# Patient Record
Sex: Female | Born: 1950 | Race: White | Hispanic: No | Smoking: Never smoker
Health system: Southern US, Community
[De-identification: ages and names within clinical notes are randomized; demographics above are authoritative.]

## PROBLEM LIST (undated history)

## (undated) DIAGNOSIS — I10 Essential (primary) hypertension: Secondary | ICD-10-CM

## (undated) DIAGNOSIS — J45909 Unspecified asthma, uncomplicated: Secondary | ICD-10-CM

## (undated) DIAGNOSIS — M1990 Unspecified osteoarthritis, unspecified site: Secondary | ICD-10-CM

## (undated) DIAGNOSIS — E119 Type 2 diabetes mellitus without complications: Secondary | ICD-10-CM

## (undated) DIAGNOSIS — D649 Anemia, unspecified: Secondary | ICD-10-CM

## (undated) DIAGNOSIS — Z86018 Personal history of other benign neoplasm: Secondary | ICD-10-CM

## (undated) DIAGNOSIS — G629 Polyneuropathy, unspecified: Secondary | ICD-10-CM

## (undated) DIAGNOSIS — I700 Atherosclerosis of aorta: Secondary | ICD-10-CM

## (undated) DIAGNOSIS — C801 Malignant (primary) neoplasm, unspecified: Secondary | ICD-10-CM

## (undated) DIAGNOSIS — K5790 Diverticulosis of intestine, part unspecified, without perforation or abscess without bleeding: Secondary | ICD-10-CM

## (undated) DIAGNOSIS — E782 Mixed hyperlipidemia: Secondary | ICD-10-CM

## (undated) DIAGNOSIS — K649 Unspecified hemorrhoids: Secondary | ICD-10-CM

## (undated) DIAGNOSIS — Z8719 Personal history of other diseases of the digestive system: Secondary | ICD-10-CM

## (undated) DIAGNOSIS — C541 Malignant neoplasm of endometrium: Secondary | ICD-10-CM

## (undated) DIAGNOSIS — E538 Deficiency of other specified B group vitamins: Secondary | ICD-10-CM

## (undated) DIAGNOSIS — G4730 Sleep apnea, unspecified: Secondary | ICD-10-CM

## (undated) DIAGNOSIS — I2510 Atherosclerotic heart disease of native coronary artery without angina pectoris: Secondary | ICD-10-CM

## (undated) HISTORY — DX: Personal history of other benign neoplasm: Z86.018

## (undated) HISTORY — PX: COLONOSCOPY: SHX174

## (undated) HISTORY — PX: ESOPHAGOGASTRODUODENOSCOPY: SHX1529

## (undated) HISTORY — PX: APPENDECTOMY: SHX54

## (undated) MED FILL — Fosaprepitant Dimeglumine For IV Infusion 150 MG (Base Eq): INTRAVENOUS | Qty: 5 | Status: AC

## (undated) MED FILL — Dexamethasone Sodium Phosphate Inj 100 MG/10ML: INTRAMUSCULAR | Qty: 1 | Status: AC

---
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
## 2004-05-09 ENCOUNTER — Ambulatory Visit: Payer: Self-pay

## 2004-11-04 ENCOUNTER — Encounter: Payer: Self-pay | Admitting: Physician Assistant

## 2004-11-15 ENCOUNTER — Encounter: Payer: Self-pay | Admitting: Physician Assistant

## 2004-12-15 ENCOUNTER — Encounter: Payer: Self-pay | Admitting: Physician Assistant

## 2005-01-15 ENCOUNTER — Encounter: Payer: Self-pay | Admitting: Physician Assistant

## 2005-02-26 ENCOUNTER — Ambulatory Visit: Payer: Self-pay | Admitting: Unknown Physician Specialty

## 2005-06-06 ENCOUNTER — Ambulatory Visit: Payer: Self-pay

## 2006-04-10 ENCOUNTER — Ambulatory Visit: Payer: Self-pay

## 2007-06-04 ENCOUNTER — Ambulatory Visit: Payer: Self-pay

## 2008-10-15 ENCOUNTER — Ambulatory Visit: Payer: Self-pay | Admitting: Internal Medicine

## 2008-11-03 ENCOUNTER — Ambulatory Visit: Payer: Self-pay | Admitting: Internal Medicine

## 2008-11-15 ENCOUNTER — Ambulatory Visit: Payer: Self-pay | Admitting: Internal Medicine

## 2008-12-15 ENCOUNTER — Ambulatory Visit: Payer: Self-pay | Admitting: Internal Medicine

## 2008-12-20 ENCOUNTER — Ambulatory Visit: Payer: Self-pay | Admitting: Urology

## 2009-02-14 ENCOUNTER — Ambulatory Visit: Payer: Self-pay | Admitting: Internal Medicine

## 2009-02-15 ENCOUNTER — Ambulatory Visit: Payer: Self-pay | Admitting: Internal Medicine

## 2009-03-17 ENCOUNTER — Ambulatory Visit: Payer: Self-pay | Admitting: Internal Medicine

## 2009-04-06 ENCOUNTER — Ambulatory Visit: Payer: Self-pay | Admitting: Unknown Physician Specialty

## 2009-04-17 ENCOUNTER — Ambulatory Visit: Payer: Self-pay | Admitting: Internal Medicine

## 2009-05-15 ENCOUNTER — Ambulatory Visit: Payer: Self-pay | Admitting: Internal Medicine

## 2009-06-15 ENCOUNTER — Ambulatory Visit: Payer: Self-pay | Admitting: Internal Medicine

## 2009-07-15 ENCOUNTER — Ambulatory Visit: Payer: Self-pay | Admitting: Internal Medicine

## 2009-09-14 ENCOUNTER — Ambulatory Visit: Payer: Self-pay | Admitting: Internal Medicine

## 2009-10-02 ENCOUNTER — Ambulatory Visit: Payer: Self-pay | Admitting: Internal Medicine

## 2009-10-15 ENCOUNTER — Ambulatory Visit: Payer: Self-pay | Admitting: Internal Medicine

## 2010-01-08 ENCOUNTER — Ambulatory Visit: Payer: Self-pay | Admitting: Internal Medicine

## 2010-01-15 ENCOUNTER — Ambulatory Visit: Payer: Self-pay | Admitting: Internal Medicine

## 2010-02-14 ENCOUNTER — Ambulatory Visit: Payer: Self-pay | Admitting: Internal Medicine

## 2010-04-09 ENCOUNTER — Ambulatory Visit: Payer: Self-pay | Admitting: Internal Medicine

## 2010-04-17 ENCOUNTER — Ambulatory Visit: Payer: Self-pay | Admitting: Internal Medicine

## 2010-07-09 ENCOUNTER — Ambulatory Visit: Payer: Self-pay | Admitting: Internal Medicine

## 2010-07-16 DIAGNOSIS — Z86018 Personal history of other benign neoplasm: Secondary | ICD-10-CM

## 2010-07-16 HISTORY — DX: Personal history of other benign neoplasm: Z86.018

## 2010-10-08 ENCOUNTER — Ambulatory Visit: Payer: Self-pay | Admitting: Internal Medicine

## 2010-10-16 ENCOUNTER — Ambulatory Visit: Payer: Self-pay | Admitting: Internal Medicine

## 2010-12-31 ENCOUNTER — Ambulatory Visit: Payer: Self-pay | Admitting: Internal Medicine

## 2011-01-16 ENCOUNTER — Ambulatory Visit: Payer: Self-pay | Admitting: Internal Medicine

## 2011-03-25 ENCOUNTER — Ambulatory Visit: Payer: Self-pay | Admitting: Internal Medicine

## 2011-03-25 LAB — CBC CANCER CENTER
Basophil %: 0.5 %
Eosinophil #: 0.3 x10 3/mm (ref 0.0–0.7)
Eosinophil %: 3.4 %
HCT: 36.4 % (ref 35.0–47.0)
Lymphocyte #: 3.3 x10 3/mm (ref 1.0–3.6)
Lymphocyte %: 32.7 %
MCV: 88 fL (ref 80–100)
Monocyte #: 0.7 x10 3/mm (ref 0.0–0.7)
Monocyte %: 6.7 %
Neutrophil #: 5.7 x10 3/mm (ref 1.4–6.5)
Neutrophil %: 56.7 %
RBC: 4.15 10*6/uL (ref 3.80–5.20)
WBC: 10 x10 3/mm (ref 3.6–11.0)

## 2011-03-25 LAB — IRON AND TIBC
Iron Saturation: 24 %
Unbound Iron-Bind.Cap.: 295 ug/dL

## 2011-04-18 ENCOUNTER — Ambulatory Visit: Payer: Self-pay | Admitting: Internal Medicine

## 2011-09-23 ENCOUNTER — Ambulatory Visit: Payer: Self-pay | Admitting: Internal Medicine

## 2011-09-23 LAB — CBC CANCER CENTER
Basophil #: 0 x10 3/mm (ref 0.0–0.1)
Basophil %: 0.5 %
Eosinophil %: 4.9 %
HCT: 37 % (ref 35.0–47.0)
HGB: 11.8 g/dL — ABNORMAL LOW (ref 12.0–16.0)
Lymphocyte #: 2.6 x10 3/mm (ref 1.0–3.6)
Lymphocyte %: 27.8 %
MCH: 28.7 pg (ref 26.0–34.0)
MCV: 90 fL (ref 80–100)
Monocyte #: 0.7 x10 3/mm (ref 0.2–0.9)
Monocyte %: 7.9 %
Neutrophil #: 5.6 x10 3/mm (ref 1.4–6.5)
Platelet: 286 x10 3/mm (ref 150–440)
RBC: 4.12 10*6/uL (ref 3.80–5.20)
RDW: 13.2 % (ref 11.5–14.5)
WBC: 9.4 x10 3/mm (ref 3.6–11.0)

## 2011-10-16 ENCOUNTER — Ambulatory Visit: Payer: Self-pay | Admitting: Internal Medicine

## 2012-01-09 ENCOUNTER — Ambulatory Visit: Payer: Self-pay | Admitting: Internal Medicine

## 2012-01-09 LAB — CANCER CENTER HEMOGLOBIN: HGB: 11.8 g/dL — ABNORMAL LOW (ref 12.0–16.0)

## 2012-01-09 LAB — IRON AND TIBC: Iron Saturation: 21 %

## 2012-01-09 LAB — FERRITIN: Ferritin (ARMC): 84 ng/mL (ref 8–388)

## 2012-01-16 ENCOUNTER — Ambulatory Visit: Payer: Self-pay | Admitting: Internal Medicine

## 2012-03-09 ENCOUNTER — Ambulatory Visit: Payer: Self-pay | Admitting: Internal Medicine

## 2012-03-17 ENCOUNTER — Ambulatory Visit: Payer: Self-pay | Admitting: Internal Medicine

## 2012-05-15 ENCOUNTER — Ambulatory Visit: Payer: Self-pay | Admitting: Internal Medicine

## 2012-06-08 LAB — CBC CANCER CENTER
Basophil #: 0.1 x10 3/mm (ref 0.0–0.1)
Basophil %: 0.7 %
Eosinophil #: 0.4 x10 3/mm (ref 0.0–0.7)
Eosinophil %: 4.3 %
Lymphocyte #: 3.2 x10 3/mm (ref 1.0–3.6)
Lymphocyte %: 30.9 %
MCH: 29.5 pg (ref 26.0–34.0)
MCHC: 33.7 g/dL (ref 32.0–36.0)
MCV: 88 fL (ref 80–100)
Monocyte #: 0.7 x10 3/mm (ref 0.2–0.9)
Monocyte %: 7.1 %
Neutrophil #: 5.9 x10 3/mm (ref 1.4–6.5)
Platelet: 310 x10 3/mm (ref 150–440)
RDW: 13.4 % (ref 11.5–14.5)
WBC: 10.4 x10 3/mm (ref 3.6–11.0)

## 2012-06-08 LAB — IRON AND TIBC
Iron Bind.Cap.(Total): 403 ug/dL (ref 250–450)
Unbound Iron-Bind.Cap.: 319 ug/dL

## 2012-06-08 LAB — FERRITIN: Ferritin (ARMC): 68 ng/mL (ref 8–388)

## 2012-06-15 ENCOUNTER — Ambulatory Visit: Payer: Self-pay | Admitting: Internal Medicine

## 2014-12-16 DEATH — deceased

## 2015-02-22 ENCOUNTER — Other Ambulatory Visit: Payer: Self-pay | Admitting: Internal Medicine

## 2015-02-22 DIAGNOSIS — N644 Mastodynia: Secondary | ICD-10-CM

## 2015-03-07 ENCOUNTER — Other Ambulatory Visit: Payer: Self-pay | Admitting: Internal Medicine

## 2015-03-07 ENCOUNTER — Ambulatory Visit
Admission: RE | Admit: 2015-03-07 | Discharge: 2015-03-07 | Disposition: A | Discharge status: Home / Self Care | Payer: BC Managed Care – PPO | Source: Ambulatory Visit | Attending: Internal Medicine | Admitting: Internal Medicine

## 2015-03-07 DIAGNOSIS — N644 Mastodynia: Secondary | ICD-10-CM

## 2015-10-16 ENCOUNTER — Other Ambulatory Visit: Payer: Self-pay | Admitting: Internal Medicine

## 2015-10-16 ENCOUNTER — Ambulatory Visit
Admission: RE | Admit: 2015-10-16 | Discharge: 2015-10-16 | Disposition: A | Discharge status: Home / Self Care | Payer: BC Managed Care – PPO | Source: Ambulatory Visit | Attending: Internal Medicine | Admitting: Internal Medicine

## 2015-10-16 DIAGNOSIS — R1011 Right upper quadrant pain: Secondary | ICD-10-CM | POA: Insufficient documentation

## 2015-10-16 DIAGNOSIS — K760 Fatty (change of) liver, not elsewhere classified: Secondary | ICD-10-CM | POA: Insufficient documentation

## 2016-06-03 ENCOUNTER — Other Ambulatory Visit: Payer: Self-pay | Admitting: Internal Medicine

## 2016-06-03 DIAGNOSIS — Z1231 Encounter for screening mammogram for malignant neoplasm of breast: Secondary | ICD-10-CM

## 2016-07-02 ENCOUNTER — Ambulatory Visit
Admission: RE | Admit: 2016-07-02 | Discharge: 2016-07-02 | Disposition: A | Discharge status: Home / Self Care | Payer: BC Managed Care – PPO | Source: Ambulatory Visit | Attending: Internal Medicine | Admitting: Internal Medicine

## 2016-07-02 DIAGNOSIS — Z1231 Encounter for screening mammogram for malignant neoplasm of breast: Secondary | ICD-10-CM | POA: Diagnosis not present

## 2017-10-08 ENCOUNTER — Other Ambulatory Visit: Payer: Self-pay | Admitting: Internal Medicine

## 2017-10-08 DIAGNOSIS — Z1231 Encounter for screening mammogram for malignant neoplasm of breast: Secondary | ICD-10-CM

## 2017-10-28 ENCOUNTER — Ambulatory Visit
Admission: RE | Admit: 2017-10-28 | Discharge: 2017-10-28 | Disposition: A | Discharge status: Home / Self Care | Payer: Medicare Other | Source: Ambulatory Visit | Attending: Internal Medicine | Admitting: Internal Medicine

## 2017-10-28 DIAGNOSIS — Z1231 Encounter for screening mammogram for malignant neoplasm of breast: Secondary | ICD-10-CM | POA: Diagnosis present

## 2018-01-18 ENCOUNTER — Other Ambulatory Visit: Payer: Self-pay | Admitting: Internal Medicine

## 2018-01-18 DIAGNOSIS — M5441 Lumbago with sciatica, right side: Secondary | ICD-10-CM

## 2018-01-18 DIAGNOSIS — M5416 Radiculopathy, lumbar region: Secondary | ICD-10-CM

## 2018-01-18 DIAGNOSIS — M25552 Pain in left hip: Secondary | ICD-10-CM

## 2018-01-18 DIAGNOSIS — M5442 Lumbago with sciatica, left side: Secondary | ICD-10-CM

## 2018-01-21 ENCOUNTER — Other Ambulatory Visit: Payer: Self-pay | Admitting: Internal Medicine

## 2018-01-21 DIAGNOSIS — M5442 Lumbago with sciatica, left side: Secondary | ICD-10-CM

## 2018-01-21 DIAGNOSIS — M5441 Lumbago with sciatica, right side: Secondary | ICD-10-CM

## 2018-01-21 DIAGNOSIS — M25552 Pain in left hip: Secondary | ICD-10-CM

## 2018-01-21 DIAGNOSIS — M5416 Radiculopathy, lumbar region: Secondary | ICD-10-CM

## 2018-02-09 ENCOUNTER — Ambulatory Visit
Admission: RE | Admit: 2018-02-09 | Discharge: 2018-02-09 | Disposition: A | Discharge status: Home / Self Care | Payer: Medicare Other | Source: Ambulatory Visit | Attending: Internal Medicine | Admitting: Internal Medicine

## 2018-02-09 ENCOUNTER — Encounter (INDEPENDENT_AMBULATORY_CARE_PROVIDER_SITE_OTHER): Payer: Self-pay

## 2018-02-09 DIAGNOSIS — M5126 Other intervertebral disc displacement, lumbar region: Secondary | ICD-10-CM | POA: Insufficient documentation

## 2018-02-09 DIAGNOSIS — M48061 Spinal stenosis, lumbar region without neurogenic claudication: Secondary | ICD-10-CM | POA: Insufficient documentation

## 2018-02-09 DIAGNOSIS — M5441 Lumbago with sciatica, right side: Secondary | ICD-10-CM

## 2018-02-09 DIAGNOSIS — M5442 Lumbago with sciatica, left side: Secondary | ICD-10-CM

## 2018-02-09 DIAGNOSIS — M5416 Radiculopathy, lumbar region: Secondary | ICD-10-CM

## 2018-02-09 DIAGNOSIS — M25552 Pain in left hip: Secondary | ICD-10-CM

## 2018-02-09 HISTORY — DX: Essential (primary) hypertension: I10

## 2018-02-09 HISTORY — DX: Type 2 diabetes mellitus without complications: E11.9

## 2018-02-09 MED ORDER — GADOBUTROL 1 MMOL/ML IV SOLN
10.0000 mL | Freq: Once | INTRAVENOUS | Status: AC | PRN
  Administered 2018-02-09: 10 mL via INTRAVENOUS

## 2018-12-13 ENCOUNTER — Other Ambulatory Visit: Payer: Self-pay | Admitting: Internal Medicine

## 2018-12-13 DIAGNOSIS — Z1231 Encounter for screening mammogram for malignant neoplasm of breast: Secondary | ICD-10-CM

## 2018-12-17 ENCOUNTER — Other Ambulatory Visit: Payer: Self-pay | Admitting: Internal Medicine

## 2018-12-17 DIAGNOSIS — N644 Mastodynia: Secondary | ICD-10-CM

## 2018-12-21 ENCOUNTER — Other Ambulatory Visit: Payer: Self-pay | Admitting: Internal Medicine

## 2018-12-21 DIAGNOSIS — N644 Mastodynia: Secondary | ICD-10-CM

## 2018-12-21 DIAGNOSIS — Z1231 Encounter for screening mammogram for malignant neoplasm of breast: Secondary | ICD-10-CM

## 2018-12-24 ENCOUNTER — Ambulatory Visit
Admission: RE | Admit: 2018-12-24 | Discharge: 2018-12-24 | Disposition: A | Discharge status: Home / Self Care | Payer: Medicare Other | Source: Ambulatory Visit | Attending: Internal Medicine | Admitting: Internal Medicine

## 2018-12-24 DIAGNOSIS — N644 Mastodynia: Secondary | ICD-10-CM

## 2018-12-24 DIAGNOSIS — Z1231 Encounter for screening mammogram for malignant neoplasm of breast: Secondary | ICD-10-CM | POA: Diagnosis present

## 2019-11-30 ENCOUNTER — Ambulatory Visit: Payer: Medicare PPO | Admitting: Dermatology

## 2019-11-30 ENCOUNTER — Other Ambulatory Visit: Payer: Self-pay

## 2019-11-30 ENCOUNTER — Encounter: Payer: Self-pay | Admitting: Dermatology

## 2019-11-30 DIAGNOSIS — L578 Other skin changes due to chronic exposure to nonionizing radiation: Secondary | ICD-10-CM | POA: Diagnosis not present

## 2019-11-30 DIAGNOSIS — L820 Inflamed seborrheic keratosis: Secondary | ICD-10-CM | POA: Diagnosis not present

## 2019-11-30 DIAGNOSIS — L821 Other seborrheic keratosis: Secondary | ICD-10-CM

## 2019-11-30 DIAGNOSIS — D229 Melanocytic nevi, unspecified: Secondary | ICD-10-CM | POA: Diagnosis not present

## 2019-11-30 NOTE — Progress Notes (Signed)
   Follow-Up Visit   Subjective  Valerie Case is a 69 y.o. female who presents for the following: check spot (back, ~89m, tender). Other spots to be checked too.  The following portions of the chart were reviewed this encounter and updated as appropriate:  Allergies  Meds  Problems  Med Hx  Surg Hx  Fam Hx     Review of Systems:  No other skin or systemic complaints except as noted in HPI or Assessment and Plan.  Objective  Well appearing patient in no apparent distress; mood and affect are within normal limits.  A focused examination was performed including back. Relevant physical exam findings are noted in the Assessment and Plan.  Objective  L back x 1: Erythematous keratotic or waxy stuck-on papule or plaque.    Assessment & Plan    Melanocytic Nevi - Tan-brown and/or pink-flesh-colored symmetric macules and papules - Benign appearing on exam today - Observation - Call clinic for new or changing moles - Recommend daily use of broad spectrum spf 30+ sunscreen to sun-exposed areas.   Seborrheic Keratoses - Stuck-on, waxy, tan-brown papules and plaques  - Discussed benign etiology and prognosis. - Observe - Call for any changes  Actinic Damage - diffuse scaly erythematous macules with underlying dyspigmentation - Recommend daily broad spectrum sunscreen SPF 30+ to sun-exposed areas, reapply every 2 hours as needed.  - Call for new or changing lesions.  Inflamed seborrheic keratosis L back x 1  Destruction of lesion - L back x 1 Complexity: simple   Destruction method: cryotherapy   Informed consent: discussed and consent obtained   Timeout:  patient name, date of birth, surgical site, and procedure verified Lesion destroyed using liquid nitrogen: Yes   Region frozen until ice ball extended beyond lesion: Yes   Outcome: patient tolerated procedure well with no complications   Post-procedure details: wound care instructions given    Destruction of  lesion - L back x 1   Return in about 1 year (around 11/29/2020) for TBSE.   I, Othelia Pulling, RMA, am acting as scribe for Sarina Ser, MD .  Documentation: I have reviewed the above documentation for accuracy and completeness, and I agree with the above.  Sarina Ser, MD

## 2019-11-30 NOTE — Patient Instructions (Signed)

## 2019-12-26 ENCOUNTER — Other Ambulatory Visit: Payer: Self-pay

## 2019-12-26 ENCOUNTER — Ambulatory Visit
Admission: RE | Admit: 2019-12-26 | Discharge: 2019-12-26 | Disposition: A | Discharge status: Home / Self Care | Payer: Medicare PPO | Source: Ambulatory Visit | Attending: Internal Medicine | Admitting: Internal Medicine

## 2019-12-26 ENCOUNTER — Other Ambulatory Visit (HOSPITAL_COMMUNITY): Payer: Self-pay | Admitting: Internal Medicine

## 2019-12-26 ENCOUNTER — Other Ambulatory Visit: Payer: Self-pay | Admitting: Internal Medicine

## 2019-12-26 ENCOUNTER — Ambulatory Visit: Admission: RE | Admit: 2019-12-26 | Payer: Medicare PPO | Source: Ambulatory Visit

## 2019-12-26 DIAGNOSIS — M79605 Pain in left leg: Secondary | ICD-10-CM | POA: Diagnosis present

## 2019-12-26 DIAGNOSIS — M7989 Other specified soft tissue disorders: Secondary | ICD-10-CM

## 2020-02-22 ENCOUNTER — Ambulatory Visit (INDEPENDENT_AMBULATORY_CARE_PROVIDER_SITE_OTHER): Payer: Medicare PPO

## 2020-02-22 ENCOUNTER — Ambulatory Visit
Admission: EM | Admit: 2020-02-22 | Discharge: 2020-02-22 | Disposition: A | Discharge status: Home / Self Care | Payer: Medicare PPO | Attending: Family Medicine | Admitting: Family Medicine

## 2020-02-22 ENCOUNTER — Other Ambulatory Visit: Payer: Self-pay

## 2020-02-22 ENCOUNTER — Encounter: Payer: Self-pay | Admitting: Emergency Medicine

## 2020-02-22 DIAGNOSIS — W19XXXA Unspecified fall, initial encounter: Secondary | ICD-10-CM

## 2020-02-22 DIAGNOSIS — M25512 Pain in left shoulder: Secondary | ICD-10-CM

## 2020-02-22 HISTORY — DX: Unspecified asthma, uncomplicated: J45.909

## 2020-02-22 MED ORDER — TRAMADOL HCL 50 MG PO TABS: 50.0000 mg | ORAL_TABLET | Freq: Three times a day (TID) | ORAL | 0 refills | Status: DC | PRN

## 2020-02-22 NOTE — ED Provider Notes (Signed)
[facility]    CSN: [id number] Arrival date & time: 02/22/20  1126  History   Chief Complaint Chief Complaint  Patient presents with  . Fall  . Shoulder Pain    left   HPI   69 year old female presents with the above complaint.  Patient states that she was coming back into the house after getting the mail.  She states that she somehow tripped on hardwood floor.  She fell on her left side and injured her left shoulder.  She reports 9/10 pain left shoulder.  Described as aching.  No relieving factors.  Exacerbated by palpation and activity/range of motion.  No medications tried.  She is currently in a makeshift sling.  Past Medical History:  Diagnosis Date  . Asthma   . Diabetes mellitus without complication ([place])   . Hx of dysplastic nevus 07/16/2010   RLQA  . Hypertension    Home Medications    Prior to Admission medications   Medication Sig Start Date End Date Taking? Authorizing Provider  acetaminophen (TYLENOL) 650 MG CR tablet Take by mouth.   Yes [provider]  empagliflozin (JARDIANCE) 25 MG TABS tablet Take 1 tablet by mouth daily. 09/06/19  Yes [provider]  ferrous sulfate 325 (65 FE) MG EC tablet TAKE 1 TABLET BY MOUTH EVERY DAY WITH BREAKFAST 08/29/19  Yes [provider]  FLUoxetine (PROZAC) 10 MG capsule  10/03/19  Yes [provider]  hydrochlorothiazide (HYDRODIURIL) 25 MG tablet  07/27/19  Yes [provider]  lisinopril (ZESTRIL) 40 MG tablet  09/26/19  Yes [provider]  metFORMIN (GLUCOPHAGE-XR) 500 MG 24 hr tablet  11/21/19  Yes [provider]  metoprolol succinate (TOPROL-XL) 25 MG 24 hr tablet  09/22/19  Yes [provider]  NOVOLIN 70/30 FLEXPEN (70-30) 100 UNIT/ML KwikPen  11/11/19  Yes [provider]  [place], 1 MG/DOSE, 4 MG/3ML SOPN  09/27/19  Yes [provider]  rosuvastatin (CRESTOR) 10 MG tablet  09/26/19  Yes [provider]  traMADol  (ULTRAM) 50 MG tablet Take 1 tablet (50 mg total) by mouth every 8 (eight) hours as needed. 02/22/20   Coral Spikes, DO    Family History Family History  Problem Relation Age of Onset  . Breast cancer Mother 34  . Cancer Mother   . Cancer Father     Social History Social History   Tobacco Use  . Smoking status: Never Smoker  . Smokeless tobacco: Never Used  Vaping Use  . Vaping Use: Never used  Substance Use Topics  . Alcohol use: Never  . Drug use: Never     Allergies   Aspirin   Review of Systems Review of Systems  Musculoskeletal:       Left shoulder pain.   Physical Exam Triage Vital Signs ED Triage Vitals  Enc Vitals Group     BP 02/22/20 1235 140/64     Pulse Rate 02/22/20 1235 85     Resp 02/22/20 1235 18     Temp 02/22/20 1235 98.2 F (36.8 C)     Temp Source 02/22/20 1235 Oral     SpO2 02/22/20 1235 98 %     Weight 02/22/20 1233 300 lb (136.1 kg)     Height 02/22/20 1233 5\' 7"  (1.702 m)     Head Circumference --      Peak Flow --      Pain Score 02/22/20 1232 9     Pain Loc --  Pain Edu? --      Excl. in [place]? --    Updated Vital Signs BP 140/64 (BP Location: Right Arm)   Pulse 85   Temp 98.2 F (36.8 C) (Oral)   Resp 18   Ht 5\' 7"  (1.702 m)   Wt 136.1 kg   SpO2 98%   BMI 46.99 kg/m   Visual Acuity Right Eye Distance:   Left Eye Distance:   Bilateral Distance:    Right Eye Near:   Left Eye Near:    Bilateral Near:     Physical Exam Vitals and nursing note reviewed.  Constitutional:      General: She is not in acute distress.    Appearance: She is obese. She is not ill-appearing.  HENT:     Head: Normocephalic and atraumatic.  Cardiovascular:     Rate and Rhythm: Normal rate and regular rhythm.     Heart sounds: Murmur heard.   Pulmonary:     Effort: Pulmonary effort is normal.     Breath sounds: Normal breath sounds. No wheezing, rhonchi or rales.  Musculoskeletal:     Comments: Left shoulder - tenderness to  palpation diffusely.   Neurological:     Mental Status: She is alert.  Psychiatric:        Mood and Affect: Mood normal.        Behavior: Behavior normal.    UC Treatments / Results  Labs (all labs ordered are listed, but only abnormal results are displayed) Labs Reviewed - No data to display  EKG   Radiology DG Shoulder Left  Result Date: 02/22/2020 CLINICAL DATA:  Left shoulder pain after fall today. EXAM: LEFT SHOULDER - 2+ VIEW COMPARISON:  None. FINDINGS: There is no evidence of fracture or dislocation. There is no evidence of arthropathy or other focal bone abnormality. Soft tissues are unremarkable. IMPRESSION: Negative. Electronically Signed   By: Marijo Conception M.D.   On: 02/22/2020 13:45    Procedures Procedures (including critical care time)  Medications Ordered in UC Medications - No data to display  Initial Impression / Assessment and Plan / UC Course  I have reviewed the triage vital signs and the nursing notes.  Pertinent labs & imaging results that were available during my care of the patient were reviewed by me and considered in my medical decision making (see chart for details).    69 year old female presents with right shoulder pain.  Patient suffered a fall today.  X-ray was obtained and was independent reviewed by me.  Interpretation: Normal x-ray.  No evidence of fracture.  Advised rest, ice.  Gentle range of motion.  Tramadol as needed for pain.  Supportive care.  Final Clinical Impressions(s) / UC Diagnoses   Final diagnoses:  Acute pain of left shoulder     Discharge Instructions     Rest, ice.   Medication as needed.  Gentle range of motion.  Take care  Dr. Lacinda Axon    ED Prescriptions    Medication Sig Dispense Auth. Provider   traMADol (ULTRAM) 50 MG tablet Take 1 tablet (50 mg total) by mouth every 8 (eight) hours as needed. 10 tablet Thersa Salt G, DO     I have reviewed the PDMP during this encounter.   Coral Spikes,  [place] 02/22/20 1902

## 2020-02-22 NOTE — ED Triage Notes (Signed)
Patient states she fell this morning. She states she tripped and fell on her left side. She is c/o left shoulder pain.

## 2020-02-22 NOTE — Discharge Instructions (Signed)
Rest, ice.   Medication as needed.  Gentle range of motion.  Take care  Dr. Lacinda Axon

## 2020-04-30 ENCOUNTER — Other Ambulatory Visit: Payer: Self-pay | Admitting: Internal Medicine

## 2020-04-30 DIAGNOSIS — Z1231 Encounter for screening mammogram for malignant neoplasm of breast: Secondary | ICD-10-CM

## 2020-11-29 ENCOUNTER — Encounter: Payer: Medicare PPO | Admitting: Dermatology

## 2020-12-07 ENCOUNTER — Ambulatory Visit (INDEPENDENT_AMBULATORY_CARE_PROVIDER_SITE_OTHER): Payer: Medicare PPO

## 2020-12-07 ENCOUNTER — Encounter: Payer: Self-pay | Admitting: Emergency Medicine

## 2020-12-07 ENCOUNTER — Other Ambulatory Visit: Payer: Self-pay

## 2020-12-07 ENCOUNTER — Ambulatory Visit
Admission: EM | Admit: 2020-12-07 | Discharge: 2020-12-07 | Disposition: A | Discharge status: Home / Self Care | Payer: Medicare PPO | Attending: Family Medicine | Admitting: Family Medicine

## 2020-12-07 DIAGNOSIS — R109 Unspecified abdominal pain: Secondary | ICD-10-CM | POA: Insufficient documentation

## 2020-12-07 DIAGNOSIS — R319 Hematuria, unspecified: Secondary | ICD-10-CM | POA: Diagnosis present

## 2020-12-07 LAB — CBC WITH DIFFERENTIAL/PLATELET
Abs Immature Granulocytes: 0.03 10*3/uL (ref 0.00–0.07)
Basophils Absolute: 0.1 10*3/uL (ref 0.0–0.1)
Basophils Relative: 1 %
Eosinophils Absolute: 0.3 10*3/uL (ref 0.0–0.5)
Eosinophils Relative: 4 %
HCT: 38.6 % (ref 36.0–46.0)
Hemoglobin: 12.6 g/dL (ref 12.0–15.0)
Immature Granulocytes: 0 %
Lymphocytes Relative: 32 %
Lymphs Abs: 2.7 10*3/uL (ref 0.7–4.0)
MCH: 29.1 pg (ref 26.0–34.0)
MCHC: 32.6 g/dL (ref 30.0–36.0)
MCV: 89.1 fL (ref 80.0–100.0)
Monocytes Absolute: 0.7 10*3/uL (ref 0.1–1.0)
Monocytes Relative: 9 %
Neutro Abs: 4.6 10*3/uL (ref 1.7–7.7)
Neutrophils Relative %: 54 %
Platelets: 297 10*3/uL (ref 150–400)
RBC: 4.33 MIL/uL (ref 3.87–5.11)
RDW: 13.4 % (ref 11.5–15.5)
WBC: 8.5 10*3/uL (ref 4.0–10.5)
nRBC: 0 % (ref 0.0–0.2)

## 2020-12-07 LAB — COMPREHENSIVE METABOLIC PANEL
ALT: 18 U/L (ref 0–44)
AST: 22 U/L (ref 15–41)
Albumin: 4.2 g/dL (ref 3.5–5.0)
Alkaline Phosphatase: 54 U/L (ref 38–126)
Anion gap: 10 (ref 5–15)
BUN: 25 mg/dL — ABNORMAL HIGH (ref 8–23)
CO2: 21 mmol/L — ABNORMAL LOW (ref 22–32)
Calcium: 9.7 mg/dL (ref 8.9–10.3)
Chloride: 104 mmol/L (ref 98–111)
Creatinine, Ser: 0.9 mg/dL (ref 0.44–1.00)
GFR, Estimated: >60 mL/min (ref >60)
Glucose, Bld: 133 mg/dL — ABNORMAL HIGH (ref 70–99)
Potassium: 4.2 mmol/L (ref 3.5–5.1)
Sodium: 135 mmol/L (ref 135–145)
Total Bilirubin: 0.5 mg/dL (ref 0.3–1.2)
Total Protein: 7.8 g/dL (ref 6.5–8.1)

## 2020-12-07 LAB — URINALYSIS, COMPLETE (UACMP) WITH MICROSCOPIC
Bilirubin Urine: NEGATIVE
Glucose, UA: 500 mg/dL — AB
Ketones, ur: NEGATIVE mg/dL
Leukocytes,Ua: NEGATIVE
Nitrite: NEGATIVE
Protein, ur: NEGATIVE mg/dL
RBC / HPF: >50 RBC/hpf (ref 0–5)
Specific Gravity, Urine: 1.01 (ref 1.005–1.030)
pH: 5.5 (ref 5.0–8.0)

## 2020-12-07 MED ORDER — TRAMADOL HCL 50 MG PO TABS: 50.0000 mg | ORAL_TABLET | Freq: Four times a day (QID) | ORAL | 0 refills | Status: DC | PRN

## 2020-12-07 NOTE — ED Triage Notes (Signed)
Patient c/o lower abdominal and lower back pain that started yesterday.  Patient reports blood in her urine.  Patient also thinks she might be constipated.  Patient states that she passed a blood clot about a hour ago.

## 2020-12-07 NOTE — ED Provider Notes (Signed)
[facility]    CSN: [id number] Arrival date & time: 12/07/20  1545      History   Chief Complaint Chief Complaint  Patient presents with   Dysuria    HPI Valerie Case is a 70 y.o. female.   HPI  70 year old female here for evaluation of hematuria.  Patient reports that she has been experiencing lower abdomen and low back pain since August.  She is also been experiencing blood in her urine.  She reports that today she passed a clot approximate hour before she came in.  She states that she feels like she also may be constipated.  She states that the last month and a half when she eats she becomes bloated and small meals lead to a sense of fullness and lower abdominal pain.  She also has some nausea but no vomiting.  She has not had any fever and she denies pain with urination.  Patient has no history of smoking but her parents were both smokers.  She is a diabetic on Jardiance.  She reports that since August she has been treated twice by her primary care doctor for urinary tract infections.  Her urinalysis from 10/31/20 showed few bacteria but no other signs of infection.  A culture was performed which grew out E. coli..  Patient has not had any imaging of her abdomen.  Patient also denies urinary urgency or frequency.  Past Medical History:  Diagnosis Date   Asthma    Diabetes mellitus without complication ([place])    Hx of dysplastic nevus 07/16/2010   RLQA   Hypertension     There are no problems to display for this patient.   History reviewed. No pertinent surgical history.  OB History   No obstetric history on file.      Home Medications    Prior to Admission medications   Medication Sig Start Date End Date Taking? Authorizing Provider  empagliflozin (JARDIANCE) 25 MG TABS tablet Take 1 tablet by mouth daily. 09/06/19  Yes [provider]  ferrous sulfate 325 (65 FE) MG EC tablet TAKE 1 TABLET BY MOUTH EVERY DAY WITH BREAKFAST 08/29/19  Yes  [provider]  FLUoxetine (PROZAC) 10 MG capsule  10/03/19  Yes [provider]  hydrochlorothiazide (HYDRODIURIL) 25 MG tablet  07/27/19  Yes [provider]  lisinopril (ZESTRIL) 40 MG tablet  09/26/19  Yes [provider]  metFORMIN (GLUCOPHAGE-XR) 500 MG 24 hr tablet  11/21/19  Yes [provider]  metoprolol succinate (TOPROL-XL) 25 MG 24 hr tablet  09/22/19  Yes [provider]  NOVOLIN 70/30 FLEXPEN (70-30) 100 UNIT/ML KwikPen  11/11/19  Yes [provider]  [place], 1 MG/DOSE, 4 MG/3ML SOPN  09/27/19  Yes [provider]  rosuvastatin (CRESTOR) 10 MG tablet  09/26/19  Yes [provider]  traMADol (ULTRAM) 50 MG tablet Take 1 tablet (50 mg total) by mouth every 6 (six) hours as needed. 12/07/20  Yes Margarette Canada, NP  acetaminophen (TYLENOL) 650 MG CR tablet Take by mouth.    [provider]    Family History Family History  Problem Relation Age of Onset   Breast cancer Mother 42   Cancer Mother    Cancer Father     Social History Social History   Tobacco Use   Smoking status: Never   Smokeless tobacco: Never  Vaping Use   Vaping Use: Never used  Substance Use Topics   Alcohol use: Never   Drug  use: Never     Allergies   Aspirin   Review of Systems Review of Systems  Constitutional:  Negative for activity change, appetite change and fever.  Gastrointestinal:  Positive for abdominal pain, constipation and nausea. Negative for diarrhea and vomiting.  Genitourinary:  Positive for hematuria. Negative for dysuria, frequency and urgency.  Musculoskeletal:  Positive for back pain.  Hematological: Negative.   Psychiatric/Behavioral: Negative.      Physical Exam Triage Vital Signs ED Triage Vitals  Enc Vitals Group     BP 12/07/20 1603 (!) 131/93     Pulse Rate 12/07/20 1603 84     Resp 12/07/20 1603 14     Temp 12/07/20 1603 98.5 F (36.9 C)     Temp Source 12/07/20 1603 Oral      SpO2 12/07/20 1603 98 %     Weight 12/07/20 1600 297 lb (134.7 kg)     Height 12/07/20 1600 5\' 7"  (1.702 m)     Head Circumference --      Peak Flow --      Pain Score --      Pain Loc --      Pain Edu? --      Excl. in [place]? --    No data found.  Updated Vital Signs BP (!) 131/93 (BP Location: Left Arm)   Pulse 84   Temp 98.5 F (36.9 C) (Oral)   Resp 14   Ht 5\' 7"  (1.702 m)   Wt 297 lb (134.7 kg)   SpO2 98%   BMI 46.52 kg/m   Visual Acuity Right Eye Distance:   Left Eye Distance:   Bilateral Distance:    Right Eye Near:   Left Eye Near:    Bilateral Near:     Physical Exam Vitals and nursing note reviewed.  Constitutional:      General: She is not in acute distress.    Appearance: Normal appearance. She is not ill-appearing.  HENT:     Head: Normocephalic and atraumatic.  Cardiovascular:     Rate and Rhythm: Normal rate and regular rhythm.     Pulses: Normal pulses.     Heart sounds: Normal heart sounds. No murmur heard.   No gallop.  Pulmonary:     Effort: Pulmonary effort is normal.     Breath sounds: No wheezing, rhonchi or rales.  Abdominal:     General: There is distension.     Palpations: Abdomen is soft.     Tenderness: There is abdominal tenderness. There is no right CVA tenderness, left CVA tenderness, guarding or rebound.  Skin:    General: Skin is warm and dry.     Capillary Refill: Capillary refill takes less than 2 seconds.     Coloration: Skin is not pale.     Findings: No erythema or rash.  Neurological:     General: No focal deficit present.     Mental Status: She is alert and oriented to person, place, and time.  Psychiatric:        Mood and Affect: Mood normal.        Behavior: Behavior normal.        Thought Content: Thought content normal.        Judgment: Judgment normal.     UC Treatments / Results  Labs (all labs ordered are listed, but only abnormal results are displayed) Labs Reviewed  URINALYSIS, COMPLETE (UACMP) WITH  MICROSCOPIC - Abnormal; Notable for the following components:  Result Value   Color, Urine AMBER (*)    APPearance HAZY (*)    Glucose, UA 500 (*)    Hgb urine dipstick LARGE (*)    Bacteria, UA FEW (*)    All other components within normal limits  COMPREHENSIVE METABOLIC PANEL - Abnormal; Notable for the following components:   CO2 21 (*)    Glucose, Bld 133 (*)    BUN 25 (*)    All other components within normal limits  URINE CULTURE  CBC WITH DIFFERENTIAL/PLATELET    EKG   Radiology DG Abd 2 Views  Result Date: 12/07/2020 CLINICAL DATA:  Lower abdominal pain EXAM: ABDOMEN - 2 VIEW COMPARISON:  None. FINDINGS: Nonobstructed gas pattern with moderate stool in the colon. No free air beneath the diaphragm. No radiopaque calculi. IMPRESSION: Negative. Electronically Signed   By: Donavan Foil M.D.   On: 12/07/2020 17:07    Procedures Procedures (including critical care time)  Medications Ordered in UC Medications - No data to display  Initial Impression / Assessment and Plan / UC Course  I have reviewed the triage vital signs and the nursing notes.  Pertinent labs & imaging results that were available during my care of the patient were reviewed by me and considered in my medical decision making (see chart for details).  Patient is a nontoxic-appearing 70 year old female here for evaluation of lower abdominal pain, low back pain, and hematuria that has been going on since the middle of August.  She states that it has been continuous.  She is also been experiencing abdominal bloating and states that eating, even small meals, produces a feeling of fullness, nausea, and abdominal distention.  It also increases the pain in both sides of her lower abdomen.  She denies any fever, pain with urination, urinary urgency or frequency, or vomiting.  She has been evaluated by her primary care doctor and is awaiting a referral to urology.  She does have an appointment scheduled for October.   She is not having imaging of her abdomen though.  Patient's physical exam reveals a benign cardiopulmonary exam with clear lung sounds in all fields.  Patient's conjunctiva are not pale.  Patient has no CVA tenderness on exam.  She does have some tenderness with palpation of her low back.  Abdomen is protuberant but soft with decreased bowel sounds in all quadrants.  Patient does have some mild tenderness to the left lower quadrant and right lower quadrant but no suprapubic tenderness.  Urinalysis collected at triage.  There is concern given her painless hematuria, and the duration of it, that there is another source other than infection.  Especially given that she has been treated with 2 rounds of antibiotics with no resolution of symptoms.  We will also check CBC, CMP, and abdominal flatplate and upright.  Urinalysis is amber in color and hazy in appearance.  Glucose 500, large hemoglobin, greater than 50 RBCs, few bacteria.  Negative for leukocyte esterase, nitrates, protein, ketones, bilirubin.  6-10 squamous epithelials with 6-10 white cells.  We will send urine for culture.  CBC is completely unremarkable.  CMP reveals a mildly elevated BUN of 25, creatinine is normal at 0.9, sodium potassium within normal limits, transaminases are normal.  Glucose is 133.  Flatplate and upright abdomen independently reviewed and evaluated by me.  Impression: There is scattered stool and nonspecific air-fluid levels present on both the flatplate and the upright.  No distinct evidence of constipation or obstruction.  On the erect abdomen  film there is questionable cluster of spherical anomalies in the pelvis.  Unclear if this is bowel gas or enlarged lymph nodes.  Radiology overread is pending. Radiology interpretation of 2 way abdomen is that is negative for obstruction or constipation.  Will discharge patient home with a diagnosis of painless hematuria and have her keep her follow-up appoint with urology as  scheduled.  We will give patient a prescription for tramadol to use as needed for severe abdominal pain.  There is no overt evidence of an infection on urinalysis and I will send sample for culture.  If the culture grows out E. coli I will advise the patient to discuss stopping Jardiance with her primary care provider as this may be the source of continued infection and may help resolve some of her symptoms.   Final Clinical Impressions(s) / UC Diagnoses   Final diagnoses:  Abdominal pain  Hematuria, unspecified type     Discharge Instructions      Your work-up today did not show the presence of anemia, impaired renal function, or urinary tract infection.  The x-ray did not demonstrate the presence of constipation.  Is unclear what is causing your abdominal discomfort as well as your hematuria.  You need to keep your follow-up with urology as scheduled.  I would suggest talking to your primary care doctor about obtaining a CT scan of your abdomen and pelvis in the interim to look for other pathology within your kidneys or your bladder which may be causing some of your symptoms.  Use the tramadol as needed for severe pain.  You can take this every 6 hours.  Do not drink alcohol or drive if you take this as it will make you drowsy.  We are going to send her urine for culture and if it grows out bacteria it may be worthwhile discussing stopping the Jardiance with your primary care provider.  The Jardiance causes you to excrete sugar through your urine and is a ready source of food for bacteria which could lead to infection or continuing infection.  If you develop any fever, sharp increase in abdominal pain, or an increase in your hematuria please return for reevaluation or go to the emergency department.     ED Prescriptions     Medication Sig Dispense Auth. Provider   traMADol (ULTRAM) 50 MG tablet Take 1 tablet (50 mg total) by mouth every 6 (six) hours as needed. 15 tablet Margarette Canada,  NP      I have reviewed the PDMP during this encounter.   Margarette Canada, NP 12/07/20 [phone]

## 2020-12-07 NOTE — Discharge Instructions (Addendum)
Your work-up today did not show the presence of anemia, impaired renal function, or urinary tract infection.  The x-ray did not demonstrate the presence of constipation.  Is unclear what is causing your abdominal discomfort as well as your hematuria.  You need to keep your follow-up with urology as scheduled.  I would suggest talking to your primary care doctor about obtaining a CT scan of your abdomen and pelvis in the interim to look for other pathology within your kidneys or your bladder which may be causing some of your symptoms.  Use the tramadol as needed for severe pain.  You can take this every 6 hours.  Do not drink alcohol or drive if you take this as it will make you drowsy.  We are going to send her urine for culture and if it grows out bacteria it may be worthwhile discussing stopping the Jardiance with your primary care provider.  The Jardiance causes you to excrete sugar through your urine and is a ready source of food for bacteria which could lead to infection or continuing infection.  If you develop any fever, sharp increase in abdominal pain, or an increase in your hematuria please return for reevaluation or go to the emergency department.

## 2020-12-09 LAB — URINE CULTURE

## 2020-12-11 ENCOUNTER — Other Ambulatory Visit: Payer: Self-pay | Admitting: Internal Medicine

## 2020-12-11 DIAGNOSIS — N390 Urinary tract infection, site not specified: Secondary | ICD-10-CM

## 2020-12-11 DIAGNOSIS — R1084 Generalized abdominal pain: Secondary | ICD-10-CM

## 2020-12-11 DIAGNOSIS — R8290 Unspecified abnormal findings in urine: Secondary | ICD-10-CM

## 2020-12-12 ENCOUNTER — Ambulatory Visit
Admission: RE | Admit: 2020-12-12 | Discharge: 2020-12-12 | Disposition: A | Discharge status: Home / Self Care | Payer: Medicare PPO | Source: Ambulatory Visit | Attending: Internal Medicine | Admitting: Internal Medicine

## 2020-12-12 DIAGNOSIS — R1084 Generalized abdominal pain: Secondary | ICD-10-CM | POA: Diagnosis present

## 2020-12-12 DIAGNOSIS — R8290 Unspecified abnormal findings in urine: Secondary | ICD-10-CM

## 2020-12-12 DIAGNOSIS — N390 Urinary tract infection, site not specified: Secondary | ICD-10-CM | POA: Diagnosis present

## 2020-12-12 MED ORDER — IOHEXOL 350 MG/ML SOLN
100.0000 mL | Freq: Once | INTRAVENOUS | Status: AC | PRN
  Administered 2020-12-12: 100 mL via INTRAVENOUS

## 2020-12-13 ENCOUNTER — Encounter: Payer: Self-pay | Admitting: Urology

## 2020-12-13 ENCOUNTER — Ambulatory Visit: Payer: Medicare PPO | Admitting: Urology

## 2020-12-13 ENCOUNTER — Other Ambulatory Visit: Payer: Self-pay

## 2020-12-13 VITALS — BP 160/83 | HR 83 | Ht 67.0 in | Wt 297.0 lb

## 2020-12-13 DIAGNOSIS — R310 Gross hematuria: Secondary | ICD-10-CM

## 2020-12-13 DIAGNOSIS — E278 Other specified disorders of adrenal gland: Secondary | ICD-10-CM | POA: Diagnosis not present

## 2020-12-13 DIAGNOSIS — N390 Urinary tract infection, site not specified: Secondary | ICD-10-CM | POA: Diagnosis not present

## 2020-12-13 NOTE — Progress Notes (Signed)
12/13/2020 11:14 AM   Valerie Case 01-12-1951 [id number]  Referring provider: Tracie Harrier, MD [address] [facility] [name],  [place] [address]  Chief Complaint  Patient presents with   Recurrent UTI    HPI: Valerie Case is a 70 y.o. female referred for recurrent UTI.  Seen [facility] 11/2021 complaining of gross hematuria and lower abdominal pain.  UA with >50 RBC, 6-10 WBC and 6-10 squamous epithelials.  Urine culture grew multiple species Record review last 12 months remarkable for positive urine cultures 04/2020 Enterobacter, 08/2020 E. coli and 10/2020 E. Coli Treated with cefuroxime 8/22 and Cipro 11/2020 CT abdomen/pelvis with contrast was ordered and performed yesterday which showed no hydronephrosis, renal mass or calculi.  Small left renal cyst was noted.  A 2.6 cm left adrenal mass was incidentally identified States saw Dr. Jacqlyn Larsen several years ago for recurrent UTI and had negative cystoscopy   PMH: Past Medical History:  Diagnosis Date   Asthma    Diabetes mellitus without complication ([place])    Hx of dysplastic nevus 07/16/2010   RLQA   Hypertension     Surgical History: History reviewed. No pertinent surgical history.  Home Medications:  Allergies as of 12/13/2020       Reactions   Aspirin         Medication List        Accurate as of December 13, 2020 11:14 AM. If you have any questions, ask your nurse or doctor.          acetaminophen 650 MG CR tablet Commonly known as: TYLENOL Take by mouth.   empagliflozin 25 MG Tabs tablet Commonly known as: JARDIANCE Take 1 tablet by mouth daily.   ferrous sulfate 325 (65 FE) MG EC tablet TAKE 1 TABLET BY MOUTH EVERY DAY WITH BREAKFAST   FLUoxetine 10 MG capsule Commonly known as: PROZAC   hydrochlorothiazide 25 MG tablet Commonly known as: HYDRODIURIL   lisinopril 40 MG tablet Commonly known as: ZESTRIL   metFORMIN 500 MG 24 hr tablet Commonly known  as: GLUCOPHAGE-XR   metoprolol succinate 25 MG 24 hr tablet Commonly known as: TOPROL-XL   NovoLIN 70/30 Kwikpen (70-30) 100 UNIT/ML KwikPen Generic drug: insulin isophane & regular human KwikPen   Ozempic (1 MG/DOSE) 4 MG/3ML Sopn Generic drug: Semaglutide (1 MG/DOSE)   rosuvastatin 10 MG tablet Commonly known as: CRESTOR   traMADol 50 MG tablet Commonly known as: ULTRAM Take 1 tablet (50 mg total) by mouth every 6 (six) hours as needed.        Allergies:  Allergies  Allergen Reactions   Aspirin     Family History: Family History  Problem Relation Age of Onset   Breast cancer Mother 24   Cancer Mother    Cancer Father     Social History:  reports that she has never smoked. She has never used smokeless tobacco. She reports that she does not drink alcohol and does not use drugs.   Physical Exam: BP (!) 160/83   Pulse 83   Ht 5\' 7"  (1.702 m)   Wt 297 lb (134.7 kg)   BMI 46.52 kg/m   Constitutional:  Alert and oriented, No acute distress. HEENT: [place] AT, moist mucus membranes.  Trachea midline, no masses. Cardiovascular: No clubbing, cyanosis, or edema. Respiratory: Normal respiratory effort, no increased work of breathing. Psychiatric: Normal mood and affect.  Laboratory Data:  Urinalysis 12/13/2020: Dipstick 2+ blood/2+ glucose; microscopy 6-10 WBC/11-30 RBC   Pertinent  Imaging: CT images personally reviewed and interpreted   Assessment & Plan:    1.  Gross hematuria We discussed the standard recommended evaluation for high risk hematuria to include CT urogram and cystoscopy.  A CT abdomen/pelvis with contrast was ordered and not CT urogram Recommend scheduling cystoscopy and if no significant abnormalities identified and she has persistent hematuria will need to order a CT urogram  2.  Recurrent UTI Urine culture last week was negative UA today with 11-30 RBC/6-10 WBC.  Repeat urine culture ordered  3.  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 Gabriel Carina in November and will most likely need a functional evaluation If she does require CT urogram the adrenal mass can be further evaluated at that time   Abbie Sons, MD  [place] [address] [place], [place] [address] [phone]

## 2020-12-13 NOTE — Patient Instructions (Signed)
Get over the counter Cranberry and D-Mannose tablets and take them daily.

## 2020-12-14 LAB — URINALYSIS, COMPLETE
Bilirubin, UA: NEGATIVE
Ketones, UA: NEGATIVE
Leukocytes,UA: NEGATIVE
Nitrite, UA: NEGATIVE
Protein,UA: NEGATIVE
Specific Gravity, UA: 1.01 (ref 1.005–1.030)
Urobilinogen, Ur: 0.2 mg/dL (ref 0.2–1.0)
pH, UA: 5.5 (ref 5.0–7.5)

## 2020-12-14 LAB — MICROSCOPIC EXAMINATION

## 2020-12-16 ENCOUNTER — Encounter: Payer: Self-pay | Admitting: Urology

## 2020-12-17 ENCOUNTER — Ambulatory Visit: Payer: Medicare PPO | Admitting: Urology

## 2021-01-11 ENCOUNTER — Other Ambulatory Visit: Payer: Self-pay | Admitting: Internal Medicine

## 2021-01-11 DIAGNOSIS — E278 Other specified disorders of adrenal gland: Secondary | ICD-10-CM

## 2021-01-14 ENCOUNTER — Ambulatory Visit: Payer: Medicare PPO | Admitting: Urology

## 2021-01-14 ENCOUNTER — Encounter: Payer: Self-pay | Admitting: Urology

## 2021-01-14 ENCOUNTER — Other Ambulatory Visit: Payer: Self-pay

## 2021-01-14 VITALS — BP 119/67 | HR 88 | Ht 67.0 in | Wt 292.0 lb

## 2021-01-14 DIAGNOSIS — R310 Gross hematuria: Secondary | ICD-10-CM | POA: Diagnosis not present

## 2021-01-14 DIAGNOSIS — N390 Urinary tract infection, site not specified: Secondary | ICD-10-CM

## 2021-01-14 NOTE — Progress Notes (Signed)
   01/14/21  CC:  Chief Complaint  Patient presents with   Cysto    HPI: History recurrent UTI with gross hematuria.  Since her last visit has had intermittent gross hematuria with clots without UTI symptoms  See rooming tab for vitals NED. A&Ox3.   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  Lidocaine jelly was administered per urethral meatus.    Procedure: - Flexible cystoscope introduced, without any difficulty.   - Thorough search of the bladder revealed:    normal urethral meatus    normal urothelium    no stones    no ulcers     no tumors    no urethral polyps    no trabeculation  - Ureteral orifices were normal in position and appearance.  Post-Procedure: - Patient tolerated the procedure well  Assessment/ Plan: No abnormalities identified on cystoscopy Since she has continued gross hematuria will schedule CT urogram    Abbie Sons, MD

## 2021-01-16 LAB — URINALYSIS, COMPLETE
Bilirubin, UA: NEGATIVE
Ketones, UA: NEGATIVE
Leukocytes,UA: NEGATIVE
Nitrite, UA: NEGATIVE
Protein,UA: NEGATIVE
Specific Gravity, UA: 1.015 (ref 1.005–1.030)
Urobilinogen, Ur: 0.2 mg/dL (ref 0.2–1.0)
pH, UA: 5.5 (ref 5.0–7.5)

## 2021-01-16 LAB — MICROSCOPIC EXAMINATION: Bacteria, UA: NONE SEEN

## 2021-02-12 ENCOUNTER — Ambulatory Visit
Admission: RE | Admit: 2021-02-12 | Discharge: 2021-02-12 | Disposition: A | Discharge status: Home / Self Care | Payer: Medicare PPO | Source: Ambulatory Visit | Attending: Urology | Admitting: Urology

## 2021-02-12 ENCOUNTER — Other Ambulatory Visit: Payer: Self-pay

## 2021-02-12 DIAGNOSIS — R310 Gross hematuria: Secondary | ICD-10-CM | POA: Diagnosis not present

## 2021-02-12 LAB — POCT I-STAT CREATININE: Creatinine, Ser: 0.9 mg/dL (ref 0.44–1.00)

## 2021-02-12 MED ORDER — IOHEXOL 350 MG/ML SOLN
100.0000 mL | Freq: Once | INTRAVENOUS | Status: AC | PRN
  Administered 2021-02-12: 100 mL via INTRAVENOUS

## 2021-02-14 ENCOUNTER — Telehealth: Payer: Self-pay | Admitting: *Deleted

## 2021-02-14 NOTE — Telephone Encounter (Signed)
-----   Message from Abbie Sons, MD sent at 02/14/2021  7:19 AM EST ----- CT scan showed no genitourinary abnormalities.  She does have a small gallstone.  Recommend follow-up visit 1 month with repeat UA

## 2021-02-14 NOTE — Telephone Encounter (Signed)
Notified patient as instructed, patient pleased. Discussed follow-up appointments, patient agrees  

## 2021-03-21 ENCOUNTER — Ambulatory Visit: Payer: Medicare PPO | Admitting: Urology

## 2021-04-01 ENCOUNTER — Other Ambulatory Visit: Payer: Self-pay | Admitting: Obstetrics and Gynecology

## 2021-04-01 ENCOUNTER — Other Ambulatory Visit: Payer: Self-pay

## 2021-04-01 ENCOUNTER — Encounter: Payer: Self-pay | Admitting: Obstetrics and Gynecology

## 2021-04-01 ENCOUNTER — Inpatient Hospital Stay
Admission: AD | Admit: 2021-04-01 | Discharge: 2021-04-03 | DRG: 812 | Disposition: A | Discharge status: Home / Self Care | Payer: Medicare Other | Source: Ambulatory Visit | Attending: Obstetrics | Admitting: Obstetrics

## 2021-04-01 ENCOUNTER — Inpatient Hospital Stay: Payer: Medicare Other

## 2021-04-01 DIAGNOSIS — Z20822 Contact with and (suspected) exposure to covid-19: Secondary | ICD-10-CM | POA: Diagnosis present

## 2021-04-01 DIAGNOSIS — I10 Essential (primary) hypertension: Secondary | ICD-10-CM

## 2021-04-01 DIAGNOSIS — Z6841 Body Mass Index (BMI) 40.0 and over, adult: Secondary | ICD-10-CM

## 2021-04-01 DIAGNOSIS — R634 Abnormal weight loss: Secondary | ICD-10-CM | POA: Diagnosis present

## 2021-04-01 DIAGNOSIS — D62 Acute posthemorrhagic anemia: Secondary | ICD-10-CM | POA: Diagnosis present

## 2021-04-01 DIAGNOSIS — B370 Candidal stomatitis: Secondary | ICD-10-CM | POA: Diagnosis not present

## 2021-04-01 DIAGNOSIS — E1165 Type 2 diabetes mellitus with hyperglycemia: Secondary | ICD-10-CM | POA: Diagnosis not present

## 2021-04-01 DIAGNOSIS — F32A Depression, unspecified: Secondary | ICD-10-CM | POA: Diagnosis not present

## 2021-04-01 DIAGNOSIS — D649 Anemia, unspecified: Secondary | ICD-10-CM

## 2021-04-01 DIAGNOSIS — R600 Localized edema: Secondary | ICD-10-CM | POA: Diagnosis present

## 2021-04-01 DIAGNOSIS — Z8709 Personal history of other diseases of the respiratory system: Secondary | ICD-10-CM

## 2021-04-01 DIAGNOSIS — E119 Type 2 diabetes mellitus without complications: Secondary | ICD-10-CM

## 2021-04-01 DIAGNOSIS — Z794 Long term (current) use of insulin: Secondary | ICD-10-CM | POA: Diagnosis not present

## 2021-04-01 DIAGNOSIS — D72829 Elevated white blood cell count, unspecified: Secondary | ICD-10-CM | POA: Diagnosis present

## 2021-04-01 DIAGNOSIS — G4730 Sleep apnea, unspecified: Secondary | ICD-10-CM | POA: Diagnosis not present

## 2021-04-01 DIAGNOSIS — M7989 Other specified soft tissue disorders: Secondary | ICD-10-CM

## 2021-04-01 DIAGNOSIS — N179 Acute kidney failure, unspecified: Secondary | ICD-10-CM | POA: Diagnosis not present

## 2021-04-01 DIAGNOSIS — N950 Postmenopausal bleeding: Secondary | ICD-10-CM | POA: Diagnosis not present

## 2021-04-01 DIAGNOSIS — Z5189 Encounter for other specified aftercare: Secondary | ICD-10-CM

## 2021-04-01 HISTORY — DX: Type 2 diabetes mellitus without complications: Z79.4

## 2021-04-01 HISTORY — DX: Type 2 diabetes mellitus without complications: E11.9

## 2021-04-01 HISTORY — DX: Essential (primary) hypertension: I10

## 2021-04-01 HISTORY — DX: Depression, unspecified: F32.A

## 2021-04-01 LAB — COMPREHENSIVE METABOLIC PANEL
ALT: 11 U/L (ref 0–44)
AST: 15 U/L (ref 15–41)
Albumin: 3.7 g/dL (ref 3.5–5.0)
Alkaline Phosphatase: 59 U/L (ref 38–126)
Anion gap: 10 (ref 5–15)
BUN: 58 mg/dL — ABNORMAL HIGH (ref 8–23)
CO2: 21 mmol/L — ABNORMAL LOW (ref 22–32)
Calcium: 8.8 mg/dL — ABNORMAL LOW (ref 8.9–10.3)
Chloride: 103 mmol/L (ref 98–111)
Creatinine, Ser: 2.58 mg/dL — ABNORMAL HIGH (ref 0.44–1.00)
GFR, Estimated: 20 mL/min — ABNORMAL LOW (ref >60)
Glucose, Bld: 244 mg/dL — ABNORMAL HIGH (ref 70–99)
Potassium: 5.1 mmol/L (ref 3.5–5.1)
Sodium: 134 mmol/L — ABNORMAL LOW (ref 135–145)
Total Bilirubin: 0.3 mg/dL (ref 0.3–1.2)
Total Protein: 6.8 g/dL (ref 6.5–8.1)

## 2021-04-01 LAB — HEMOGLOBIN AND HEMATOCRIT, BLOOD
HCT: 22.8 % — ABNORMAL LOW (ref 36.0–46.0)
Hemoglobin: 7.4 g/dL — ABNORMAL LOW (ref 12.0–15.0)

## 2021-04-01 LAB — CBC
HCT: 21.7 % — ABNORMAL LOW (ref 36.0–46.0)
Hemoglobin: 7 g/dL — ABNORMAL LOW (ref 12.0–15.0)
MCH: 29 pg (ref 26.0–34.0)
MCHC: 32.3 g/dL (ref 30.0–36.0)
MCV: 90 fL (ref 80.0–100.0)
Platelets: 301 10*3/uL (ref 150–400)
RBC: 2.41 MIL/uL — ABNORMAL LOW (ref 3.87–5.11)
RDW: 13.9 % (ref 11.5–15.5)
WBC: 11.5 10*3/uL — ABNORMAL HIGH (ref 4.0–10.5)
nRBC: 0 % (ref 0.0–0.2)

## 2021-04-01 LAB — PREPARE RBC (CROSSMATCH)

## 2021-04-01 LAB — RESP PANEL BY RT-PCR (FLU A&B, COVID) ARPGX2
Influenza A by PCR: NEGATIVE
Influenza B by PCR: NEGATIVE
SARS Coronavirus 2 by RT PCR: NEGATIVE

## 2021-04-01 LAB — ABO/RH: ABO/RH(D): A POS

## 2021-04-01 LAB — GLUCOSE, CAPILLARY
Glucose-Capillary: 238 mg/dL — ABNORMAL HIGH (ref 70–99)
Glucose-Capillary: 176 mg/dL — ABNORMAL HIGH (ref 70–99)

## 2021-04-01 MED ORDER — INSULIN ASPART PROT & ASPART (70-30 MIX) 100 UNIT/ML ~~LOC~~ SUSP
110.0000 [IU] | Freq: Every day | SUBCUTANEOUS | Status: DC
  Filled 2021-04-01: qty 10

## 2021-04-01 MED ORDER — INSULIN ASPART PROT & ASPART (70-30 MIX) 100 UNIT/ML ~~LOC~~ SUSP
50.0000 [IU] | Freq: Every day | SUBCUTANEOUS | Status: DC
  Filled 2021-04-01: qty 10

## 2021-04-01 MED ORDER — MEDROXYPROGESTERONE ACETATE 10 MG PO TABS
20.0000 mg | ORAL_TABLET | Freq: Three times a day (TID) | ORAL | Status: DC
  Administered 2021-04-01 – 2021-04-03 (×7): 20 mg via ORAL
  Filled 2021-04-01 (×7): qty 2

## 2021-04-01 MED ORDER — INSULIN ISOPHANE & REGULAR (HUMAN 70-30)100 UNIT/ML KWIKPEN
110.0000 [IU] | PEN_INJECTOR | Freq: Every day | SUBCUTANEOUS | Status: DC
Start: 2021-04-01 — End: 2021-04-01

## 2021-04-01 MED ORDER — TRAMADOL HCL 50 MG PO TABS
50.0000 mg | ORAL_TABLET | Freq: Four times a day (QID) | ORAL | Status: DC | PRN
  Administered 2021-04-01 – 2021-04-03 (×4): 50 mg via ORAL
  Filled 2021-04-01 (×4): qty 1

## 2021-04-01 MED ORDER — METOPROLOL SUCCINATE ER 25 MG PO TB24
25.0000 mg | ORAL_TABLET | Freq: Every day | ORAL | Status: DC
Start: 2021-04-02 — End: 2021-04-03
  Administered 2021-04-02 – 2021-04-03 (×2): 25 mg via ORAL
  Filled 2021-04-01 (×2): qty 1

## 2021-04-01 MED ORDER — INSULIN ASPART 100 UNIT/ML IJ SOLN: 0.0000 [IU] | INTRAMUSCULAR | Status: DC

## 2021-04-01 MED ORDER — FLUTICASONE PROPIONATE 50 MCG/ACT NA SUSP
2.0000 | Freq: Every day | NASAL | Status: DC | PRN
  Filled 2021-04-01: qty 16

## 2021-04-01 MED ORDER — ROSUVASTATIN CALCIUM 10 MG PO TABS
10.0000 mg | ORAL_TABLET | Freq: Every day | ORAL | Status: DC
  Administered 2021-04-02 – 2021-04-03 (×2): 10 mg via ORAL
  Filled 2021-04-01 (×2): qty 1

## 2021-04-01 MED ORDER — INSULIN ASPART 100 UNIT/ML IJ SOLN
0.0000 [IU] | Freq: Every day | INTRAMUSCULAR | Status: DC
  Administered 2021-04-02: 2 [IU] via SUBCUTANEOUS
  Filled 2021-04-01: qty 1

## 2021-04-01 MED ORDER — SODIUM CHLORIDE 0.9 % IV SOLN: INTRAVENOUS | Status: DC

## 2021-04-01 MED ORDER — FLUOXETINE HCL 10 MG PO CAPS
10.0000 mg | ORAL_CAPSULE | Freq: Every day | ORAL | Status: DC
Start: 2021-04-02 — End: 2021-04-03
  Administered 2021-04-02 – 2021-04-03 (×2): 10 mg via ORAL
  Filled 2021-04-01 (×2): qty 1

## 2021-04-01 MED ORDER — SEMAGLUTIDE (1 MG/DOSE) 4 MG/3ML ~~LOC~~ SOPN: 1.0000 mg | PEN_INJECTOR | SUBCUTANEOUS | Status: DC

## 2021-04-01 MED ORDER — NYSTATIN 100000 UNIT/ML MT SUSP
5.0000 mL | Freq: Four times a day (QID) | OROMUCOSAL | Status: DC
  Administered 2021-04-01 – 2021-04-03 (×6): 500000 [IU] via ORAL
  Filled 2021-04-01 (×8): qty 5

## 2021-04-01 MED ORDER — HYDROCHLOROTHIAZIDE 25 MG PO TABS: 25.0000 mg | ORAL_TABLET | Freq: Every day | ORAL | Status: DC

## 2021-04-01 MED ORDER — HYDRALAZINE HCL 20 MG/ML IJ SOLN: 5.0000 mg | Freq: Four times a day (QID) | INTRAMUSCULAR | Status: DC | PRN

## 2021-04-01 MED ORDER — DIPHENHYDRAMINE HCL 25 MG PO CAPS
25.0000 mg | ORAL_CAPSULE | Freq: Once | ORAL | Status: AC
  Administered 2021-04-01: 25 mg via ORAL
  Filled 2021-04-01: qty 1

## 2021-04-01 MED ORDER — METFORMIN HCL ER 500 MG PO TB24
500.0000 mg | ORAL_TABLET | Freq: Every day | ORAL | Status: DC
Start: 2021-04-02 — End: 2021-04-01
  Filled 2021-04-01: qty 1

## 2021-04-01 MED ORDER — LISINOPRIL 20 MG PO TABS
40.0000 mg | ORAL_TABLET | Freq: Every day | ORAL | Status: DC
Start: 2021-04-01 — End: 2021-04-01
  Filled 2021-04-01: qty 2

## 2021-04-01 MED ORDER — SODIUM CHLORIDE 0.9% IV SOLUTION: Freq: Once | INTRAVENOUS | Status: AC

## 2021-04-01 MED ORDER — INSULIN ASPART PROT & ASPART (70-30 MIX) 100 UNIT/ML ~~LOC~~ SUSP: 110.0000 [IU] | Freq: Every day | SUBCUTANEOUS | Status: DC

## 2021-04-01 MED ORDER — ACETAMINOPHEN 325 MG PO TABS
650.0000 mg | ORAL_TABLET | Freq: Once | ORAL | Status: DC
  Administered 2021-04-01: 650 mg via ORAL

## 2021-04-01 MED ORDER — INSULIN ASPART 100 UNIT/ML IJ SOLN: 0.0000 [IU] | Freq: Three times a day (TID) | INTRAMUSCULAR | Status: DC

## 2021-04-01 MED ORDER — ALBUTEROL SULFATE (2.5 MG/3ML) 0.083% IN NEBU: 3.0000 mL | INHALATION_SOLUTION | RESPIRATORY_TRACT | Status: DC | PRN

## 2021-04-01 NOTE — Hospital Course (Signed)
[name] is a 71 year old female with history of hypertension, insulin-dependent diabetes mellitus on 70/30, depression, anxiety, obesity, hyperlipidemia, who presents to the hospital as a direct admission under OB/GYN service for vaginal bleeding in a postmenopausal female.  Hospitalist service has been consulted for management of chronic medical diagnosis, specifically insulin-dependent diabetes melitis and acute kidney injury.  Initial vitals in the hospital showed temperature of 99.4, respiration rate of 18, heart rate of 98, blood pressure 124/69, SPO2 of 99% on room air.  Per med reconciliation by primary team, patient has taken her home antihypertensive morning medications.

## 2021-04-01 NOTE — Assessment & Plan Note (Signed)
-   Controlled - Holding home hydrochlorothiazide 25 mg daily and lisinopril 40 mg daily due to acute kidney injury - Continue metoprolol succinate 25 mg daily - Hydralazine injection 5 mg IV every 6 hours as needed for SBP greater than 165, 2 days ordered - CMP in the a.m.

## 2021-04-01 NOTE — Progress Notes (Signed)
Portable xray on unit for chest xray

## 2021-04-01 NOTE — Assessment & Plan Note (Signed)
She has lost approximately 25 pounds since August 2022 Recommend discussion with pcp for evaluation of weight loss includign work up for carcinoma

## 2021-04-01 NOTE — Assessment & Plan Note (Signed)
-   Last used her inhaler two weeks ago - Albuterol inhaler, 2 puffs q4h prn for shortness of breath and wheezing

## 2021-04-01 NOTE — Assessment & Plan Note (Signed)
-   Bilateral lower extremity ultrasound ordered to assess for DVT

## 2021-04-01 NOTE — Assessment & Plan Note (Signed)
-   Present on admission - Nystatin oral suspension 4 times daily ordered

## 2021-04-01 NOTE — Assessment & Plan Note (Addendum)
-   Holding home metformin at this time due to acute kidney injury - Hold home outpatient insulin regimen of NovoLog Mix 70/30 110 units daily with supper and 50 units subcutaneous daily with breakfast as patient is NPO at this time - Change semaglutide 1 mg inhalation to injection weekly  - Insulin SSI with at bedtime coverage - Glucose check 3 times daily AC - Goal inpatient blood glucose level is 140-180

## 2021-04-01 NOTE — Assessment & Plan Note (Addendum)
-   Treatment per primary team - Agree with continuing n.p.o. except for sips with meds - Agree with 1 unit of packed red blood cells per primary team - I recommend repeat hemoglobin and hematocrit after completion of 1 unit prbc - I also recommend portable chest x-ray after completion of 1 unit of packed red blood cells

## 2021-04-01 NOTE — Assessment & Plan Note (Addendum)
-   Present admission, presumed secondary to reactive in setting of acute blood loss anemia - CBC in the a.m.

## 2021-04-01 NOTE — Assessment & Plan Note (Addendum)
-   home fluoxetine 10 mg daily has been resumed

## 2021-04-01 NOTE — Assessment & Plan Note (Addendum)
-   Serum creatinine per primary team order was 2.58/GFR of 20 - Baseline serum creatinine is 0.9, GFR greater than 60; no CKD prior - Presumed this is prerenal in setting of blood loss anemia - Agree with 1 unit of packed red blood cells per primary team - Sodium chloride IVF at 50 mL/h starting at 2200, for 10 hours ordered to complete 500 mL bag - CMP in the a.m.

## 2021-04-01 NOTE — H&P (Signed)
Gynecologic History and Physical   SERVICE: Gynecology   Patient Name: Valerie Case Patient MRN:   [id number]  CC: postmenopausal vaginal bleeding   HPI: Valerie Case is a 71 y.o. G1P1001 with postmenopausal vaginal bleeding and symptomatic anemia.  She presented for an [place] in clinic today to assess postmenopausal vaginal bleeding and had an endometrial biopsy performed also. Valerie Case reports soaking through 1 pad per hour for almost a week along with passing large clots. She additionally feels significant mood changes and anxiety about her health.  H&H done in clinic today and was 6.9 and 21.0.  Valerie Case was direct admitted for a blood transfusion for symptomatic anemia and inpatient management of vaginal bleeding.     Review of Systems: positives in bold Review of Systems  Constitutional:  Positive for malaise/fatigue. Negative for chills and fever.  Eyes:  Negative for blurred vision.  Respiratory:  Positive for shortness of breath.   Cardiovascular:  Negative for chest pain and palpitations.  Gastrointestinal:  Positive for abdominal pain and nausea. Negative for heartburn and vomiting.  Genitourinary:  Negative for dysuria, hematuria and urgency.  Musculoskeletal:  Positive for back pain and myalgias.  Skin:  Negative for itching and rash.  Neurological:  Positive for dizziness.  Psychiatric/Behavioral:  The patient is nervous/anxious.     Past Obstetrical History: OB History     Gravida  1   Para  1   Term  1   Preterm      AB      Living  1      SAB      IAB      Ectopic      Multiple      Live Births  1           Past Gynecologic History: No LMP recorded. Patient is postmenopausal.  Menopause age: Early 56's PMB that started several months ago but became significantly worse over the past week  Sexually active: not currently History of sexually transmitted infections: denies   Past Medical History: Past Medical History:  Diagnosis  Date   Asthma    Diabetes mellitus without complication ([place])    Hx of dysplastic nevus 07/16/2010   RLQA   Hypertension     Past Surgical History:  No past surgical history on file.  Family History:  family history includes Breast cancer (age of onset: 52) in her mother; Cancer in her father and mother.  Social History:   reports that she has never smoked. She has never used smokeless tobacco. She reports that she does not drink alcohol and does not use drugs.  Home Medications:  Medications reconciled in EPIC  No current facility-administered medications on file prior to encounter.   Current Outpatient Medications on File Prior to Encounter  Medication Sig Dispense Refill   empagliflozin (JARDIANCE) 25 MG TABS tablet Take 1 tablet by mouth daily.     ferrous sulfate 325 (65 FE) MG EC tablet TAKE 1 TABLET BY MOUTH EVERY DAY WITH BREAKFAST     FLUoxetine (PROZAC) 10 MG capsule      hydrochlorothiazide (HYDRODIURIL) 25 MG tablet      lisinopril (ZESTRIL) 40 MG tablet      metFORMIN (GLUCOPHAGE-XR) 500 MG 24 hr tablet      metoprolol succinate (TOPROL-XL) 25 MG 24 hr tablet      NOVOLIN 70/30 FLEXPEN (70-30) 100 UNIT/ML KwikPen      OZEMPIC, 1 MG/DOSE, 4 MG/3ML SOPN  rosuvastatin (CRESTOR) 10 MG tablet      traMADol (ULTRAM) 50 MG tablet Take 1 tablet (50 mg total) by mouth every 6 (six) hours as needed. 15 tablet 0    Allergies:  Allergies  Allergen Reactions   Aspirin     Physical Exam:  Temp:  [99.4 F (37.4 C)] 99.4 F (37.4 C) (01/16 1708) Pulse Rate:  [98] 98 (01/16 1708) Resp:  [18] 18 (01/16 1708) BP: (124)/(69) 124/69 (01/16 1708) SpO2:  [99 %] 99 % (01/16 1708)  Physical Exam Chaperone present: normal for age, atrhophy present.  Constitutional:      Appearance: She is obese.  Cardiovascular:     Rate and Rhythm: Normal rate.  Pulmonary:     Effort: Pulmonary effort is normal.  Abdominal:     General: Bowel sounds are normal.     Palpations:  Abdomen is soft.     Tenderness: There is abdominal tenderness (lower abdomen).  Genitourinary:    Vagina: Bleeding present.     Cervix: No discharge.     Rectum: Normal.     Comments: Patient unable to tolerate bimanual exam due to significant pain. Exam limited by body habitus. Musculoskeletal:        General: Normal range of motion.     Cervical back: Normal range of motion.  Skin:    General: Skin is warm and dry.     Capillary Refill: Capillary refill takes less than 2 seconds.     Coloration: Skin is pale.  Neurological:     Mental Status: She is alert and oriented to person, place, and time.  Psychiatric:        Mood and Affect: Mood is anxious.        Judgment: Judgment normal.     Comments: Appropriate affect, appropriately concerned     Labs/Studies:   CBC and Coags:  Lab Results  Component Value Date   WBC 11.5 (H) 04/01/2021   NEUTOPHILPCT 54 12/07/2020   EOSPCT 4 12/07/2020   BASOPCT 1 12/07/2020   LYMPHOPCT 32 12/07/2020   HGB 7.0 (L) 04/01/2021   HCT 21.7 (L) 04/01/2021   MCV 90.0 04/01/2021   PLT 301 04/01/2021   CMP:  Lab Results  Component Value Date   NA 135 12/07/2020   K 4.2 12/07/2020   CL 104 12/07/2020   CO2 21 (L) 12/07/2020   BUN 25 (H) 12/07/2020   CREATININE 0.90 02/12/2021   CREATININE 0.90 12/07/2020   PROT 7.8 12/07/2020   BILITOT 0.5 12/07/2020   ALT 18 12/07/2020   AST 22 12/07/2020   ALKPHOS 54 12/07/2020    TVUS:  done at [facility] today Uterus retroverted Endometrium = 6.96 mm Right ovary appears normal  Left simple ovarian cyst = 1.2cm No free fluid seen Fibroids seen, possible adenomyosis 1. Right lateral = 3.1cm 2. Left lateral = 2.9cm 3. Left posterior = 1.8cm     Assessment / Plan:   Valerie Case is a 71 y.o. G1P1001 who presents with postmenopausal vaginal bleeding and symptomatic anemia   1. Admit for inpatient management of vaginal bleeding and symptomatic anemia  -Discussed plan with Dr. Glennon Mac -Will  type and screen for 2 units of pRBC's -pad count - weigh pads for ongoing QBL -NPO -Provera 20mg  q8 hours PO  -Endometrial biopsy completed today in office  -Risks and benefits of possible D&C discussed with patient and her daughter.  Reviewed that her bleeding continued or became worse despite starting Provera that a  D&C may be recommended to control bleeding.  This would be performed by the MD on call for [facility] OB[facility].   -Will consult with hospital team for assistance in managing diabetes and hypertension while inpatient.    ----- Drinda Butts, CNM Midwife [facility], Department of [facility]

## 2021-04-01 NOTE — Progress Notes (Signed)
Lab on unit to draw H&H

## 2021-04-01 NOTE — Consult Note (Addendum)
[facility] Consult Note  Valerie Case TIW:[id number] DOB: 1950/07/01 DOA: 04/01/2021  PCP: Valerie Harrier, MD  Outpatient Specialists: Dr. Gabriel Case, endocrinology Patient coming from: OB/GYN clinic  I have personally briefly reviewed patient's old medical records in [place].  Chief Concern: Symptomatic anemia Consult reason: Medical management of chronic medical diagnosis Consulting provider: Drinda Case, CNM and Dr. Glennon Case  HPI and Hospital course: [name] is a 71 year old female with history of hypertension, insulin-dependent diabetes mellitus on 70/30, depression, anxiety, obesity, hyperlipidemia, who presents to the hospital as a direct admission under OB/GYN service for vaginal bleeding in a postmenopausal female.  Hospitalist service has been consulted for management of chronic medical diagnosis, specifically insulin-dependent diabetes melitis and acute kidney injury.  Initial vitals in the hospital showed temperature of 99.4, respiration rate of 18, heart rate of 98, blood pressure 124/69, SPO2 of 99% on room air.  Per med reconciliation by primary team, patient has taken her home antihypertensive morning medications.  At bedside, she is able to tell me her name, age, and talk to me about the birth story about her daughter (difficulty conceiving and c-section delivery), the current calendar.   Repeat manual blood pressure showed SBP 122-125/ DBP 60.  She presented to obgyn for chief concern of biopsy and [place] outpatient.   She reports she started bleeding in [facility] August of 2022. She developed menopause in 2006/2007. She endorsess unintentional weight loss, since August 2022 was 25 pounds.   She endorses hot flashes.  She endorses shortness of breath with exertion.  She denies chest pain, abdominal pain, dysuria, hematuria, syncopal event.  She endorses bilateral lower extremity swelling that has been ongoing for the last 2 weeks.  She further  endorses that her left lower extremity has increased tenderness compared to the right.  She states this is new for her.  She is scheduled for colonoscopy for December 2022 but she could not go because she was ill. She is at least 10 years since her last coloscopy.  Her last mammogram was summer to 2021.   Social history: She lives by herself at home. She denies ever using tobacco use. She drinks etoh about twice per year and last drink was about 1 year ago. She denies recreational drug use. She is retired and formerly worked as a [employer] fast [employer]er.  Vaccination history: She is vaccianted for covid and influenza. She has had a total of three covid 19 vaccine shots.   ROS: Constitutional: + weight change, no fever ENT/Mouth: no sore throat, no rhinorrhea Eyes: no eye pain, no vision changes Cardiovascular: no chest pain, + dyspnea,  no edema, no palpitations Respiratory: no cough, no sputum, no wheezing Gastrointestinal: no nausea, no vomiting, no diarrhea, no constipation Genitourinary: no urinary incontinence, no dysuria, no hematuria Musculoskeletal: no arthralgias, no myalgias Skin: no skin lesions, no pruritus, Neuro: + weakness, no loss of consciousness, no syncope Psych: no anxiety, no depression, + decrease appetite Heme/Lymph: no bruising, no bleeding   Assessment/Plan  Principal Problem:   Symptomatic anemia Active Problems:   AKI (acute kidney injury) (HCC)   Essential hypertension   Insulin dependent type 2 diabetes mellitus (HCC)   Depression   Leukocytosis   Recent unintentional weight loss over several months   History of asthma   Thrush, oral   Bilateral lower extremity edema   * Symptomatic anemia Assessment & Plan - Treatment per primary team - Agree with continuing n.p.o. except for sips with meds - Agree with  initially transfusing 1 unit of PRBC - From medicine/cardiovascular standpoint, goal hemoglobin should be greater than 8.0 however I will defer to  primary team for final decision making regarding blood transfusion - I recommend repeat hemoglobin and hematocrit after completion of 1 unit prbc - I also recommend portable chest x-ray after completion of 1 unit of packed red blood cells  Cardiovascular and Mediastinum Essential hypertension Assessment & Plan - Controlled - Holding home hydrochlorothiazide 25 mg daily and lisinopril 40 mg daily due to acute kidney injury - Continue metoprolol succinate 25 mg daily - Hydralazine injection 5 mg IV every 6 hours as needed for SBP greater than 165, 2 days ordered - CMP in the a.m.  Digestive Thrush, oral Assessment & Plan - Present on admission - Nystatin oral suspension 4 times daily ordered  Endocrine Insulin dependent type 2 diabetes mellitus ([place]) Assessment & Plan - Holding home metformin at this time due to acute kidney injury - Hold home outpatient insulin regimen of NovoLog Mix 70/30 110 units daily with supper and 50 units subcutaneous daily with breakfast as patient is NPO at this time - Change semaglutide 1 mg inhalation to injection weekly  - Insulin SSI with at bedtime coverage - Glucose check 3 times daily AC - Goal inpatient blood glucose level is 140-180  Genitourinary AKI (acute kidney injury) ([place]) Assessment & Plan - Serum creatinine per primary team order was 2.58/GFR of 20 - Baseline serum creatinine is 0.9, GFR greater than 60; no CKD prior - Presumed this is prerenal in setting of blood loss anemia - Agree with 1 unit of packed red blood cells per primary team - Sodium chloride IVF at 50 mL/h starting at 2200, for 10 hours ordered to complete 500 mL bag - CMP in the a.m.  Other Bilateral lower extremity edema Assessment & Plan - Bilateral lower extremity ultrasound ordered to assess for DVT  History of asthma Assessment & Plan - Last used her inhaler two weeks ago - Albuterol inhaler, 2 puffs q4h prn for shortness of breath and wheezing  Recent  unintentional weight loss over several months Assessment & Plan She has lost approximately 25 pounds since August 2022 Recommend discussion with pcp for evaluation of weight loss includign work up for carcinoma  Leukocytosis Assessment & Plan - Present admission, presumed secondary to reactive in setting of acute blood loss anemia - CBC in the a.m.  Depression Assessment & Plan - home fluoxetine 10 mg daily has been resumed  # DVT prophylaxis-pharmacologic not ordered by myself due to acute and active bleeding - Primary team to initiate pharmacologic DVT prophylaxis when its benefits outweigh its risk  Chart reviewed.   DVT prophylaxis: Agree with TED hose Code Status: Full code Diet: N.p.o. per primary team Disposition Plan: Per primary team  Past Medical History:  Diagnosis Date   Asthma    Depression 04/01/2021   Diabetes mellitus without complication ([place])    Essential hypertension 04/01/2021   Hx of dysplastic nevus 07/16/2010   RLQA   Hypertension    Insulin dependent type 2 diabetes mellitus ([place]) 04/01/2021   History reviewed. No pertinent surgical history.  Social History:  reports that she has never smoked. She has never used smokeless tobacco. She reports that she does not drink alcohol and does not use drugs.  Allergies  Allergen Reactions   Aspirin    Family History  Problem Relation Age of Onset   Breast cancer Mother 8   Cancer Mother  Cancer Father    Family history: Family history reviewed and not pertinent.  Prior to Admission medications   Medication Sig Start Date End Date Taking? Authorizing Provider  empagliflozin (JARDIANCE) 25 MG TABS tablet Take 1 tablet by mouth daily. 09/06/19  Yes [provider]  ferrous sulfate 325 (65 FE) MG EC tablet TAKE 1 TABLET BY MOUTH EVERY DAY WITH BREAKFAST 08/29/19  Yes [provider]  FLUoxetine (PROZAC) 10 MG capsule  10/03/19  Yes [provider]  hydrochlorothiazide  (HYDRODIURIL) 25 MG tablet  07/27/19  Yes [provider]  lisinopril (ZESTRIL) 40 MG tablet  09/26/19  Yes [provider]  metFORMIN (GLUCOPHAGE-XR) 500 MG 24 hr tablet  11/21/19  Yes [provider]  metoprolol succinate (TOPROL-XL) 25 MG 24 hr tablet  09/22/19  Yes [provider]  NOVOLIN 70/30 FLEXPEN (70-30) 100 UNIT/ML KwikPen  11/11/19  Yes [provider]  G[place], 1 MG/DOSE, 4 MG/3ML SOPN  09/27/19  Yes [provider]  rosuvastatin (CRESTOR) 10 MG tablet  09/26/19  Yes [provider]  traMADol (ULTRAM) 50 MG tablet Take 1 tablet (50 mg total) by mouth every 6 (six) hours as needed. 12/07/20  Yes Margarette Canada, NP   Physical Exam: Vitals:   04/01/21 1945 04/01/21 2003 04/01/21 2040 04/01/21 2225  BP: (!) 100/41 (!) 90/40 122/60 130/70  Pulse: 97 86  99  Resp: 18 18  20   Temp: 99.1 F (37.3 C) 98.3 F (36.8 C)  99.5 F (37.5 C)  TempSrc: Oral Oral  Oral  SpO2: 98% 97%  98%  Weight:      Height:       Constitutional: appears age-appropriate, frail, NAD, calm, comfortable Eyes: PERRL, lids and conjunctivae normal ENMT: Mucous membranes are moist. Posterior pharynx clear of any exudate or lesions. Age-appropriate dentition. Hearing appropriate Neck: normal, supple, no masses, no thyromegaly Respiratory: clear to auscultation bilaterally, no wheezing, no crackles. Normal respiratory effort. No accessory muscle use.  Cardiovascular: Regular rate and rhythm, no murmurs / rubs / gallops. + Bilateral lower extremity edema.  Left lower extremity tenderness compared to the right.  2+ pedal pulses. No carotid bruits.  Abdomen: Morbidly obese abdomen, no tenderness, no masses palpated, no hepatosplenomegaly. Bowel sounds positive.  Musculoskeletal: no clubbing / cyanosis. No joint deformity upper and lower extremities. Good ROM, no contractures, no atrophy. Normal muscle tone.  Skin: no rashes, lesions, ulcers. No  induration Neurologic: Sensation intact. Strength 5/5 in all 4.  Psychiatric: Normal judgment and insight. Alert and oriented x 3. Normal mood.   EKG: independently reviewed, showing normal sinus rhythm with rate of 91, QTc 435  Chest x-ray on Admission: per primary team after completion of blood infusion.  Labs on Admission: I have personally reviewed following labs  CBC: Recent Labs  Lab 04/01/21 1736  WBC 11.5*  HGB 7.0*  HCT 21.7*  MCV 90.0  PLT 073   Basic Metabolic Panel: Recent Labs  Lab 04/01/21 1736  NA 134*  K 5.1  CL 103  CO2 21*  GLUCOSE 244*  BUN 58*  CREATININE 2.58*  CALCIUM 8.8*   GFR: Estimated Creatinine Clearance: 29.3 mL/min (A) (by C-G formula based on SCr of 2.58 mg/dL (H)).  Liver Function Tests: Recent Labs  Lab 04/01/21 1736  AST 15  ALT 11  ALKPHOS 59  BILITOT 0.3  PROT 6.8  ALBUMIN 3.7   CBG: Recent Labs  Lab 04/01/21 1716 04/01/21 2157  GLUCAP 238* 176*  Urine analysis:    Component Value Date/Time   COLORURINE AMBER (A) 12/07/2020 1609   APPEARANCEUR Cloudy (A) 01/14/2021 1051   LABSPEC 1.010 12/07/2020 1609   PHURINE 5.5 12/07/2020 1609   GLUCOSEU 1+ (A) 01/14/2021 1051   HGBUR LARGE (A) 12/07/2020 1609   BILIRUBINUR Negative 01/14/2021 [place] 12/07/2020 1609   PROTEINUR Negative 01/14/2021 1051   PROTEINUR NEGATIVE 12/07/2020 1609   NITRITE Negative 01/14/2021 1051   NITRITE NEGATIVE 12/07/2020 1609   LEUKOCYTESUR Negative 01/14/2021 1051   LEUKOCYTESUR NEGATIVE 12/07/2020 1609   Dr. Tobie Poet [facility]  If 7PM-7AM, please contact overnight-coverage provider If 7AM-7PM, please contact day coverage provider [email or web address]  04/01/2021, 10:59 PM

## 2021-04-02 ENCOUNTER — Inpatient Hospital Stay: Payer: Medicare Other

## 2021-04-02 DIAGNOSIS — R600 Localized edema: Secondary | ICD-10-CM

## 2021-04-02 DIAGNOSIS — I10 Essential (primary) hypertension: Secondary | ICD-10-CM

## 2021-04-02 DIAGNOSIS — F32A Depression, unspecified: Secondary | ICD-10-CM

## 2021-04-02 DIAGNOSIS — N179 Acute kidney failure, unspecified: Secondary | ICD-10-CM

## 2021-04-02 DIAGNOSIS — R634 Abnormal weight loss: Secondary | ICD-10-CM

## 2021-04-02 DIAGNOSIS — Z794 Long term (current) use of insulin: Secondary | ICD-10-CM

## 2021-04-02 DIAGNOSIS — D62 Acute posthemorrhagic anemia: Secondary | ICD-10-CM | POA: Diagnosis not present

## 2021-04-02 DIAGNOSIS — E119 Type 2 diabetes mellitus without complications: Secondary | ICD-10-CM

## 2021-04-02 LAB — COMPREHENSIVE METABOLIC PANEL
ALT: 12 U/L (ref 0–44)
AST: 14 U/L — ABNORMAL LOW (ref 15–41)
Albumin: 3.3 g/dL — ABNORMAL LOW (ref 3.5–5.0)
Alkaline Phosphatase: 61 U/L (ref 38–126)
Anion gap: 10 (ref 5–15)
BUN: 55 mg/dL — ABNORMAL HIGH (ref 8–23)
CO2: 21 mmol/L — ABNORMAL LOW (ref 22–32)
Calcium: 8.7 mg/dL — ABNORMAL LOW (ref 8.9–10.3)
Chloride: 107 mmol/L (ref 98–111)
Creatinine, Ser: 1.76 mg/dL — ABNORMAL HIGH (ref 0.44–1.00)
GFR, Estimated: 31 mL/min — ABNORMAL LOW (ref >60)
Glucose, Bld: 215 mg/dL — ABNORMAL HIGH (ref 70–99)
Potassium: 4.5 mmol/L (ref 3.5–5.1)
Sodium: 138 mmol/L (ref 135–145)
Total Bilirubin: 0.5 mg/dL (ref 0.3–1.2)
Total Protein: 6.4 g/dL — ABNORMAL LOW (ref 6.5–8.1)

## 2021-04-02 LAB — CBC
HCT: 25.2 % — ABNORMAL LOW (ref 36.0–46.0)
Hemoglobin: 8.4 g/dL — ABNORMAL LOW (ref 12.0–15.0)
MCH: 28.3 pg (ref 26.0–34.0)
MCHC: 33.3 g/dL (ref 30.0–36.0)
MCV: 84.8 fL (ref 80.0–100.0)
Platelets: 250 10*3/uL (ref 150–400)
RBC: 2.97 MIL/uL — ABNORMAL LOW (ref 3.87–5.11)
RDW: 17 % — ABNORMAL HIGH (ref 11.5–15.5)
WBC: 11.2 10*3/uL — ABNORMAL HIGH (ref 4.0–10.5)
nRBC: 0 % (ref 0.0–0.2)

## 2021-04-02 LAB — IRON AND TIBC
Iron: 121 ug/dL (ref 28–170)
Saturation Ratios: 37 % — ABNORMAL HIGH (ref 10.4–31.8)
TIBC: 332 ug/dL (ref 250–450)
UIBC: 211 ug/dL

## 2021-04-02 LAB — GLUCOSE, CAPILLARY
Glucose-Capillary: 196 mg/dL — ABNORMAL HIGH (ref 70–99)
Glucose-Capillary: 193 mg/dL — ABNORMAL HIGH (ref 70–99)
Glucose-Capillary: 301 mg/dL — ABNORMAL HIGH (ref 70–99)
Glucose-Capillary: 213 mg/dL — ABNORMAL HIGH (ref 70–99)

## 2021-04-02 LAB — HEMOGLOBIN A1C
Hgb A1c MFr Bld: 6.2 % — ABNORMAL HIGH (ref 4.8–5.6)
Mean Plasma Glucose: 131.24 mg/dL

## 2021-04-02 LAB — FERRITIN: Ferritin: 37 ng/mL (ref 11–307)

## 2021-04-02 MED ORDER — SODIUM CHLORIDE 0.9 % IV SOLN
125.0000 mg | Freq: Once | INTRAVENOUS | Status: AC
  Administered 2021-04-02: 125 mg via INTRAVENOUS
  Filled 2021-04-02: qty 10

## 2021-04-02 MED ORDER — INSULIN ASPART PROT & ASPART (70-30 MIX) 100 UNIT/ML ~~LOC~~ SUSP
15.0000 [IU] | Freq: Two times a day (BID) | SUBCUTANEOUS | Status: DC
  Administered 2021-04-02 – 2021-04-03 (×3): 15 [IU] via SUBCUTANEOUS
  Filled 2021-04-02: qty 10

## 2021-04-02 MED ORDER — INSULIN ASPART 100 UNIT/ML IJ SOLN: 0.0000 [IU] | Freq: Every day | INTRAMUSCULAR | Status: DC

## 2021-04-02 MED ORDER — INSULIN ASPART 100 UNIT/ML IJ SOLN
0.0000 [IU] | Freq: Three times a day (TID) | INTRAMUSCULAR | Status: DC
  Administered 2021-04-02: 11 [IU] via SUBCUTANEOUS
  Administered 2021-04-02 – 2021-04-03 (×2): 3 [IU] via SUBCUTANEOUS
  Filled 2021-04-02 (×3): qty 1

## 2021-04-02 MED ORDER — SODIUM CHLORIDE 0.9 % IV SOLN: INTRAVENOUS | Status: AC

## 2021-04-02 MED ORDER — SODIUM CHLORIDE 0.9 % IV SOLN: INTRAVENOUS | Status: DC

## 2021-04-02 NOTE — Progress Notes (Signed)
Daily Benign Gynecology Progress Note Valerie Case  [id number]  HD#[id number]  Subjective:  Overnight Events: No acute events. Receive 2 units pRBCs. Negative CXR, negative bilateral lower extremity doppler.   Complaints: feels weak She denies: fevers, chills, chest pain, trouble breathing, nausea, vomiting, severe abdominal pain.  Her bleeding has subsided considerably. She now notes only a smear on the pad.  This is a huge improvement to her prior bleeding. She still feels weak.   She has tolerated: carb-modified diet She reports her pain is minimal.   She is ambulating and is voiding.   Objective:  Most recent vitals Temp: 98.8 F (37.1 C)  BP: 123/65  Pulse Rate: 85  Resp: 20  SpO2: 100 %   Vitals Range over 24 hours Temp  Avg: 98.7 F (37.1 C)  Min: 97.9 F (36.6 C)  Max: 99.5 F (37.5 C) BP  Min: 90/40  Max: 131/65 Pulse  Avg: 89.8  Min: 81  Max: 99 SpO2  Avg: 97.8 %  Min: 96 %  Max: 100 %   Physical Exam General: alert, well appearing, and in no distress and overweight Heart: regular rate and rhythm, no murmurs, murmur II-III/VI SEM Lungs: clear to auscultation, no wheezes, rales or rhonchi, symmetric air entry Abdomen: not examined. Patient in sitting position Extremities: no redness or tenderness in the calves or thighs, no edema  AM Labs Lab Results  Component Value Date   WBC 11.2 (H) 04/02/2021   HGB 8.4 (L) 04/02/2021   HCT 25.2 (L) 04/02/2021   PLT 250 04/02/2021   NA 138 04/02/2021   K 4.5 04/02/2021   CREATININE 1.76 (H) 04/02/2021   BUN 55 (H) 04/02/2021     Assessment:  Valerie Case is a 71 y.o. female HD2 admitted with heavy postmenopausal bleeding, symptomatic anemia from acute blood loss, acute kidney injury.    Plan:  Long discussion with the patient regarding her clinical course so far and ongoing workup for her postmenopausal bleeding. Endometrial biopsy is pending. She has responded very well to oral provera.  She is to be discharged on a  7 day course of provera 20 mg po tid.  She is fine for discharge from a gynecology standpoint. She is being followed by the hospitalist for AKI and her comorbid conditions. The hospitalist would like to watch her an additional day. So, she will remain inpatient for that.  Follow up with general medicine, per hospitalist recommendations.  Anticipated Discharge: tomorrow  Prentice Docker, MD  04/02/2021 12:57 PM

## 2021-04-02 NOTE — Assessment & Plan Note (Signed)
-   Follow duplex

## 2021-04-02 NOTE — Assessment & Plan Note (Addendum)
BP controlled here.

## 2021-04-02 NOTE — Assessment & Plan Note (Addendum)
Per Ob-Gyn, with renal function normalized, standard conservative threshold for transfusion is appropriate.

## 2021-04-02 NOTE — Progress Notes (Signed)
°  Progress Note   Patient: Valerie Case TXH:[id number] DOB: December 20, 1950 DOA: 04/01/2021     1 DOS: the patient was seen and examined on 04/02/2021   Brief hospital course: Mrs. Valerie Case is a 71 y.o. F with HTN, DM, depression, obesity and sleep apnea and recent post-menopausal bleeding who was admitted for anemia and renal failure.  Patient was evaluated by Gyn yesterday for vaginal bleeding, reported soaking through a pad an hour for a week, and starting to feel fatigued.  Hgb in clinic 6.9 g/dL and Cr 2.58 from baseline 0.9 mg/dL.  Admitted for transfusion, medical treatment of vaginal bleeding, Medicine consulted for AKI and diabetes management.  Assessment and Plan * AKI (acute kidney injury) ([place])- (present on admission) Improving with fluids and transfusion.  -Continue IV fluids -Hold nephrotoxins - Strict I/Os  -Hold SGLT2 inhib and ACE  Symptomatic anemia- (present on admission) Transfused 2 units overnight. Iron low normal in Nov., heavier bleeding now - With renal function improving, okay to target more conservative transfusion threshold 7 g/dL - Check iron studies   Bilateral lower extremity edema- (present on admission) - Follow duplex  Insulin dependent type 2 diabetes mellitus (HCC) Glucose elevated - Resume 70/30 at lower than home dose - SS corrections -Hold metformin - Okay to continue semaglutide -Continue Crestor  Essential hypertension- (present on admission) BP controlled -Continue metoprolol -Hold HCTZ and ACE     Subjective: Feeling somewhat better, still very weak.  No swelling, confusion.  Objective Vital signs were reviewed and unremarkable. , Sitting up in recliner, no acute distress, interactive.  Heart rate regular, no murmurs, mild lower extremity edema and puffiness of the feet, no pitting.  Lung sounds normal, no rales or wheezes.  Normal mentation, attention normal, judgment insight appear normal  Data Reviewed:  Labs notable  for elevated glucose, creatinine down to 1.76 from 2.58, hemoglobin of 8.4, ultrasound of the legs bilaterally normal.    Disposition: Status is: Inpatient  Remains inpatient appropriate because: She requires ongoing fluids, and monitoring of renal function for her acute kidney injury.  Likely home tomorrow.            Author: Edwin Dada, MD 04/02/2021 5:23 PM  For on call review [email or web address].

## 2021-04-02 NOTE — Assessment & Plan Note (Addendum)
Cr 2.6 on admission in setting of anemia and lisinopril/Jardiance use.  Meds held, fluids and transfusions given.  Cr 2.6 > 1.7 > 1.1 today, near baseline 0.9.    Safe for discharge from renal standpoint.  Follow up with Dr. Ginette Pitman in 1 week for BMP.  Hold lisinopril and Jardiance and metformin for now  Okay to resume HCTZ at this time, as well as other meds.

## 2021-04-02 NOTE — Progress Notes (Signed)
Obstetric and Gynecology  POD/HD # 2  Subjective  Patient doing well, no complaints, tolerating PO intake, tolerating pain with PO meds, ambulating without difficulty, voiding spontaneously.     Denies CP, SOB, F/C, N/V/D, or leg pain.   Objective   Objective:   Vitals:   04/02/21 0030 04/02/21 0055 04/02/21 0350 04/02/21 0734  BP: (!) 124/58 120/60 (!) 118/58 131/65  Pulse: 90 84 81 88  Resp: 18 18 18 18   Temp: 98.4 F (36.9 C) 97.9 F (36.6 C) 98.6 F (37 C) 98.1 F (36.7 C)  TempSrc: Oral Oral Oral Oral  SpO2: 98% 96% 96% 100%  Weight:      Height:       Temp:  [97.9 F (36.6 C)-99.5 F (37.5 C)] 98.1 F (36.7 C) (01/17 0734) Pulse Rate:  [81-99] 88 (01/17 0734) Resp:  [18-20] 18 (01/17 0734) BP: (90-131)/(40-70) 131/65 (01/17 0734) SpO2:  [96 %-100 %] 100 % (01/17 0734) Weight:  [129.7 kg] 129.7 kg (01/16 1708) I/O last 3 completed shifts: In: [place]: 0175 [ZWCHE:5277; Blood:30] Total I/O In: 881.9 [P.O.:240; I.V.:641.9] Out: 300 [Urine:300]  Intake/Output Summary (Last 24 hours) at 04/02/2021 0905 Last data filed at 04/02/2021 0829 Gross per 24 hour  Intake 1575.94 ml  Output 2080 ml  Net -504.06 ml     Current Vital Signs 24h Vital Sign Ranges  T 98.1 F (36.7 C) Temp  Avg: 98.7 F (37.1 C)  Min: 97.9 F (36.6 C)  Max: 99.5 F (37.5 C)  BP 131/65 BP  Min: 90/40  Max: 131/65  HR 88 Pulse  Avg: 90.4  Min: 81  Max: 99  RR 18 Resp  Avg: 18.3  Min: 18  Max: 20  SaO2 100 % Room Air SpO2  Avg: 97.8 %  Min: 96 %  Max: 100 %           24 Hour I/O Current Shift I/O  Time Ins Outs 01/16 0701 - 01/17 0700 In: 824  Out: 2353 [Urine:1750] 01/17 0701 - 01/17 1900 In: 881.9 [P.O.:240; I.V.:641.9] Out: 300 [Urine:300]   General: NAD Cardiovascular: RRR Pulmonary: normal respiratory effort Abdomen: Benign, non-tender Extremities: No erythema or cords, no calf tenderness  Labs: Results for orders placed or performed during the hospital  encounter of 04/01/21 (from the past 24 hour(s))  Prepare RBC (crossmatch)     Status: None   Collection Time: 04/01/21  4:49 PM  Result Value Ref Range   Order Confirmation      ORDER PROCESSED BY BLOOD BANK Performed at [facility], [address] [place], [place] [address]   Glucose, capillary     Status: Abnormal   Collection Time: 04/01/21  5:16 PM  Result Value Ref Range   Glucose-Capillary 238 (H) 70 - 99 mg/dL  Resp Panel by RT-PCR (Flu A&B, Covid) Nasopharyngeal Swab     Status: None   Collection Time: 04/01/21  5:20 PM   Specimen: Nasopharyngeal Swab; Nasopharyngeal(NP) swabs in vial transport medium  Result Value Ref Range   SARS Coronavirus 2 by RT PCR NEGATIVE NEGATIVE   Influenza A by PCR NEGATIVE NEGATIVE   Influenza B by PCR NEGATIVE NEGATIVE  Comprehensive metabolic panel     Status: Abnormal   Collection Time: 04/01/21  5:36 PM  Result Value Ref Range   Sodium 134 (L) 135 - 145 mmol/L   Potassium 5.1 3.5 - 5.1 mmol/L   Chloride 103 98 - 111 mmol/L   CO2 21 (L)  22 - 32 mmol/L   Glucose, Bld 244 (H) 70 - 99 mg/dL   BUN 58 (H) 8 - 23 mg/dL   Creatinine, Ser 2.58 (H) 0.44 - 1.00 mg/dL   Calcium 8.8 (L) 8.9 - 10.3 mg/dL   Total Protein 6.8 6.5 - 8.1 g/dL   Albumin 3.7 3.5 - 5.0 g/dL   AST 15 15 - 41 U/L   ALT 11 0 - 44 U/L   Alkaline Phosphatase 59 38 - 126 U/L   Total Bilirubin 0.3 0.3 - 1.2 mg/dL   GFR, Estimated 20 (L) >60 mL/min   Anion gap 10 5 - 15  CBC     Status: Abnormal   Collection Time: 04/01/21  5:36 PM  Result Value Ref Range   WBC 11.5 (H) 4.0 - 10.5 K/uL   RBC 2.41 (L) 3.87 - 5.11 MIL/uL   Hemoglobin 7.0 (L) 12.0 - 15.0 g/dL   HCT 21.7 (L) 36.0 - 46.0 %   MCV 90.0 80.0 - 100.0 fL   MCH 29.0 26.0 - 34.0 pg   MCHC 32.3 30.0 - 36.0 g/dL   RDW 13.9 11.5 - 15.5 %   Platelets 301 150 - 400 K/uL   nRBC 0.0 0.0 - 0.2 %  Type and screen     Status: None (Preliminary result)   Collection Time: 04/01/21  5:36 PM  Result Value Ref  Range   ABO/RH(D) A POS    Antibody Screen NEG    Sample Expiration 04/04/2021,2359    Unit Number [id number]    Blood Component Type RED CELLS,LR    Unit division 00    Status of Unit ISSUED    Transfusion Status OK TO TRANSFUSE    Crossmatch Result Compatible    Unit Number [id number]    Blood Component Type RED CELLS,LR    Unit division 00    Status of Unit ISSUED    Transfusion Status OK TO TRANSFUSE    Crossmatch Result      Compatible Performed at [facility], [address] [place], [place] [address]   ABO/Rh     Status: None   Collection Time: 04/01/21  5:45 PM  Result Value Ref Range   ABO/RH(D)      A POS Performed at [facility], [address] [place], [place] [address]   Glucose, capillary     Status: Abnormal   Collection Time: 04/01/21  9:57 PM  Result Value Ref Range   Glucose-Capillary 176 (H) 70 - 99 mg/dL  Hemoglobin and hematocrit, blood     Status: Abnormal   Collection Time: 04/01/21 11:21 PM  Result Value Ref Range   Hemoglobin 7.4 (L) 12.0 - 15.0 g/dL   HCT 22.8 (L) 36.0 - 46.0 %  Comprehensive metabolic panel     Status: Abnormal   Collection Time: 04/02/21  6:42 AM  Result Value Ref Range   Sodium 138 135 - 145 mmol/L   Potassium 4.5 3.5 - 5.1 mmol/L   Chloride 107 98 - 111 mmol/L   CO2 21 (L) 22 - 32 mmol/L   Glucose, Bld 215 (H) 70 - 99 mg/dL   BUN 55 (H) 8 - 23 mg/dL   Creatinine, Ser 1.76 (H) 0.44 - 1.00 mg/dL   Calcium 8.7 (L) 8.9 - 10.3 mg/dL   Total Protein 6.4 (L) 6.5 - 8.1 g/dL   Albumin 3.3 (L) 3.5 - 5.0 g/dL   AST 14 (L) 15 - 41 U/L   ALT 12 0 -  44 U/L   Alkaline Phosphatase 61 38 - 126 U/L   Total Bilirubin 0.5 0.3 - 1.2 mg/dL   GFR, Estimated 31 (L) >60 mL/min   Anion gap 10 5 - 15  CBC     Status: Abnormal   Collection Time: 04/02/21  6:42 AM  Result Value Ref Range   WBC 11.2 (H) 4.0 - 10.5 K/uL   RBC 2.97 (L) 3.87 - 5.11 MIL/uL   Hemoglobin 8.4 (L) 12.0 - 15.0 g/dL   HCT 25.2 (L) 36.0 -  46.0 %   MCV 84.8 80.0 - 100.0 fL   MCH 28.3 26.0 - 34.0 pg   MCHC 33.3 30.0 - 36.0 g/dL   RDW 17.0 (H) 11.5 - 15.5 %   Platelets 250 150 - 400 K/uL   nRBC 0.0 0.0 - 0.2 %  Ferritin     Status: None   Collection Time: 04/02/21  6:42 AM  Result Value Ref Range   Ferritin 37 11 - 307 ng/mL  Iron and TIBC     Status: Abnormal   Collection Time: 04/02/21  6:42 AM  Result Value Ref Range   Iron 121 28 - 170 ug/dL   TIBC 332 250 - 450 ug/dL   Saturation Ratios 37 (H) 10.4 - 31.8 %   UIBC 211 ug/dL  Glucose, capillary     Status: Abnormal   Collection Time: 04/02/21  8:56 AM  Result Value Ref Range   Glucose-Capillary 196 (H) 70 - 99 mg/dL    Cultures: Results for orders placed or performed during the hospital encounter of 04/01/21  Resp Panel by RT-PCR (Flu A&B, Covid) Nasopharyngeal Swab     Status: None   Collection Time: 04/01/21  5:20 PM   Specimen: Nasopharyngeal Swab; Nasopharyngeal(NP) swabs in vial transport medium  Result Value Ref Range Status   SARS Coronavirus 2 by RT PCR NEGATIVE NEGATIVE Final    Comment: (NOTE) SARS-CoV-2 target nucleic acids are NOT DETECTED.  The SARS-CoV-2 RNA is generally detectable in upper respiratory specimens during the acute phase of infection. The lowest concentration of SARS-CoV-2 viral copies this assay can detect is 138 copies/mL. A negative result does not preclude SARS-Cov-2 infection and should not be used as the sole basis for treatment or other patient management decisions. A negative result may occur with  improper specimen collection/handling, submission of specimen other than nasopharyngeal swab, presence of viral mutation(s) within the areas targeted by this assay, and inadequate number of viral copies(<138 copies/mL). A negative result must be combined with clinical observations, patient history, and epidemiological information. The expected result is Negative.  Fact Sheet for Patients:   EntrepreneurPulse.com.au  Fact Sheet for Healthcare Providers:  IncredibleEmployment.be  This test is no t yet approved or cleared by the [place] FDA and  has been authorized for detection and/or diagnosis of SARS-CoV-2 by FDA under an Emergency Use Authorization (EUA). This EUA will remain  in effect (meaning this test can be used) for the duration of the COVID-19 declaration under Section 564(b)(1) of the Act, 21 U.S.C.section 360bbb-3(b)(1), unless the authorization is terminated  or revoked sooner.       Influenza A by PCR NEGATIVE NEGATIVE Final   Influenza B by PCR NEGATIVE NEGATIVE Final    Comment: (NOTE) The Xpert Xpress SARS-CoV-2/FLU/RSV plus assay is intended as an aid in the diagnosis of influenza from Nasopharyngeal swab specimens and should not be used as a sole basis for treatment. Nasal washings and aspirates are unacceptable for Xpert  Xpress SARS-CoV-2/FLU/RSV testing.  Fact Sheet for Patients: EntrepreneurPulse.com.au  Fact Sheet for Healthcare Providers: IncredibleEmployment.be  This test is not yet approved or cleared by the [place] FDA and has been authorized for detection and/or diagnosis of SARS-CoV-2 by FDA under an Emergency Use Authorization (EUA). This EUA will remain in effect (meaning this test can be used) for the duration of the COVID-19 declaration under Section 564(b)(1) of the Act, 21 U.S.C. section 360bbb-3(b)(1), unless the authorization is terminated or revoked.  Performed at [facility], [address], [place], [place] [address]     Imaging: US Venous Img Lower Bilateral (DVT)  Result Date: 04/02/2021 CLINICAL DATA:  Bilateral lower extremity edema. EXAM: BILATERAL LOWER EXTREMITY VENOUS DOPPLER ULTRASOUND TECHNIQUE: Gray-scale sonography with graded compression, as well as color Doppler and duplex ultrasound were performed to evaluate  the lower extremity deep venous systems from the level of the common femoral vein and including the common femoral, femoral, profunda femoral, popliteal and calf veins including the posterior tibial, peroneal and gastrocnemius veins when visible. The superficial great saphenous vein was also interrogated. Spectral Doppler was utilized to evaluate flow at rest and with distal augmentation maneuvers in the common femoral, femoral and popliteal veins. COMPARISON:  Prior left lower extremity venous ultrasound on 12/26/2019 FINDINGS: RIGHT LOWER EXTREMITY Common Femoral Vein: No evidence of thrombus. Normal compressibility, respiratory phasicity and response to augmentation. Saphenofemoral Junction: No evidence of thrombus. Normal compressibility and flow on color Doppler imaging. Profunda Femoral Vein: No evidence of thrombus. Normal compressibility and flow on color Doppler imaging. Femoral Vein: No evidence of thrombus. Normal compressibility, respiratory phasicity and response to augmentation. Popliteal Vein: No evidence of thrombus. Normal compressibility, respiratory phasicity and response to augmentation. Calf Veins: No evidence of thrombus. Normal compressibility and flow on color Doppler imaging. Superficial Great Saphenous Vein: No evidence of thrombus. Normal compressibility. Venous Reflux:  None. Other Findings: No evidence of superficial thrombophlebitis or abnormal fluid collection. LEFT LOWER EXTREMITY Common Femoral Vein: No evidence of thrombus. Normal compressibility, respiratory phasicity and response to augmentation. Saphenofemoral Junction: No evidence of thrombus. Normal compressibility and flow on color Doppler imaging. Profunda Femoral Vein: No evidence of thrombus. Normal compressibility and flow on color Doppler imaging. Femoral Vein: No evidence of thrombus. Normal compressibility, respiratory phasicity and response to augmentation. Popliteal Vein: No evidence of thrombus. Normal  compressibility, respiratory phasicity and response to augmentation. Calf Veins: No evidence of thrombus. Normal compressibility and flow on color Doppler imaging. Superficial Great Saphenous Vein: No evidence of thrombus. Normal compressibility. Venous Reflux:  None. Other Findings: No evidence of superficial thrombophlebitis or abnormal fluid collection. IMPRESSION: No evidence of deep venous thrombosis in either lower extremity. Electronically Signed   By: Aletta Edouard M.D.   On: 04/02/2021 08:32   DG Chest Port 1 View  Result Date: 04/01/2021 CLINICAL DATA:  Encounter for blood transfusion EXAM: PORTABLE CHEST 1 VIEW COMPARISON:  None. FINDINGS: Single frontal view of the chest demonstrates an unremarkable cardiac silhouette. No airspace disease, effusion, or pneumothorax. No acute bony abnormalities. IMPRESSION: 1. No acute intrathoracic process. Electronically Signed   By: Randa Ngo M.D.   On: 04/01/2021 23:57     Assessment   71 y.o. [place] Day: 2   S/p Provera 20mg  q8hr S/p Endometrial biopsy S/p 2 units of pRBC Seen by hospitalist yesterday Hgb  1/16 1736 7.0  1/16 2321 7.4  1/17 0642 8.4 Scant bleeding and improved pain today  Plan   1. Advanced  diet this am to Carb Modified Regular Diet 2. Dr. Glennon Mac updated this am and will round on her later today

## 2021-04-02 NOTE — Hospital Course (Signed)
Mrs. Meneely is a 71 y.o. F with HTN, DM, depression, obesity and sleep apnea and recent post-menopausal bleeding who was admitted for anemia and renal failure.  Patient was evaluated by Gyn yesterday for vaginal bleeding, reported soaking through a pad an hour for a week, and starting to feel fatigued.  Hgb in clinic 6.9 g/dL and Cr 2.58 from baseline 0.9 mg/dL.  Admitted for transfusion, medical treatment of vaginal bleeding, Medicine consulted for AKI and diabetes management.

## 2021-04-02 NOTE — Progress Notes (Signed)
Pt to ultrasound via wc accompanied by tech

## 2021-04-03 DIAGNOSIS — D62 Acute posthemorrhagic anemia: Secondary | ICD-10-CM | POA: Diagnosis not present

## 2021-04-03 DIAGNOSIS — R600 Localized edema: Secondary | ICD-10-CM | POA: Diagnosis not present

## 2021-04-03 DIAGNOSIS — I10 Essential (primary) hypertension: Secondary | ICD-10-CM | POA: Diagnosis not present

## 2021-04-03 DIAGNOSIS — F32A Depression, unspecified: Secondary | ICD-10-CM | POA: Diagnosis not present

## 2021-04-03 DIAGNOSIS — N179 Acute kidney failure, unspecified: Secondary | ICD-10-CM | POA: Diagnosis not present

## 2021-04-03 LAB — BASIC METABOLIC PANEL
Anion gap: 4 — ABNORMAL LOW (ref 5–15)
BUN: 49 mg/dL — ABNORMAL HIGH (ref 8–23)
CO2: 24 mmol/L (ref 22–32)
Calcium: 8.6 mg/dL — ABNORMAL LOW (ref 8.9–10.3)
Chloride: 107 mmol/L (ref 98–111)
Creatinine, Ser: 1.11 mg/dL — ABNORMAL HIGH (ref 0.44–1.00)
GFR, Estimated: 54 mL/min — ABNORMAL LOW (ref >60)
Glucose, Bld: 221 mg/dL — ABNORMAL HIGH (ref 70–99)
Potassium: 4.2 mmol/L (ref 3.5–5.1)
Sodium: 135 mmol/L (ref 135–145)

## 2021-04-03 LAB — TYPE AND SCREEN
ABO/RH(D): A POS
Antibody Screen: NEGATIVE
Unit division: 0
Unit division: 0

## 2021-04-03 LAB — CBC
HCT: 23.7 % — ABNORMAL LOW (ref 36.0–46.0)
Hemoglobin: 7.7 g/dL — ABNORMAL LOW (ref 12.0–15.0)
MCH: 28 pg (ref 26.0–34.0)
MCHC: 32.5 g/dL (ref 30.0–36.0)
MCV: 86.2 fL (ref 80.0–100.0)
Platelets: 264 10*3/uL (ref 150–400)
RBC: 2.75 MIL/uL — ABNORMAL LOW (ref 3.87–5.11)
RDW: 17 % — ABNORMAL HIGH (ref 11.5–15.5)
WBC: 11.8 10*3/uL — ABNORMAL HIGH (ref 4.0–10.5)
nRBC: 0 % (ref 0.0–0.2)

## 2021-04-03 LAB — BPAM RBC
Blood Product Expiration Date: 202302092359
Blood Product Expiration Date: 202302092359
ISSUE DATE / TIME: 202301161936
ISSUE DATE / TIME: 202301170032
Unit Type and Rh: 6200
Unit Type and Rh: 6200

## 2021-04-03 LAB — GLUCOSE, CAPILLARY: Glucose-Capillary: 194 mg/dL — ABNORMAL HIGH (ref 70–99)

## 2021-04-03 MED ORDER — MEDROXYPROGESTERONE ACETATE 10 MG PO TABS: 20.0000 mg | ORAL_TABLET | Freq: Three times a day (TID) | ORAL | 0 refills | Status: DC

## 2021-04-03 MED ORDER — TRAMADOL HCL 50 MG PO TABS: 50.0000 mg | ORAL_TABLET | Freq: Every day | ORAL | 0 refills | Status: AC

## 2021-04-03 NOTE — Discharge Instructions (Signed)
From Dr. Loleta Books: You were admitted for anemia from uterine bleeding.  In addition, we noticed that you had acute kidney injury.  Thankfully, this resolved with fluids.  For now: You may RESUME you metoprolol, HCTZ, Ozempic, Crestor and 70/30 insulin  For now: HOLD (do not take) your lisinopril, Jardiance or metformin until you see Dr. Ginette Pitman, he checks your kidney function, and he tells you you can resume them  You can resume the Ozempic this evening then go back to your normal schedule  You may have some mild welling in the feet that takes a while to get better (and might not get better until you restart your Jardiance)

## 2021-04-03 NOTE — Discharge Summary (Signed)
Obstetric and Gynecology  Subjective  Valerie Case is a 71 y.o. female G26P1001 who presented on 04/01/2021 for postmenopausal bleeding and symptomatic anemia. She is currently resting in bed.Patient reports that overall she feels better, but does feel a little weak and sleep deprived.She also states that the Provera has helped drastically decrease her bleeding and she feels a lot better after the iron and blood transfusion.     Objective   Vitals:   04/03/21 0747 04/03/21 1151  BP: (!) 128/47 123/65  Pulse: 81 85  Resp: 18 20  Temp: 98.5 F (36.9 C) 98.4 F (36.9 C)  SpO2: 98%      Intake/Output Summary (Last 24 hours) at 04/03/2021 1237 Last data filed at 04/03/2021 0900 Gross per 24 hour  Intake 685.74 ml  Output 1050 ml  Net -364.26 ml     General: NAD Cardiovascular: RRR, no murmurs Pulmonary: CTAB Abdomen: Benign. Non-tender, +BS, no guarding. Extremities: No erythema or cords, no calf tenderness, +warmth with normal peripheral pulses.  Labs: Results for orders placed or performed during the hospital encounter of 04/01/21 (from the past 24 hour(s))  Glucose, capillary     Status: Abnormal   Collection Time: 04/02/21  1:29 PM  Result Value Ref Range   Glucose-Capillary 193 (H) 70 - 99 mg/dL  Glucose, capillary     Status: Abnormal   Collection Time: 04/02/21  5:49 PM  Result Value Ref Range   Glucose-Capillary 301 (H) 70 - 99 mg/dL  Glucose, capillary     Status: Abnormal   Collection Time: 04/02/21  9:56 PM  Result Value Ref Range   Glucose-Capillary 213 (H) 70 - 99 mg/dL   Comment 1 Notify RN   CBC     Status: Abnormal   Collection Time: 04/03/21  4:40 AM  Result Value Ref Range   WBC 11.8 (H) 4.0 - 10.5 K/uL   RBC 2.75 (L) 3.87 - 5.11 MIL/uL   Hemoglobin 7.7 (L) 12.0 - 15.0 g/dL   HCT 23.7 (L) 36.0 - 46.0 %   MCV 86.2 80.0 - 100.0 fL   MCH 28.0 26.0 - 34.0 pg   MCHC 32.5 30.0 - 36.0 g/dL   RDW 17.0 (H) 11.5 - 15.5 %   Platelets 264 150 - 400  K/uL   nRBC 0.0 0.0 - 0.2 %  Basic metabolic panel     Status: Abnormal   Collection Time: 04/03/21  4:40 AM  Result Value Ref Range   Sodium 135 135 - 145 mmol/L   Potassium 4.2 3.5 - 5.1 mmol/L   Chloride 107 98 - 111 mmol/L   CO2 24 22 - 32 mmol/L   Glucose, Bld 221 (H) 70 - 99 mg/dL   BUN 49 (H) 8 - 23 mg/dL   Creatinine, Ser 1.11 (H) 0.44 - 1.00 mg/dL   Calcium 8.6 (L) 8.9 - 10.3 mg/dL   GFR, Estimated 54 (L) >60 mL/min   Anion gap 4 (L) 5 - 15  Glucose, capillary     Status: Abnormal   Collection Time: 04/03/21  8:22 AM  Result Value Ref Range   Glucose-Capillary 194 (H) 70 - 99 mg/dL   Comment 1 Notify RN     Cultures: Results for orders placed or performed during the hospital encounter of 04/01/21  Resp Panel by RT-PCR (Flu A&B, Covid) Nasopharyngeal Swab     Status: None   Collection Time: 04/01/21  5:20 PM   Specimen: Nasopharyngeal Swab; Nasopharyngeal(NP) swabs in vial transport medium  Result Value Ref Range Status   SARS Coronavirus 2 by RT PCR NEGATIVE NEGATIVE Final    Comment: (NOTE) SARS-CoV-2 target nucleic acids are NOT DETECTED.  The SARS-CoV-2 RNA is generally detectable in upper respiratory specimens during the acute phase of infection. The lowest concentration of SARS-CoV-2 viral copies this assay can detect is 138 copies/mL. A negative result does not preclude SARS-Cov-2 infection and should not be used as the sole basis for treatment or other patient management decisions. A negative result may occur with  improper specimen collection/handling, submission of specimen other than nasopharyngeal swab, presence of viral mutation(s) within the areas targeted by this assay, and inadequate number of viral copies(<138 copies/mL). A negative result must be combined with clinical observations, patient history, and epidemiological information. The expected result is Negative.  Fact Sheet for Patients:  EntrepreneurPulse.com.au  Fact Sheet  for Healthcare Providers:  IncredibleEmployment.be  This test is no t yet approved or cleared by the [place] FDA and  has been authorized for detection and/or diagnosis of SARS-CoV-2 by FDA under an Emergency Use Authorization (EUA). This EUA will remain  in effect (meaning this test can be used) for the duration of the COVID-19 declaration under Section 564(b)(1) of the Act, 21 U.S.C.section 360bbb-3(b)(1), unless the authorization is terminated  or revoked sooner.       Influenza A by PCR NEGATIVE NEGATIVE Final   Influenza B by PCR NEGATIVE NEGATIVE Final    Comment: (NOTE) The Xpert Xpress SARS-CoV-2/FLU/RSV plus assay is intended as an aid in the diagnosis of influenza from Nasopharyngeal swab specimens and should not be used as a sole basis for treatment. Nasal washings and aspirates are unacceptable for Xpert Xpress SARS-CoV-2/FLU/RSV testing.  Fact Sheet for Patients: EntrepreneurPulse.com.au  Fact Sheet for Healthcare Providers: IncredibleEmployment.be  This test is not yet approved or cleared by the [place] FDA and has been authorized for detection and/or diagnosis of SARS-CoV-2 by FDA under an Emergency Use Authorization (EUA). This EUA will remain in effect (meaning this test can be used) for the duration of the COVID-19 declaration under Section 564(b)(1) of the Act, 21 U.S.C. section 360bbb-3(b)(1), unless the authorization is terminated or revoked.  Performed at [facility], [address], [place], [place] [address]     Imaging: US Venous Img Lower Bilateral (DVT)  Result Date: 04/02/2021 CLINICAL DATA:  Bilateral lower extremity edema. EXAM: BILATERAL LOWER EXTREMITY VENOUS DOPPLER ULTRASOUND TECHNIQUE: Gray-scale sonography with graded compression, as well as color Doppler and duplex ultrasound were performed to evaluate the lower extremity deep venous systems from the level of  the common femoral vein and including the common femoral, femoral, profunda femoral, popliteal and calf veins including the posterior tibial, peroneal and gastrocnemius veins when visible. The superficial great saphenous vein was also interrogated. Spectral Doppler was utilized to evaluate flow at rest and with distal augmentation maneuvers in the common femoral, femoral and popliteal veins. COMPARISON:  Prior left lower extremity venous ultrasound on 12/26/2019 FINDINGS: RIGHT LOWER EXTREMITY Common Femoral Vein: No evidence of thrombus. Normal compressibility, respiratory phasicity and response to augmentation. Saphenofemoral Junction: No evidence of thrombus. Normal compressibility and flow on color Doppler imaging. Profunda Femoral Vein: No evidence of thrombus. Normal compressibility and flow on color Doppler imaging. Femoral Vein: No evidence of thrombus. Normal compressibility, respiratory phasicity and response to augmentation. Popliteal Vein: No evidence of thrombus. Normal compressibility, respiratory phasicity and response to augmentation. Calf Veins: No evidence of thrombus. Normal compressibility and flow on color  Doppler imaging. Superficial Great Saphenous Vein: No evidence of thrombus. Normal compressibility. Venous Reflux:  None. Other Findings: No evidence of superficial thrombophlebitis or abnormal fluid collection. LEFT LOWER EXTREMITY Common Femoral Vein: No evidence of thrombus. Normal compressibility, respiratory phasicity and response to augmentation. Saphenofemoral Junction: No evidence of thrombus. Normal compressibility and flow on color Doppler imaging. Profunda Femoral Vein: No evidence of thrombus. Normal compressibility and flow on color Doppler imaging. Femoral Vein: No evidence of thrombus. Normal compressibility, respiratory phasicity and response to augmentation. Popliteal Vein: No evidence of thrombus. Normal compressibility, respiratory phasicity and response to augmentation.  Calf Veins: No evidence of thrombus. Normal compressibility and flow on color Doppler imaging. Superficial Great Saphenous Vein: No evidence of thrombus. Normal compressibility. Venous Reflux:  None. Other Findings: No evidence of superficial thrombophlebitis or abnormal fluid collection. IMPRESSION: No evidence of deep venous thrombosis in either lower extremity. Electronically Signed   By: Aletta Edouard M.D.   On: 04/02/2021 08:32   DG Chest Port 1 View  Result Date: 04/01/2021 CLINICAL DATA:  Encounter for blood transfusion EXAM: PORTABLE CHEST 1 VIEW COMPARISON:  None. FINDINGS: Single frontal view of the chest demonstrates an unremarkable cardiac silhouette. No airspace disease, effusion, or pneumothorax. No acute bony abnormalities. IMPRESSION: 1. No acute intrathoracic process. Electronically Signed   By: Randa Ngo M.D.   On: 04/01/2021 23:57     Assessment   Valerie Case is a 71 y.o. [place] Hospital Day: 3  for postmenopausal bleeding and symptomatic anemia. Patient is s/p endometrial biopsy on 04/01/21 and received IV iron infusion and 2 units of pRBC's during hospital stay. Bleeding is now minimal and labs are stable  Plan  Hospitalist evaluated patient and deemed patient stable for discharge.   1. F/u with PCP Dr Ginette Pitman for labs within the next week 2. Continue Provera 20 mg  TID for 7 days 3.. Preop appt scheduled wit Dr Glennon Mac on 04/10/21  Dr. Glennon Mac aware of plan and agrees  Avelino Leeds CNM Certified Nurse Midwife [facility]

## 2021-04-03 NOTE — Progress Notes (Deleted)
Obstetric and Gynecology  Subjective  Valerie Case is a 71 y.o. female G36P1001 who presented on 04/01/2021 for postmenopausal bleeding and symptomatic anemia. She is currently resting in bed.Patient reports that overall she feels better, but does feel a little weak and sleep deprived.She also states that the Provera has helped drastically decrease her bleeding and she feels a lot better after the iron and blood transfusion.     Objective   Vitals:   04/03/21 0747 04/03/21 1151  BP: (!) 128/47 123/65  Pulse: 81 85  Resp: 18 20  Temp: 98.5 F (36.9 C) 98.4 F (36.9 C)  SpO2: 98%      Intake/Output Summary (Last 24 hours) at 04/03/2021 1155 Last data filed at 04/03/2021 0900 Gross per 24 hour  Intake 685.74 ml  Output 1050 ml  Net -364.26 ml    General: NAD Cardiovascular: RRR, no murmurs Pulmonary: CTAB Abdomen: Benign. Non-tender, +BS, no guarding. Extremities: No erythema or cords, no calf tenderness, +warmth with normal peripheral pulses.  Labs: Results for orders placed or performed during the hospital encounter of 04/01/21 (from the past 24 hour(s))  Glucose, capillary     Status: Abnormal   Collection Time: 04/02/21  1:29 PM  Result Value Ref Range   Glucose-Capillary 193 (H) 70 - 99 mg/dL  Glucose, capillary     Status: Abnormal   Collection Time: 04/02/21  5:49 PM  Result Value Ref Range   Glucose-Capillary 301 (H) 70 - 99 mg/dL  Glucose, capillary     Status: Abnormal   Collection Time: 04/02/21  9:56 PM  Result Value Ref Range   Glucose-Capillary 213 (H) 70 - 99 mg/dL   Comment 1 Notify RN   CBC     Status: Abnormal   Collection Time: 04/03/21  4:40 AM  Result Value Ref Range   WBC 11.8 (H) 4.0 - 10.5 K/uL   RBC 2.75 (L) 3.87 - 5.11 MIL/uL   Hemoglobin 7.7 (L) 12.0 - 15.0 g/dL   HCT 23.7 (L) 36.0 - 46.0 %   MCV 86.2 80.0 - 100.0 fL   MCH 28.0 26.0 - 34.0 pg   MCHC 32.5 30.0 - 36.0 g/dL   RDW 17.0 (H) 11.5 - 15.5 %   Platelets 264 150 - 400 K/uL    nRBC 0.0 0.0 - 0.2 %  Basic metabolic panel     Status: Abnormal   Collection Time: 04/03/21  4:40 AM  Result Value Ref Range   Sodium 135 135 - 145 mmol/L   Potassium 4.2 3.5 - 5.1 mmol/L   Chloride 107 98 - 111 mmol/L   CO2 24 22 - 32 mmol/L   Glucose, Bld 221 (H) 70 - 99 mg/dL   BUN 49 (H) 8 - 23 mg/dL   Creatinine, Ser 1.11 (H) 0.44 - 1.00 mg/dL   Calcium 8.6 (L) 8.9 - 10.3 mg/dL   GFR, Estimated 54 (L) >60 mL/min   Anion gap 4 (L) 5 - 15  Glucose, capillary     Status: Abnormal   Collection Time: 04/03/21  8:22 AM  Result Value Ref Range   Glucose-Capillary 194 (H) 70 - 99 mg/dL   Comment 1 Notify RN     Cultures: Results for orders placed or performed during the hospital encounter of 04/01/21  Resp Panel by RT-PCR (Flu A&B, Covid) Nasopharyngeal Swab     Status: None   Collection Time: 04/01/21  5:20 PM   Specimen: Nasopharyngeal Swab; Nasopharyngeal(NP) swabs in vial transport medium  Result Value Ref Range Status   SARS Coronavirus 2 by RT PCR NEGATIVE NEGATIVE Final    Comment: (NOTE) SARS-CoV-2 target nucleic acids are NOT DETECTED.  The SARS-CoV-2 RNA is generally detectable in upper respiratory specimens during the acute phase of infection. The lowest concentration of SARS-CoV-2 viral copies this assay can detect is 138 copies/mL. A negative result does not preclude SARS-Cov-2 infection and should not be used as the sole basis for treatment or other patient management decisions. A negative result may occur with  improper specimen collection/handling, submission of specimen other than nasopharyngeal swab, presence of viral mutation(s) within the areas targeted by this assay, and inadequate number of viral copies(<138 copies/mL). A negative result must be combined with clinical observations, patient history, and epidemiological information. The expected result is Negative.  Fact Sheet for Patients:  EntrepreneurPulse.com.au  Fact Sheet for  Healthcare Providers:  IncredibleEmployment.be  This test is no t yet approved or cleared by the [place] FDA and  has been authorized for detection and/or diagnosis of SARS-CoV-2 by FDA under an Emergency Use Authorization (EUA). This EUA will remain  in effect (meaning this test can be used) for the duration of the COVID-19 declaration under Section 564(b)(1) of the Act, 21 U.S.C.section 360bbb-3(b)(1), unless the authorization is terminated  or revoked sooner.       Influenza A by PCR NEGATIVE NEGATIVE Final   Influenza B by PCR NEGATIVE NEGATIVE Final    Comment: (NOTE) The Xpert Xpress SARS-CoV-2/FLU/RSV plus assay is intended as an aid in the diagnosis of influenza from Nasopharyngeal swab specimens and should not be used as a sole basis for treatment. Nasal washings and aspirates are unacceptable for Xpert Xpress SARS-CoV-2/FLU/RSV testing.  Fact Sheet for Patients: EntrepreneurPulse.com.au  Fact Sheet for Healthcare Providers: IncredibleEmployment.be  This test is not yet approved or cleared by the [place] FDA and has been authorized for detection and/or diagnosis of SARS-CoV-2 by FDA under an Emergency Use Authorization (EUA). This EUA will remain in effect (meaning this test can be used) for the duration of the COVID-19 declaration under Section 564(b)(1) of the Act, 21 U.S.C. section 360bbb-3(b)(1), unless the authorization is terminated or revoked.  Performed at [facility], [address], [place], [place] [address]     Imaging: US Venous Img Lower Bilateral (DVT)  Result Date: 04/02/2021 CLINICAL DATA:  Bilateral lower extremity edema. EXAM: BILATERAL LOWER EXTREMITY VENOUS DOPPLER ULTRASOUND TECHNIQUE: Gray-scale sonography with graded compression, as well as color Doppler and duplex ultrasound were performed to evaluate the lower extremity deep venous systems from the level of the  common femoral vein and including the common femoral, femoral, profunda femoral, popliteal and calf veins including the posterior tibial, peroneal and gastrocnemius veins when visible. The superficial great saphenous vein was also interrogated. Spectral Doppler was utilized to evaluate flow at rest and with distal augmentation maneuvers in the common femoral, femoral and popliteal veins. COMPARISON:  Prior left lower extremity venous ultrasound on 12/26/2019 FINDINGS: RIGHT LOWER EXTREMITY Common Femoral Vein: No evidence of thrombus. Normal compressibility, respiratory phasicity and response to augmentation. Saphenofemoral Junction: No evidence of thrombus. Normal compressibility and flow on color Doppler imaging. Profunda Femoral Vein: No evidence of thrombus. Normal compressibility and flow on color Doppler imaging. Femoral Vein: No evidence of thrombus. Normal compressibility, respiratory phasicity and response to augmentation. Popliteal Vein: No evidence of thrombus. Normal compressibility, respiratory phasicity and response to augmentation. Calf Veins: No evidence of thrombus. Normal compressibility and flow on color  Doppler imaging. Superficial Great Saphenous Vein: No evidence of thrombus. Normal compressibility. Venous Reflux:  None. Other Findings: No evidence of superficial thrombophlebitis or abnormal fluid collection. LEFT LOWER EXTREMITY Common Femoral Vein: No evidence of thrombus. Normal compressibility, respiratory phasicity and response to augmentation. Saphenofemoral Junction: No evidence of thrombus. Normal compressibility and flow on color Doppler imaging. Profunda Femoral Vein: No evidence of thrombus. Normal compressibility and flow on color Doppler imaging. Femoral Vein: No evidence of thrombus. Normal compressibility, respiratory phasicity and response to augmentation. Popliteal Vein: No evidence of thrombus. Normal compressibility, respiratory phasicity and response to augmentation. Calf  Veins: No evidence of thrombus. Normal compressibility and flow on color Doppler imaging. Superficial Great Saphenous Vein: No evidence of thrombus. Normal compressibility. Venous Reflux:  None. Other Findings: No evidence of superficial thrombophlebitis or abnormal fluid collection. IMPRESSION: No evidence of deep venous thrombosis in either lower extremity. Electronically Signed   By: Aletta Edouard M.D.   On: 04/02/2021 08:32   DG Chest Port 1 View  Result Date: 04/01/2021 CLINICAL DATA:  Encounter for blood transfusion EXAM: PORTABLE CHEST 1 VIEW COMPARISON:  None. FINDINGS: Single frontal view of the chest demonstrates an unremarkable cardiac silhouette. No airspace disease, effusion, or pneumothorax. No acute bony abnormalities. IMPRESSION: 1. No acute intrathoracic process. Electronically Signed   By: Randa Ngo M.D.   On: 04/01/2021 23:57     Assessment   Valerie Case is a 71 y.o. [place] [facility] Day: 3  for postmenopausal bleeding and symptomatic anemia. Patient is s/p endometrial biopsy on 04/01/21 and received IV iron infusion and 2 units of pRBC's during hospital stay. Bleeding is now minimal and labs are stable  Plan  Hospitalist evaluated patient and deemed patient stable for discharge.   1. F/u with PCP Dr Ginette Pitman for labs within the next week 2. Continue Provera 20 mg  TID for 7 days 3.. Preop appt scheduled wit Dr Glennon Mac on 04/10/21  Dr. Glennon Mac aware of plan and agrees  Avelino Leeds CNM Certified Nurse Midwife [facility]

## 2021-04-03 NOTE — Progress Notes (Signed)
°  Progress Note   Patient: Valerie Case NID:[id number] DOB: 12/07/1950 DOA: 04/01/2021     2 DOS: the patient was seen and examined on 04/03/2021     Recommedations for discharge: 1. Follow up with Dr. Ginette Pitman in 1 week 2. Dr. Ginette Pitman: Please obtain BMP in 1 week, and resume Jardiance, lisinopril, metformin as indicated       Brief hospital course: Valerie Case is a 71 y.o. F with HTN, DM, depression, obesity and sleep apnea and recent post-menopausal bleeding who was admitted for anemia and renal failure.  Patient was evaluated by Gyn yesterday for vaginal bleeding, reported soaking through a pad an hour for a week, and starting to feel fatigued.  Hgb in clinic 6.9 g/dL and Cr 2.58 from baseline 0.9 mg/dL.  Admitted for transfusion, medical treatment of vaginal bleeding, Medicine consulted for AKI and diabetes management.        Assessment and Plan * AKI (acute kidney injury) ([place])- (present on admission) Cr 2.6 on admission in setting of anemia and lisinopril/Jardiance use.  Meds held, fluids and transfusions given.  Cr 2.6 > 1.7 > 1.1 today, near baseline 0.9.    Safe for discharge from renal standpoint.  Follow up with Dr. Ginette Pitman in 1 week for BMP.  Hold lisinopril and Jardiance and metformin for now  Okay to resume HCTZ at this time, as well as other meds.        Symptomatic anemia- (present on admission) Per Ob-Gyn, with renal function normalized, standard conservative threshold for transfusion is appropriate.    Bilateral lower extremity edema- (present on admission) No clinical evidence of congestive heart failure.  Bilateral duplex ultrasound negative for DVT.  Likely venous insufficiency combined with mild renal injury.  Likely will resume with resuming Jardiance.     Depression- (present on admission)    Insulin dependent type 2 diabetes mellitus ([place])    Essential hypertension- (present on admission) BP controlled here.     Subjective:  Patient is feeling somewhat tired.  She has mild swelling of the legs.  Objective Vital signs were reviewed and unremarkable. , Sitting up in bed, interactive, no acute distress.  Attention normal, affect normal, judgment Syprine normal.  Mild lower extremity edema, no significant peripheral edema, respiratory effort normal, attention and affect normal.  Data Reviewed:  CBC notable for hemoglobin high 7s, other lines normal, basic metabolic panel notable for elevated BUN, but improving, also creatinine down to 1.1.              Author: Edwin Dada, MD 04/03/2021 9:36 AM  For on call review [email or web address].

## 2021-04-03 NOTE — Progress Notes (Signed)
°  Progress Note   Date: 04/03/2021  Patient Name: Valerie Case        MRN#: [id number]   Clarification of diagnosis:   morbid obesity severe obesity

## 2021-04-03 NOTE — Progress Notes (Signed)
Pt discharged home. Discharge instructions, prescriptions, and follow up appointments given to and reviewed with pt. Pt verbalized understanding. To be escorted by axillary.  

## 2021-04-03 NOTE — Progress Notes (Signed)
Inpatient Diabetes Program Recommendations  AACE/ADA: New Consensus Statement on Inpatient Glycemic Control  Target Ranges:  Prepandial:   less than 140 mg/dL      Peak postprandial:   less than 180 mg/dL (1-2 hours)      Critically ill patients:  140 - 180 mg/dL    Latest Reference Range & Units 04/03/21 08:22  Glucose-Capillary 70 - 99 mg/dL 194 (H)    Latest Reference Range & Units 04/02/21 08:56 04/02/21 13:29 04/02/21 17:49 04/02/21 21:56  Glucose-Capillary 70 - 99 mg/dL 196 (H) 193 (H) 301 (H) 213 (H)   Review of Glycemic Control  Diabetes history: DM2 Outpatient Diabetes medications: Jardiance 25 QD, Metformin XR 500 QD, Ozempic 1 mg Qweek, 70/30 50 units QAM, 70/30 110 units QPM Current orders for Inpatient glycemic control: 70/30 15 units BID, Novolog 0-15 units TID with meals, Novolog 0-5 units QHS  Inpatient Diabetes Program Recommendations:    Insulin: Please consider increasing 70/30 to 20 units BID.  NOTE: In reviewing chart, noted patient sees Dr. Gabriel Carina (Endocrinologist) and was last seen 02/22/21.  Thanks, Valerie Alderman, RN, MSN, CDE Diabetes Coordinator Inpatient Diabetes Program [phone] (Team Pager from 8am to 5pm)

## 2021-04-08 ENCOUNTER — Telehealth: Payer: Self-pay

## 2021-04-08 NOTE — Telephone Encounter (Signed)
Received urgent referral from Dr. Glennon Mac for poorly differentiated endometrial adenocarcinoma (FIGO III). Called and spoke with Valerie Case and appointment arranged for 04/10/21 at 0830. Provided directions to the cancer center. All questions answered.

## 2021-04-10 ENCOUNTER — Other Ambulatory Visit: Payer: Self-pay

## 2021-04-10 ENCOUNTER — Inpatient Hospital Stay: Payer: Medicare PPO | Attending: Obstetrics and Gynecology | Admitting: Obstetrics and Gynecology

## 2021-04-10 ENCOUNTER — Inpatient Hospital Stay: Payer: Medicare PPO

## 2021-04-10 VITALS — BP 146/101 | HR 98 | Temp 97.8°F | Resp 20 | Wt 285.0 lb

## 2021-04-10 DIAGNOSIS — R5383 Other fatigue: Secondary | ICD-10-CM | POA: Insufficient documentation

## 2021-04-10 DIAGNOSIS — I10 Essential (primary) hypertension: Secondary | ICD-10-CM | POA: Insufficient documentation

## 2021-04-10 DIAGNOSIS — Z79899 Other long term (current) drug therapy: Secondary | ICD-10-CM | POA: Insufficient documentation

## 2021-04-10 DIAGNOSIS — D500 Iron deficiency anemia secondary to blood loss (chronic): Secondary | ICD-10-CM

## 2021-04-10 DIAGNOSIS — Z801 Family history of malignant neoplasm of trachea, bronchus and lung: Secondary | ICD-10-CM | POA: Insufficient documentation

## 2021-04-10 DIAGNOSIS — E278 Other specified disorders of adrenal gland: Secondary | ICD-10-CM | POA: Insufficient documentation

## 2021-04-10 DIAGNOSIS — M7989 Other specified soft tissue disorders: Secondary | ICD-10-CM | POA: Insufficient documentation

## 2021-04-10 DIAGNOSIS — E119 Type 2 diabetes mellitus without complications: Secondary | ICD-10-CM

## 2021-04-10 DIAGNOSIS — D649 Anemia, unspecified: Secondary | ICD-10-CM | POA: Diagnosis not present

## 2021-04-10 DIAGNOSIS — C541 Malignant neoplasm of endometrium: Secondary | ICD-10-CM

## 2021-04-10 DIAGNOSIS — N939 Abnormal uterine and vaginal bleeding, unspecified: Secondary | ICD-10-CM | POA: Insufficient documentation

## 2021-04-10 DIAGNOSIS — Z8589 Personal history of malignant neoplasm of other organs and systems: Secondary | ICD-10-CM | POA: Insufficient documentation

## 2021-04-10 DIAGNOSIS — E538 Deficiency of other specified B group vitamins: Secondary | ICD-10-CM | POA: Insufficient documentation

## 2021-04-10 DIAGNOSIS — F32A Depression, unspecified: Secondary | ICD-10-CM

## 2021-04-10 DIAGNOSIS — L03115 Cellulitis of right lower limb: Secondary | ICD-10-CM | POA: Insufficient documentation

## 2021-04-10 DIAGNOSIS — Z809 Family history of malignant neoplasm, unspecified: Secondary | ICD-10-CM | POA: Insufficient documentation

## 2021-04-10 DIAGNOSIS — Z803 Family history of malignant neoplasm of breast: Secondary | ICD-10-CM | POA: Insufficient documentation

## 2021-04-10 DIAGNOSIS — N179 Acute kidney failure, unspecified: Secondary | ICD-10-CM

## 2021-04-10 DIAGNOSIS — N950 Postmenopausal bleeding: Secondary | ICD-10-CM | POA: Diagnosis not present

## 2021-04-10 LAB — CBC WITH DIFFERENTIAL/PLATELET
Abs Immature Granulocytes: 0.07 10*3/uL (ref 0.00–0.07)
Basophils Absolute: 0 10*3/uL (ref 0.0–0.1)
Basophils Relative: 0 %
Eosinophils Absolute: 0.4 10*3/uL (ref 0.0–0.5)
Eosinophils Relative: 4 %
HCT: 25.7 % — ABNORMAL LOW (ref 36.0–46.0)
Hemoglobin: 8.2 g/dL — ABNORMAL LOW (ref 12.0–15.0)
Immature Granulocytes: 1 %
Lymphocytes Relative: 26 %
Lymphs Abs: 2.6 10*3/uL (ref 0.7–4.0)
MCH: 28 pg (ref 26.0–34.0)
MCHC: 31.9 g/dL (ref 30.0–36.0)
MCV: 87.7 fL (ref 80.0–100.0)
Monocytes Absolute: 0.9 10*3/uL (ref 0.1–1.0)
Monocytes Relative: 9 %
Neutro Abs: 6.2 10*3/uL (ref 1.7–7.7)
Neutrophils Relative %: 60 %
Platelets: 406 10*3/uL — ABNORMAL HIGH (ref 150–400)
RBC: 2.93 MIL/uL — ABNORMAL LOW (ref 3.87–5.11)
RDW: 16.7 % — ABNORMAL HIGH (ref 11.5–15.5)
WBC: 10.2 10*3/uL (ref 4.0–10.5)
nRBC: 0 % (ref 0.0–0.2)

## 2021-04-10 LAB — PREPARE RBC (CROSSMATCH)

## 2021-04-10 NOTE — Progress Notes (Signed)
Gynecologic Oncology Consult Visit   Referring Provider: Dr. Glennon Mac  Chief Complaint: FIGO grade III endometrial adenocarcinoma  Subjective:  Valerie Case is a 71 y.o. female who is seen in consultation from Dr. Glennon Mac for poorly differentiated endometrial adenocarcinoma, figo grade III.   PMH significant for hypertension, diabetes (last HgbA1c 6.2 on 04/02/21), depression, obesity (BMI 43.5), OSA who presented for postmenopausal bleeding, anemia, and renal failure. Hemoglobin was found to be 6.9, Cr 2.58 (baseline 0.9). She had BLE edema and had ultrasound which was negative for DVT. She was started on Provera 20 mg TID x 7 days which improved her bleeding. She was admitted 04/01/21 to [facility] and received 2 units pRBCs and iron infusion.   She underwent endometrial biopsy which revealed: - poorly differentiated endometrial adenocarcinoma FIGO III  Patient had experienced hematuria in September, pcp ordered ct abdomen pelvis w contrast which did not show pelvic mass. It did show incidental 2.6 cm left adrenal mass. Patient did not have history of cancer at that time so adrenal protocol CT was deferred for ct abdomen.   Hematuria persisted and CT Hematuria workup was performed 02/12/21 which showed small uterine fibroids but no findings to explain hematuria.   Today, she reports bleeding has restarted, passing clots. Has pelvic pain which is controlled by OTCs and intermittent tramadol.  Problem List: Patient Active Problem List   Diagnosis Date Noted   Symptomatic anemia 04/01/2021   AKI (acute kidney injury) ([place]) 04/01/2021   Essential hypertension 04/01/2021   Insulin dependent type 2 diabetes mellitus ([place]) 04/01/2021   Depression 04/01/2021   Recent unintentional weight loss over several months 04/01/2021   History of asthma 04/01/2021   Thrush, oral 04/01/2021   Bilateral lower extremity edema 04/01/2021     Past Medical History: Past Medical History:  Diagnosis Date    Asthma    Depression 04/01/2021   Diabetes mellitus without complication ([place])    Essential hypertension 04/01/2021   Hx of dysplastic nevus 07/16/2010   RLQA   Hypertension    Insulin dependent type 2 diabetes mellitus ([place]) 04/01/2021    Past Surgical History: No past surgical history on file.  Past Gynecologic History:  Post menopausal - 2006 STD: denies   OB History:  OB History  Gravida Para Term Preterm AB Living  1 1 1     1   SAB IAB Ectopic Multiple Live Births          1    # Outcome Date GA Lbr Len/2nd Weight Sex Delivery Anes PTL Lv  1 Term 1980     CS-Unspec   LIV    Family History: Family History  Problem Relation Age of Onset   Breast cancer Mother 59   Cancer Mother    Cancer Father     Social History: Social History   Socioeconomic History   Marital status: Divorced    Spouse name: Not on file   Number of children: Not on file   Years of education: Not on file   Highest education level: Not on file  Occupational History   Not on file  Tobacco Use   Smoking status: Never   Smokeless tobacco: Never  Vaping Use   Vaping Use: Never used  Substance and Sexual Activity   Alcohol use: Never   Drug use: Never   Sexual activity: Not Currently  Other Topics Concern   Not on file  Social History Narrative      Social Determinants of Health  Financial Resource Strain: Not on file  Food Insecurity: Not on file  Transportation Needs: Not on file  Physical Activity: Not on file  Stress: Not on file  Social Connections: Not on file  Intimate Partner Violence: Not on file    Allergies: Allergies  Allergen Reactions   Aspirin Rash    Childhood reaction     Current Medications: Current Outpatient Medications  Medication Sig Dispense Refill   acetaminophen (TYLENOL) 650 MG CR tablet Take 1,300 mg by mouth every 8 (eight) hours as needed for pain.     albuterol (VENTOLIN HFA) 108 (90 Base) MCG/ACT inhaler Inhale 2 puffs into the lungs every  6 (six) hours as needed for wheezing or shortness of breath.     empagliflozin (JARDIANCE) 25 MG TABS tablet Take 25 mg by mouth daily.     ferrous sulfate 325 (65 FE) MG EC tablet Take 325 mg by mouth daily with breakfast.     FLUoxetine (PROZAC) 10 MG capsule Take 10 mg by mouth daily.     fluticasone (FLONASE) 50 MCG/ACT nasal spray Place 1 spray into both nostrils daily as needed for allergies or rhinitis.     hydrochlorothiazide (HYDRODIURIL) 12.5 MG tablet Take 12.5 mg by mouth daily.     ibuprofen (ADVIL) 200 MG tablet Take 400 mg by mouth every 8 (eight) hours as needed for moderate pain.     lisinopril (ZESTRIL) 40 MG tablet Take 40 mg by mouth at bedtime.     medroxyPROGESTERone (PROVERA) 10 MG tablet Take 2 tablets (20 mg total) by mouth every 8 (eight) hours for 7 days. 42 tablet 0   metFORMIN (GLUCOPHAGE-XR) 500 MG 24 hr tablet Take 1,000 mg by mouth at bedtime.     metoprolol succinate (TOPROL-XL) 25 MG 24 hr tablet Take 25 mg by mouth daily.     NOVOLIN 70/30 FLEXPEN (70-30) 100 UNIT/ML KwikPen 50-110 Units See admin instructions. 50 units in the morning, 110 units at bedtime     OZEMPIC, 1 MG/DOSE, 4 MG/3ML SOPN Inject 1 mg into the skin every Tuesday.     rosuvastatin (CRESTOR) 10 MG tablet Take 10 mg by mouth daily.     No current facility-administered medications for this visit.    Review of Systems General: negative for fevers, chills, fatigue, changes in sleep, changes in weight or appetite Skin: negative for changes in color, texture, moles or lesions Eyes: negative for changes in vision, pain, diplopia HEENT: negative for change in hearing, pain, discharge, tinnitus, vertigo, voice changes, sore throat, neck masses Breasts: negative for breast lumps Pulmonary: positive for shortness of breath and wheezing Cardiac: negative for palpitations, syncope, pain, discomfort, pressure Gastrointestinal: negative for dysphagia, nausea, vomiting, jaundice, pain, constipation,  diarrhea, hematemesis, hematochezia Genitourinary/Sexual: negative for dysuria, discharge, hesitancy, nocturia, retention, stones, infections, STD's, incontinence Ob/Gyn: positive for bleeding per hpi Musculoskeletal: negative for pain, stiffness, swelling, range of motion limitation Hematology: negative for easy bruising, bleeding Neurologic/Psych: negative for headaches, seizures, paralysis, weakness, tremor, change in gait, change in sensation, mood swings, depression, anxiety, change in memory  Objective:  Physical Examination:  BP (!) 146/101    Pulse 98    Temp 97.8 F (36.6 C)    Resp 20    Wt 285 lb (129.3 kg)    SpO2 100%    BMI 43.33 kg/m     ECOG Performance Status: 2 - Symptomatic, <50% confined to bed  GENERAL: morbidly obese. Accompanied by daughter. In wheelchair.  HEENT:  PERRL, neck  supple with midline trachea. Thyroid without masses.  NODES:  No cervical, supraclavicular, axillary, or inguinal lymphadenopathy palpated.  LUNGS:  RLL diminished. CLear otherwise HEART:  Regular rate and rhythm. Systolic murmur grade 1 auscultated ABDOMEN:  Soft, nontender.  Positive, normoactive bowel sounds.  MSK:  No focal spinal tenderness to palpation. Full range of motion bilaterally in the upper extremities. EXTREMITIES:  bilateral lower extremity peripheral edema 2-3+. Hemosiderin staining. One lesion covered by bandage on RLE.  SKIN:  Clear with no obvious rashes or skin changes.  NEURO:  Nonfocal. Well oriented.  Appropriate affect.  Pelvic: Exam chaperoned by nursing EGBUS: no lesions Cervix: no lesions, nontender, mobile Vagina: no lesions, no discharge or bleeding Uterus: normal size, nontender, mobile Adnexa: no palpable masses Rectovaginal: confirmatory  Lab Review Labs on site today: Lab Results  Component Value Date   WBC 10.2 04/10/2021   HGB 8.2 (L) 04/10/2021   HCT 25.7 (L) 04/10/2021   MCV 87.7 04/10/2021   PLT 406 (H) 04/10/2021     Chemistry       Component Value Date/Time   NA 135 04/03/2021 0440   K 4.2 04/03/2021 0440   CL 107 04/03/2021 0440   CO2 24 04/03/2021 0440   BUN 49 (H) 04/03/2021 0440   CREATININE 1.11 (H) 04/03/2021 0440      Component Value Date/Time   CALCIUM 8.6 (L) 04/03/2021 0440   ALKPHOS 61 04/02/2021 0642   AST 14 (L) 04/02/2021 0642   ALT 12 04/02/2021 0642   BILITOT 0.5 04/02/2021 0642     Albumin 3.6  Radiologic Imaging: CT scan 12/12/20 FINDINGS: Lower Chest: No acute findings.   Hepatobiliary: A few small cysts noted in the left hepatic lobe, largest measuring 3.5 cm. No hepatic masses identified. Gallbladder is unremarkable. No evidence of biliary ductal dilatation.   Pancreas:  No mass or inflammatory changes.   Spleen: Within normal limits in size and appearance.   Adrenals/Urinary Tract: 2.6 cm homogeneous left adrenal mass is seen, which has nonspecific characteristics. Tiny left renal cyst noted. No renal masses identified. No evidence of ureteral calculi or hydronephrosis. Unremarkable unopacified urinary bladder.   Stomach/Bowel: No evidence of obstruction, inflammatory process or abnormal fluid collections. Diffuse colonic diverticulosis is noted, however there is no evidence of diverticulitis.   Vascular/Lymphatic: No pathologically enlarged lymph nodes. No acute vascular findings. Aortic atherosclerotic calcification noted.   Reproductive:  No mass or other significant abnormality.   Other:  None.   Musculoskeletal:  No suspicious bone lesions identified.   IMPRESSION: No radiographic evidence of urinary tract neoplasm, calculi, or hydronephrosis.   2.6 cm nonspecific left adrenal mass. If patient has history of cancer, recommend further evaluation with adrenal protocol abdomen CT without and with contrast. If no history of cancer, consider followup by CT in 12 months. This recommendation follows ACR consensus guidelines: Management of Incidental Adrenal Masses:  A White Paper of the ACR Incidental Findings Committee. J Am Coll Radiol 2017;14:1038-1044.   Colonic diverticulosis, without radiographic evidence of diverticulitis.   Aortic Atherosclerosis (ICD10-I70.0).   CT scan 02/12/21 FINDINGS: Lower chest: No acute abnormality. Coronary artery calcifications. Small hiatal hernia.   Hepatobiliary: No solid liver abnormality is seen. Hepatomegaly, maximum coronal span 22.6 cm. Tiny gallstone in the dependent gallbladder (series 9, image 31). No gallbladder wall thickening, or biliary dilatation.   Pancreas: Unremarkable. No pancreatic ductal dilatation or surrounding inflammatory changes.   Spleen: Normal in size without significant abnormality.   Adrenals/Urinary Tract: Adrenal glands  are unremarkable. Kidneys are normal, without renal calculi, solid lesion, or hydronephrosis. No evidence of urinary tract filling defect on delayed phase imaging. Bladder is unremarkable.   Stomach/Bowel: Stomach is within normal limits. Appendix is not clearly visualized and may be surgically absent. No evidence of bowel wall thickening, distention, or inflammatory changes. Descending and sigmoid diverticulosis.   Vascular/Lymphatic: Aortic atherosclerosis. No enlarged abdominal or pelvic lymph nodes.   Reproductive: Uterine fibroids.   Other: No abdominal wall hernia or abnormality. No abdominopelvic ascites.   Musculoskeletal: No acute or significant osseous findings.   IMPRESSION: 1. No CT findings to explain hematuria. No evidence of urinary tract calculus, suspicious renal lesion, or urinary tract filling defect. No hydronephrosis. 2. Hepatomegaly. 3. Tiny gallstone in the dependent gallbladder without evidence of acute cholecystitis. 4. Descending and sigmoid diverticulosis without evidence of diverticulitis. 5. Uterine fibroids. 6. Coronary artery disease.    Assessment:  BRYONY KAMAN is a 71 y.o. female diagnosed with  poorly differentiated endometrial adenocarcinoma.  No evidence of metastatic disease on CT scan, but no imaging in past two months.   Anemia due to bleeding and s/p blood transfusion and IV iron in hospital, but she is still symptomatic.    She is morbidly obesity and has poor overall performance status and travels in a wheel chair.  Not an ideal surgical candidate.   Medical co-morbidities complicating care: HTN, AODM, asthma, depression, morbid obesity BMI 43 Plan:   Problem List Items Addressed This Visit   None Visit Diagnoses     Endometrial adenocarcinoma ([place])    -  Primary      We discussed options for management including the role of surgery, radiation and chemotherapy with the patient and her daughter.  Will order PET/CT in view of high grade cancer and potential for metastatic disease, which could affect treatment planning.  Will refer to Dr Baruch Gouty for external pelvic radiation assuming PET/CT does not show distant disease.    Medical evaluation will be performed while on radiation to determine if she is a good candidate for completion surgery with TLH/BSO.  If not felt to be a good surgical candidate at that time can complete therapy with brachytherapy.  She is anemic with Hgb 8.2 and still bleeding and symptomatic with weakness.  Will refer to Heme and give one unit of blood since still bleeding.  The patient's diagnosis, an outline of the further diagnostic and laboratory studies which will be required, the recommendation for surgery, and alternatives were discussed with her and her accompanying family members.  All questions were answered to their satisfaction.  Verlon Au, NP  I personally interviewed and examined the patient. Agreed with the above/below plan of care. I have directly contributed to assessment and plan of care of this patient and educated and discussed with patient and family.  Mellody Drown, MD  CC:  Tracie Harrier, MD [address] [place],  [place] [address] [phone]

## 2021-04-11 ENCOUNTER — Encounter: Payer: Self-pay | Admitting: Oncology

## 2021-04-11 ENCOUNTER — Inpatient Hospital Stay: Payer: Medicare PPO

## 2021-04-11 ENCOUNTER — Other Ambulatory Visit: Payer: Medicare PPO

## 2021-04-11 ENCOUNTER — Inpatient Hospital Stay (HOSPITAL_BASED_OUTPATIENT_CLINIC_OR_DEPARTMENT_OTHER): Payer: Medicare PPO | Admitting: Oncology

## 2021-04-11 ENCOUNTER — Inpatient Hospital Stay: Admission: RE | Admit: 2021-04-11 | Payer: Medicare PPO | Source: Ambulatory Visit

## 2021-04-11 VITALS — BP 133/59 | HR 83 | Temp 98.7°F | Resp 16 | Wt 287.0 lb

## 2021-04-11 DIAGNOSIS — C541 Malignant neoplasm of endometrium: Secondary | ICD-10-CM

## 2021-04-11 DIAGNOSIS — Z809 Family history of malignant neoplasm, unspecified: Secondary | ICD-10-CM

## 2021-04-11 DIAGNOSIS — D649 Anemia, unspecified: Secondary | ICD-10-CM

## 2021-04-11 DIAGNOSIS — E278 Other specified disorders of adrenal gland: Secondary | ICD-10-CM

## 2021-04-11 DIAGNOSIS — E538 Deficiency of other specified B group vitamins: Secondary | ICD-10-CM | POA: Diagnosis not present

## 2021-04-11 DIAGNOSIS — D500 Iron deficiency anemia secondary to blood loss (chronic): Secondary | ICD-10-CM

## 2021-04-11 DIAGNOSIS — R160 Hepatomegaly, not elsewhere classified: Secondary | ICD-10-CM

## 2021-04-11 DIAGNOSIS — Z801 Family history of malignant neoplasm of trachea, bronchus and lung: Secondary | ICD-10-CM

## 2021-04-11 DIAGNOSIS — L03115 Cellulitis of right lower limb: Secondary | ICD-10-CM

## 2021-04-11 DIAGNOSIS — Z803 Family history of malignant neoplasm of breast: Secondary | ICD-10-CM

## 2021-04-11 DIAGNOSIS — Z7189 Other specified counseling: Secondary | ICD-10-CM

## 2021-04-11 LAB — CBC WITH DIFFERENTIAL/PLATELET
Abs Immature Granulocytes: 0.07 10*3/uL (ref 0.00–0.07)
Basophils Absolute: 0.1 10*3/uL (ref 0.0–0.1)
Basophils Relative: 1 %
Eosinophils Absolute: 0.3 10*3/uL (ref 0.0–0.5)
Eosinophils Relative: 3 %
HCT: 27 % — ABNORMAL LOW (ref 36.0–46.0)
Hemoglobin: 8.8 g/dL — ABNORMAL LOW (ref 12.0–15.0)
Immature Granulocytes: 1 %
Lymphocytes Relative: 23 %
Lymphs Abs: 2.4 10*3/uL (ref 0.7–4.0)
MCH: 28.6 pg (ref 26.0–34.0)
MCHC: 32.6 g/dL (ref 30.0–36.0)
MCV: 87.7 fL (ref 80.0–100.0)
Monocytes Absolute: 0.9 10*3/uL (ref 0.1–1.0)
Monocytes Relative: 9 %
Neutro Abs: 6.4 10*3/uL (ref 1.7–7.7)
Neutrophils Relative %: 63 %
Platelets: 402 10*3/uL — ABNORMAL HIGH (ref 150–400)
RBC: 3.08 MIL/uL — ABNORMAL LOW (ref 3.87–5.11)
RDW: 16.3 % — ABNORMAL HIGH (ref 11.5–15.5)
WBC: 10 10*3/uL (ref 4.0–10.5)
nRBC: 0 % (ref 0.0–0.2)

## 2021-04-11 LAB — RETIC PANEL
Immature Retic Fract: 28.3 % — ABNORMAL HIGH (ref 2.3–15.9)
RBC.: 3.08 MIL/uL — ABNORMAL LOW (ref 3.87–5.11)
Retic Count, Absolute: 97.9 10*3/uL (ref 19.0–186.0)
Retic Ct Pct: 3.2 % — ABNORMAL HIGH (ref 0.4–3.1)
Reticulocyte Hemoglobin: 29.6 pg (ref >27.9)

## 2021-04-11 LAB — BILIRUBIN, FRACTIONATED(TOT/DIR/INDIR)
Bilirubin, Direct: <0.1 mg/dL (ref 0.0–0.2)
Total Bilirubin: 0.3 mg/dL (ref 0.3–1.2)

## 2021-04-11 LAB — FOLATE: Folate: 15.4 ng/mL (ref >5.9)

## 2021-04-11 LAB — LACTATE DEHYDROGENASE: LDH: 131 U/L (ref 98–192)

## 2021-04-11 LAB — TSH: TSH: 0.703 u[IU]/mL (ref 0.350–4.500)

## 2021-04-11 MED ORDER — DOXYCYCLINE HYCLATE 100 MG PO TABS: 100.0000 mg | ORAL_TABLET | Freq: Two times a day (BID) | ORAL | 0 refills | Status: DC

## 2021-04-11 MED ORDER — SODIUM CHLORIDE 0.9% IV SOLUTION
250.0000 mL | Freq: Once | INTRAVENOUS | Status: AC
  Administered 2021-04-11: 250 mL via INTRAVENOUS
  Filled 2021-04-11: qty 250

## 2021-04-11 NOTE — Progress Notes (Signed)
New pt referral by Beckey Rutter NP, pt had 1 unit of blood transfusion today at cancer center. Pt daughter is present today as well.

## 2021-04-11 NOTE — Progress Notes (Signed)
Hematology/Oncology Consult note [phone] [phone]      Patient Care Team: Tracie Harrier, MD as PCP - General (Internal Medicine)  REFERRING PROVIDER: Verlon Au, NP Dr. Fransisca Connors CHIEF COMPLAINTS/REASON FOR VISIT:  Evaluation of anemia  HISTORY OF PRESENTING ILLNESS:   Valerie Case is a  71 y.o.  female with PMH listed below was seen in consultation at the request of  Verlon Au, NP  for evaluation of anemia.   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 04/01/2021 - 04/03/2021, patient was hospitalized due to symptomatic anemia, hemoglobin 6.9, heavy postmenopausal bleeding with passing large clots.  She also presented with increased creatinine level to 2.58 compared to her baseline level of 0.9 in November 2022.  Patient received IV iron infusion and 2 units of PRBC during the hospital stay..  04/02/2021, iron panel showed iron saturation 37, ferritin 37, TIBC 333-the studies were done after patient received blood transfusion.  At discharge, hemoglobin was 7.7. 04/01/2020, endometrial biopsy showed poorly differentiated endometrial adenocarcinoma.  #Last year, patient has experienced hematuria in September 2022. 12/12/2020, CT abdomen pelvis with contrast showed no radiographic evidence of urinary tract neoplasm, calculi, hydronephrosis.  2.6 cm nonspecific left adrenal mass. Hematuria persisted and 02/12/2021 CT hematuria work-up showed small uterine fibroids, no findings to explain hematuria. Hepatomegaly, diverticulosis without evidence of diverticulitis, Coronary artery disease  04/09/2021, vitamin B12 136. 04/10/2021, patient was seen by Dr. Fransisca Connors, patient continues to have vaginal bleeding, passing clots.  CBC was checked on 04/10/2021, hemoglobin was 8.2.  MCV 87 0.7.  Platelet count 406, white count of 10.3.  Patient received 1 unit of PRBC transfusion on 04/11/2020 prior to  establish care with me on 04/11/2020 She was accompanied by her daughter.   Review of Systems  Constitutional:  Positive for fatigue. Negative for chills and fever.  HENT:   Negative for hearing loss and voice change.   Eyes:  Negative for eye problems.  Respiratory:  Negative for chest tightness and cough.   Cardiovascular:  Negative for chest pain.  Gastrointestinal:  Negative for abdominal distention, abdominal pain and blood in stool.  Endocrine: Negative for hot flashes.  Genitourinary:  Positive for vaginal bleeding. Negative for difficulty urinating and frequency.        Postmenopausal bleeding  Musculoskeletal:  Negative for arthralgias.       Patient is concerned about right lower extremity swelling, broken skin with erythematous changes  Skin:  Negative for itching and rash.  Neurological:  Negative for extremity weakness.  Hematological:  Negative for adenopathy.  Psychiatric/Behavioral:  Negative for confusion.    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    SURGICAL HISTORY: Past Surgical History:  Procedure Laterality Date   CESAREAN SECTION  01/23/1979   TONSILLECTOMY  03/17/1956    SOCIAL HISTORY: Social History   Socioeconomic History   Marital status: Divorced    Spouse name: Not on file   Number of children: Not on file   Years of education: Not on file   Highest education level: Not on file  Occupational History   Not on file  Tobacco Use   Smoking status: Never   Smokeless  tobacco: Never  Vaping Use   Vaping Use: Never used  Substance and Sexual Activity   Alcohol use: Not Currently   Drug use: Never   Sexual activity: Not Currently  Other Topics Concern   Not on file  Social History  Narrative      Social Determinants of Health   Financial Resource Strain: Not on file  Food Insecurity: Not on file  Transportation Needs: Not on file  Physical Activity: Not on file  Stress: Not on file  Social Connections: Not on file  Intimate Partner Violence: Not on file    FAMILY HISTORY: Family History  Problem Relation Age of Onset   Breast cancer Mother 23   Cancer Mother    Cancer Father    Lung cancer Father    Breast cancer Cousin        maternal side    ALLERGIES:  is allergic to aspirin.  MEDICATIONS:  Current Outpatient Medications  Medication Sig Dispense Refill   acetaminophen (TYLENOL) 650 MG CR tablet Take 1,300 mg by mouth every 8 (eight) hours as needed for pain.     albuterol (VENTOLIN HFA) 108 (90 Base) MCG/ACT inhaler Inhale 2 puffs into the lungs every 6 (six) hours as needed for wheezing or shortness of breath.     doxycycline (VIBRA-TABS) 100 MG tablet Take 1 tablet (100 mg total) by mouth 2 (two) times daily. 10 tablet 0   ferrous sulfate 325 (65 FE) MG EC tablet Take 325 mg by mouth daily with breakfast.     FLUoxetine (PROZAC) 10 MG capsule Take 10 mg by mouth daily.     fluticasone (FLONASE) 50 MCG/ACT nasal spray Place 1 spray into both nostrils daily as needed for allergies or rhinitis.     hydrochlorothiazide (HYDRODIURIL) 12.5 MG tablet Take 12.5 mg by mouth daily.     ibuprofen (ADVIL) 200 MG tablet Take 400 mg by mouth every 8 (eight) hours as needed for moderate pain.     medroxyPROGESTERone (PROVERA) 10 MG tablet Take 2 tablets (20 mg total) by mouth every 8 (eight) hours for 7 days. 42 tablet 0   metoprolol succinate (TOPROL-XL) 25 MG 24 hr tablet Take 25 mg by mouth daily.     NOVOLIN 70/30 FLEXPEN (70-30) 100 UNIT/ML KwikPen 50-110 Units See admin instructions. 50 units in the morning, 110 units at bedtime     OZEMPIC, 1 MG/DOSE, 4 MG/3ML SOPN Inject 1 mg into the skin every Tuesday.     rosuvastatin (CRESTOR) 10 MG tablet Take 10 mg  by mouth daily.     traMADol (ULTRAM) 50 MG tablet Take by mouth.     empagliflozin (JARDIANCE) 25 MG TABS tablet Take 25 mg by mouth daily. (Patient not taking: Reported on 04/11/2021)     lisinopril (ZESTRIL) 40 MG tablet Take 40 mg by mouth at bedtime. (Patient not taking: Reported on 04/11/2021)     metFORMIN (GLUCOPHAGE-XR) 500 MG 24 hr tablet Take 1,000 mg by mouth at bedtime. (Patient not taking: Reported on 04/11/2021)     No current facility-administered medications for this visit.     PHYSICAL EXAMINATION: ECOG PERFORMANCE STATUS: 1 - Symptomatic but completely ambulatory Vitals:   04/11/21 1507  BP: (!) 133/59  Pulse: 83  Resp: 16  Temp: 98.7 F (37.1 C)  SpO2: 98%   Filed Weights   04/11/21 1507  Weight: 287 lb (130.2 kg)    Physical Exam Constitutional:      General: She is not in  acute distress.    Appearance: She is obese.  HENT:     Head: Normocephalic and atraumatic.  Eyes:     General: No scleral icterus. Cardiovascular:     Rate and Rhythm: Normal rate and regular rhythm.     Heart sounds: Normal heart sounds.  Pulmonary:     Effort: Pulmonary effort is normal. No respiratory distress.     Breath sounds: No wheezing.  Abdominal:     General: Bowel sounds are normal. There is no distension.     Palpations: Abdomen is soft.  Musculoskeletal:        General: No deformity. Normal range of motion.     Cervical back: Normal range of motion and neck supple.     Right lower leg: Edema present.     Left lower leg: Edema present.     Comments: Right lower extremity erythematous area with associated edema, focal broken skin with drainage.   Skin:    General: Skin is warm and dry.     Findings: No erythema or rash.  Neurological:     Mental Status: She is alert and oriented to person, place, and time. Mental status is at baseline.     Cranial Nerves: No cranial nerve deficit.     Coordination: Coordination normal.  Psychiatric:        Mood and Affect: Mood  normal.     LABORATORY DATA:  I have reviewed the data as listed Lab Results  Component Value Date   WBC 10.0 04/11/2021   HGB 8.8 (L) 04/11/2021   HCT 27.0 (L) 04/11/2021   MCV 87.7 04/11/2021   PLT 402 (H) 04/11/2021   Recent Labs    12/07/20 1632 02/12/21 1404 04/01/21 1736 04/02/21 0642 04/03/21 0440 04/11/21 1553  NA 135  --  134* 138 135  --   K 4.2  --  5.1 4.5 4.2  --   CL 104  --  103 107 107  --   CO2 21*  --  21* 21* 24  --   GLUCOSE 133*  --  244* 215* 221*  --   BUN 25*  --  58* 55* 49*  --   CREATININE 0.90   < > 2.58* 1.76* 1.11*  --   CALCIUM 9.7  --  8.8* 8.7* 8.6*  --   GFRNONAA >60  --  20* 31* 54*  --   PROT 7.8  --  6.8 6.4*  --   --   ALBUMIN 4.2  --  3.7 3.3*  --   --   AST 22  --  15 14*  --   --   ALT 18  --  11 12  --   --   ALKPHOS 54  --  59 61  --   --   BILITOT 0.5  --  0.3 0.5  --  0.3  BILIDIR  --   --   --   --   --  <0.1  IBILI  --   --   --   --   --  NOT CALCULATED   < > = values in this interval not displayed.   Iron/TIBC/Ferritin/ %Sat    Component Value Date/Time   IRON 121 04/02/2021 0642   IRON 84 06/08/2012 1128   TIBC 332 04/02/2021 0642   TIBC 403 06/08/2012 1128   FERRITIN 37 04/02/2021 0642   FERRITIN 68 06/08/2012 1128   IRONPCTSAT 37 (H) 04/02/2021 0642   IRONPCTSAT 21  06/08/2012 1128      RADIOGRAPHIC STUDIES: I have personally reviewed the radiological images as listed and agreed with the findings in the report. US Venous Img Lower Bilateral (DVT)  Result Date: 04/02/2021 CLINICAL DATA:  Bilateral lower extremity edema. EXAM: BILATERAL LOWER EXTREMITY VENOUS DOPPLER ULTRASOUND TECHNIQUE: Gray-scale sonography with graded compression, as well as color Doppler and duplex ultrasound were performed to evaluate the lower extremity deep venous systems from the level of the common femoral vein and including the common femoral, femoral, profunda femoral, popliteal and calf veins including the posterior tibial,  peroneal and gastrocnemius veins when visible. The superficial great saphenous vein was also interrogated. Spectral Doppler was utilized to evaluate flow at rest and with distal augmentation maneuvers in the common femoral, femoral and popliteal veins. COMPARISON:  Prior left lower extremity venous ultrasound on 12/26/2019 FINDINGS: RIGHT LOWER EXTREMITY Common Femoral Vein: No evidence of thrombus. Normal compressibility, respiratory phasicity and response to augmentation. Saphenofemoral Junction: No evidence of thrombus. Normal compressibility and flow on color Doppler imaging. Profunda Femoral Vein: No evidence of thrombus. Normal compressibility and flow on color Doppler imaging. Femoral Vein: No evidence of thrombus. Normal compressibility, respiratory phasicity and response to augmentation. Popliteal Vein: No evidence of thrombus. Normal compressibility, respiratory phasicity and response to augmentation. Calf Veins: No evidence of thrombus. Normal compressibility and flow on color Doppler imaging. Superficial Great Saphenous Vein: No evidence of thrombus. Normal compressibility. Venous Reflux:  None. Other Findings: No evidence of superficial thrombophlebitis or abnormal fluid collection. LEFT LOWER EXTREMITY Common Femoral Vein: No evidence of thrombus. Normal compressibility, respiratory phasicity and response to augmentation. Saphenofemoral Junction: No evidence of thrombus. Normal compressibility and flow on color Doppler imaging. Profunda Femoral Vein: No evidence of thrombus. Normal compressibility and flow on color Doppler imaging. Femoral Vein: No evidence of thrombus. Normal compressibility, respiratory phasicity and response to augmentation. Popliteal Vein: No evidence of thrombus. Normal compressibility, respiratory phasicity and response to augmentation. Calf Veins: No evidence of thrombus. Normal compressibility and flow on color Doppler imaging. Superficial Great Saphenous Vein: No evidence of  thrombus. Normal compressibility. Venous Reflux:  None. Other Findings: No evidence of superficial thrombophlebitis or abnormal fluid collection. IMPRESSION: No evidence of deep venous thrombosis in either lower extremity. Electronically Signed   By: Aletta Edouard M.D.   On: 04/02/2021 08:32   DG Chest Port 1 View  Result Date: 04/01/2021 CLINICAL DATA:  Encounter for blood transfusion EXAM: PORTABLE CHEST 1 VIEW COMPARISON:  None. FINDINGS: Single frontal view of the chest demonstrates an unremarkable cardiac silhouette. No airspace disease, effusion, or pneumothorax. No acute bony abnormalities. IMPRESSION: 1. No acute intrathoracic process. Electronically Signed   By: Randa Ngo M.D.   On: 04/01/2021 23:57      ASSESSMENT & PLAN:  1. Normocytic anemia   2. B12 deficiency   3. Endometrial adenocarcinoma ([place])   4. Adrenal mass ([place])   5. Cellulitis of right lower extremity    #Normocytic anemia, could be secondary to acute blood loss Patient has had multiple units of PRBC transfusion including 1 blood transfusion prior to today's visit.  Blood work-up results may be compounded by recent blood transfusion.  Interpretation with caution. Check CBC, LDH, haptoglobin, TSH, folate, fractionated bilirubin, multiple myeloma panel, light chain ratio, reticulocyte panel.             #Vitamin B12 deficiency, check intrinsic factor antibody, antiparietal antibody.  Recommend patient to start parenteral vitamin B12 injection daily followed by weekly followed by  monthly.  Due to daughter's working schedule, plan was adjusted to vitamin B12 injection weekly followed by monthly.  Recommend patient to start oral vitamin B12 1000 MCG daily return to the start of weekly B12 injections.      # Right lower extremity cellulitis, will treat with course of doxycycline 18m BID for 5 days.   #Endometrial carcinoma, FIGO stage III.                                                                                                                                                   Patient has been seen by gynecology oncology.  Plan PET scan for staging.  Further plan depending on staging results. #Adrenal mass, pending above work-up. Hepatomegaly, she has history of fatty liver disease.   Orders Placed This Encounter  Procedures   Technologist smear review    Standing Status:   Future    Number of Occurrences:   1    Standing Expiration Date:   04/11/2022   CBC with Differential/Platelet    Standing Status:   Future    Number of Occurrences:   1    Standing Expiration Date:   04/11/2022   Retic Panel    Standing Status:   Future    Number of Occurrences:   1    Standing Expiration Date:   04/11/2022   Multiple Myeloma Panel (SPEP&IFE w/QIG)    Standing Status:   Future    Number of Occurrences:   1    Standing Expiration Date:   04/11/2022   Kappa/lambda light chains    Standing Status:   Future    Number of Occurrences:   1    Standing Expiration Date:   04/11/2022   Lactate dehydrogenase    Standing Status:   Future    Number of Occurrences:   1    Standing Expiration Date:   04/11/2022   Anti-parietal antibody    Standing Status:   Future    Number of Occurrences:   1    Standing Expiration Date:   04/11/2022   Intrinsic Factor Antibodies    Standing Status:   Future    Number of Occurrences:   1    Standing Expiration Date:   04/11/2022   Bilirubin, fractionated(tot/dir/indir)    Standing Status:   Future    Number of Occurrences:   1    Standing Expiration Date:   04/11/2022   Folate    Standing Status:   Future    Number of Occurrences:   1    Standing Expiration Date:   04/11/2022   TSH    Standing Status:   Future    Number of Occurrences:   1    Standing Expiration Date:   04/11/2022   Haptoglobin    Standing Status:   Future  Number of Occurrences:   1    Standing Expiration Date:   04/11/2022    All questions were answered. The patient knows to call the clinic with any  problems questions or concerns.  cc Verlon Au, NP    Return of visit: To be determined.  Thank you for this kind referral and the opportunity to participate in the care of this patient. A copy of today's note is routed to referring provider   Earlie Server, MD, [name] [facility] Hematology Oncology 04/11/2021

## 2021-04-12 LAB — BPAM RBC
Blood Product Expiration Date: 202302142359
ISSUE DATE / TIME: 202301260905
Unit Type and Rh: 6200

## 2021-04-12 LAB — TYPE AND SCREEN
ABO/RH(D): A POS
Antibody Screen: NEGATIVE
Unit division: 0

## 2021-04-12 LAB — KAPPA/LAMBDA LIGHT CHAINS
Kappa free light chain: 28.1 mg/L — ABNORMAL HIGH (ref 3.3–19.4)
Kappa, lambda light chain ratio: 1.47 (ref 0.26–1.65)
Lambda free light chains: 19.1 mg/L (ref 5.7–26.3)

## 2021-04-12 LAB — TECHNOLOGIST SMEAR REVIEW
Plt Morphology: NORMAL
RBC MORPHOLOGY: NORMAL
WBC MORPHOLOGY: NORMAL

## 2021-04-12 LAB — INTRINSIC FACTOR ANTIBODIES: Intrinsic Factor: 1 AU/mL (ref 0.0–1.1)

## 2021-04-12 LAB — ANTI-PARIETAL ANTIBODY: Parietal Cell Antibody-IgG: 1.5 Units (ref 0.0–20.0)

## 2021-04-12 LAB — HAPTOGLOBIN: Haptoglobin: 460 mg/dL — ABNORMAL HIGH (ref 37–355)

## 2021-04-14 DIAGNOSIS — C541 Malignant neoplasm of endometrium: Secondary | ICD-10-CM | POA: Insufficient documentation

## 2021-04-14 DIAGNOSIS — L03115 Cellulitis of right lower limb: Secondary | ICD-10-CM | POA: Insufficient documentation

## 2021-04-14 DIAGNOSIS — R160 Hepatomegaly, not elsewhere classified: Secondary | ICD-10-CM | POA: Insufficient documentation

## 2021-04-14 DIAGNOSIS — Z7189 Other specified counseling: Secondary | ICD-10-CM | POA: Insufficient documentation

## 2021-04-14 DIAGNOSIS — E278 Other specified disorders of adrenal gland: Secondary | ICD-10-CM | POA: Insufficient documentation

## 2021-04-15 ENCOUNTER — Ambulatory Visit
Admission: RE | Admit: 2021-04-15 | Discharge: 2021-04-15 | Disposition: A | Discharge status: Home / Self Care | Payer: Medicare PPO | Source: Ambulatory Visit | Attending: Nurse Practitioner | Admitting: Nurse Practitioner

## 2021-04-15 ENCOUNTER — Other Ambulatory Visit: Payer: Self-pay

## 2021-04-15 ENCOUNTER — Inpatient Hospital Stay: Payer: Medicare PPO

## 2021-04-15 DIAGNOSIS — E119 Type 2 diabetes mellitus without complications: Secondary | ICD-10-CM | POA: Diagnosis not present

## 2021-04-15 DIAGNOSIS — Z794 Long term (current) use of insulin: Secondary | ICD-10-CM | POA: Insufficient documentation

## 2021-04-15 DIAGNOSIS — C541 Malignant neoplasm of endometrium: Secondary | ICD-10-CM | POA: Diagnosis present

## 2021-04-15 LAB — MULTIPLE MYELOMA PANEL, SERUM
Albumin SerPl Elph-Mcnc: 3 g/dL (ref 2.9–4.4)
Albumin/Glob SerPl: 1 (ref 0.7–1.7)
Alpha 1: 0.3 g/dL (ref 0.0–0.4)
Alpha2 Glob SerPl Elph-Mcnc: 1.3 g/dL — ABNORMAL HIGH (ref 0.4–1.0)
B-Globulin SerPl Elph-Mcnc: 1 g/dL (ref 0.7–1.3)
Gamma Glob SerPl Elph-Mcnc: 0.6 g/dL (ref 0.4–1.8)
Globulin, Total: 3.2 g/dL (ref 2.2–3.9)
IgA: 134 mg/dL (ref 87–352)
IgG (Immunoglobin G), Serum: 615 mg/dL (ref 586–1602)
IgM (Immunoglobulin M), Srm: 18 mg/dL — ABNORMAL LOW (ref 26–217)
Total Protein ELP: 6.2 g/dL (ref 6.0–8.5)

## 2021-04-15 LAB — GLUCOSE, CAPILLARY: Glucose-Capillary: 153 mg/dL — ABNORMAL HIGH (ref 70–99)

## 2021-04-15 MED ORDER — FLUDEOXYGLUCOSE F - 18 (FDG) INJECTION
14.8000 | Freq: Once | INTRAVENOUS | Status: AC
  Administered 2021-04-15: 15.62 via INTRAVENOUS

## 2021-04-16 ENCOUNTER — Telehealth: Payer: Self-pay

## 2021-04-16 ENCOUNTER — Other Ambulatory Visit: Payer: Self-pay | Admitting: *Deleted

## 2021-04-16 DIAGNOSIS — D649 Anemia, unspecified: Secondary | ICD-10-CM

## 2021-04-16 DIAGNOSIS — C541 Malignant neoplasm of endometrium: Secondary | ICD-10-CM

## 2021-04-16 DIAGNOSIS — D500 Iron deficiency anemia secondary to blood loss (chronic): Secondary | ICD-10-CM

## 2021-04-16 NOTE — Telephone Encounter (Signed)
Pt informed of lab results and add lab for next week. Verbalized understanding.

## 2021-04-16 NOTE — Telephone Encounter (Signed)
-----   Message from Earlie Server, MD sent at 04/16/2021  8:35 AM EST ----- Let her know that her anemia work up results are ok. Blood counts are trending up,  Keep current b12 injection appointment, please arrange her to get lab cbc, hold tube iron, tibc ferritin next week to monitor counts.  Follow up plan TBD.

## 2021-04-17 ENCOUNTER — Other Ambulatory Visit: Payer: Self-pay

## 2021-04-17 ENCOUNTER — Inpatient Hospital Stay: Payer: Medicare PPO

## 2021-04-17 ENCOUNTER — Encounter: Payer: Self-pay | Admitting: Radiation Oncology

## 2021-04-17 ENCOUNTER — Other Ambulatory Visit: Payer: Self-pay | Admitting: Oncology

## 2021-04-17 ENCOUNTER — Ambulatory Visit
Admission: RE | Admit: 2021-04-17 | Discharge: 2021-04-17 | Disposition: A | Discharge status: Home / Self Care | Payer: Medicare PPO | Source: Ambulatory Visit | Attending: Radiation Oncology | Admitting: Radiation Oncology

## 2021-04-17 VITALS — BP 171/76 | HR 99 | Temp 98.0°F | Wt 284.0 lb

## 2021-04-17 DIAGNOSIS — C541 Malignant neoplasm of endometrium: Secondary | ICD-10-CM | POA: Diagnosis present

## 2021-04-17 DIAGNOSIS — D649 Anemia, unspecified: Secondary | ICD-10-CM | POA: Insufficient documentation

## 2021-04-17 DIAGNOSIS — Z803 Family history of malignant neoplasm of breast: Secondary | ICD-10-CM | POA: Diagnosis not present

## 2021-04-17 DIAGNOSIS — Z7984 Long term (current) use of oral hypoglycemic drugs: Secondary | ICD-10-CM | POA: Insufficient documentation

## 2021-04-17 DIAGNOSIS — D509 Iron deficiency anemia, unspecified: Secondary | ICD-10-CM | POA: Insufficient documentation

## 2021-04-17 DIAGNOSIS — E119 Type 2 diabetes mellitus without complications: Secondary | ICD-10-CM | POA: Insufficient documentation

## 2021-04-17 DIAGNOSIS — Z79899 Other long term (current) drug therapy: Secondary | ICD-10-CM | POA: Diagnosis not present

## 2021-04-17 DIAGNOSIS — E538 Deficiency of other specified B group vitamins: Secondary | ICD-10-CM | POA: Insufficient documentation

## 2021-04-17 DIAGNOSIS — Z510 Encounter for antineoplastic radiation therapy: Secondary | ICD-10-CM | POA: Diagnosis present

## 2021-04-17 DIAGNOSIS — Z7982 Long term (current) use of aspirin: Secondary | ICD-10-CM | POA: Diagnosis not present

## 2021-04-17 DIAGNOSIS — R599 Enlarged lymph nodes, unspecified: Secondary | ICD-10-CM | POA: Insufficient documentation

## 2021-04-17 DIAGNOSIS — E279 Disorder of adrenal gland, unspecified: Secondary | ICD-10-CM | POA: Diagnosis not present

## 2021-04-17 DIAGNOSIS — I10 Essential (primary) hypertension: Secondary | ICD-10-CM | POA: Insufficient documentation

## 2021-04-17 MED ORDER — CYANOCOBALAMIN 1000 MCG/ML IJ SOLN
1000.0000 ug | Freq: Once | INTRAMUSCULAR | Status: AC
  Administered 2021-04-17: 1000 ug via INTRAMUSCULAR
  Filled 2021-04-17: qty 1

## 2021-04-17 NOTE — Consult Note (Signed)
NEW PATIENT EVALUATION  Name: Valerie Case  MRN: [id number]  Date:   04/17/2021     DOB: 1950/10/05   This 71 y.o. 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  REFERRING PHYSICIAN: Tracie Harrier, MD  CHIEF COMPLAINT:  Chief Complaint  Patient presents with   Cancer    Initial consult endometrial     DIAGNOSIS: The encounter diagnosis was Endometrial adenocarcinoma ([place]).   PREVIOUS INVESTIGATIONS:  PET CT scan reviewed Pathology report reviewed Clinical notes reviewed  HPI: Patient is a 71 year old female who presented with profound anemia with a hemoglobin of 6.9 secondary to heavy postmenopausal bleeding.  She received packed red cells.  She had an endometrial biopsy showing poorly differentiated endometrial adenocarcinoma.  She had multiple CT scan showing no evidence of significant disease.  She had a 2.6 cm nonspecific left adrenal mass.  She underwent biopsy by Dr. Glennon Mac with diagnosis of endometrial adenocarcinoma high-grade FIGO grade 3.  Based on the patient's comorbidities and obesity not thought to be a surgical candidate.  PET CT scan was ordered showing a large hypermetabolic endometrial mass consistent with known endometrial carcinoma with direct invasion of the right adnexa.  Right pelvic sidewall also had hypermetabolic adenopathy.  There was an enlarged hypermetabolic left adrenal gland could reflect a lipid poor hyperfunctioning adenoma but metastasis was also considered.  Patient's had multiple transfusions and is now seen for radiation oncology opinion.  Her bleeding has subsided somewhat she is having no significant lower urinary tract symptoms.  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    PAST SURGICAL HISTORY:  Past Surgical History:  Procedure Laterality Date   CESAREAN SECTION  01/23/1979   TONSILLECTOMY  03/17/1956    FAMILY HISTORY: family history includes Breast cancer in her cousin; Breast cancer (age of onset: 68) in her mother; Cancer in her father and mother; Lung cancer in her father.  SOCIAL HISTORY:  reports that she has never smoked. She has never used smokeless tobacco. She reports that she does not currently use alcohol. She reports that she does not use drugs.  ALLERGIES: Aspirin  MEDICATIONS:  Current Outpatient Medications  Medication Sig Dispense Refill   acetaminophen (TYLENOL) 650 MG CR tablet Take 1,300 mg by mouth every 8 (eight) hours as needed for pain.     albuterol (VENTOLIN HFA) 108 (90 Base) MCG/ACT inhaler Inhale 2 puffs into the lungs every 6 (six) hours as needed for wheezing or shortness of breath.     doxycycline (VIBRA-TABS) 100 MG tablet Take 1 tablet (100 mg total) by mouth 2 (two) times daily. 10 tablet 0   empagliflozin (JARDIANCE) 25 MG TABS tablet Take 25 mg by mouth daily. (Patient not taking: Reported on 04/11/2021)     ferrous sulfate 325 (65 FE) MG EC tablet Take 325 mg by mouth daily with breakfast.     FLUoxetine (PROZAC) 10 MG capsule Take 10 mg by mouth daily.     fluticasone (FLONASE) 50 MCG/ACT nasal spray Place 1 spray into both nostrils daily as needed for allergies or rhinitis.     hydrochlorothiazide (HYDRODIURIL) 12.5 MG tablet Take 12.5 mg by mouth daily.  ibuprofen (ADVIL) 200 MG tablet Take 400 mg by mouth every 8 (eight) hours as needed for moderate pain.     lisinopril (ZESTRIL) 40 MG tablet Take 40 mg by mouth at bedtime. (Patient not taking: Reported on 04/11/2021)     medroxyPROGESTERone (PROVERA) 10 MG tablet Take 2 tablets (20 mg  total) by mouth every 8 (eight) hours for 7 days. 42 tablet 0   metFORMIN (GLUCOPHAGE-XR) 500 MG 24 hr tablet Take 1,000 mg by mouth at bedtime. (Patient not taking: Reported on 04/11/2021)     metoprolol succinate (TOPROL-XL) 25 MG 24 hr tablet Take 25 mg by mouth daily.     NOVOLIN 70/30 FLEXPEN (70-30) 100 UNIT/ML KwikPen 50-110 Units See admin instructions. 50 units in the morning, 110 units at bedtime     OZEMPIC, 1 MG/DOSE, 4 MG/3ML SOPN Inject 1 mg into the skin every Tuesday.     rosuvastatin (CRESTOR) 10 MG tablet Take 10 mg by mouth daily.     traMADol (ULTRAM) 50 MG tablet Take by mouth.     No current facility-administered medications for this encounter.    ECOG PERFORMANCE STATUS:  2 - Symptomatic, <50% confined to bed  REVIEW OF SYSTEMS: Patient denies any weight loss, fatigue, weakness, fever, chills or night sweats. Patient denies any loss of vision, blurred vision. Patient denies any ringing  of the ears or hearing loss. No irregular heartbeat. Patient denies heart murmur or history of fainting. Patient denies any chest pain or pain radiating to her upper extremities. Patient denies any shortness of breath, difficulty breathing at night, cough or hemoptysis. Patient denies any swelling in the lower legs. Patient denies any nausea vomiting, vomiting of blood, or coffee ground material in the vomitus. Patient denies any stomach pain. Patient states has had normal bowel movements no significant constipation or diarrhea. Patient denies any dysuria, hematuria or significant nocturia. Patient denies any problems walking, swelling in the joints or loss of balance. Patient denies any skin changes, loss of hair or loss of weight. Patient denies any excessive worrying or anxiety or significant depression. Patient denies any problems with insomnia. Patient denies excessive thirst, polyuria, polydipsia. Patient denies any swollen glands, patient denies easy bruising or easy bleeding. Patient denies  any recent infections, allergies or URI. Patient "s visual fields have not changed significantly in recent time.   PHYSICAL EXAM: BP (!) 171/76    Pulse 99    Temp 98 F (36.7 C)    Wt 284 lb (128.8 kg)    BMI 43.18 kg/m  Obese wheelchair-bound female in NAD.  Well-developed well-nourished patient in NAD. HEENT reveals PERLA, EOMI, discs not visualized.  Oral cavity is clear. No oral mucosal lesions are identified. Neck is clear without evidence of cervical or supraclavicular adenopathy. Lungs are clear to A&P. Cardiac examination is essentially unremarkable with regular rate and rhythm without murmur rub or thrill. Abdomen is benign with no organomegaly or masses noted. Motor sensory and DTR levels are equal and symmetric in the upper and lower extremities. Cranial nerves II through XII are grossly intact. Proprioception is intact. No peripheral adenopathy or edema is identified. No motor or sensory levels are noted. Crude visual fields are within normal range.  LABORATORY DATA: Pathology report reviewed    RADIOLOGY RESULTS: PET CT scan reviewed compatible with above-stated findings   IMPRESSION: Locally advanced FIGO grade 3 high-grade endometrial carcinoma with possible metastatic disease to the left adrenal gland in 71 year old female  PLAN: At this time will  discuss further radiologic work-up of her adrenal gland.  I believe we should start whole pelvic radiation to prevent further bleeding and necessity for continued transfusions.  I would plan on delivering 45 Gray over 5 weeks using external beam radiation therapy.  Risks and benefits of treatment occluding increased lower urinary tract symptoms diarrhea fatigue alteration of blood counts skin reaction all were reviewed with the patient and her daughter.  I have discussed the case with Dr. Fransisca Connors who agrees with proceeding with radiation therapy at this time.  Certainly if this proves to be metastatic disease to her left adrenal can always  use SBRT to that area in the future.  Patient is also being evaluated by medical oncology for possibility of systemic treatment.  Daughter and patient both copy my treatment plan well I personally set up and ordered CT simulation for tomorrow and will start her treatments first thing next week.  I would like to take this opportunity to thank you for allowing me to participate in the care of your patient.Noreene Filbert, MD

## 2021-04-18 ENCOUNTER — Ambulatory Visit
Admission: RE | Admit: 2021-04-18 | Discharge: 2021-04-18 | Disposition: A | Discharge status: Home / Self Care | Payer: Medicare PPO | Source: Ambulatory Visit | Attending: Radiation Oncology | Admitting: Radiation Oncology

## 2021-04-18 ENCOUNTER — Ambulatory Visit: Admit: 2021-04-18 | Payer: Medicare PPO | Admitting: Obstetrics and Gynecology

## 2021-04-18 ENCOUNTER — Other Ambulatory Visit: Payer: Self-pay

## 2021-04-18 ENCOUNTER — Telehealth: Payer: Self-pay | Admitting: Nurse Practitioner

## 2021-04-18 DIAGNOSIS — Z510 Encounter for antineoplastic radiation therapy: Secondary | ICD-10-CM | POA: Insufficient documentation

## 2021-04-18 DIAGNOSIS — C541 Malignant neoplasm of endometrium: Secondary | ICD-10-CM

## 2021-04-18 SURGERY — DILATATION AND CURETTAGE /HYSTEROSCOPY
Anesthesia: Choice

## 2021-04-18 NOTE — Telephone Encounter (Signed)
PET results were reviewed with Dr. Fransisca Connors. I reviewed with patient. She has seen Dr. Baruch Gouty for planning for upfront radiation. We will plan to see her approximately 1 month after she completes radiation with scans prior for consideration of possible surgery.   She does not have follow up with hematology currently. I've asked Dr Collie Siad nursing team to reach out to patient to schedule follow up.

## 2021-04-22 ENCOUNTER — Inpatient Hospital Stay: Payer: Medicare PPO

## 2021-04-22 ENCOUNTER — Other Ambulatory Visit: Payer: Self-pay

## 2021-04-22 DIAGNOSIS — C541 Malignant neoplasm of endometrium: Secondary | ICD-10-CM

## 2021-04-22 DIAGNOSIS — E538 Deficiency of other specified B group vitamins: Secondary | ICD-10-CM

## 2021-04-22 DIAGNOSIS — D500 Iron deficiency anemia secondary to blood loss (chronic): Secondary | ICD-10-CM

## 2021-04-22 DIAGNOSIS — D649 Anemia, unspecified: Secondary | ICD-10-CM

## 2021-04-22 DIAGNOSIS — Z510 Encounter for antineoplastic radiation therapy: Secondary | ICD-10-CM | POA: Diagnosis not present

## 2021-04-22 LAB — CBC WITH DIFFERENTIAL/PLATELET
Abs Immature Granulocytes: 0.03 10*3/uL (ref 0.00–0.07)
Basophils Absolute: 0 10*3/uL (ref 0.0–0.1)
Basophils Relative: 1 %
Eosinophils Absolute: 0.2 10*3/uL (ref 0.0–0.5)
Eosinophils Relative: 3 %
HCT: 29.6 % — ABNORMAL LOW (ref 36.0–46.0)
Hemoglobin: 9.5 g/dL — ABNORMAL LOW (ref 12.0–15.0)
Immature Granulocytes: 0 %
Lymphocytes Relative: 22 %
Lymphs Abs: 1.5 10*3/uL (ref 0.7–4.0)
MCH: 28.3 pg (ref 26.0–34.0)
MCHC: 32.1 g/dL (ref 30.0–36.0)
MCV: 88.1 fL (ref 80.0–100.0)
Monocytes Absolute: 0.6 10*3/uL (ref 0.1–1.0)
Monocytes Relative: 8 %
Neutro Abs: 4.6 10*3/uL (ref 1.7–7.7)
Neutrophils Relative %: 66 %
Platelets: 345 10*3/uL (ref 150–400)
RBC: 3.36 MIL/uL — ABNORMAL LOW (ref 3.87–5.11)
RDW: 16.2 % — ABNORMAL HIGH (ref 11.5–15.5)
WBC: 7 10*3/uL (ref 4.0–10.5)
nRBC: 0 % (ref 0.0–0.2)

## 2021-04-22 LAB — SAMPLE TO BLOOD BANK

## 2021-04-22 LAB — FERRITIN: Ferritin: 42 ng/mL (ref 11–307)

## 2021-04-22 LAB — IRON AND TIBC
Iron: 49 ug/dL (ref 28–170)
Saturation Ratios: 16 % (ref 10.4–31.8)
TIBC: 301 ug/dL (ref 250–450)
UIBC: 252 ug/dL

## 2021-04-22 MED ORDER — CYANOCOBALAMIN 1000 MCG/ML IJ SOLN: 1000.0000 ug | Freq: Once | INTRAMUSCULAR | Status: DC

## 2021-04-22 MED ORDER — CYANOCOBALAMIN 1000 MCG/ML IJ SOLN
1000.0000 ug | Freq: Once | INTRAMUSCULAR | Status: AC
  Administered 2021-04-22: 1000 ug via INTRAMUSCULAR
  Filled 2021-04-22: qty 1

## 2021-04-23 ENCOUNTER — Telehealth: Payer: Self-pay | Admitting: *Deleted

## 2021-04-23 NOTE — Telephone Encounter (Signed)
Patient called reporting that she is having "heavy vaginal bleeding and passing clots". She is asking if we could "order Provera or something for the bleeding". Please advise

## 2021-04-23 NOTE — Telephone Encounter (Signed)
Per Alease Medina, NP "looks like she starts radiation tomorrow which is for the bleeding that is associated with her cancer. Dr Tasia Catchings is managing her anemia and iron deficiency which is secondary to the vaginal bleeding. "  Per Dr Tasia Catchings "Hb is better. I will recommend her to take iron tabs"  I called and  spoke with patient and advised that she take Iron tabs 325 mg She states she has these on hand and will take them 1 daily

## 2021-04-24 ENCOUNTER — Ambulatory Visit
Admission: RE | Admit: 2021-04-24 | Discharge: 2021-04-24 | Disposition: A | Discharge status: Home / Self Care | Payer: Medicare PPO | Source: Ambulatory Visit | Attending: Radiation Oncology | Admitting: Radiation Oncology

## 2021-04-24 ENCOUNTER — Other Ambulatory Visit: Payer: Self-pay

## 2021-04-24 ENCOUNTER — Other Ambulatory Visit: Payer: Medicare PPO

## 2021-04-24 DIAGNOSIS — Z510 Encounter for antineoplastic radiation therapy: Secondary | ICD-10-CM | POA: Diagnosis not present

## 2021-04-24 DIAGNOSIS — C541 Malignant neoplasm of endometrium: Secondary | ICD-10-CM

## 2021-04-24 DIAGNOSIS — D500 Iron deficiency anemia secondary to blood loss (chronic): Secondary | ICD-10-CM

## 2021-04-24 NOTE — Telephone Encounter (Signed)
Aby please contact pt to schedule a lab next week. Thanks

## 2021-04-24 NOTE — Progress Notes (Signed)
Tumor Board Documentation  [name] was presented by myself at our Tumor Board on 04/24/2021, which included representatives from medical oncology, radiation oncology, surgical oncology, pathology, genetics, palliative care.  Valerie Case currently presents as a new patient, for Valerie Case (Patient is 71 year old female who presented to ER for post menopausal bleeding. She underwent ECC.) with history of the following treatments:  . She is currently receiving palliative radiation given her ongoing bleeding, anemia.  Additionally, we reviewed previous medical and familial history, history of present illness, and recent lab results along with all available histopathologic and imaging studies. The tumor board considered available treatment options and made the following recommendations: Will see her back after radiation with imaging to re-evaluate possible surgical intervention.      The following procedures/referrals were also placed: No orders of the defined types were placed in this encounter.   Clinical Trial Status:     Staging used:    National site-specific guidelines   were discussed with respect to the case.  Tumor board is a meeting of clinicians from various specialty areas who evaluate and discuss patients for whom a multidisciplinary approach is being considered. Final determinations in the plan of care are those of the provider(s). The responsibility for follow up of recommendations given during tumor board is that of the provider.   Todays extended care, comprehensive team conference, [name] was not present for the discussion and was not examined.

## 2021-04-25 ENCOUNTER — Ambulatory Visit
Admission: RE | Admit: 2021-04-25 | Discharge: 2021-04-25 | Disposition: A | Discharge status: Home / Self Care | Payer: Medicare PPO | Source: Ambulatory Visit | Attending: Radiation Oncology | Admitting: Radiation Oncology

## 2021-04-25 DIAGNOSIS — Z510 Encounter for antineoplastic radiation therapy: Secondary | ICD-10-CM | POA: Diagnosis not present

## 2021-04-26 ENCOUNTER — Ambulatory Visit
Admission: RE | Admit: 2021-04-26 | Discharge: 2021-04-26 | Disposition: A | Discharge status: Home / Self Care | Payer: Medicare PPO | Source: Ambulatory Visit | Attending: Radiation Oncology | Admitting: Radiation Oncology

## 2021-04-26 DIAGNOSIS — Z510 Encounter for antineoplastic radiation therapy: Secondary | ICD-10-CM | POA: Diagnosis not present

## 2021-04-29 ENCOUNTER — Other Ambulatory Visit: Payer: Self-pay

## 2021-04-29 ENCOUNTER — Inpatient Hospital Stay: Payer: Medicare PPO

## 2021-04-29 ENCOUNTER — Ambulatory Visit
Admission: RE | Admit: 2021-04-29 | Discharge: 2021-04-29 | Disposition: A | Discharge status: Home / Self Care | Payer: Medicare PPO | Source: Ambulatory Visit | Attending: Radiation Oncology | Admitting: Radiation Oncology

## 2021-04-29 ENCOUNTER — Inpatient Hospital Stay: Payer: Medicare PPO | Admitting: Oncology

## 2021-04-29 DIAGNOSIS — D500 Iron deficiency anemia secondary to blood loss (chronic): Secondary | ICD-10-CM

## 2021-04-29 DIAGNOSIS — Z510 Encounter for antineoplastic radiation therapy: Secondary | ICD-10-CM | POA: Diagnosis not present

## 2021-04-29 DIAGNOSIS — E538 Deficiency of other specified B group vitamins: Secondary | ICD-10-CM

## 2021-04-29 LAB — CBC WITH DIFFERENTIAL/PLATELET
Abs Immature Granulocytes: 0.06 10*3/uL (ref 0.00–0.07)
Basophils Absolute: 0 10*3/uL (ref 0.0–0.1)
Basophils Relative: 1 %
Eosinophils Absolute: 0.2 10*3/uL (ref 0.0–0.5)
Eosinophils Relative: 3 %
HCT: 27.9 % — ABNORMAL LOW (ref 36.0–46.0)
Hemoglobin: 8.7 g/dL — ABNORMAL LOW (ref 12.0–15.0)
Immature Granulocytes: 1 %
Lymphocytes Relative: 18 %
Lymphs Abs: 1.1 10*3/uL (ref 0.7–4.0)
MCH: 27.9 pg (ref 26.0–34.0)
MCHC: 31.2 g/dL (ref 30.0–36.0)
MCV: 89.4 fL (ref 80.0–100.0)
Monocytes Absolute: 0.4 10*3/uL (ref 0.1–1.0)
Monocytes Relative: 6 %
Neutro Abs: 4.7 10*3/uL (ref 1.7–7.7)
Neutrophils Relative %: 71 %
Platelets: 318 10*3/uL (ref 150–400)
RBC: 3.12 MIL/uL — ABNORMAL LOW (ref 3.87–5.11)
RDW: 16.1 % — ABNORMAL HIGH (ref 11.5–15.5)
WBC: 6.5 10*3/uL (ref 4.0–10.5)
nRBC: 0 % (ref 0.0–0.2)

## 2021-04-29 LAB — RETIC PANEL
Immature Retic Fract: 28.9 % — ABNORMAL HIGH (ref 2.3–15.9)
RBC.: 3.06 MIL/uL — ABNORMAL LOW (ref 3.87–5.11)
Retic Count, Absolute: 78.3 10*3/uL (ref 19.0–186.0)
Retic Ct Pct: 2.6 % (ref 0.4–3.1)
Reticulocyte Hemoglobin: 32.1 pg (ref >27.9)

## 2021-04-29 LAB — SAMPLE TO BLOOD BANK

## 2021-04-29 MED ORDER — CYANOCOBALAMIN 1000 MCG/ML IJ SOLN
1000.0000 ug | Freq: Once | INTRAMUSCULAR | Status: AC
  Administered 2021-04-29: 1000 ug via INTRAMUSCULAR
  Filled 2021-04-29: qty 1

## 2021-04-30 ENCOUNTER — Ambulatory Visit
Admission: RE | Admit: 2021-04-30 | Discharge: 2021-04-30 | Disposition: A | Discharge status: Home / Self Care | Payer: Medicare PPO | Source: Ambulatory Visit | Attending: Radiation Oncology | Admitting: Radiation Oncology

## 2021-04-30 DIAGNOSIS — Z510 Encounter for antineoplastic radiation therapy: Secondary | ICD-10-CM | POA: Diagnosis not present

## 2021-05-01 ENCOUNTER — Ambulatory Visit
Admission: RE | Admit: 2021-05-01 | Discharge: 2021-05-01 | Disposition: A | Discharge status: Home / Self Care | Payer: Medicare PPO | Source: Ambulatory Visit | Attending: Radiation Oncology | Admitting: Radiation Oncology

## 2021-05-01 DIAGNOSIS — Z510 Encounter for antineoplastic radiation therapy: Secondary | ICD-10-CM | POA: Diagnosis not present

## 2021-05-02 ENCOUNTER — Ambulatory Visit
Admission: RE | Admit: 2021-05-02 | Discharge: 2021-05-02 | Disposition: A | Discharge status: Home / Self Care | Payer: Medicare PPO | Source: Ambulatory Visit | Attending: Radiation Oncology | Admitting: Radiation Oncology

## 2021-05-02 DIAGNOSIS — Z510 Encounter for antineoplastic radiation therapy: Secondary | ICD-10-CM | POA: Diagnosis not present

## 2021-05-03 ENCOUNTER — Ambulatory Visit
Admission: RE | Admit: 2021-05-03 | Discharge: 2021-05-03 | Disposition: A | Discharge status: Home / Self Care | Payer: Medicare PPO | Source: Ambulatory Visit | Attending: Radiation Oncology | Admitting: Radiation Oncology

## 2021-05-03 DIAGNOSIS — Z510 Encounter for antineoplastic radiation therapy: Secondary | ICD-10-CM | POA: Diagnosis not present

## 2021-05-06 ENCOUNTER — Other Ambulatory Visit: Payer: Self-pay

## 2021-05-06 ENCOUNTER — Ambulatory Visit
Admission: RE | Admit: 2021-05-06 | Discharge: 2021-05-06 | Disposition: A | Discharge status: Home / Self Care | Payer: Medicare PPO | Source: Ambulatory Visit | Attending: Radiation Oncology | Admitting: Radiation Oncology

## 2021-05-06 ENCOUNTER — Inpatient Hospital Stay: Payer: Medicare PPO

## 2021-05-06 DIAGNOSIS — E538 Deficiency of other specified B group vitamins: Secondary | ICD-10-CM

## 2021-05-06 DIAGNOSIS — Z510 Encounter for antineoplastic radiation therapy: Secondary | ICD-10-CM | POA: Diagnosis not present

## 2021-05-06 MED ORDER — CYANOCOBALAMIN 1000 MCG/ML IJ SOLN
1000.0000 ug | Freq: Once | INTRAMUSCULAR | Status: AC
  Administered 2021-05-06: 1000 ug via INTRAMUSCULAR
  Filled 2021-05-06: qty 1

## 2021-05-07 ENCOUNTER — Telehealth: Payer: Self-pay

## 2021-05-07 ENCOUNTER — Ambulatory Visit
Admission: RE | Admit: 2021-05-07 | Discharge: 2021-05-07 | Disposition: A | Discharge status: Home / Self Care | Payer: Medicare PPO | Source: Ambulatory Visit | Attending: Radiation Oncology | Admitting: Radiation Oncology

## 2021-05-07 DIAGNOSIS — E538 Deficiency of other specified B group vitamins: Secondary | ICD-10-CM

## 2021-05-07 DIAGNOSIS — Z510 Encounter for antineoplastic radiation therapy: Secondary | ICD-10-CM | POA: Diagnosis not present

## 2021-05-07 DIAGNOSIS — D500 Iron deficiency anemia secondary to blood loss (chronic): Secondary | ICD-10-CM

## 2021-05-07 NOTE — Progress Notes (Signed)
Ph note created   

## 2021-05-07 NOTE — Telephone Encounter (Signed)
Spoke to pt, she is aware of appts and in agreement.

## 2021-05-07 NOTE — Telephone Encounter (Signed)
-----   Message from Earlie Server, MD sent at 05/06/2021 11:00 PM EST ----- Cbc showed hemoglobin dropped.  Recommend her to continue iron supplementation.  Repeat labs cbc cmp iron tibc ferritin retic panel, b12 level in 2 weeks prior to MD visit.thanks.

## 2021-05-07 NOTE — Telephone Encounter (Signed)
Patient informed of plan and verbalized understanding.   Please schedule pt for labs in 2 weeks and MD 1-2 days after labs.

## 2021-05-08 ENCOUNTER — Other Ambulatory Visit: Payer: Self-pay | Admitting: *Deleted

## 2021-05-08 ENCOUNTER — Ambulatory Visit
Admission: RE | Admit: 2021-05-08 | Discharge: 2021-05-08 | Disposition: A | Discharge status: Home / Self Care | Payer: Medicare PPO | Source: Ambulatory Visit | Attending: Radiation Oncology | Admitting: Radiation Oncology

## 2021-05-08 DIAGNOSIS — D500 Iron deficiency anemia secondary to blood loss (chronic): Secondary | ICD-10-CM

## 2021-05-08 DIAGNOSIS — Z510 Encounter for antineoplastic radiation therapy: Secondary | ICD-10-CM | POA: Diagnosis not present

## 2021-05-09 ENCOUNTER — Ambulatory Visit
Admission: RE | Admit: 2021-05-09 | Discharge: 2021-05-09 | Disposition: A | Discharge status: Home / Self Care | Payer: Medicare PPO | Source: Ambulatory Visit | Attending: Radiation Oncology | Admitting: Radiation Oncology

## 2021-05-09 DIAGNOSIS — Z510 Encounter for antineoplastic radiation therapy: Secondary | ICD-10-CM | POA: Diagnosis not present

## 2021-05-10 ENCOUNTER — Ambulatory Visit
Admission: RE | Admit: 2021-05-10 | Discharge: 2021-05-10 | Disposition: A | Discharge status: Home / Self Care | Payer: Medicare PPO | Source: Ambulatory Visit | Attending: Radiation Oncology | Admitting: Radiation Oncology

## 2021-05-10 DIAGNOSIS — Z510 Encounter for antineoplastic radiation therapy: Secondary | ICD-10-CM | POA: Diagnosis not present

## 2021-05-13 ENCOUNTER — Ambulatory Visit: Payer: Medicare PPO

## 2021-05-14 ENCOUNTER — Ambulatory Visit
Admission: RE | Admit: 2021-05-14 | Discharge: 2021-05-14 | Disposition: A | Discharge status: Home / Self Care | Payer: Medicare PPO | Source: Ambulatory Visit | Attending: Radiation Oncology | Admitting: Radiation Oncology

## 2021-05-14 DIAGNOSIS — Z510 Encounter for antineoplastic radiation therapy: Secondary | ICD-10-CM | POA: Diagnosis not present

## 2021-05-15 ENCOUNTER — Ambulatory Visit
Admission: RE | Admit: 2021-05-15 | Discharge: 2021-05-15 | Disposition: A | Discharge status: Home / Self Care | Payer: Medicare PPO | Source: Ambulatory Visit | Attending: Radiation Oncology | Admitting: Radiation Oncology

## 2021-05-15 DIAGNOSIS — E538 Deficiency of other specified B group vitamins: Secondary | ICD-10-CM | POA: Diagnosis not present

## 2021-05-15 DIAGNOSIS — D509 Iron deficiency anemia, unspecified: Secondary | ICD-10-CM | POA: Insufficient documentation

## 2021-05-15 DIAGNOSIS — Z510 Encounter for antineoplastic radiation therapy: Secondary | ICD-10-CM | POA: Diagnosis present

## 2021-05-15 DIAGNOSIS — C541 Malignant neoplasm of endometrium: Secondary | ICD-10-CM | POA: Insufficient documentation

## 2021-05-16 ENCOUNTER — Ambulatory Visit
Admission: RE | Admit: 2021-05-16 | Discharge: 2021-05-16 | Disposition: A | Discharge status: Home / Self Care | Payer: Medicare PPO | Source: Ambulatory Visit | Attending: Radiation Oncology | Admitting: Radiation Oncology

## 2021-05-16 DIAGNOSIS — Z510 Encounter for antineoplastic radiation therapy: Secondary | ICD-10-CM | POA: Diagnosis not present

## 2021-05-17 ENCOUNTER — Inpatient Hospital Stay: Payer: Medicare PPO

## 2021-05-17 ENCOUNTER — Other Ambulatory Visit: Payer: Self-pay

## 2021-05-17 ENCOUNTER — Ambulatory Visit
Admission: RE | Admit: 2021-05-17 | Discharge: 2021-05-17 | Disposition: A | Discharge status: Home / Self Care | Payer: Medicare PPO | Source: Ambulatory Visit | Attending: Radiation Oncology | Admitting: Radiation Oncology

## 2021-05-17 DIAGNOSIS — E538 Deficiency of other specified B group vitamins: Secondary | ICD-10-CM | POA: Insufficient documentation

## 2021-05-17 DIAGNOSIS — Z803 Family history of malignant neoplasm of breast: Secondary | ICD-10-CM | POA: Insufficient documentation

## 2021-05-17 DIAGNOSIS — D3500 Benign neoplasm of unspecified adrenal gland: Secondary | ICD-10-CM | POA: Insufficient documentation

## 2021-05-17 DIAGNOSIS — C541 Malignant neoplasm of endometrium: Secondary | ICD-10-CM | POA: Insufficient documentation

## 2021-05-17 DIAGNOSIS — D500 Iron deficiency anemia secondary to blood loss (chronic): Secondary | ICD-10-CM | POA: Insufficient documentation

## 2021-05-17 DIAGNOSIS — N950 Postmenopausal bleeding: Secondary | ICD-10-CM | POA: Insufficient documentation

## 2021-05-17 DIAGNOSIS — R5383 Other fatigue: Secondary | ICD-10-CM | POA: Insufficient documentation

## 2021-05-17 DIAGNOSIS — I872 Venous insufficiency (chronic) (peripheral): Secondary | ICD-10-CM | POA: Insufficient documentation

## 2021-05-17 DIAGNOSIS — Z79899 Other long term (current) drug therapy: Secondary | ICD-10-CM | POA: Insufficient documentation

## 2021-05-17 DIAGNOSIS — R160 Hepatomegaly, not elsewhere classified: Secondary | ICD-10-CM | POA: Insufficient documentation

## 2021-05-17 DIAGNOSIS — Z809 Family history of malignant neoplasm, unspecified: Secondary | ICD-10-CM | POA: Insufficient documentation

## 2021-05-17 DIAGNOSIS — Z801 Family history of malignant neoplasm of trachea, bronchus and lung: Secondary | ICD-10-CM | POA: Insufficient documentation

## 2021-05-17 DIAGNOSIS — Z510 Encounter for antineoplastic radiation therapy: Secondary | ICD-10-CM | POA: Diagnosis not present

## 2021-05-17 LAB — RETIC PANEL
Immature Retic Fract: 34.1 % — ABNORMAL HIGH (ref 2.3–15.9)
RBC.: 2.83 MIL/uL — ABNORMAL LOW (ref 3.87–5.11)
Retic Count, Absolute: 50.4 10*3/uL (ref 19.0–186.0)
Retic Ct Pct: 1.8 % (ref 0.4–3.1)
Reticulocyte Hemoglobin: 24.2 pg — ABNORMAL LOW (ref >27.9)

## 2021-05-17 LAB — CBC WITH DIFFERENTIAL/PLATELET
Abs Immature Granulocytes: 0.08 10*3/uL — ABNORMAL HIGH (ref 0.00–0.07)
Basophils Absolute: 0 10*3/uL (ref 0.0–0.1)
Basophils Relative: 0 %
Eosinophils Absolute: 0.4 10*3/uL (ref 0.0–0.5)
Eosinophils Relative: 8 %
HCT: 25 % — ABNORMAL LOW (ref 36.0–46.0)
Hemoglobin: 8 g/dL — ABNORMAL LOW (ref 12.0–15.0)
Immature Granulocytes: 2 %
Lymphocytes Relative: 9 %
Lymphs Abs: 0.5 10*3/uL — ABNORMAL LOW (ref 0.7–4.0)
MCH: 28.1 pg (ref 26.0–34.0)
MCHC: 32 g/dL (ref 30.0–36.0)
MCV: 87.7 fL (ref 80.0–100.0)
Monocytes Absolute: 0.5 10*3/uL (ref 0.1–1.0)
Monocytes Relative: 10 %
Neutro Abs: 3.5 10*3/uL (ref 1.7–7.7)
Neutrophils Relative %: 71 %
Platelets: 209 10*3/uL (ref 150–400)
RBC: 2.85 MIL/uL — ABNORMAL LOW (ref 3.87–5.11)
RDW: 16 % — ABNORMAL HIGH (ref 11.5–15.5)
Smear Review: NORMAL
WBC: 5 10*3/uL (ref 4.0–10.5)
nRBC: 0 % (ref 0.0–0.2)

## 2021-05-17 LAB — COMPREHENSIVE METABOLIC PANEL
ALT: 13 U/L (ref 0–44)
AST: 17 U/L (ref 15–41)
Albumin: 2.9 g/dL — ABNORMAL LOW (ref 3.5–5.0)
Alkaline Phosphatase: 53 U/L (ref 38–126)
Anion gap: 8 (ref 5–15)
BUN: 25 mg/dL — ABNORMAL HIGH (ref 8–23)
CO2: 21 mmol/L — ABNORMAL LOW (ref 22–32)
Calcium: 8.1 mg/dL — ABNORMAL LOW (ref 8.9–10.3)
Chloride: 107 mmol/L (ref 98–111)
Creatinine, Ser: 1.03 mg/dL — ABNORMAL HIGH (ref 0.44–1.00)
GFR, Estimated: 59 mL/min — ABNORMAL LOW (ref >60)
Glucose, Bld: 203 mg/dL — ABNORMAL HIGH (ref 70–99)
Potassium: 3.4 mmol/L — ABNORMAL LOW (ref 3.5–5.1)
Sodium: 136 mmol/L (ref 135–145)
Total Bilirubin: <0.1 mg/dL — ABNORMAL LOW (ref 0.3–1.2)
Total Protein: 6.7 g/dL (ref 6.5–8.1)

## 2021-05-17 LAB — IRON AND TIBC
Iron: 25 ug/dL — ABNORMAL LOW (ref 28–170)
Saturation Ratios: 9 % — ABNORMAL LOW (ref 10.4–31.8)
TIBC: 274 ug/dL (ref 250–450)
UIBC: 249 ug/dL

## 2021-05-17 LAB — FERRITIN: Ferritin: 59 ng/mL (ref 11–307)

## 2021-05-17 LAB — VITAMIN B12: Vitamin B-12: 1420 pg/mL — ABNORMAL HIGH (ref 180–914)

## 2021-05-20 ENCOUNTER — Ambulatory Visit
Admission: RE | Admit: 2021-05-20 | Discharge: 2021-05-20 | Disposition: A | Discharge status: Home / Self Care | Payer: Medicare PPO | Source: Ambulatory Visit | Attending: Radiation Oncology | Admitting: Radiation Oncology

## 2021-05-20 ENCOUNTER — Inpatient Hospital Stay (HOSPITAL_BASED_OUTPATIENT_CLINIC_OR_DEPARTMENT_OTHER): Payer: Medicare PPO | Admitting: Oncology

## 2021-05-20 ENCOUNTER — Encounter: Payer: Self-pay | Admitting: Oncology

## 2021-05-20 ENCOUNTER — Other Ambulatory Visit: Payer: Self-pay

## 2021-05-20 VITALS — BP 148/79 | HR 89 | Temp 97.7°F | Wt 295.0 lb

## 2021-05-20 DIAGNOSIS — I872 Venous insufficiency (chronic) (peripheral): Secondary | ICD-10-CM | POA: Insufficient documentation

## 2021-05-20 DIAGNOSIS — C541 Malignant neoplasm of endometrium: Secondary | ICD-10-CM

## 2021-05-20 DIAGNOSIS — E538 Deficiency of other specified B group vitamins: Secondary | ICD-10-CM

## 2021-05-20 DIAGNOSIS — Z510 Encounter for antineoplastic radiation therapy: Secondary | ICD-10-CM | POA: Diagnosis not present

## 2021-05-20 DIAGNOSIS — D500 Iron deficiency anemia secondary to blood loss (chronic): Secondary | ICD-10-CM | POA: Diagnosis not present

## 2021-05-20 NOTE — Progress Notes (Signed)
Patient here for follow up. Patient does not voice concerns at this time.  ?

## 2021-05-20 NOTE — Progress Notes (Signed)
Hematology/Oncology Progress note [phone] [phone]         Patient Care Team: Tracie Harrier, MD as PCP - General (Internal Medicine)  REFERRING PROVIDER: Tracie Harrier, MD Dr. Fransisca Connors CHIEF COMPLAINTS/REASON FOR VISIT:  anemia and endometrial cancer  HISTORY OF PRESENTING ILLNESS:   Valerie Case is a  71 y.o.  female with PMH listed below was seen in consultation at the request of  Tracie Harrier, MD  for evaluation of anemia.   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 04/01/2021 - 04/03/2021, patient was hospitalized due to symptomatic anemia, hemoglobin 6.9, heavy postmenopausal bleeding with passing large clots.  She also presented with increased creatinine level to 2.58 compared to her baseline level of 0.9 in November 2022.  Patient received IV iron infusion and 2 units of PRBC during the hospital stay..  04/02/2021, iron panel showed iron saturation 37, ferritin 37, TIBC 333-the studies were done after patient received blood transfusion.  At discharge, hemoglobin was 7.7. 04/01/2020, endometrial biopsy showed poorly differentiated endometrial adenocarcinoma.  #Last year, patient has experienced hematuria in September 2022. 12/12/2020, CT abdomen pelvis with contrast showed no radiographic evidence of urinary tract neoplasm, calculi, hydronephrosis.  2.6 cm nonspecific left adrenal mass. Hematuria persisted and 02/12/2021 CT hematuria work-up showed small uterine fibroids, no findings to explain hematuria. Hepatomegaly, diverticulosis without evidence of diverticulitis, Coronary artery disease  04/09/2021, vitamin B12 136. 04/10/2021, patient was seen by Dr. Fransisca Connors, patient continues to have vaginal bleeding, passing clots.  CBC was checked on 04/10/2021, hemoglobin was 8.2.  MCV 87 0.7.  Platelet count 406, white count of 10.3.  Patient received 1 unit of PRBC transfusion on  04/11/2020 prior to establish care with me on 04/11/2020 She was accompanied by her daughter.   INTERVAL HISTORY Valerie Case is a 71 y.o. female who has above history reviewed by me today presents for follow up visit for anemia and endometrial cancer Patient is currently on radiation treatments.  She has 5 more sessions.  Overall tolerates well. She denies any additional vaginal bleeding episodes.  + Fatigue Right lower extremity skin erythema/swelling has improved and resolved after taking doxycycline.   Review of Systems  Constitutional:  Positive for fatigue. Negative for chills and fever.  HENT:   Negative for hearing loss and voice change.   Eyes:  Negative for eye problems.  Respiratory:  Negative for chest tightness and cough.   Cardiovascular:  Negative for chest pain.  Gastrointestinal:  Negative for abdominal distention, abdominal pain and blood in stool.  Endocrine: Negative for hot flashes.  Genitourinary:  Negative for difficulty urinating, frequency and vaginal bleeding.        Postmenopausal bleeding  Musculoskeletal:  Negative for arthralgias.  Skin:  Negative for itching and rash.  Neurological:  Negative for extremity weakness.  Hematological:  Negative for adenopathy.  Psychiatric/Behavioral:  Negative for confusion.    MEDICAL HISTORY:  Past Medical History:  Diagnosis Date   Asthma    Depression 04/01/2021   Diabetes mellitus without complication ([place])    Essential hypertension 04/01/2021   Hx of dysplastic nevus 07/16/2010   RLQA   Hypertension    Insulin dependent type 2 diabetes mellitus ([place]) 04/01/2021    SURGICAL HISTORY: Past Surgical History:  Procedure Laterality Date   CESAREAN SECTION  01/23/1979   TONSILLECTOMY  03/17/1956    SOCIAL HISTORY: Social History   Socioeconomic History   Marital status: Divorced  Spouse name: Not on file   Number of children: Not on file   Years of education: Not on file   Highest education level:  Not on file  Occupational History   Not on file  Tobacco Use   Smoking status: Never   Smokeless tobacco: Never  Vaping Use   Vaping Use: Never used  Substance and Sexual Activity   Alcohol use: Not Currently   Drug use: Never   Sexual activity: Not Currently  Other Topics Concern   Not on file  Social History Narrative      Social Determinants of Health   Financial Resource Strain: Not on file  Food Insecurity: Not on file  Transportation Needs: Not on file  Physical Activity: Not on file  Stress: Not on file  Social Connections: Not on file  Intimate Partner Violence: Not on file    FAMILY HISTORY: Family History  Problem Relation Age of Onset   Breast cancer Mother 53   Cancer Mother    Cancer Father    Lung cancer Father    Breast cancer Cousin        maternal side    ALLERGIES:  is allergic to aspirin.  MEDICATIONS:  Current Outpatient Medications  Medication Sig Dispense Refill   acetaminophen (TYLENOL) 650 MG CR tablet Take 1,300 mg by mouth every 8 (eight) hours as needed for pain.     albuterol (VENTOLIN HFA) 108 (90 Base) MCG/ACT inhaler Inhale 2 puffs into the lungs every 6 (six) hours as needed for wheezing or shortness of breath.     doxycycline (VIBRA-TABS) 100 MG tablet Take 1 tablet (100 mg total) by mouth 2 (two) times daily. 10 tablet 0   ferrous sulfate 325 (65 FE) MG EC tablet Take 325 mg by mouth daily with breakfast.     FLUoxetine (PROZAC) 10 MG capsule Take 10 mg by mouth daily.     fluticasone (FLONASE) 50 MCG/ACT nasal spray Place 1 spray into both nostrils daily as needed for allergies or rhinitis.     hydrochlorothiazide (HYDRODIURIL) 12.5 MG tablet Take 12.5 mg by mouth daily.     ibuprofen (ADVIL) 200 MG tablet Take 400 mg by mouth every 8 (eight) hours as needed for moderate pain.     metoprolol succinate (TOPROL-XL) 25 MG 24 hr tablet Take 25 mg by mouth daily.     NOVOLIN 70/30 FLEXPEN (70-30) 100 UNIT/ML KwikPen 50-110 Units See  admin instructions. 50 units in the morning, 110 units at bedtime     OZEMPIC, 1 MG/DOSE, 4 MG/3ML SOPN Inject 1 mg into the skin every Tuesday.     rosuvastatin (CRESTOR) 10 MG tablet Take 10 mg by mouth daily.     empagliflozin (JARDIANCE) 25 MG TABS tablet Take 25 mg by mouth daily. (Patient not taking: Reported on 04/11/2021)     lisinopril (ZESTRIL) 40 MG tablet Take 40 mg by mouth at bedtime. (Patient not taking: Reported on 04/11/2021)     medroxyPROGESTERone (PROVERA) 10 MG tablet Take 2 tablets (20 mg total) by mouth every 8 (eight) hours for 7 days. 42 tablet 0   metFORMIN (GLUCOPHAGE-XR) 500 MG 24 hr tablet Take 1,000 mg by mouth at bedtime. (Patient not taking: Reported on 04/11/2021)     No current facility-administered medications for this visit.     PHYSICAL EXAMINATION: ECOG PERFORMANCE STATUS: 1 - Symptomatic but completely ambulatory Vitals:   05/20/21 0923  BP: (!) 148/79  Pulse: 89  Temp: 97.7 F (36.5 C)  Filed Weights   05/20/21 0923  Weight: 295 lb (133.8 kg)    Physical Exam Constitutional:      General: She is not in acute distress.    Appearance: She is obese.  HENT:     Head: Normocephalic and atraumatic.  Eyes:     General: No scleral icterus. Cardiovascular:     Rate and Rhythm: Normal rate and regular rhythm.     Heart sounds: Normal heart sounds.  Pulmonary:     Effort: Pulmonary effort is normal. No respiratory distress.     Breath sounds: No wheezing.  Abdominal:     General: Bowel sounds are normal. There is no distension.     Palpations: Abdomen is soft.  Musculoskeletal:        General: No deformity. Normal range of motion.     Cervical back: Normal range of motion and neck supple.     Right lower leg: Edema present.     Left lower leg: Edema present.     Comments: No focal erythema.  Skin is intact. Patient has chronic skin-leathery  Skin:    General: Skin is warm and dry.     Findings: No erythema or rash.  Neurological:      Mental Status: She is alert and oriented to person, place, and time. Mental status is at baseline.     Cranial Nerves: No cranial nerve deficit.     Coordination: Coordination normal.  Psychiatric:        Mood and Affect: Mood normal.     LABORATORY DATA:  I have reviewed the data as listed Lab Results  Component Value Date   WBC 5.0 05/17/2021   HGB 8.0 (L) 05/17/2021   HCT 25.0 (L) 05/17/2021   MCV 87.7 05/17/2021   PLT 209 05/17/2021   Recent Labs    04/01/21 1736 04/02/21 0642 04/03/21 0440 04/11/21 1553 05/17/21 1024  NA 134* 138 135  --  136  K 5.1 4.5 4.2  --  3.4*  CL 103 107 107  --  107  CO2 21* 21* 24  --  21*  GLUCOSE 244* 215* 221*  --  203*  BUN 58* 55* 49*  --  25*  CREATININE 2.58* 1.76* 1.11*  --  1.03*  CALCIUM 8.8* 8.7* 8.6*  --  8.1*  GFRNONAA 20* 31* 54*  --  59*  PROT 6.8 6.4*  --   --  6.7  ALBUMIN 3.7 3.3*  --   --  2.9*  AST 15 14*  --   --  17  ALT 11 12  --   --  13  ALKPHOS 59 61  --   --  53  BILITOT 0.3 0.5  --  0.3 <0.1*  BILIDIR  --   --   --  <0.1  --   IBILI  --   --   --  NOT CALCULATED  --     Iron/TIBC/Ferritin/ %Sat    Component Value Date/Time   IRON 25 (L) 05/17/2021 1024   IRON 84 06/08/2012 1128   TIBC 274 05/17/2021 1024   TIBC 403 06/08/2012 1128   FERRITIN 59 05/17/2021 1024   FERRITIN 68 06/08/2012 1128   IRONPCTSAT 9 (L) 05/17/2021 1024   IRONPCTSAT 21 06/08/2012 1128      RADIOGRAPHIC STUDIES: I have personally reviewed the radiological images as listed and agreed with the findings in the report. No results found.    ASSESSMENT & PLAN:  1. Iron deficiency  anemia due to chronic blood loss   2. Endometrial adenocarcinoma ([place])   3. B12 deficiency   4. Venous insufficiency of both lower extremities    #Normocytic anemia, could be secondary to acute blood loss/radiation treatments. Labs reviewed and discussed with patient. Hemoglobin is 8. Iron panel showed a decreased iron saturation.  Ferritin is  normal. Discussed with patient about IV Venofer treatments.  Rationale and potential side effects of IV Venofer treatments were reviewed and discussed with patient. She agrees with the plan.  Plan IV Venofer treatment weekly x2             #Vitamin B12 deficiency, B12 level has improved.  Continue oral vitamin B12 supplementation. #Chronic bilateral lower extremity swelling, skin changes, likely chronic venous insufficiency.  Recommend compression stocking.  #Endometrial carcinoma, FIGO stage III.                                                                                                                                                  Continue radiation.  Follow-up with gynecology oncology  . #Adrenal mass,  lipid poor hyperfunctioning adenoma vs mets.  She will repeat PET after she finishes radiation.  Hepatomegaly, she has history of fatty liver disease.    All questions were answered. The patient knows to call the clinic with any problems questions or concerns.  cc Tracie Harrier, MD    Return of visit: To be determined.  Thank you for this kind referral and the opportunity to participate in the care of this patient. A copy of today's note is routed to referring provider   Earlie Server, MD, [name] [facility] Hematology Oncology 05/20/2021

## 2021-05-21 ENCOUNTER — Other Ambulatory Visit: Payer: Self-pay | Admitting: *Deleted

## 2021-05-21 ENCOUNTER — Ambulatory Visit
Admission: RE | Admit: 2021-05-21 | Discharge: 2021-05-21 | Disposition: A | Discharge status: Home / Self Care | Payer: Medicare PPO | Source: Ambulatory Visit | Attending: Radiation Oncology | Admitting: Radiation Oncology

## 2021-05-21 DIAGNOSIS — C541 Malignant neoplasm of endometrium: Secondary | ICD-10-CM

## 2021-05-21 DIAGNOSIS — Z510 Encounter for antineoplastic radiation therapy: Secondary | ICD-10-CM | POA: Diagnosis not present

## 2021-05-22 ENCOUNTER — Ambulatory Visit
Admission: RE | Admit: 2021-05-22 | Discharge: 2021-05-22 | Disposition: A | Discharge status: Home / Self Care | Payer: Medicare PPO | Source: Ambulatory Visit | Attending: Radiation Oncology | Admitting: Radiation Oncology

## 2021-05-22 DIAGNOSIS — Z510 Encounter for antineoplastic radiation therapy: Secondary | ICD-10-CM | POA: Diagnosis not present

## 2021-05-23 ENCOUNTER — Ambulatory Visit
Admission: RE | Admit: 2021-05-23 | Discharge: 2021-05-23 | Disposition: A | Discharge status: Home / Self Care | Payer: Medicare PPO | Source: Ambulatory Visit | Attending: Radiation Oncology | Admitting: Radiation Oncology

## 2021-05-23 ENCOUNTER — Other Ambulatory Visit: Payer: Self-pay

## 2021-05-23 ENCOUNTER — Inpatient Hospital Stay: Payer: Medicare PPO

## 2021-05-23 VITALS — BP 140/88 | HR 85 | Temp 98.4°F | Resp 16

## 2021-05-23 DIAGNOSIS — E538 Deficiency of other specified B group vitamins: Secondary | ICD-10-CM

## 2021-05-23 DIAGNOSIS — Z510 Encounter for antineoplastic radiation therapy: Secondary | ICD-10-CM | POA: Diagnosis not present

## 2021-05-23 MED ORDER — IRON SUCROSE 20 MG/ML IV SOLN
200.0000 mg | Freq: Once | INTRAVENOUS | Status: AC
  Administered 2021-05-23: 12:00:00 200 mg via INTRAVENOUS
  Filled 2021-05-23: qty 10

## 2021-05-23 MED ORDER — SODIUM CHLORIDE 0.9 % IV SOLN: 200.0000 mg | Freq: Once | INTRAVENOUS | Status: DC

## 2021-05-23 MED ORDER — SODIUM CHLORIDE 0.9 % IV SOLN
Freq: Once | INTRAVENOUS | Status: AC
  Filled 2021-05-23: qty 250

## 2021-05-23 NOTE — Patient Instructions (Signed)

## 2021-05-24 ENCOUNTER — Ambulatory Visit
Admission: RE | Admit: 2021-05-24 | Discharge: 2021-05-24 | Disposition: A | Discharge status: Home / Self Care | Payer: Medicare PPO | Source: Ambulatory Visit | Attending: Radiation Oncology | Admitting: Radiation Oncology

## 2021-05-24 DIAGNOSIS — Z510 Encounter for antineoplastic radiation therapy: Secondary | ICD-10-CM | POA: Diagnosis not present

## 2021-05-27 ENCOUNTER — Inpatient Hospital Stay: Payer: Medicare PPO

## 2021-05-27 ENCOUNTER — Ambulatory Visit
Admission: RE | Admit: 2021-05-27 | Discharge: 2021-05-27 | Disposition: A | Discharge status: Home / Self Care | Payer: Medicare PPO | Source: Ambulatory Visit | Attending: Radiation Oncology | Admitting: Radiation Oncology

## 2021-05-27 ENCOUNTER — Other Ambulatory Visit: Payer: Self-pay

## 2021-05-27 VITALS — BP 169/58 | HR 82 | Temp 99.0°F | Resp 18

## 2021-05-27 DIAGNOSIS — Z510 Encounter for antineoplastic radiation therapy: Secondary | ICD-10-CM | POA: Diagnosis not present

## 2021-05-27 DIAGNOSIS — E538 Deficiency of other specified B group vitamins: Secondary | ICD-10-CM

## 2021-05-27 MED ORDER — SODIUM CHLORIDE 0.9 % IV SOLN: 200.0000 mg | Freq: Once | INTRAVENOUS | Status: DC

## 2021-05-27 MED ORDER — SODIUM CHLORIDE 0.9 % IV SOLN
Freq: Once | INTRAVENOUS | Status: AC
  Filled 2021-05-27: qty 250

## 2021-05-27 MED ORDER — IRON SUCROSE 20 MG/ML IV SOLN
200.0000 mg | Freq: Once | INTRAVENOUS | Status: AC
  Administered 2021-05-27: 200 mg via INTRAVENOUS
  Filled 2021-05-27: qty 10

## 2021-05-28 ENCOUNTER — Ambulatory Visit
Admission: RE | Admit: 2021-05-28 | Discharge: 2021-05-28 | Disposition: A | Discharge status: Home / Self Care | Payer: Medicare PPO | Source: Ambulatory Visit | Attending: Radiation Oncology | Admitting: Radiation Oncology

## 2021-05-28 ENCOUNTER — Ambulatory Visit: Payer: Medicare PPO

## 2021-05-28 DIAGNOSIS — Z510 Encounter for antineoplastic radiation therapy: Secondary | ICD-10-CM | POA: Diagnosis not present

## 2021-05-29 ENCOUNTER — Ambulatory Visit
Admission: RE | Admit: 2021-05-29 | Discharge: 2021-05-29 | Disposition: A | Discharge status: Home / Self Care | Payer: Medicare PPO | Source: Ambulatory Visit | Attending: Radiation Oncology | Admitting: Radiation Oncology

## 2021-05-29 DIAGNOSIS — Z510 Encounter for antineoplastic radiation therapy: Secondary | ICD-10-CM | POA: Diagnosis not present

## 2021-06-10 ENCOUNTER — Other Ambulatory Visit: Payer: Self-pay

## 2021-06-10 ENCOUNTER — Ambulatory Visit (HOSPITAL_COMMUNITY)
Admission: RE | Admit: 2021-06-10 | Discharge: 2021-06-10 | Disposition: A | Discharge status: Home / Self Care | Payer: Medicare PPO | Source: Ambulatory Visit | Attending: Radiation Oncology | Admitting: Radiation Oncology

## 2021-06-10 DIAGNOSIS — C541 Malignant neoplasm of endometrium: Secondary | ICD-10-CM | POA: Diagnosis present

## 2021-06-10 LAB — GLUCOSE, CAPILLARY: Glucose-Capillary: 131 mg/dL — ABNORMAL HIGH (ref 70–99)

## 2021-06-10 MED ORDER — FLUDEOXYGLUCOSE F - 18 (FDG) INJECTION
15.8000 | Freq: Once | INTRAVENOUS | Status: AC
  Administered 2021-06-10: 14.6 via INTRAVENOUS

## 2021-06-26 ENCOUNTER — Inpatient Hospital Stay: Payer: Medicare PPO

## 2021-06-26 ENCOUNTER — Inpatient Hospital Stay: Payer: Medicare PPO | Attending: Obstetrics and Gynecology | Admitting: Obstetrics and Gynecology

## 2021-06-26 ENCOUNTER — Ambulatory Visit
Admission: RE | Admit: 2021-06-26 | Discharge: 2021-06-26 | Disposition: A | Discharge status: Home / Self Care | Payer: Medicare PPO | Source: Ambulatory Visit | Attending: Radiation Oncology | Admitting: Radiation Oncology

## 2021-06-26 VITALS — BP 164/77 | HR 83 | Temp 99.3°F | Resp 18 | Ht 68.0 in | Wt 282.0 lb

## 2021-06-26 DIAGNOSIS — Z923 Personal history of irradiation: Secondary | ICD-10-CM | POA: Insufficient documentation

## 2021-06-26 DIAGNOSIS — R350 Frequency of micturition: Secondary | ICD-10-CM | POA: Insufficient documentation

## 2021-06-26 DIAGNOSIS — E8809 Other disorders of plasma-protein metabolism, not elsewhere classified: Secondary | ICD-10-CM | POA: Insufficient documentation

## 2021-06-26 DIAGNOSIS — Z803 Family history of malignant neoplasm of breast: Secondary | ICD-10-CM | POA: Diagnosis not present

## 2021-06-26 DIAGNOSIS — F32A Depression, unspecified: Secondary | ICD-10-CM | POA: Insufficient documentation

## 2021-06-26 DIAGNOSIS — R600 Localized edema: Secondary | ICD-10-CM | POA: Diagnosis not present

## 2021-06-26 DIAGNOSIS — Z79899 Other long term (current) drug therapy: Secondary | ICD-10-CM | POA: Insufficient documentation

## 2021-06-26 DIAGNOSIS — I872 Venous insufficiency (chronic) (peripheral): Secondary | ICD-10-CM | POA: Diagnosis not present

## 2021-06-26 DIAGNOSIS — E278 Other specified disorders of adrenal gland: Secondary | ICD-10-CM | POA: Insufficient documentation

## 2021-06-26 DIAGNOSIS — R3915 Urgency of urination: Secondary | ICD-10-CM | POA: Insufficient documentation

## 2021-06-26 DIAGNOSIS — K5900 Constipation, unspecified: Secondary | ICD-10-CM | POA: Insufficient documentation

## 2021-06-26 DIAGNOSIS — D649 Anemia, unspecified: Secondary | ICD-10-CM | POA: Diagnosis present

## 2021-06-26 DIAGNOSIS — Z809 Family history of malignant neoplasm, unspecified: Secondary | ICD-10-CM | POA: Insufficient documentation

## 2021-06-26 DIAGNOSIS — K5730 Diverticulosis of large intestine without perforation or abscess without bleeding: Secondary | ICD-10-CM | POA: Insufficient documentation

## 2021-06-26 DIAGNOSIS — E878 Other disorders of electrolyte and fluid balance, not elsewhere classified: Secondary | ICD-10-CM | POA: Insufficient documentation

## 2021-06-26 DIAGNOSIS — E538 Deficiency of other specified B group vitamins: Secondary | ICD-10-CM | POA: Diagnosis not present

## 2021-06-26 DIAGNOSIS — C541 Malignant neoplasm of endometrium: Secondary | ICD-10-CM | POA: Insufficient documentation

## 2021-06-26 DIAGNOSIS — R160 Hepatomegaly, not elsewhere classified: Secondary | ICD-10-CM | POA: Insufficient documentation

## 2021-06-26 DIAGNOSIS — I10 Essential (primary) hypertension: Secondary | ICD-10-CM | POA: Insufficient documentation

## 2021-06-26 DIAGNOSIS — Z801 Family history of malignant neoplasm of trachea, bronchus and lung: Secondary | ICD-10-CM | POA: Insufficient documentation

## 2021-06-26 DIAGNOSIS — D259 Leiomyoma of uterus, unspecified: Secondary | ICD-10-CM | POA: Diagnosis not present

## 2021-06-26 DIAGNOSIS — E119 Type 2 diabetes mellitus without complications: Secondary | ICD-10-CM | POA: Diagnosis not present

## 2021-06-26 DIAGNOSIS — L03115 Cellulitis of right lower limb: Secondary | ICD-10-CM | POA: Diagnosis not present

## 2021-06-26 DIAGNOSIS — I2510 Atherosclerotic heart disease of native coronary artery without angina pectoris: Secondary | ICD-10-CM | POA: Diagnosis not present

## 2021-06-26 LAB — COMPREHENSIVE METABOLIC PANEL
ALT: 15 U/L (ref 0–44)
AST: 18 U/L (ref 15–41)
Albumin: 3.9 g/dL (ref 3.5–5.0)
Alkaline Phosphatase: 71 U/L (ref 38–126)
Anion gap: 8 (ref 5–15)
BUN: 31 mg/dL — ABNORMAL HIGH (ref 8–23)
CO2: 23 mmol/L (ref 22–32)
Calcium: 9.7 mg/dL (ref 8.9–10.3)
Chloride: 105 mmol/L (ref 98–111)
Creatinine, Ser: 0.78 mg/dL (ref 0.44–1.00)
GFR, Estimated: >60 mL/min (ref >60)
Glucose, Bld: 153 mg/dL — ABNORMAL HIGH (ref 70–99)
Potassium: 3.9 mmol/L (ref 3.5–5.1)
Sodium: 136 mmol/L (ref 135–145)
Total Bilirubin: 0.3 mg/dL (ref 0.3–1.2)
Total Protein: 7.6 g/dL (ref 6.5–8.1)

## 2021-06-26 LAB — CBC WITH DIFFERENTIAL/PLATELET
Abs Immature Granulocytes: 0.02 10*3/uL (ref 0.00–0.07)
Basophils Absolute: 0 10*3/uL (ref 0.0–0.1)
Basophils Relative: 1 %
Eosinophils Absolute: 0.6 10*3/uL — ABNORMAL HIGH (ref 0.0–0.5)
Eosinophils Relative: 11 %
HCT: 34.8 % — ABNORMAL LOW (ref 36.0–46.0)
Hemoglobin: 11.2 g/dL — ABNORMAL LOW (ref 12.0–15.0)
Immature Granulocytes: 0 %
Lymphocytes Relative: 13 %
Lymphs Abs: 0.7 10*3/uL (ref 0.7–4.0)
MCH: 28.3 pg (ref 26.0–34.0)
MCHC: 32.2 g/dL (ref 30.0–36.0)
MCV: 87.9 fL (ref 80.0–100.0)
Monocytes Absolute: 0.5 10*3/uL (ref 0.1–1.0)
Monocytes Relative: 10 %
Neutro Abs: 3.4 10*3/uL (ref 1.7–7.7)
Neutrophils Relative %: 65 %
Platelets: 295 10*3/uL (ref 150–400)
RBC: 3.96 MIL/uL (ref 3.87–5.11)
RDW: 16.6 % — ABNORMAL HIGH (ref 11.5–15.5)
WBC: 5.2 10*3/uL (ref 4.0–10.5)
nRBC: 0 % (ref 0.0–0.2)

## 2021-06-26 LAB — HEMOGLOBIN A1C
Hgb A1c MFr Bld: 6.9 % — ABNORMAL HIGH (ref 4.8–5.6)
Mean Plasma Glucose: 151.33 mg/dL

## 2021-06-26 NOTE — Progress Notes (Signed)
Radiation Oncology ?Follow up Note ? ?Name: Valerie Case   ?Date:   06/26/2021 ?MRN:  [id number] ?DOB: 01-12-1951  ? ? ?This 71 y.o. female presents to the clinic today for 1 month follow-up status post radiation therapy external beam for high-grade endometrial carcinoma FIGO grade 3 and patient not a surgical candidate based on medical comorbidities. ? ?REFERRING PROVIDER: Tracie Harrier, MD ? 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  She is seen today in follow-up and is doing well she specifically denies vaginal bleeding.  Her bowels are tending towards constipation.  She does have urinary frequency and urgency although she does take Azo.  She just saw Dr. Theora Gianotti had a pelvic exam which was unremarkable.  She also recently had a PET CT scan.  Showing substantial reduction in size and activity of the uterine mass and substantial reduction in size and activity the right pelvic sidewall nodes.  She does have a hypermetabolic persistent left adrenal mass which is being scheduled for biopsy. ? ?COMPLICATIONS OF TREATMENT: none ? ?FOLLOW UP COMPLIANCE: keeps appointments  ? ?PHYSICAL EXAM:  ?There were no vitals taken for this visit. ?Obese female wheelchair-bound in NAD.  Well-developed well-nourished patient in NAD. HEENT reveals PERLA, EOMI, discs not visualized.  Oral cavity is clear. No oral mucosal lesions are identified. Neck is clear without evidence of cervical or supraclavicular adenopathy. Lungs are clear to A&P. Cardiac examination is essentially unremarkable with regular rate and rhythm without murmur rub or thrill. Abdomen is benign with no organomegaly or masses noted. Motor sensory and DTR levels are equal and symmetric in the upper and lower extremities. Cranial nerves II through XII are grossly intact. Proprioception is intact. No peripheral adenopathy or edema is identified. No motor or sensory levels  are noted. Crude visual fields are within normal range. ? ?RADIOLOGY RESULTS: PET CT scan reviewed compatible with above-stated findings ? ?PLAN: Present time patient clinically is doing well with excellent response by PET/CT criteria.  She is being considered for surgery is having work-up including cardiac evaluation.  She is also being scheduled for biopsy of her adrenal mass.  Should this be the only site of active disease SBRT to that area of adrenal metastasis may be indicated.  I have asked to see her back in 4 to 5 months for follow-up.  We will continue to follow her progress through GYN oncology. ? ?I would like to take this opportunity to thank you for allowing me to participate in the care of your patient.. ?  ? Noreene Filbert, MD ? ?

## 2021-06-26 NOTE — Progress Notes (Signed)
Gynecologic Oncology Interval Visit  ? ?Referring Provider: Dr. Glennon Mac ? ?Chief Complaint: FIGO grade III endometrial adenocarcinoma ? ?Subjective:  ?Valerie Case is a 71 y.o. female who is seen in consultation from Dr. Glennon Mac for poorly differentiated endometrial adenocarcinoma, figo grade III.  ? ?She presents for follow up and consideration of surgery,.  ? ?04/02/2021 BILATERAL LOWER EXTREMITY VENOUS DOPPLER ULTRASOUND ?No evidence of deep venous thrombosis in either lower extremity. ? ?04/16/2021 PET ?IMPRESSION: ?1. Large hypermetabolic endometrial mass consistent with known endometrial cancer. Suspect direct invasion/involvement of the right adnexa. Right pelvic sidewall hypermetabolic adenopathy. ?2. 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 ?3. No findings for metastatic disease involving the chest or bony structures. ? ? ?04/24/2021 Tumor Board Documentation ?Patient to undergo radiation therapy. Plan to reassess after radiation with imaging to re-evaluate possible surgical intervention.   ?  ?04/24/2021 - 05/29/2021: She completed WPRT Dose/Fx (Gy): 1.8 #Fx: 25 / 25. Total Dose (Gy): 45 ? ?06/12/2021 PET ?IMPRESSION: ?1. Substantial reduction in size and activity of the uterine mass. Substantial reduction in size and activity of the right pelvic sidewall lymph node. ?2. The moderately hypermetabolic left adrenal mass is stable in size back through earliest available compare comparison of 12/12/2020, with roughly similar activity level to previous. The density characteristics are not specific for adrenal adenoma. Possibilities include benign and malignant adrenal neoplasms versus metastatic lesion. ?3. Other imaging findings of potential clinical significance: Left mastoid effusion. Coronary and aortic atherosclerosis. Systemic atherosclerosis. Mild cardiomegaly. Lax anterior abdominal wall. Suspected  cholelithiasis. Sigmoid colon diverticulosis. Congenital anomalies of the C1 vertebra. Anterolisthesis at L4-5. ? ? ?She is doing better after finishing external radiation. She still came to clinic in a wheelchair. She has improved activity level at home and up our the bed or chair 50% of the time. She can do her own ADLs. She does not have stairs; she can walk a little more than 1/2 a block. She has not seen a cardiologist or pulmonologist. She had a PCP that she will be seeing soon. Her diabetes control is suboptimal and her fast blood sugars have been ~130s. She did get two doses if IV iron for iron deficiency anemia.  ? ? ?Gynecologic Oncology History ?Valerie Case is a pleasant female who is seen in consultation from Dr. Glennon Mac for poorly differentiated endometrial adenocarcinoma, figo grade III.  ? ?PMH significant for hypertension, diabetes (last HgbA1c 6.2 on 04/02/21), depression, obesity (BMI 43.5), OSA who presented for postmenopausal bleeding, anemia, and renal failure. Hemoglobin was found to be 6.9, Cr 2.58 (baseline 0.9). She had BLE edema and had ultrasound which was negative for DVT. She was started on Provera 20 mg TID x 7 days which improved her bleeding. She was admitted 04/01/21 to [facility] and received 2 units pRBCs and iron infusion.  ? ?She underwent endometrial biopsy which revealed: ?- poorly differentiated endometrial adenocarcinoma FIGO III ? ?Patient had experienced hematuria in September, pcp ordered ct abdomen pelvis w contrast which did not show pelvic mass. It did show incidental 2.6 cm left adrenal mass. Patient did not have history of cancer at that time so adrenal protocol CT was deferred for ct abdomen.  ? ?Hematuria persisted and CT Hematuria workup was performed 02/12/21 which showed small uterine fibroids but no findings to explain hematuria.  ? ? ? ?Problem List: ?Patient Active Problem List  ? Diagnosis Date Noted  ? Venous insufficiency of both  lower extremities 05/20/2021   ? Hepatomegaly 04/14/2021  ? Endometrial adenocarcinoma ([place]) 04/14/2021  ? Adrenal mass ([place]) 04/14/2021  ? Goals of care, counseling/discussion 04/14/2021  ? Cellulitis of right lower extremity 04/14/2021  ? B12 deficiency 04/11/2021  ? Normocytic anemia 04/11/2021  ? Symptomatic anemia 04/01/2021  ? AKI (acute kidney injury) ([place]) 04/01/2021  ? Essential hypertension 04/01/2021  ? Insulin dependent type 2 diabetes mellitus ([place]) 04/01/2021  ? Depression 04/01/2021  ? Recent unintentional weight loss over several months 04/01/2021  ? History of asthma 04/01/2021  ? Thrush, oral 04/01/2021  ? Bilateral lower extremity edema 04/01/2021  ? ? ? ?Past Medical History: ?Past Medical History:  ?Diagnosis Date  ? Asthma   ? Depression 04/01/2021  ? Diabetes mellitus without complication ([place])   ? Essential hypertension 04/01/2021  ? Hx of dysplastic nevus 07/16/2010  ? RLQA  ? Hypertension   ? Insulin dependent type 2 diabetes mellitus ([place]) 04/01/2021  ? ? ?Past Surgical History: ?Past Surgical History:  ?Procedure Laterality Date  ? CESAREAN SECTION  01/23/1979  ? TONSILLECTOMY  03/17/1956  ? ? ?Past Gynecologic History:  ?Post menopausal - 2006 ?STD: denies ? ? ?OB History:  ?OB History  ?Gravida Para Term Preterm AB Living  ?'1 1 1     1  '$ ?SAB IAB Ectopic Multiple Live Births  ?        1  ?  ?# Outcome Date GA Lbr Len/2nd Weight Sex Delivery Anes PTL Lv  ?1 Term 1980     CS-Unspec   LIV  ? ? ?Family History: ?Family History  ?Problem Relation Age of Onset  ? Breast cancer Mother 64  ? Cancer Mother   ? Cancer Father   ? Lung cancer Father   ? Breast cancer Cousin   ?     maternal side  ? ? ?Social History: ?Social History  ? ?Socioeconomic History  ? Marital status: Divorced  ?  Spouse name: Not on file  ? Number of children: Not on file  ? Years of education: Not on file  ? Highest education level: Not on file  ?Occupational History  ? Not on file  ?Tobacco Use  ? Smoking status: Never  ? Smokeless tobacco: Never   ?Vaping Use  ? Vaping Use: Never used  ?Substance and Sexual Activity  ? Alcohol use: Not Currently  ? Drug use: Never  ? Sexual activity: Not Currently  ?Other Topics Concern  ? Not on file  ?Social History Narrative  ?   ? ?Social Determinants of Health  ? ?Financial Resource Strain: Not on file  ?Food Insecurity: Not on file  ?Transportation Needs: Not on file  ?Physical Activity: Not on file  ?Stress: Not on file  ?Social Connections: Not on file  ?Intimate Partner Violence: Not on file  ? ? ?Allergies: ?Allergies  ?Allergen Reactions  ? Aspirin Rash  ?  Childhood reaction   ? ? ?Current Medications: ?Current Outpatient Medications  ?Medication Sig Dispense Refill  ? acetaminophen (TYLENOL) 650 MG CR tablet Take 1,300 mg by mouth every 8 (eight) hours as needed for pain.    ? albuterol (VENTOLIN HFA) 108 (90 Base) MCG/ACT inhaler Inhale 2 puffs into the lungs every 6 (six) hours as needed for wheezing or shortness of breath.    ? empagliflozin (JARDIANCE) 25 MG TABS tablet Take 25 mg by mouth daily.    ? ferrous sulfate 325 (65 FE) MG EC tablet Take 325 mg by mouth daily with  breakfast.    ? FLUoxetine (PROZAC) 10 MG capsule Take 10 mg by mouth daily.    ? fluticasone (FLONASE) 50 MCG/ACT nasal spray Place 1 spray into both nostrils daily as needed for allergies or rhinitis.    ? hydrochlorothiazide (HYDRODIURIL) 12.5 MG tablet Take 12.5 mg by mouth daily.    ? ibuprofen (ADVIL) 200 MG tablet Take 400 mg by mouth every 8 (eight) hours as needed for moderate pain.    ? metoprolol succinate (TOPROL-XL) 25 MG 24 hr tablet Take 25 mg by mouth daily.    ? NOVOLIN 70/30 FLEXPEN (70-30) 100 UNIT/ML KwikPen 50-110 Units See admin instructions. 50 units in the morning, 110 units at bedtime    ? OZEMPIC, 1 MG/DOSE, 4 MG/3ML SOPN Inject 1 mg into the skin every Tuesday.    ? rosuvastatin (CRESTOR) 10 MG tablet Take 10 mg by mouth daily.    ? lisinopril (ZESTRIL) 40 MG tablet Take 40 mg by mouth at bedtime. (Patient not  taking: Reported on 04/11/2021)    ? metFORMIN (GLUCOPHAGE-XR) 500 MG 24 hr tablet Take 1,000 mg by mouth at bedtime. (Patient not taking: Reported on 04/11/2021)    ? ?No current facility-administered medications fo

## 2021-06-27 LAB — CALCIUM, IONIZED: Calcium, Ionized, Serum: 5.6 mg/dL (ref 4.5–5.6)

## 2021-07-01 ENCOUNTER — Telehealth: Payer: Self-pay

## 2021-07-01 NOTE — Telephone Encounter (Signed)
Message from Dr. Dwaine Gale sent to Dr. Theora Gianotti. ? ? ?----- Message -----  ?From: Mir, Paula Libra, MD  ?Sent: 06/27/2021  10:25 AM EDT  ?To: Danae Orleans  ?Subject: RE: ATTN DR MIR    CT Biopsy                  ? ?I would not recommend biopsy at this time.  ? ?I believe this nodule has been present as far as back 02/09/2018 Lumbar spine MRI.  ? ?If further workup is needed, adrenal protocol MRI can be performed.  ? ?Patient should also be worked up for pheochromocytoma through laboratory workup.  ? ?Mir  ? ?  ?

## 2021-07-03 ENCOUNTER — Telehealth: Payer: Self-pay

## 2021-07-03 ENCOUNTER — Other Ambulatory Visit: Payer: Self-pay | Admitting: Internal Medicine

## 2021-07-03 ENCOUNTER — Other Ambulatory Visit: Payer: Self-pay | Admitting: Oncology

## 2021-07-03 ENCOUNTER — Telehealth: Payer: Self-pay | Admitting: *Deleted

## 2021-07-03 DIAGNOSIS — E278 Other specified disorders of adrenal gland: Secondary | ICD-10-CM

## 2021-07-03 NOTE — Telephone Encounter (Signed)
Patient called asking if she is now considered in remission. Please return her call ?

## 2021-07-03 NOTE — Telephone Encounter (Signed)
per secure chat from Dr. Tasia Catchings:  Valerie Case, let her know that these testing (blood/ urine testing) is for work up of the spot on her adrenal gland.  ? ?Pt informed of plan and verbalized understanding. Scheduled for labwork on Friday 4/21.  ?

## 2021-07-03 NOTE — Telephone Encounter (Signed)
Call returned to patient, per Dr. Baruch Gouty she can consider herself in remission.   Patient was appreciative of return call.   ?

## 2021-07-05 ENCOUNTER — Encounter: Payer: Self-pay | Admitting: Nurse Practitioner

## 2021-07-05 ENCOUNTER — Inpatient Hospital Stay: Payer: Medicare PPO

## 2021-07-05 DIAGNOSIS — C541 Malignant neoplasm of endometrium: Secondary | ICD-10-CM | POA: Diagnosis not present

## 2021-07-05 DIAGNOSIS — E278 Other specified disorders of adrenal gland: Secondary | ICD-10-CM

## 2021-07-05 NOTE — Progress Notes (Signed)
Reached out to radiology to evaluate for possible ventral hernia. MD is out of office. Message left.  ?

## 2021-07-07 DIAGNOSIS — C541 Malignant neoplasm of endometrium: Secondary | ICD-10-CM | POA: Diagnosis not present

## 2021-07-08 ENCOUNTER — Other Ambulatory Visit: Payer: Self-pay

## 2021-07-08 DIAGNOSIS — E278 Other specified disorders of adrenal gland: Secondary | ICD-10-CM

## 2021-07-09 LAB — METANEPHRINES, PLASMA
Metanephrine, Free: 29.8 pg/mL (ref 0.0–88.0)
Normetanephrine, Free: 100 pg/mL (ref 0.0–285.2)

## 2021-07-10 ENCOUNTER — Other Ambulatory Visit: Payer: Self-pay

## 2021-07-10 DIAGNOSIS — E278 Other specified disorders of adrenal gland: Secondary | ICD-10-CM

## 2021-07-10 DIAGNOSIS — C541 Malignant neoplasm of endometrium: Secondary | ICD-10-CM | POA: Diagnosis not present

## 2021-07-10 LAB — CATECHOLAMINES,UR.,FREE,24 HR
Dopamine, Rand Ur: 116 ug/L
Dopamine, Ur, 24Hr: 131 ug/24 hr (ref 0–510)
Epinephrine, Rand Ur: <1 ug/L
Epinephrine, U, 24Hr: <1 ug/24 hr (ref 0–20)
Norepinephrine, Rand Ur: 19 ug/L
Norepinephrine,U,24H: 21 ug/24 hr (ref 0–135)
Total Volume: 1125

## 2021-07-15 LAB — METANEPHRINES, URINE, 24 HOUR
Metaneph Total, Ur: 29 ug/L
Metanephrines, 24H Ur: 29 ug/24 hr — ABNORMAL LOW (ref 36–209)
Normetanephrine, 24H Ur: 146 ug/24 hr (ref 131–612)
Normetanephrine, Ur: 146 ug/L
Total Volume: 1000

## 2021-07-17 ENCOUNTER — Telehealth: Payer: Self-pay

## 2021-07-17 DIAGNOSIS — E278 Other specified disorders of adrenal gland: Secondary | ICD-10-CM

## 2021-07-17 NOTE — Telephone Encounter (Signed)
Work up for  pheochromocytoma was negative per Dr. Tasia Catchings. Dr. Tasia Catchings has spoken to Dr. Theora Gianotti regarding adrenal mass and requested referral to urology. Called and updated Valerie Case on the plan. She has seen Dr. Bernardo Heater in the past and would like to see him back. New referral has been sent. Will add to 5/10 tumor board for gyn oncology. Radiology review requested. ?

## 2021-07-19 ENCOUNTER — Ambulatory Visit: Payer: Medicare PPO | Admitting: Urology

## 2021-07-24 ENCOUNTER — Other Ambulatory Visit: Payer: Medicare PPO

## 2021-07-24 ENCOUNTER — Telehealth: Payer: Self-pay

## 2021-07-24 DIAGNOSIS — C541 Malignant neoplasm of endometrium: Secondary | ICD-10-CM

## 2021-07-24 NOTE — Telephone Encounter (Signed)
Called and reviewed tumor board recommendations. Appointment made to follow up with gyn oncology for recommendations. Inquired about appointment with urology. Valerie Case was not able to make the first offered appointment and was awaiting a call back with another date. Contacted urology to reach out and offer another date. ?

## 2021-07-24 NOTE — Progress Notes (Signed)
Tumor Board Documentation ? ?Valerie Case was presented by Dr. Tasia Catchings at our Tumor Board on 07/24/2021, which included representatives from medical oncology, radiation oncology, surgical oncology, pathology, navigation, genetics, palliative care. ? ?Valerie Case currently presents as a current patient, for new tumor(s) (adrenal mass) with history of the following treatments:  (palliative radiation for bleeding). ? ?Additionally, we reviewed previous medical and familial history, history of present illness, and recent lab results along with all available histopathologic and imaging studies. The tumor board considered available treatment options and made the following recommendations: ? (recommend working up adrenal mass with endocrine and urology. May recommend adjuvant chemo and/or radiation based on results.) ?  ? ?The following procedures/referrals were also placed: No orders of the defined types were placed in this encounter. ? ? ?Clinical Trial Status:    ? ?Staging used:   ? ?National site-specific guidelines   were discussed with respect to the case. ? ?Tumor board is a meeting of clinicians from various specialty areas who evaluate and discuss patients for whom a multidisciplinary approach is being considered. Final determinations in the plan of care are those of the provider(s). The responsibility for follow up of recommendations given during tumor board is that of the provider.  ? ?Today?s extended care, comprehensive team conference, Valerie Case was not present for the discussion and was not examined.  ? ?

## 2021-07-26 ENCOUNTER — Other Ambulatory Visit: Payer: Self-pay | Admitting: Internal Medicine

## 2021-07-26 DIAGNOSIS — N644 Mastodynia: Secondary | ICD-10-CM

## 2021-07-29 ENCOUNTER — Encounter: Payer: Self-pay | Admitting: Urology

## 2021-07-29 ENCOUNTER — Ambulatory Visit: Payer: Medicare PPO | Admitting: Urology

## 2021-07-29 VITALS — BP 163/88 | HR 94 | Ht 68.0 in | Wt 282.0 lb

## 2021-07-29 DIAGNOSIS — E278 Other specified disorders of adrenal gland: Secondary | ICD-10-CM | POA: Diagnosis not present

## 2021-07-29 NOTE — Progress Notes (Signed)
? ?07/29/2021 ?12:37 PM  ? ?Valerie Case ?09/01/1950 ?[id number] ? ?Referring provider: Earlie Server, MD ?[place] [address] ? ?Chief Complaint  ?Patient presents with  ? Follow-up  ? ? ?HPI: ?71 y.o. female presents for follow-up. ? ?Initially referred 11/2020 for recurrent UTI ?CT abdomen pelvis showed a 2.6 cm left adrenal mass ?Cystoscopy showed no bladder mucosal abnormalities ?CT is subsequently performed which showed no upper tract abnormalities ?According to patient adrenal mass also noted on abdominal imaging in 2019 although I do not see in Epic ?Subsequently diagnosed with grade 3 endometrial carcinoma and underwent external beam radiotherapy ?PET scan with large necrotic uterine mass.  Left adrenal lesion was hypermetabolic and felt worrisome for metastatic disease ?She has seen Dr. Theora Gianotti who has recommended and ordered CT-guided biopsy of the adrenal mass.  This has not yet been scheduled ?Was referred back here for adrenal mass evaluation ?Denies recurrent gross hematuria ? ? ?PMH: ?Past Medical History:  ?Diagnosis Date  ? Asthma   ? Depression 04/01/2021  ? Diabetes mellitus without complication ([place])   ? Essential hypertension 04/01/2021  ? Hx of dysplastic nevus 07/16/2010  ? RLQA  ? Hypertension   ? Insulin dependent type 2 diabetes mellitus ([place]) 04/01/2021  ? ? ?Surgical History: ?Past Surgical History:  ?Procedure Laterality Date  ? CESAREAN SECTION  01/23/1979  ? TONSILLECTOMY  03/17/1956  ? ? ?Home Medications:  ?Allergies as of 07/29/2021   ? ?   Reactions  ? Aspirin Rash  ? Childhood reaction   ? ?  ? ?  ?Medication List  ?  ? ?  ? Accurate as of Jul 29, 2021 12:37 PM. If you have any questions, ask your nurse or doctor.  ?  ?  ? ?  ? ?acetaminophen 650 MG CR tablet ?Commonly known as: TYLENOL ?Take 1,300 mg by mouth every 8 (eight) hours as needed for pain. ?  ?albuterol 108 (90 Base) MCG/ACT inhaler ?Commonly known as: VENTOLIN HFA ?Inhale 2 puffs into the lungs  every 6 (six) hours as needed for wheezing or shortness of breath. ?  ?empagliflozin 25 MG Tabs tablet ?Commonly known as: JARDIANCE ?Take 25 mg by mouth daily. ?  ?ferrous sulfate 325 (65 FE) MG EC tablet ?Take 325 mg by mouth daily with breakfast. ?  ?FLUoxetine 10 MG capsule ?Commonly known as: PROZAC ?Take 10 mg by mouth daily. ?  ?fluticasone 50 MCG/ACT nasal spray ?Commonly known as: FLONASE ?Place 1 spray into both nostrils daily as needed for allergies or rhinitis. ?  ?hydrochlorothiazide 12.5 MG tablet ?Commonly known as: HYDRODIURIL ?Take 12.5 mg by mouth daily. ?  ?ibuprofen 200 MG tablet ?Commonly known as: ADVIL ?Take 400 mg by mouth every 8 (eight) hours as needed for moderate pain. ?  ?lisinopril 40 MG tablet ?Commonly known as: ZESTRIL ?Take 40 mg by mouth at bedtime. ?  ?metFORMIN 500 MG 24 hr tablet ?Commonly known as: GLUCOPHAGE-XR ?Take 1,000 mg by mouth at bedtime. ?  ?metoprolol succinate 25 MG 24 hr tablet ?Commonly known as: TOPROL-XL ?Take 25 mg by mouth daily. ?  ?NovoLIN 70/30 Kwikpen (70-30) 100 UNIT/ML KwikPen ?Generic drug: insulin isophane & regular human KwikPen ?50-110 Units See admin instructions. 50 units in the morning, 110 units at bedtime ?  ?Ozempic (1 MG/DOSE) 4 MG/3ML Sopn ?Generic drug: Semaglutide (1 MG/DOSE) ?Inject 1 mg into the skin every Tuesday. ?  ?rosuvastatin 10 MG tablet ?Commonly known as: CRESTOR ?Take 10 mg by mouth  daily. ?  ? ?  ? ? ?Allergies:  ?Allergies  ?Allergen Reactions  ? Aspirin Rash  ?  Childhood reaction   ? ? ?Family History: ?Family History  ?Problem Relation Age of Onset  ? Breast cancer Mother 38  ? Cancer Mother   ? Cancer Father   ? Lung cancer Father   ? Breast cancer Cousin   ?     maternal side  ? ? ?Social History:  reports that she has never smoked. She has never used smokeless tobacco. She reports that she does not currently use alcohol. She reports that she does not use drugs. ? ? ?Physical Exam: ?BP (!) 163/88   Pulse 94   Ht '5\' 8"'$   (1.727 m)   Wt 282 lb (127.9 kg)   BMI 42.88 kg/m?   ?Constitutional:  Alert and oriented, No acute distress. ?Psychiatric: Normal mood and affect. ? ? ?Assessment & Plan:   ? ?1.  Adrenal mass ?Agree with CT-guided biopsy to evaluate for metastatic disease.  This has been ordered but not scheduled ?If biopsy negative recommend endocrinology evaluation ?If the adrenal mass is nonfunctioning surveillance would be recommended ? ? ?Abbie Sons, MD ? ?[facility] ?[address] ?[place], [place] [address] ?[phone] ?

## 2021-07-31 ENCOUNTER — Other Ambulatory Visit: Payer: Self-pay

## 2021-07-31 ENCOUNTER — Inpatient Hospital Stay (HOSPITAL_BASED_OUTPATIENT_CLINIC_OR_DEPARTMENT_OTHER): Payer: Medicare PPO | Admitting: Oncology

## 2021-07-31 ENCOUNTER — Inpatient Hospital Stay: Payer: Medicare PPO

## 2021-07-31 ENCOUNTER — Encounter: Payer: Self-pay | Admitting: Obstetrics and Gynecology

## 2021-07-31 ENCOUNTER — Inpatient Hospital Stay: Payer: Medicare PPO | Attending: Obstetrics and Gynecology | Admitting: Obstetrics and Gynecology

## 2021-07-31 ENCOUNTER — Encounter: Payer: Self-pay | Admitting: Oncology

## 2021-07-31 VITALS — BP 145/71 | HR 100 | Temp 98.7°F | Resp 19 | Wt 281.0 lb

## 2021-07-31 DIAGNOSIS — I129 Hypertensive chronic kidney disease with stage 1 through stage 4 chronic kidney disease, or unspecified chronic kidney disease: Secondary | ICD-10-CM | POA: Insufficient documentation

## 2021-07-31 DIAGNOSIS — R160 Hepatomegaly, not elsewhere classified: Secondary | ICD-10-CM | POA: Diagnosis not present

## 2021-07-31 DIAGNOSIS — E278 Other specified disorders of adrenal gland: Secondary | ICD-10-CM

## 2021-07-31 DIAGNOSIS — C541 Malignant neoplasm of endometrium: Secondary | ICD-10-CM

## 2021-07-31 DIAGNOSIS — I872 Venous insufficiency (chronic) (peripheral): Secondary | ICD-10-CM | POA: Insufficient documentation

## 2021-07-31 DIAGNOSIS — E119 Type 2 diabetes mellitus without complications: Secondary | ICD-10-CM | POA: Insufficient documentation

## 2021-07-31 DIAGNOSIS — E538 Deficiency of other specified B group vitamins: Secondary | ICD-10-CM | POA: Insufficient documentation

## 2021-07-31 DIAGNOSIS — F32A Depression, unspecified: Secondary | ICD-10-CM | POA: Insufficient documentation

## 2021-07-31 DIAGNOSIS — R600 Localized edema: Secondary | ICD-10-CM | POA: Insufficient documentation

## 2021-07-31 DIAGNOSIS — D500 Iron deficiency anemia secondary to blood loss (chronic): Secondary | ICD-10-CM | POA: Insufficient documentation

## 2021-07-31 DIAGNOSIS — Z7984 Long term (current) use of oral hypoglycemic drugs: Secondary | ICD-10-CM | POA: Insufficient documentation

## 2021-07-31 DIAGNOSIS — Z923 Personal history of irradiation: Secondary | ICD-10-CM | POA: Insufficient documentation

## 2021-07-31 DIAGNOSIS — Z79899 Other long term (current) drug therapy: Secondary | ICD-10-CM | POA: Insufficient documentation

## 2021-07-31 DIAGNOSIS — D649 Anemia, unspecified: Secondary | ICD-10-CM

## 2021-07-31 DIAGNOSIS — N189 Chronic kidney disease, unspecified: Secondary | ICD-10-CM | POA: Insufficient documentation

## 2021-07-31 DIAGNOSIS — E279 Disorder of adrenal gland, unspecified: Secondary | ICD-10-CM | POA: Insufficient documentation

## 2021-07-31 DIAGNOSIS — Z7189 Other specified counseling: Secondary | ICD-10-CM

## 2021-07-31 LAB — CBC WITH DIFFERENTIAL/PLATELET
Abs Immature Granulocytes: 0.05 10*3/uL (ref 0.00–0.07)
Basophils Absolute: 0 10*3/uL (ref 0.0–0.1)
Basophils Relative: 1 %
Eosinophils Absolute: 0.3 10*3/uL (ref 0.0–0.5)
Eosinophils Relative: 5 %
HCT: 37.7 % (ref 36.0–46.0)
Hemoglobin: 12 g/dL (ref 12.0–15.0)
Immature Granulocytes: 1 %
Lymphocytes Relative: 12 %
Lymphs Abs: 0.9 10*3/uL (ref 0.7–4.0)
MCH: 27.9 pg (ref 26.0–34.0)
MCHC: 31.8 g/dL (ref 30.0–36.0)
MCV: 87.7 fL (ref 80.0–100.0)
Monocytes Absolute: 0.8 10*3/uL (ref 0.1–1.0)
Monocytes Relative: 11 %
Neutro Abs: 5.1 10*3/uL (ref 1.7–7.7)
Neutrophils Relative %: 70 %
Platelets: 305 10*3/uL (ref 150–400)
RBC: 4.3 MIL/uL (ref 3.87–5.11)
RDW: 15.7 % — ABNORMAL HIGH (ref 11.5–15.5)
WBC: 7.2 10*3/uL (ref 4.0–10.5)
nRBC: 0 % (ref 0.0–0.2)

## 2021-07-31 LAB — COMPREHENSIVE METABOLIC PANEL
ALT: 16 U/L (ref 0–44)
AST: 21 U/L (ref 15–41)
Albumin: 3.9 g/dL (ref 3.5–5.0)
Alkaline Phosphatase: 67 U/L (ref 38–126)
Anion gap: 9 (ref 5–15)
BUN: 24 mg/dL — ABNORMAL HIGH (ref 8–23)
CO2: 27 mmol/L (ref 22–32)
Calcium: 10.6 mg/dL — ABNORMAL HIGH (ref 8.9–10.3)
Chloride: 103 mmol/L (ref 98–111)
Creatinine, Ser: 0.98 mg/dL (ref 0.44–1.00)
GFR, Estimated: >60 mL/min (ref >60)
Glucose, Bld: 151 mg/dL — ABNORMAL HIGH (ref 70–99)
Potassium: 4.2 mmol/L (ref 3.5–5.1)
Sodium: 139 mmol/L (ref 135–145)
Total Bilirubin: 0.5 mg/dL (ref 0.3–1.2)
Total Protein: 7.8 g/dL (ref 6.5–8.1)

## 2021-07-31 LAB — IRON AND TIBC
Iron: 53 ug/dL (ref 28–170)
Saturation Ratios: 15 % (ref 10.4–31.8)
TIBC: 357 ug/dL (ref 250–450)
UIBC: 304 ug/dL

## 2021-07-31 LAB — FERRITIN: Ferritin: 44 ng/mL (ref 11–307)

## 2021-07-31 MED ORDER — ONDANSETRON HCL 8 MG PO TABS: 8.0000 mg | ORAL_TABLET | Freq: Two times a day (BID) | ORAL | 1 refills | Status: DC | PRN

## 2021-07-31 MED ORDER — LIDOCAINE-PRILOCAINE 2.5-2.5 % EX CREA: TOPICAL_CREAM | CUTANEOUS | 3 refills | Status: DC

## 2021-07-31 MED ORDER — PROCHLORPERAZINE MALEATE 10 MG PO TABS: 10.0000 mg | ORAL_TABLET | Freq: Four times a day (QID) | ORAL | 1 refills | Status: DC | PRN

## 2021-07-31 NOTE — Progress Notes (Signed)
Patient on plan of care prior to pathways. 

## 2021-07-31 NOTE — Progress Notes (Signed)
?Hematology/Oncology Progress note ?[phone] [phone] ?  ? ?   ? ? ?Patient Care Team: ?Tracie Harrier, MD as PCP - General (Internal Medicine) ? ?REFERRING PROVIDER: ?Tracie Harrier, MD Dr. Fransisca Connors ?CHIEF COMPLAINTS/REASON FOR VISIT:  ?anemia and endometrial cancer ? ?HISTORY OF PRESENTING ILLNESS:  ? ?Valerie Case is a  71 y.o.  female with PMH listed below was seen in consultation at the request of  Tracie Harrier, MD  for evaluation of anemia.  ? 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 ?04/01/2021 - 04/03/2021, patient was hospitalized due to symptomatic anemia, hemoglobin 6.9, heavy postmenopausal bleeding with passing large clots.  She also presented with increased creatinine level to 2.58 compared to her baseline level of 0.9 in November 2022.  Patient received IV iron infusion and 2 units of PRBC during the hospital stay..  04/02/2021, iron panel showed iron saturation 37, ferritin 37, TIBC 333-the studies were Case after patient received blood transfusion.  At discharge, hemoglobin was 7.7. ?04/01/2020, endometrial biopsy showed poorly differentiated endometrial adenocarcinoma. ? ?#Last year, patient has experienced hematuria in September 2022. ?12/12/2020, CT abdomen pelvis with contrast showed no radiographic evidence of urinary tract neoplasm, calculi, hydronephrosis.  2.6 cm nonspecific left adrenal mass. ?Hematuria persisted and 02/12/2021 CT hematuria work-up showed small uterine fibroids, no findings to explain hematuria. ?Hepatomegaly, diverticulosis without evidence of diverticulitis, Coronary artery disease ? ?04/09/2021, vitamin B12 136. ?04/10/2021, patient was seen by Dr. Fransisca Connors, patient continues to have vaginal bleeding, passing clots.  CBC was checked on 04/10/2021, hemoglobin was 8.2.  MCV 87 0.7.  Platelet count 406, white count of 10.3.  Patient received 1 unit of PRBC transfusion on  04/11/2020 prior to establish care with me on 04/11/2020 ?She was accompanied by her daughter. ? ?04/15/21 PET showed  ?1. Large hypermetabolic endometrial mass consistent with known endometrial cancer. Suspect direct invasion/involvement of the right adnexa. Right pelvic sidewall hypermetabolic adenopathy.2. 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 ?3. No findings for metastatic disease involving the chest or bonystructures. ? ? ?INTERVAL HISTORY ?Valerie Case is a 71 y.o. female who has above history reviewed by me today presents for follow up visit for anemia and endometrial cancer ?Patient finished palliative radiation.  Vaginal bleeding has stopped. ?Right renal mass with uncertain etiology.  FDG avid on PET scan, patient has had negative biochemical testing for pheochromocytoma.  Patient denies family history of pheochromocytoma or clinical history of resistant hypertension. ?Patient was seen by urology and recommended for biopsy.  IR recommended renal MRI. ? ?Patient was accompanied by her daughter today to review management plan of endometrial cancer. ?Patient has been seen by gynecology oncology Dr. Theora Gianotti who reviewed options which include surgery versus chemotherapy.  Patient is interested in aggressive adjuvant chemotherapy first.  Patient has multiple chemoprotectants including hypertension, diabetes, morbid obesity, chronic kidney insufficiency, Dr. Theora Gianotti recommended chemotherapy with 3 cycles of carboplatin/Taxol followed by reimaging, gynecology oncology reevaluation with consideration for 3 more cycles if no evidence of progression. ? ?Patient and daughter present for further discussion of chemotherapy plan.   ? ?Review of Systems  ?Constitutional:  Positive for fatigue. Negative for chills and fever.  ?HENT:   Negative for hearing loss and voice change.   ?Eyes:  Negative for eye problems.   ?Respiratory:  Negative for chest tightness and cough.   ?Cardiovascular:  Negative for chest pain.  ?Gastrointestinal:  Negative for abdominal distention, abdominal pain and blood in stool.  ?Endocrine: Negative for hot flashes.  ?Genitourinary:  Negative for difficulty urinating, frequency and vaginal bleeding.   ?Musculoskeletal:  Negative for arthralgias.  ?Skin:  Negative for itching and rash.  ?Neurological:  Negative for extremity weakness.  ?Hematological:  Negative for adenopathy.  ?Psychiatric/Behavioral:  Negative for confusion.   ? ?MEDICAL HISTORY:  ?Past Medical History:  ?Diagnosis Date  ? Asthma   ? Depression 04/01/2021  ? Diabetes mellitus without complication ([place])   ? Essential hypertension 04/01/2021  ? Hx of dysplastic nevus 07/16/2010  ? RLQA  ? Hypertension   ? 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 ? ?SURGICAL HISTORY: ?Past Surgical History:  ?Procedure Laterality Date  ? CESAREAN SECTION  01/23/1979  ? TONSILLECTOMY  03/17/1956  ? ? ?SOCIAL HISTORY: ?Social History  ? ?Socioeconomic History  ? Marital status: Divorced  ?  Spouse name: Not on file  ? Number of children: Not on file  ? Years of education: Not on file  ? Highest education level: Not on file  ?Occupational History  ? Not on file  ?Tobacco Use  ? Smoking status: Never  ? Smokeless tobacco: Never  ?Vaping Use  ? Vaping Use: Never used  ?Substance and Sexual Activity  ? Alcohol use: Not Currently  ? Drug use: Never  ? Sexual activity: Not Currently  ?Other Topics Concern  ? Not on file  ?Social History Narrative  ?   ? ?Social Determinants of Health  ? ?Financial Resource Strain: Not on file  ?Food Insecurity: Not on file  ?Transportation Needs: Not on file  ?Physical Activity: Not on file  ?Stress: Not on file  ?Social Connections: Not on file  ?Intimate Partner Violence: Not on file  ? ? ?FAMILY HISTORY: ?Family History  ?Problem Relation Age of Onset  ? Breast cancer Mother 79  ? Cancer Mother   ? Cancer  Father   ? Lung cancer Father   ? Breast cancer Cousin   ?     maternal side  ? ? ?ALLERGIES:  is allergic to aspirin. ? ?MEDICATIONS:  ?Current Outpatient Medications  ?Medication Sig Dispense Refill  ? acetaminophen (TYLENOL) 650 MG CR tablet Take 1,300 mg by mouth every 8 (eight) hours as needed for pain.    ? albuterol (VENTOLIN HFA) 108 (90 Base) MCG/ACT inhaler Inhale 2 puffs into the lungs every 6 (six) hours as needed for wheezing or shortness of breath.    ? empagliflozin (JARDIANCE) 25 MG TABS tablet Take 25 mg by mouth daily.    ? ferrous sulfate 325 (65 FE) MG EC tablet Take 325 mg by mouth daily with breakfast.    ? FLUoxetine (PROZAC) 10 MG capsule Take 10 mg by mouth daily.    ? fluticasone (FLONASE) 50 MCG/ACT nasal spray Place 1 spray into both nostrils daily as needed for allergies or rhinitis.    ? hydrochlorothiazide (HYDRODIURIL) 12.5 MG tablet Take 12.5 mg by mouth daily.    ? ibuprofen (ADVIL) 200 MG tablet Take 400 mg by mouth every 8 (eight) hours as needed for moderate pain.    ? lisinopril (ZESTRIL) 40 MG tablet Take 40 mg by mouth at bedtime. (Patient not taking: Reported on 07/31/2021)    ? metFORMIN (GLUCOPHAGE-XR) 500 MG 24 hr tablet Take 1,000 mg by mouth at bedtime.    ? metoprolol succinate (TOPROL-XL) 25 MG 24 hr tablet Take 25 mg by mouth daily.    ?  NOVOLIN 70/30 FLEXPEN (70-30) 100 UNIT/ML KwikPen 50-110 Units See admin instructions. 50 units in the morning, 110 units at bedtime    ? OZEMPIC, 1 MG/DOSE, 4 MG/3ML SOPN Inject 1 mg into the skin every Tuesday.    ? rosuvastatin (CRESTOR) 10 MG tablet Take 10 mg by mouth daily.    ? ?No current facility-administered medications for this visit.  ? ? ? ?PHYSICAL EXAMINATION: ?ECOG PERFORMANCE STATUS: 1 - Symptomatic but completely ambulatory ? ?Physical Exam ?Constitutional:   ?   General: She is not in acute distress. ?   Appearance: She is obese.  ?HENT:  ?   Head: Normocephalic and atraumatic.  ?Eyes:  ?   General: No scleral  icterus. ?Cardiovascular:  ?   Rate and Rhythm: Normal rate and regular rhythm.  ?   Heart sounds: Normal heart sounds.  ?Pulmonary:  ?   Effort: Pulmonary effort is normal. No respiratory distress.  ?   Breath so

## 2021-07-31 NOTE — Patient Instructions (Signed)
Risks of surgery include infection, anesthesia, bleeding, transfusion, wound separation, vaginal cuff dehiscence, medical issues (blood clots, stroke, heart attack, fluid in the lungs, pneumonia, abnormal heart rhythm, death), possible exploratory surgery with larger incision, lymphedema, lymphocyst, allergic reaction, injury to adjacent organs (bowel, bladder, blood vessels, nerves, ureters, uterus)   ?

## 2021-07-31 NOTE — Progress Notes (Signed)
Gynecologic Oncology Interval Visit  ? ?Referring Provider: Dr. Glennon Mac ? ?Chief Complaint: FIGO grade III endometrial adenocarcinoma ? ?Subjective:  ?Valerie Case is a 71 y.o. female who is seen in consultation from Dr. Glennon Mac for poorly differentiated endometrial adenocarcinoma, figo grade III, s/p radiation x 5 weeks ? ?She presents for follow up and discussion of management options.  ? ?PET scan had showed substantial reduction in size of mass and right pelvic lymph node. Hypermetabolic left adrenal mass has been worked up for possible pheochromocytoma which was negative. She was referred to urology and saw Dr. Bernardo Heater who recommended CT guided biopsy but this was refused by IR.  ? ?She has been followed by Dr. Tasia Catchings for iron deficiency anemia secondary to blood loss in setting of endometrial cancer. She received IV iron and palliative radiation. Bleeding has now stopped and hemoglobin has improved to 11.4. Blood sugars have been elevated and last HmgA1c was 7.9 (07/18/21) ? ?Her case was discussed at Tumor Board. Dr. Fransisca Connors felt like she would be a candidate for surgery.  ?She presents today to review all her options. She came with her daughter and came into the [facility] using a cane; and then uses a wheelchair to come up to clinic.  ? ? ? ?Gynecologic Oncology History ?Valerie Case is a pleasant female who is seen in consultation from Dr. Glennon Mac for poorly differentiated endometrial adenocarcinoma, figo grade III. Please see prior notes for complete detail ? ?04/01/21 She was admitted  to [facility] with vaginal bleeding and received 2 units pRBCs and iron infusion. She underwent endometrial biopsy on which revealed poorly differentiated endometrial adenocarcinoma FIGO III ? ?04/02/2021 BILATERAL LOWER EXTREMITY VENOUS DOPPLER ULTRASOUND ?No evidence of deep venous thrombosis in either lower extremity. ? ?04/16/2021 PET ?IMPRESSION: ?1. Large hypermetabolic endometrial mass consistent with known  endometrial cancer. Suspect direct invasion/involvement of the right adnexa. Right pelvic sidewall hypermetabolic adenopathy. ?2. 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 ?3. No findings for metastatic disease involving the chest or bony structures. ? ?04/24/2021 Tumor Board Documentation ?Patient to undergo radiation therapy. Plan to reassess after radiation with imaging to re-evaluate possible surgical intervention.   ?  ?04/24/2021 - 05/29/2021: She completed WPRT Dose/Fx (Gy): 1.8 #Fx: 25 / 25. Total Dose (Gy): 45 ? ?06/12/2021 PET ?IMPRESSION: ?1. Substantial reduction in size and activity of the uterine mass. Substantial reduction in size and activity of the right pelvic sidewall lymph node. ?2. The moderately hypermetabolic left adrenal mass is stable in size back through earliest available compare comparison of 12/12/2020, with roughly similar activity level to previous. The density characteristics are not specific for adrenal adenoma. Possibilities include benign and malignant adrenal neoplasms versus metastatic lesion. ?3. Other imaging findings of potential clinical significance: Left mastoid effusion. Coronary and aortic atherosclerosis. Systemic atherosclerosis. Mild cardiomegaly. Lax anterior abdominal wall. Suspected cholelithiasis. Sigmoid colon diverticulosis. Congenital anomalies of the C1 vertebra. Anterolisthesis at L4-5. ? ? ?Problem List: ?Patient Active Problem List  ? Diagnosis Date Noted  ? Venous insufficiency of both lower extremities 05/20/2021  ? Hepatomegaly 04/14/2021  ? Endometrial adenocarcinoma ([place]) 04/14/2021  ? Adrenal mass ([place]) 04/14/2021  ? Goals of care, counseling/discussion 04/14/2021  ? Cellulitis of right lower extremity 04/14/2021  ? B12 deficiency 04/11/2021  ? Normocytic anemia 04/11/2021  ? Symptomatic anemia 04/01/2021  ? AKI (acute kidney injury) ([place]) 04/01/2021  ?  Essential hypertension 04/01/2021  ? Insulin dependent type  2 diabetes mellitus ([place]) 04/01/2021  ? Depression 04/01/2021  ? Recent unintentional weight loss over several months 04/01/2021  ? History of asthma 04/01/2021  ? Thrush, oral 04/01/2021  ? Bilateral lower extremity edema 04/01/2021  ? ? ? ?Past Medical History: ?Past Medical History:  ?Diagnosis Date  ? Asthma   ? Depression 04/01/2021  ? Diabetes mellitus without complication ([place])   ? Essential hypertension 04/01/2021  ? Hx of dysplastic nevus 07/16/2010  ? RLQA  ? Hypertension   ? 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 ? ?Past Surgical History: ?Past Surgical History:  ?Procedure Laterality Date  ? CESAREAN SECTION  01/23/1979  ? TONSILLECTOMY  03/17/1956  ? ? ?Past Gynecologic History:  ?Post menopausal - 2006 ?STD: denies ? ? ?OB History:  ?OB History  ?Gravida Para Term Preterm AB Living  ?'1 1 1     1  '$ ?SAB IAB Ectopic Multiple Live Births  ?        1  ?  ?# Outcome Date GA Lbr Len/2nd Weight Sex Delivery Anes PTL Lv  ?1 Term 1980     CS-Unspec   LIV  ? ? ?Family History: ?Family History  ?Problem Relation Age of Onset  ? Breast cancer Mother 80  ? Cancer Mother   ? Cancer Father   ? Lung cancer Father   ? Breast cancer Cousin   ?     maternal side  ? ? ?Social History: ?Social History  ? ?Socioeconomic History  ? Marital status: Divorced  ?  Spouse name: Not on file  ? Number of children: Not on file  ? Years of education: Not on file  ? Highest education level: Not on file  ?Occupational History  ? Not on file  ?Tobacco Use  ? Smoking status: Never  ? Smokeless tobacco: Never  ?Vaping Use  ? Vaping Use: Never used  ?Substance and Sexual Activity  ? Alcohol use: Not Currently  ? Drug use: Never  ? Sexual activity: Not Currently  ?Other Topics Concern  ? Not on file  ?Social History Narrative  ?   ? ?Social Determinants of Health  ? ?Financial Resource Strain: Not on file  ?Food Insecurity: Not on file  ?Transportation Needs:  Not on file  ?Physical Activity: Not on file  ?Stress: Not on file  ?Social Connections: Not on file  ?Intimate Partner Violence: Not on file  ? ? ?Allergies: ?Allergies  ?Allergen Reactions  ? Aspirin Rash  ?  Childhood reaction   ? ? ?Current Medications: ?Current Outpatient Medications  ?Medication Sig Dispense Refill  ? acetaminophen (TYLENOL) 650 MG CR tablet Take 1,300 mg by mouth every 8 (eight) hours as needed for pain.    ? albuterol (VENTOLIN HFA) 108 (90 Base) MCG/ACT inhaler Inhale 2 puffs into the lungs every 6 (six) hours as needed for wheezing or shortness of breath.    ? empagliflozin (JARDIANCE) 25 MG TABS tablet Take 25 mg by mouth daily.    ? ferrous sulfate 325 (65 FE) MG EC tablet Take 325 mg by mouth daily with breakfast.    ? FLUoxetine (PROZAC) 10 MG capsule Take 10 mg by mouth daily.    ? fluticasone (FLONASE) 50 MCG/ACT nasal spray Place 1 spray into both nostrils daily as needed for allergies or rhinitis.    ? hydrochlorothiazide (HYDRODIURIL) 12.5 MG tablet Take 12.5 mg by mouth daily.    ? ibuprofen (ADVIL) 200 MG tablet Take 400 mg by mouth  every 8 (eight) hours as needed for moderate pain.    ? metFORMIN (GLUCOPHAGE-XR) 500 MG 24 hr tablet Take 1,000 mg by mouth at bedtime.    ? metoprolol succinate (TOPROL-XL) 25 MG 24 hr tablet Take 25 mg by mouth daily.    ? NOVOLIN 70/30 FLEXPEN (70-30) 100 UNIT/ML KwikPen 50-110 Units See admin instructions. 50 units in the morning, 110 units at bedtime    ? OZEMPIC, 1 MG/DOSE, 4 MG/3ML SOPN Inject 1 mg into the skin every Tuesday.    ? rosuvastatin (CRESTOR) 10 MG tablet Take 10 mg by mouth daily.    ? lisinopril (ZESTRIL) 40 MG tablet Take 40 mg by mouth at bedtime. (Patient not taking: Reported on 07/31/2021)    ? ?No current facility-administered medications for this visit.  ? ? ?Review of Systems ?General:  fatigue/weakness o/w no complaints ?Skin: no complaints ?Eyes: no complaints ?HEENT: no complaints ?Breasts: no complaints ?Pulmonary:  shortness of breath and cough o/w no complaints ?Cardiac: leg swelling o/w no complaints ?Gastrointestinal: no complaints ?Genitourinary/Sexual: no complaints ?Ob/Gyn: no complaints ?Musculoskeletal: back pain o/w no c

## 2021-07-31 NOTE — Progress Notes (Signed)
Patient on schedule for port placement 5/26, called and spoke with patient on phone with pre procedure instructions given, made aware to be here at 0900, NPO after MN prior to procedure as well as [employer] operational [employer]. Stated understanding. Will hold am dose of insulin and oral diabetic day of procedure as well as half dose of insulin hs prior to procedure. ?

## 2021-07-31 NOTE — Progress Notes (Signed)
START ON PATHWAY REGIMEN - Uterine ? ? ?  A cycle is every 21 days: ?    Paclitaxel  ?    Carboplatin  ? ?**Always confirm dose/schedule in your pharmacy ordering system** ? ?Patient Characteristics: ?Endometrioid, Newly Diagnosed (Clinical Staging), Nonsurgical Candidate, Stage III-IV ?Histology: Endometrioid ?Therapeutic Status: Newly Diagnosed (Clinical Staging) ?AJCC M Category: cM0 ?AJCC 8 Stage Grouping: IIIC1 ?AJCC T Category: cT3 ?AJCC N Category: cN1 ? ?Intent of Therapy: ?Curative Intent, Discussed with Patient ?

## 2021-08-01 ENCOUNTER — Telehealth: Payer: Self-pay

## 2021-08-01 ENCOUNTER — Encounter: Payer: Self-pay | Admitting: Oncology

## 2021-08-01 NOTE — Telephone Encounter (Signed)
Patient scheduled for port placement on 5/26.   Please schedule lab/MD/ Carbo / taxol NEW (q 3 weeks tx) , Udenyca D3- the week of 5/29, if no room, then the week of 6/5. I will call pt to inform her of appts.

## 2021-08-01 NOTE — Telephone Encounter (Signed)
Omiseq requested on endometrial biopsy collected 04/01/2021.

## 2021-08-01 NOTE — Telephone Encounter (Signed)
Patient informed of appts and verbalized understanding. Will pick up AVS when she has chemo class.

## 2021-08-01 NOTE — Addendum Note (Signed)
Addended by: Earlie Server on: 08/01/2021 08:34 AM   Modules accepted: Orders

## 2021-08-02 ENCOUNTER — Other Ambulatory Visit: Payer: Self-pay

## 2021-08-05 ENCOUNTER — Ambulatory Visit
Admission: RE | Admit: 2021-08-05 | Discharge: 2021-08-05 | Disposition: A | Discharge status: Home / Self Care | Payer: Medicare PPO | Source: Ambulatory Visit | Attending: Oncology | Admitting: Oncology

## 2021-08-05 DIAGNOSIS — E278 Other specified disorders of adrenal gland: Secondary | ICD-10-CM | POA: Insufficient documentation

## 2021-08-05 MED ORDER — GADOBUTROL 1 MMOL/ML IV SOLN
10.0000 mL | Freq: Once | INTRAVENOUS | Status: AC | PRN
  Administered 2021-08-05: 10 mL via INTRAVENOUS

## 2021-08-05 NOTE — Progress Notes (Signed)
IVT consulted for difficult stick.  Upon arrival, IV already established.

## 2021-08-06 ENCOUNTER — Inpatient Hospital Stay: Payer: Medicare PPO

## 2021-08-08 ENCOUNTER — Other Ambulatory Visit: Payer: Self-pay | Admitting: Internal Medicine

## 2021-08-08 NOTE — H&P (Signed)
Chief Complaint: Patient was seen in consultation today for port placement  Referring Physician(s): Yu,Zhou  Supervising Physician: Juliet Rude  Patient Status: [facility] - Out-pt  History of Present Illness: Valerie Case is a 71 y.o. female with a past medical history significant for depression, asthma, HTN, DM, anemia, left adrenal mass of unknown etiology, endometrial adenocarcinoma s/p palliative radiation who presents today for port placement. [name] was diagnosed with FIGO grade III endometrial adenocarcinoma in January of this year after presenting to the ED with vaginal bleeding and symptomatic anemia. She is follow by gyn/onc and completed palliative radiation in March of this year, she is not currently a surgical candidate. She was referred to medical oncology and decision was made to proceed with systemic chemotherapy. IR has been consulted for port placement.  Past Medical History:  Diagnosis Date   Asthma    Depression 04/01/2021   Diabetes mellitus without complication ([place])    Essential hypertension 04/01/2021   Hx of dysplastic nevus 07/16/2010   RLQA   Hypertension    Insulin dependent type 2 diabetes mellitus ([place]) 04/01/2021    Past Surgical History:  Procedure Laterality Date   CESAREAN SECTION  01/23/1979   TONSILLECTOMY  03/17/1956    Allergies: Aspirin  Medications: Prior to Admission medications   Medication Sig Start Date End Date Taking? Authorizing Provider  acetaminophen (TYLENOL) 650 MG CR tablet Take 1,300 mg by mouth every 8 (eight) hours as needed for pain.    [provider]  albuterol (VENTOLIN HFA) 108 (90 Base) MCG/ACT inhaler Inhale 2 puffs into the lungs every 6 (six) hours as needed for wheezing or shortness of breath.    [provider]  empagliflozin (JARDIANCE) 25 MG TABS tablet Take 25 mg by mouth daily.    [provider]  ferrous sulfate 325 (65 FE) MG EC tablet Take 325 mg by mouth daily  with breakfast. 08/29/19   [provider]  FLUoxetine (PROZAC) 10 MG capsule Take 10 mg by mouth daily. 10/03/19   [provider]  fluticasone (FLONASE) 50 MCG/ACT nasal spray Place 1 spray into both nostrils daily as needed for allergies or rhinitis.    [provider]  hydrochlorothiazide (HYDRODIURIL) 12.5 MG tablet Take 12.5 mg by mouth daily. 07/27/19   [provider]  ibuprofen (ADVIL) 200 MG tablet Take 400 mg by mouth every 8 (eight) hours as needed for moderate pain.    [provider]  lidocaine-prilocaine (EMLA) cream Apply to affected area once 07/31/21   Earlie Server, MD  lisinopril (ZESTRIL) 40 MG tablet Take 40 mg by mouth at bedtime. Patient not taking: Reported on 07/31/2021    [provider]  metFORMIN (GLUCOPHAGE-XR) 500 MG 24 hr tablet Take 1,000 mg by mouth at bedtime.    [provider]  metoprolol succinate (TOPROL-XL) 25 MG 24 hr tablet Take 25 mg by mouth daily. 09/22/19   [provider]  NOVOLIN 70/30 FLEXPEN (70-30) 100 UNIT/ML KwikPen 50-110 Units See admin instructions. 50 units in the morning, 110 units at bedtime 11/11/19   [provider]  ondansetron (ZOFRAN) 8 MG tablet Take 1 tablet (8 mg total) by mouth 2 (two) times daily as needed. Start on the third day after chemotherapy. 07/31/21   Earlie Server, MD  OZEMPIC, 1 MG/DOSE, 4 MG/3ML SOPN Inject 1 mg into the skin every Tuesday. 09/27/19   [provider]  prochlorperazine (COMPAZINE) 10 MG tablet Take 1 tablet (10 mg total)  by mouth every 6 (six) hours as needed (Nausea or vomiting). 07/31/21   Earlie Server, MD  rosuvastatin (CRESTOR) 10 MG tablet Take 10 mg by mouth daily. 09/26/19   [provider]     Family History  Problem Relation Age of Onset   Breast cancer Mother 41   Cancer Mother    Cancer Father    Lung cancer Father    Breast cancer Cousin        maternal side    Social History   Socioeconomic History    Marital status: Divorced    Spouse name: Not on file   Number of children: Not on file   Years of education: Not on file   Highest education level: Not on file  Occupational History   Not on file  Tobacco Use   Smoking status: Never   Smokeless tobacco: Never  Vaping Use   Vaping Use: Never used  Substance and Sexual Activity   Alcohol use: Not Currently   Drug use: Never   Sexual activity: Not Currently  Other Topics Concern   Not on file  Social History Narrative      Social Determinants of Health   Financial Resource Strain: Not on file  Food Insecurity: Not on file  Transportation Needs: Not on file  Physical Activity: Not on file  Stress: Not on file  Social Connections: Not on file     Review of Systems: A 12 point ROS discussed and pertinent positives are indicated in the HPI above.  All other systems are negative.  Review of Systems  Constitutional:  Negative for chills and fever.  Respiratory:  Negative for cough and shortness of breath.   Cardiovascular:  Negative for chest pain.  Gastrointestinal:  Negative for abdominal pain, diarrhea, nausea and vomiting.  Musculoskeletal:  Negative for back pain.  Neurological:  Negative for dizziness and headaches.   Vital Signs: BP (!) 174/84   Pulse 81   Temp 98.4 F (36.9 C) (Oral)   Resp 18   Ht '5\' 8"'$  (1.727 m)   Wt 280 lb (127 kg)   SpO2 97%   BMI 42.57 kg/m   Physical Exam Vitals reviewed.  Constitutional:      General: She is not in acute distress. HENT:     Head: Normocephalic.     Mouth/Throat:     Mouth: Mucous membranes are moist.     Pharynx: Oropharynx is clear. No oropharyngeal exudate or posterior oropharyngeal erythema.  Cardiovascular:     Rate and Rhythm: Normal rate and regular rhythm.  Pulmonary:     Effort: Pulmonary effort is normal.     Breath sounds: Normal breath sounds.  Abdominal:     General: There is no distension.     Palpations: Abdomen is soft.     Tenderness: There  is no abdominal tenderness.  Skin:    General: Skin is warm and dry.  Neurological:     Mental Status: She is alert and oriented to person, place, and time.  Psychiatric:        Mood and Affect: Mood normal.        Behavior: Behavior normal.        Thought Content: Thought content normal.        Judgment: Judgment normal.     MD Evaluation Airway: WNL Heart: WNL Abdomen: WNL Chest/ Lungs: WNL ASA  Classification: 2 Mallampati/Airway Score: Two   Imaging: MR ABDOMEN W WO CONTRAST  Result Date: 08/07/2021 CLINICAL  DATA:  Further characterization of known left adrenal lesion. EXAM: MRI ABDOMEN WITHOUT AND WITH CONTRAST TECHNIQUE: Multiplanar multisequence MR imaging of the abdomen was performed both before and after the administration of intravenous contrast. CONTRAST:  94m GADAVIST GADOBUTROL 1 MMOL/ML IV SOLN COMPARISON:  Multiple priors including most recent PET-CT June 10, 2021 and most remote CT December 12, 2020 FINDINGS: Lower chest: No acute abnormality. Hepatobiliary: Hepatomegaly measuring 23.8 cm in maximum craniocaudal dimension. Mild loss of signal in the hepatic parenchyma on out of phase imaging consistent with hepatic steatosis. Well-circumscribed fluid intensity cyst in the left lobe of the liver measures 3.2 cm. No solid enhancing hepatic lesion. Gallbladder is unremarkable. No biliary ductal dilation. Pancreas: No pancreatic ductal dilation. Intrinsic T1 signal of the pancreatic parenchyma is within normal limits. Pancreas demonstrates homogeneous enhancement postcontrast administration. No cystic or solid hyperenhancing pancreatic lesion identified. Spleen:  No splenomegaly. Adrenals/Urinary Tract: Solid enhancing left adrenal lesion measures 2.7 cm on image 31/12, which does not demonstrate loss of signal on out of phase imaging. Right adrenal gland is unremarkable. No hydronephrosis. Nonenhancing benign 15 mm Bosniak classification 2 left interpolar renal cyst is benign  and does not require independent follow-up. No suspicious renal mass. Stomach/Bowel: Colonic diverticulosis without findings of acute diverticulitis. No bowel obstruction. Vascular/Lymphatic: Normal caliber abdominal aorta. No pathologically enlarged abdominal lymph nodes. Other:  No significant abdominal free fluid. Musculoskeletal: No suspicious bone lesions identified. IMPRESSION: 1. Stable solid enhancing 2.7 cm left adrenal lesion, which was hypermetabolic on prior PET/CTs but is unchanged in size dating back to December 12, 2020. 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 However, unfortunately metastatic disease can not be entirely excluded and remains a pertinent differential consideration. Comparison with more remote prior imaging would be the most valuable tool in the assessment of this lesion, as demonstrating long-term stability would indicate this to be a benign lesion. However, if no prior imaging can be made available, would consider follow-up adrenal protocol CT with and without intravenous contrast material in 3 months as this would allow for further assessment of stability as well as enhancement and washout characteristics of the lesion potentially allowing for better characterization versus direct tissue sampling. 2. Hepatomegaly and hepatic steatosis. 3. Colonic diverticulosis without findings of acute diverticulitis. Electronically Signed   By: JDahlia BailiffM.D.   On: 08/07/2021 07:06    Labs:  CBC: Recent Labs    04/29/21 0940 05/17/21 1024 06/26/21 1422 07/31/21 1511  WBC 6.5 5.0 5.2 7.2  HGB 8.7* 8.0* 11.2* 12.0  HCT 27.9* 25.0* 34.8* 37.7  PLT 318 209 295 305    COAGS: No results for input(s): INR, APTT in the last 8760 hours.  BMP: Recent Labs    04/03/21 0440 05/17/21 1024 06/26/21 1422  07/31/21 1511  NA 135 136 136 139  K 4.2 3.4* 3.9 4.2  CL 107 107 105 103  CO2 24 21* 23 27  GLUCOSE 221* 203* 153* 151*  BUN 49* 25* 31* 24*  CALCIUM 8.6* 8.1* 9.7 10.6*  CREATININE 1.11* 1.03* 0.78 0.98  GFRNONAA 54* 59* >60 >60    LIVER FUNCTION TESTS: Recent Labs    04/02/21 0642 04/11/21 1553 05/17/21 1024 06/26/21 1422 07/31/21 1511  BILITOT 0.5 0.3 <0.1* 0.3 0.5  AST 14*  --  '17 18 21  '$ ALT 12  --  13 15  16  ALKPHOS 61  --  53 71 67  PROT 6.4*  --  6.7 7.6 7.8  ALBUMIN 3.3*  --  2.9* 3.9 3.9    TUMOR MARKERS: No results for input(s): AFPTM, CEA, CA199, CHROMGRNA in the last 8760 hours.  Assessment and Plan:  71 y/o F diagnosed with FIGO grade III endometrial adenocarcinoma in January of this year who presents today for port placement prior to beginning chemotherapy.  Risks and benefits of image-guided Port-a-catheter placement were discussed with the patient including, but not limited to bleeding, infection, pneumothorax, or fibrin sheath development and need for additional procedures.  All of the patient's questions were answered, patient is agreeable to proceed.  Consent signed and in chart.  Thank you for this interesting consult.  I greatly enjoyed meeting MARISABEL MACPHERSON and look forward to participating in their care.  A copy of this report was sent to the requesting provider on this date.  Electronically Signed: Joaquim Nam, PA-C 08/08/2021, 12:57 PM   I spent a total of  30 Minutes   in face to face in clinical consultation, greater than 50% of which was counseling/coordinating care for port placement.

## 2021-08-09 ENCOUNTER — Other Ambulatory Visit: Payer: Self-pay

## 2021-08-09 ENCOUNTER — Encounter: Payer: Self-pay | Admitting: Radiology

## 2021-08-09 ENCOUNTER — Ambulatory Visit
Admission: RE | Admit: 2021-08-09 | Discharge: 2021-08-09 | Disposition: A | Discharge status: Home / Self Care | Payer: Medicare PPO | Source: Ambulatory Visit | Attending: Oncology | Admitting: Oncology

## 2021-08-09 DIAGNOSIS — Z7984 Long term (current) use of oral hypoglycemic drugs: Secondary | ICD-10-CM | POA: Diagnosis not present

## 2021-08-09 DIAGNOSIS — I10 Essential (primary) hypertension: Secondary | ICD-10-CM | POA: Diagnosis not present

## 2021-08-09 DIAGNOSIS — E119 Type 2 diabetes mellitus without complications: Secondary | ICD-10-CM | POA: Insufficient documentation

## 2021-08-09 DIAGNOSIS — Z7985 Long-term (current) use of injectable non-insulin antidiabetic drugs: Secondary | ICD-10-CM | POA: Diagnosis not present

## 2021-08-09 DIAGNOSIS — J45909 Unspecified asthma, uncomplicated: Secondary | ICD-10-CM | POA: Insufficient documentation

## 2021-08-09 DIAGNOSIS — F32A Depression, unspecified: Secondary | ICD-10-CM | POA: Insufficient documentation

## 2021-08-09 DIAGNOSIS — C541 Malignant neoplasm of endometrium: Secondary | ICD-10-CM | POA: Diagnosis present

## 2021-08-09 DIAGNOSIS — D649 Anemia, unspecified: Secondary | ICD-10-CM | POA: Insufficient documentation

## 2021-08-09 DIAGNOSIS — E278 Other specified disorders of adrenal gland: Secondary | ICD-10-CM | POA: Diagnosis not present

## 2021-08-09 HISTORY — DX: Malignant (primary) neoplasm, unspecified: C80.1

## 2021-08-09 HISTORY — PX: IR IMAGING GUIDED PORT INSERTION: IMG5740

## 2021-08-09 LAB — GLUCOSE, CAPILLARY
Glucose-Capillary: 139 mg/dL — ABNORMAL HIGH (ref 70–99)
Glucose-Capillary: 127 mg/dL — ABNORMAL HIGH (ref 70–99)

## 2021-08-09 MED ORDER — FENTANYL CITRATE (PF) 100 MCG/2ML IJ SOLN
INTRAMUSCULAR | Status: AC | PRN
  Administered 2021-08-09: 50 ug via INTRAVENOUS
  Administered 2021-08-09: 25 ug via INTRAVENOUS

## 2021-08-09 MED ORDER — FENTANYL CITRATE (PF) 100 MCG/2ML IJ SOLN
INTRAMUSCULAR | Status: AC
  Filled 2021-08-09: qty 2

## 2021-08-09 MED ORDER — HEPARIN SOD (PORK) LOCK FLUSH 100 UNIT/ML IV SOLN
INTRAVENOUS | Status: AC
Start: 2021-08-09 — End: 2021-08-09
  Administered 2021-08-09: 500 [IU]
  Filled 2021-08-09: qty 5

## 2021-08-09 MED ORDER — MIDAZOLAM HCL 2 MG/2ML IJ SOLN
INTRAMUSCULAR | Status: AC | PRN
Start: 2021-08-09 — End: 2021-08-09
  Administered 2021-08-09: 1 mg via INTRAVENOUS
  Administered 2021-08-09: .5 mg via INTRAVENOUS

## 2021-08-09 MED ORDER — MIDAZOLAM HCL 2 MG/2ML IJ SOLN
INTRAMUSCULAR | Status: AC
  Filled 2021-08-09: qty 2

## 2021-08-09 MED ORDER — LIDOCAINE-EPINEPHRINE 1 %-1:100000 IJ SOLN
INTRAMUSCULAR | Status: AC
  Administered 2021-08-09: 20 mL
  Filled 2021-08-09: qty 1

## 2021-08-09 MED ORDER — SODIUM CHLORIDE 0.9 % IV SOLN
INTRAVENOUS | Status: DC
  Filled 2021-08-09: qty 1000

## 2021-08-09 NOTE — Procedures (Signed)
Interventional Radiology Procedure Note  Date of Procedure: 08/09/2021  Procedure: Port placement   Findings:  1. Port placement, right chest    Complications: No immediate complications noted.   Estimated Blood Loss: minimal  Follow-up and Recommendations: 1. Ready for use    Albin Felling, MD  Vascular & Interventional Radiology  08/09/2021 1:51 PM

## 2021-08-09 NOTE — Progress Notes (Signed)
Pharmacist Chemotherapy Monitoring - Initial Assessment    Anticipated start date: 08/16/21   The following has been reviewed per standard work regarding the patient's treatment regimen: The patient's diagnosis, treatment plan and drug doses, and organ/hematologic function Lab orders and baseline tests specific to treatment regimen  The treatment plan start date, drug sequencing, and pre-medications Prior authorization status  Patient's documented medication list, including drug-drug interaction screen and prescriptions for anti-emetics and supportive care specific to the treatment regimen The drug concentrations, fluid compatibility, administration routes, and timing of the medications to be used The patient's access for treatment and lifetime cumulative dose history, if applicable  The patient's medication allergies and previous infusion related reactions, if applicable   Changes made to treatment plan:  treatment plan date  Follow up needed:  [place], [place], 08/09/2021  9:07 AM

## 2021-08-14 ENCOUNTER — Ambulatory Visit: Payer: Medicare PPO | Admitting: Oncology

## 2021-08-14 ENCOUNTER — Other Ambulatory Visit: Payer: Medicare PPO

## 2021-08-15 ENCOUNTER — Ambulatory Visit: Payer: Medicare PPO

## 2021-08-15 ENCOUNTER — Inpatient Hospital Stay: Payer: Medicare PPO | Attending: Obstetrics and Gynecology

## 2021-08-15 ENCOUNTER — Encounter: Payer: Self-pay | Admitting: Oncology

## 2021-08-15 ENCOUNTER — Inpatient Hospital Stay: Payer: Medicare PPO | Admitting: Oncology

## 2021-08-15 VITALS — BP 134/80 | HR 101 | Temp 98.1°F | Wt 289.0 lb

## 2021-08-15 DIAGNOSIS — Z5189 Encounter for other specified aftercare: Secondary | ICD-10-CM | POA: Diagnosis not present

## 2021-08-15 DIAGNOSIS — C541 Malignant neoplasm of endometrium: Secondary | ICD-10-CM

## 2021-08-15 DIAGNOSIS — R5383 Other fatigue: Secondary | ICD-10-CM | POA: Insufficient documentation

## 2021-08-15 DIAGNOSIS — E1165 Type 2 diabetes mellitus with hyperglycemia: Secondary | ICD-10-CM | POA: Insufficient documentation

## 2021-08-15 DIAGNOSIS — K5909 Other constipation: Secondary | ICD-10-CM | POA: Insufficient documentation

## 2021-08-15 DIAGNOSIS — D649 Anemia, unspecified: Secondary | ICD-10-CM | POA: Diagnosis present

## 2021-08-15 DIAGNOSIS — T451X5A Adverse effect of antineoplastic and immunosuppressive drugs, initial encounter: Secondary | ICD-10-CM | POA: Insufficient documentation

## 2021-08-15 DIAGNOSIS — D500 Iron deficiency anemia secondary to blood loss (chronic): Secondary | ICD-10-CM | POA: Diagnosis not present

## 2021-08-15 DIAGNOSIS — N2889 Other specified disorders of kidney and ureter: Secondary | ICD-10-CM | POA: Insufficient documentation

## 2021-08-15 DIAGNOSIS — Z886 Allergy status to analgesic agent status: Secondary | ICD-10-CM | POA: Insufficient documentation

## 2021-08-15 DIAGNOSIS — Z6841 Body Mass Index (BMI) 40.0 and over, adult: Secondary | ICD-10-CM | POA: Diagnosis not present

## 2021-08-15 DIAGNOSIS — D509 Iron deficiency anemia, unspecified: Secondary | ICD-10-CM | POA: Insufficient documentation

## 2021-08-15 DIAGNOSIS — D6481 Anemia due to antineoplastic chemotherapy: Secondary | ICD-10-CM | POA: Diagnosis not present

## 2021-08-15 DIAGNOSIS — C7970 Secondary malignant neoplasm of unspecified adrenal gland: Secondary | ICD-10-CM | POA: Insufficient documentation

## 2021-08-15 DIAGNOSIS — E278 Other specified disorders of adrenal gland: Secondary | ICD-10-CM | POA: Diagnosis not present

## 2021-08-15 DIAGNOSIS — M2550 Pain in unspecified joint: Secondary | ICD-10-CM | POA: Insufficient documentation

## 2021-08-15 DIAGNOSIS — M7989 Other specified soft tissue disorders: Secondary | ICD-10-CM | POA: Diagnosis not present

## 2021-08-15 DIAGNOSIS — R160 Hepatomegaly, not elsewhere classified: Secondary | ICD-10-CM | POA: Insufficient documentation

## 2021-08-15 DIAGNOSIS — Z79899 Other long term (current) drug therapy: Secondary | ICD-10-CM | POA: Insufficient documentation

## 2021-08-15 DIAGNOSIS — N281 Cyst of kidney, acquired: Secondary | ICD-10-CM | POA: Diagnosis not present

## 2021-08-15 DIAGNOSIS — K7689 Other specified diseases of liver: Secondary | ICD-10-CM | POA: Diagnosis not present

## 2021-08-15 DIAGNOSIS — Z5111 Encounter for antineoplastic chemotherapy: Secondary | ICD-10-CM | POA: Diagnosis present

## 2021-08-15 DIAGNOSIS — J45909 Unspecified asthma, uncomplicated: Secondary | ICD-10-CM | POA: Diagnosis not present

## 2021-08-15 DIAGNOSIS — Z803 Family history of malignant neoplasm of breast: Secondary | ICD-10-CM | POA: Insufficient documentation

## 2021-08-15 DIAGNOSIS — K760 Fatty (change of) liver, not elsewhere classified: Secondary | ICD-10-CM | POA: Diagnosis not present

## 2021-08-15 DIAGNOSIS — Z809 Family history of malignant neoplasm, unspecified: Secondary | ICD-10-CM | POA: Insufficient documentation

## 2021-08-15 DIAGNOSIS — Z801 Family history of malignant neoplasm of trachea, bronchus and lung: Secondary | ICD-10-CM | POA: Insufficient documentation

## 2021-08-15 DIAGNOSIS — I10 Essential (primary) hypertension: Secondary | ICD-10-CM | POA: Insufficient documentation

## 2021-08-15 DIAGNOSIS — K5730 Diverticulosis of large intestine without perforation or abscess without bleeding: Secondary | ICD-10-CM | POA: Diagnosis not present

## 2021-08-15 DIAGNOSIS — I872 Venous insufficiency (chronic) (peripheral): Secondary | ICD-10-CM | POA: Insufficient documentation

## 2021-08-15 DIAGNOSIS — Z923 Personal history of irradiation: Secondary | ICD-10-CM | POA: Insufficient documentation

## 2021-08-15 DIAGNOSIS — E119 Type 2 diabetes mellitus without complications: Secondary | ICD-10-CM

## 2021-08-15 DIAGNOSIS — E538 Deficiency of other specified B group vitamins: Secondary | ICD-10-CM | POA: Insufficient documentation

## 2021-08-15 DIAGNOSIS — Z95828 Presence of other vascular implants and grafts: Secondary | ICD-10-CM | POA: Insufficient documentation

## 2021-08-15 DIAGNOSIS — Z794 Long term (current) use of insulin: Secondary | ICD-10-CM

## 2021-08-15 LAB — COMPREHENSIVE METABOLIC PANEL
ALT: 14 U/L (ref 0–44)
AST: 24 U/L (ref 15–41)
Albumin: 3.9 g/dL (ref 3.5–5.0)
Alkaline Phosphatase: 65 U/L (ref 38–126)
Anion gap: 9 (ref 5–15)
BUN: 28 mg/dL — ABNORMAL HIGH (ref 8–23)
CO2: 24 mmol/L (ref 22–32)
Calcium: 9.8 mg/dL (ref 8.9–10.3)
Chloride: 105 mmol/L (ref 98–111)
Creatinine, Ser: 0.95 mg/dL (ref 0.44–1.00)
GFR, Estimated: >60 mL/min (ref >60)
Glucose, Bld: 255 mg/dL — ABNORMAL HIGH (ref 70–99)
Potassium: 3.9 mmol/L (ref 3.5–5.1)
Sodium: 138 mmol/L (ref 135–145)
Total Bilirubin: 0.3 mg/dL (ref 0.3–1.2)
Total Protein: 7.6 g/dL (ref 6.5–8.1)

## 2021-08-15 LAB — CBC WITH DIFFERENTIAL/PLATELET
Abs Immature Granulocytes: 0.04 10*3/uL (ref 0.00–0.07)
Basophils Absolute: 0 10*3/uL (ref 0.0–0.1)
Basophils Relative: 0 %
Eosinophils Absolute: 0.3 10*3/uL (ref 0.0–0.5)
Eosinophils Relative: 4 %
HCT: 38 % (ref 36.0–46.0)
Hemoglobin: 11.9 g/dL — ABNORMAL LOW (ref 12.0–15.0)
Immature Granulocytes: 1 %
Lymphocytes Relative: 13 %
Lymphs Abs: 0.9 10*3/uL (ref 0.7–4.0)
MCH: 27.9 pg (ref 26.0–34.0)
MCHC: 31.3 g/dL (ref 30.0–36.0)
MCV: 89 fL (ref 80.0–100.0)
Monocytes Absolute: 0.6 10*3/uL (ref 0.1–1.0)
Monocytes Relative: 8 %
Neutro Abs: 5.1 10*3/uL (ref 1.7–7.7)
Neutrophils Relative %: 74 %
Platelets: 250 10*3/uL (ref 150–400)
RBC: 4.27 MIL/uL (ref 3.87–5.11)
RDW: 15.3 % (ref 11.5–15.5)
WBC: 6.9 10*3/uL (ref 4.0–10.5)
nRBC: 0 % (ref 0.0–0.2)

## 2021-08-15 MED ORDER — LORAZEPAM 0.5 MG PO TABS: 0.5000 mg | ORAL_TABLET | Freq: Three times a day (TID) | ORAL | 0 refills | Status: DC | PRN

## 2021-08-15 MED FILL — Dexamethasone Sodium Phosphate Inj 100 MG/10ML: INTRAMUSCULAR | Qty: 1 | Status: AC

## 2021-08-15 MED FILL — Fosaprepitant Dimeglumine For IV Infusion 150 MG (Base Eq): INTRAVENOUS | Qty: 5 | Status: AC

## 2021-08-15 NOTE — Progress Notes (Signed)
Hematology/Oncology Progress note [phone] [phone]         Patient Care Team: Tracie Harrier, MD as PCP - General (Internal Medicine)  REFERRING PROVIDER: Tracie Harrier, MD Dr. Fransisca Connors CHIEF COMPLAINTS/REASON FOR VISIT:  anemia and endometrial cancer  HISTORY OF PRESENTING ILLNESS:   Valerie Case is a  71 y.o.  female with PMH listed below was seen in consultation at the request of  Tracie Harrier, MD  for evaluation of anemia.   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 04/01/2021 - 04/03/2021, patient was hospitalized due to symptomatic anemia, hemoglobin 6.9, heavy postmenopausal bleeding with passing large clots.  She also presented with increased creatinine level to 2.58 compared to her baseline level of 0.9 in November 2022.  Patient received IV iron infusion and 2 units of PRBC during the hospital stay..  04/02/2021, iron panel showed iron saturation 37, ferritin 37, TIBC 333-the studies were done after patient received blood transfusion.  At discharge, hemoglobin was 7.7. 04/01/2020, endometrial biopsy showed poorly differentiated endometrial adenocarcinoma.  #Last year, patient has experienced hematuria in September 2022. 12/12/2020, CT abdomen pelvis with contrast showed no radiographic evidence of urinary tract neoplasm, calculi, hydronephrosis.  2.6 cm nonspecific left adrenal mass. Hematuria persisted and 02/12/2021 CT hematuria work-up showed small uterine fibroids, no findings to explain hematuria. Hepatomegaly, diverticulosis without evidence of diverticulitis, Coronary artery disease  04/09/2021, vitamin B12 136. 04/10/2021, patient was seen by Dr. Fransisca Connors, patient continues to have vaginal bleeding, passing clots.  CBC was checked on 04/10/2021, hemoglobin was 8.2.  MCV 87 0.7.  Platelet count 406, white count of 10.3.  Patient received 1 unit of PRBC transfusion on  04/11/2020 prior to establish care with me on 04/11/2020 She was accompanied by her daughter.  04/15/21 PET showed  1. Large hypermetabolic endometrial mass consistent with known endometrial cancer. Suspect direct invasion/involvement of the right adnexa. Right pelvic sidewall hypermetabolic adenopathy.2. 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 3. No findings for metastatic disease involving the chest or bonystructures.  #04/24/2021 - 05/29/2021 status post radiation to pelvis.  06/10/2021, post radiation PET scan showed substantial reduction in the size and activity of the uterine mass.  Reduction of this size and activity of the right pelvic sidewall lymph node. moderately hypermetabolic left adrenal mass is stable   #Per my discussion with Dr. Theora Gianotti, patient is not a good surgery candidate at this point.  She will be reevaluated in the future.  #Right renal mass with uncertain etiology.  FDG avid on PET scan, patient has had negative biochemical testing for pheochromocytoma.  Patient denies family history of pheochromocytoma or clinical history of resistant hypertension. Patient was seen by urology and recommended for biopsy.  IR recommended renal MRI.   INTERVAL HISTORY Valerie Case is a 71 y.o. female who has above history reviewed by me today presents for follow up visit for anemia and endometrial cancer  Patient walked from parking lot to cancer center and felt more shortness of breath with exertion.  She feels her asthma is acting up a bit.  After resting, shortness of breath is better.  She has been to chemotherapy class. She has antiemetics prescription filled.  She understands the instructions well..  Review of Systems  Constitutional:  Positive for fatigue. Negative for chills and fever.  HENT:   Negative for hearing loss and voice change.  Eyes:  Negative for eye problems.   Respiratory:  Negative for chest tightness and cough.   Cardiovascular:  Negative for chest pain.  Gastrointestinal:  Negative for abdominal distention, abdominal pain and blood in stool.  Endocrine: Negative for hot flashes.  Genitourinary:  Negative for difficulty urinating, frequency and vaginal bleeding.   Musculoskeletal:  Negative for arthralgias.  Skin:  Negative for itching and rash.  Neurological:  Negative for extremity weakness.  Hematological:  Negative for adenopathy.  Psychiatric/Behavioral:  Negative for confusion.    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    SURGICAL HISTORY: Past Surgical History:  Procedure Laterality Date   CESAREAN SECTION  01/23/1979   IR IMAGING GUIDED PORT INSERTION  08/09/2021   TONSILLECTOMY  03/17/1956    SOCIAL HISTORY: Social History   Socioeconomic History   Marital status: Divorced    Spouse name: Not on file   Number of children: Not on file   Years of education: Not on file   Highest education level: Not on file  Occupational History   Not on file  Tobacco Use   Smoking status: Never   Smokeless tobacco: Never  Vaping Use   Vaping Use: Never used  Substance and Sexual Activity   Alcohol use: Not Currently   Drug use: Never   Sexual activity: Not Currently  Other Topics Concern   Not on file  Social History Narrative      Social Determinants of Health   Financial Resource Strain: Not on file  Food Insecurity: Not on file  Transportation Needs: Not on file  Physical Activity: Not on file  Stress: Not on file  Social Connections: Not on file  Intimate Partner Violence: Not on file    FAMILY HISTORY: Family History  Problem Relation  Age of Onset   Breast cancer Mother 59   Cancer Mother    Cancer Father    Lung cancer Father    Breast cancer Cousin        maternal side    ALLERGIES:  is allergic to aspirin.  MEDICATIONS:  Current Outpatient Medications  Medication Sig Dispense Refill   acetaminophen (TYLENOL) 650 MG CR tablet Take 1,300 mg by mouth every 8 (eight) hours as needed for pain.     albuterol (VENTOLIN HFA) 108 (90 Base) MCG/ACT inhaler Inhale 2 puffs into the lungs every 6 (six) hours as needed for wheezing or shortness of breath.     Cranberry-Vitamin C-Probiotic (AZO CRANBERRY PO) Take by mouth.     empagliflozin (JARDIANCE) 25 MG TABS tablet Take 25 mg by mouth daily.     ferrous sulfate 325 (65 FE) MG EC tablet Take 325 mg by mouth daily with breakfast.     FLUoxetine (PROZAC) 10 MG capsule Take 10 mg by mouth daily.     fluticasone (FLONASE) 50 MCG/ACT nasal spray Place 1 spray into both nostrils daily as needed for allergies or rhinitis.     hydrochlorothiazide (HYDRODIURIL) 12.5 MG tablet Take 12.5 mg by mouth daily.     ibuprofen (ADVIL) 200 MG tablet Take 400 mg by mouth every 8 (eight) hours as needed for moderate pain.     lidocaine-prilocaine (EMLA) cream  Apply to affected area once 30 g 3   LORazepam (ATIVAN) 0.5 MG tablet Take 1 tablet (0.5 mg total) by mouth every 8 (eight) hours as needed for anxiety (nausea vomiting). 30 tablet 0   metFORMIN (GLUCOPHAGE-XR) 500 MG 24 hr tablet Take 1,000 mg by mouth at bedtime.     metoprolol succinate (TOPROL-XL) 25 MG 24 hr tablet Take 25 mg by mouth daily.     NOVOLIN 70/30 FLEXPEN (70-30) 100 UNIT/ML KwikPen 50-110 Units See admin instructions. 50 units in the morning, 110 units at bedtime     ondansetron (ZOFRAN) 8 MG tablet Take 1 tablet (8 mg total) by mouth 2 (two) times daily as needed. Start on the third day after chemotherapy. 30 tablet 1   OZEMPIC, 1 MG/DOSE, 4 MG/3ML SOPN Inject 1 mg into the skin every Tuesday.     prochlorperazine  (COMPAZINE) 10 MG tablet Take 1 tablet (10 mg total) by mouth every 6 (six) hours as needed (Nausea or vomiting). 30 tablet 1   rosuvastatin (CRESTOR) 10 MG tablet Take 10 mg by mouth daily.     lisinopril (ZESTRIL) 40 MG tablet Take 40 mg by mouth at bedtime. (Patient not taking: Reported on 07/31/2021)     No current facility-administered medications for this visit.     PHYSICAL EXAMINATION: ECOG PERFORMANCE STATUS: 1 - Symptomatic but completely ambulatory  Physical Exam Constitutional:      General: She is not in acute distress.    Appearance: She is obese.  HENT:     Head: Normocephalic and atraumatic.  Eyes:     General: No scleral icterus. Cardiovascular:     Rate and Rhythm: Normal rate and regular rhythm.     Heart sounds: Normal heart sounds.  Pulmonary:     Effort: Pulmonary effort is normal. No respiratory distress.     Breath sounds: No wheezing.  Abdominal:     General: Bowel sounds are normal. There is no distension.     Palpations: Abdomen is soft.  Musculoskeletal:        General: No deformity. Normal range of motion.     Cervical back: Normal range of motion and neck supple.     Right lower leg: Edema present.     Left lower leg: Edema present.     Comments:    Skin:    General: Skin is warm and dry.     Findings: No erythema or rash.     Comments: Chronic venous sufficiency skin changes  Neurological:     Mental Status: She is alert and oriented to person, place, and time. Mental status is at baseline.     Cranial Nerves: No cranial nerve deficit.     Coordination: Coordination normal.  Psychiatric:        Mood and Affect: Mood normal.     LABORATORY DATA:  I have reviewed the data as listed Lab Results  Component Value Date   WBC 6.9 08/15/2021   HGB 11.9 (L) 08/15/2021   HCT 38.0 08/15/2021   MCV 89.0 08/15/2021   PLT 250 08/15/2021   Recent Labs    04/11/21 1553 05/17/21 1024 06/26/21 1422 07/31/21 1511 08/15/21 0951  NA  --    < >  136 139 138  K  --    < > 3.9 4.2 3.9  CL  --    < > 105 103 105  CO2  --    < > '23 27 24  '$ GLUCOSE  --    < >  153* 151* 255*  BUN  --    < > 31* 24* 28*  CREATININE  --    < > 0.78 0.98 0.95  CALCIUM  --    < > 9.7 10.6* 9.8  GFRNONAA  --    < > >60 >60 >60  PROT  --    < > 7.6 7.8 7.6  ALBUMIN  --    < > 3.9 3.9 3.9  AST  --    < > '18 21 24  '$ ALT  --    < > '15 16 14  '$ ALKPHOS  --    < > 71 67 65  BILITOT 0.3   < > 0.3 0.5 0.3  BILIDIR <0.1  --   --   --   --   IBILI NOT CALCULATED  --   --   --   --    < > = values in this interval not displayed.    Iron/TIBC/Ferritin/ %Sat    Component Value Date/Time   IRON 53 07/31/2021 1511   IRON 84 06/08/2012 1128   TIBC 357 07/31/2021 1511   TIBC 403 06/08/2012 1128   FERRITIN 44 07/31/2021 1511   FERRITIN 68 06/08/2012 1128   IRONPCTSAT 15 07/31/2021 1511   IRONPCTSAT 21 06/08/2012 1128      RADIOGRAPHIC STUDIES: I have personally reviewed the radiological images as listed and agreed with the findings in the report. MR ABDOMEN W WO CONTRAST  Result Date: 08/07/2021 CLINICAL DATA:  Further characterization of known left adrenal lesion. EXAM: MRI ABDOMEN WITHOUT AND WITH CONTRAST TECHNIQUE: Multiplanar multisequence MR imaging of the abdomen was performed both before and after the administration of intravenous contrast. CONTRAST:  13m GADAVIST GADOBUTROL 1 MMOL/ML IV SOLN COMPARISON:  Multiple priors including most recent PET-CT June 10, 2021 and most remote CT December 12, 2020 FINDINGS: Lower chest: No acute abnormality. Hepatobiliary: Hepatomegaly measuring 23.8 cm in maximum craniocaudal dimension. Mild loss of signal in the hepatic parenchyma on out of phase imaging consistent with hepatic steatosis. Well-circumscribed fluid intensity cyst in the left lobe of the liver measures 3.2 cm. No solid enhancing hepatic lesion. Gallbladder is unremarkable. No biliary ductal dilation. Pancreas: No pancreatic ductal dilation. Intrinsic T1  signal of the pancreatic parenchyma is within normal limits. Pancreas demonstrates homogeneous enhancement postcontrast administration. No cystic or solid hyperenhancing pancreatic lesion identified. Spleen:  No splenomegaly. Adrenals/Urinary Tract: Solid enhancing left adrenal lesion measures 2.7 cm on image 31/12, which does not demonstrate loss of signal on out of phase imaging. Right adrenal gland is unremarkable. No hydronephrosis. Nonenhancing benign 15 mm Bosniak classification 2 left interpolar renal cyst is benign and does not require independent follow-up. No suspicious renal mass. Stomach/Bowel: Colonic diverticulosis without findings of acute diverticulitis. No bowel obstruction. Vascular/Lymphatic: Normal caliber abdominal aorta. No pathologically enlarged abdominal lymph nodes. Other:  No significant abdominal free fluid. Musculoskeletal: No suspicious bone lesions identified. IMPRESSION: 1. Stable solid enhancing 2.7 cm left adrenal lesion, which was hypermetabolic on prior PET/CTs but is unchanged in size dating back to December 12, 2020. 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 However, unfortunately metastatic disease can not be entirely excluded and remains a pertinent differential consideration. Comparison with more remote prior imaging would be the most valuable tool in the assessment of this lesion, as demonstrating long-term stability would indicate this to  be a benign lesion. However, if no prior imaging can be made available, would consider follow-up adrenal protocol CT with and without intravenous contrast material in 3 months as this would allow for further assessment of stability as well as enhancement and washout characteristics of the lesion potentially allowing for better characterization versus direct tissue  sampling. 2. Hepatomegaly and hepatic steatosis. 3. Colonic diverticulosis without findings of acute diverticulitis. Electronically Signed   By: Dahlia Bailiff M.D.   On: 08/07/2021 07:06   IR IMAGING GUIDED PORT INSERTION  Result Date: 08/09/2021 INDICATION: Chemotherapy EXAM: Placement of right-sided chest port using ultrasound and fluoroscopic guidance MEDICATIONS: Per EMR ANESTHESIA/SEDATION: Moderate (conscious) sedation was employed during this procedure. A total of Versed 1.5 mg and Fentanyl 75 mcg was administered intravenously. Moderate Sedation Time: 17 minutes. The patient's level of consciousness and vital signs were monitored continuously by radiology nursing throughout the procedure under my direct supervision. FLUOROSCOPY TIME:  Fluoroscopy Time: 0.5 minutes (8 mGy) COMPLICATIONS: None immediate. PROCEDURE: Informed written consent was obtained from the patient after a thorough discussion of the procedural risks, benefits and alternatives. All questions were addressed. Maximal Sterile Barrier Technique was utilized including caps, mask, sterile gowns, sterile gloves, sterile drape, hand hygiene and skin antiseptic. A timeout was performed prior to the initiation of the procedure. The patient was placed supine on the exam table. The right neck and chest was prepped and draped in the standard sterile fashion. A preliminary ultrasound of the right neck was performed and demonstrates a patent right internal jugular vein. A permanent ultrasound image was stored in the electronic medical record. The overlying skin was anesthetized with 1% Lidocaine. Using ultrasound guidance, access was obtained into the right internal jugular vein using a 21 gauge micropuncture set. A wire was advanced into the SVC, a short incision was made at the puncture site, and serial dilatation performed. Next, in an ipsilateral infraclavicular location, an incision was made at the site of the subcutaneous reservoir. Blunt  dissection was used to open a pocket to contain the reservoir. A subcutaneous tunnel was then created from the port site to the puncture site. A(n) 8 Fr single lumen catheter was advanced through the tunnel. The catheter was attached to the port and this was placed in the subcutaneous pocket. Under fluoroscopic guidance, a peel away sheath was placed, and the catheter was trimmed to the appropriate length and was advanced into the central veins. The catheter length is 24 cm. The tip of the catheter lies near the superior cavoatrial junction. The port flushes and aspirates appropriately. The port was flushed and locked with heparinized saline. The port pocket was closed in 2 layers using 3-0 and 4-0 Vicryl/absorbable suture. Dermabond was also applied to both incisions. The patient tolerated the procedure well and was transferred to recovery in stable condition. IMPRESSION: Successful placement of a right-sided chest port via the right internal jugular vein. The port is ready for immediate use. Electronically Signed   By: Albin Felling M.D.   On: 08/09/2021 13:54      ASSESSMENT & PLAN:  1. Endometrial adenocarcinoma ([place])   2. Iron deficiency anemia due to chronic blood loss   3. Adrenal mass ([place])   4. Venous insufficiency of both lower extremities   5. Insulin dependent type 2 diabetes mellitus ([place])   6. Port-A-Cath in place   7. Normocytic anemia   8. Class 3 severe obesity with body mass index (BMI) of 40.0 to 44.9 in adult, unspecified obesity  type, unspecified whether serious comorbidity present [facility])    Cancer Staging  Endometrial adenocarcinoma [facility]) Staging form: Corpus Uteri - Carcinoma and Carcinosarcoma, AJCC 8th Edition - Clinical stage from 04/10/2021: Stage Unknown (cTX, cNX, cM0) - Signed by Earlie Server, MD on 04/14/2021   #Endometrial carcinoma, FIGO III Recommend chemotherapy with carboplatin and Taxol for 3 cycles, repeat imaging and possibly follow-up with another cycles of  chemotherapy.  Rationale and potential side effects of chemotherapy were reviewed and discussed with patient Labs reviewed.  Proceed with cycle 1 carboplatin AUC of 5, Taxol 135 mg/m2 -dose reduction due to performance status, recent pelvic radiation, other disease comorbidities.  If she tolerates, may titrate up on the dose. Patient will receive day 4 Udenyca support.  Recommend Claritin 10 mg daily for 4 days.  #Left adrenal mass,  lipid poor hyperfunctioning adenoma vs mets.   FDG avid on PET scan. Discussed with IR for possible biopsy however procedure is difficult due to patient's body habitus and deep position of the mass. 08/05/2021, MRI abdomen with and without contrast showed stable solid enhancing 2.7 cm left adrenal lesion, imaging characteristics nonspecific, given its relative stability since September 2022 and the relative rarity of the isolated adrenal metastasis, this is favored to reflect a lipid poor adenoma.  Metastasis cannot be excluded. Continue observation.  #Normocytic anemia, improved.   #Vitamin B12 deficiency, B12 level has improved.  Continue oral vitamin B12 supplementation. #Chronic bilateral lower extremity swelling, skin changes due to chronic venous insufficiency.  Recommend compression stocking. #Hepatomegaly, she has history of fatty liver disease.  # Hyperglycemia, due to DM.  Obesity, discussed about lifestyle modification, healthy diet and exercise.  Refer to nutritionist   All questions were answered. The patient knows to call the clinic with any problems questions or concerns.  cc Tracie Harrier, MD    Return of visit:  Carbo Taxol 6/2 1 week lab MD IVF 3 weeks Lab MD carboplatin Taxol - D4 Udenyca.   Thank you for this kind referral and the opportunity to participate in the care of this patient. A copy of today's note is routed to referring provider   Earlie Server, MD, [name] [facility] Hematology Oncology 08/15/2021

## 2021-08-16 ENCOUNTER — Inpatient Hospital Stay: Payer: Medicare PPO

## 2021-08-16 VITALS — BP 140/81 | HR 72 | Temp 96.7°F | Resp 18

## 2021-08-16 DIAGNOSIS — C541 Malignant neoplasm of endometrium: Secondary | ICD-10-CM

## 2021-08-16 DIAGNOSIS — Z5111 Encounter for antineoplastic chemotherapy: Secondary | ICD-10-CM | POA: Diagnosis not present

## 2021-08-16 MED ORDER — SODIUM CHLORIDE 0.9% FLUSH
10.0000 mL | INTRAVENOUS | Status: DC | PRN
  Filled 2021-08-16: qty 10

## 2021-08-16 MED ORDER — SODIUM CHLORIDE 0.9 % IV SOLN
150.0000 mg | Freq: Once | INTRAVENOUS | Status: AC
  Administered 2021-08-16: 150 mg via INTRAVENOUS
  Filled 2021-08-16: qty 150

## 2021-08-16 MED ORDER — SODIUM CHLORIDE 0.9 % IV SOLN
135.0000 mg/m2 | Freq: Once | INTRAVENOUS | Status: AC
  Administered 2021-08-16: 336 mg via INTRAVENOUS
  Filled 2021-08-16: qty 56

## 2021-08-16 MED ORDER — SODIUM CHLORIDE 0.9 % IV SOLN
652.0000 mg | Freq: Once | INTRAVENOUS | Status: AC
  Administered 2021-08-16: 650 mg via INTRAVENOUS
  Filled 2021-08-16: qty 65

## 2021-08-16 MED ORDER — DIPHENHYDRAMINE HCL 50 MG/ML IJ SOLN
50.0000 mg | Freq: Once | INTRAMUSCULAR | Status: AC
  Administered 2021-08-16: 50 mg via INTRAVENOUS
  Filled 2021-08-16: qty 1

## 2021-08-16 MED ORDER — HEPARIN SOD (PORK) LOCK FLUSH 100 UNIT/ML IV SOLN
500.0000 [IU] | Freq: Once | INTRAVENOUS | Status: AC | PRN
  Administered 2021-08-16: 500 [IU]
  Filled 2021-08-16: qty 5

## 2021-08-16 MED ORDER — PALONOSETRON HCL INJECTION 0.25 MG/5ML
0.2500 mg | Freq: Once | INTRAVENOUS | Status: AC
  Administered 2021-08-16: 0.25 mg via INTRAVENOUS
  Filled 2021-08-16: qty 5

## 2021-08-16 MED ORDER — SODIUM CHLORIDE 0.9 % IV SOLN
Freq: Once | INTRAVENOUS | Status: AC
  Filled 2021-08-16: qty 250

## 2021-08-16 MED ORDER — FAMOTIDINE IN NACL 20-0.9 MG/50ML-% IV SOLN
20.0000 mg | Freq: Once | INTRAVENOUS | Status: AC
  Administered 2021-08-16: 20 mg via INTRAVENOUS
  Filled 2021-08-16: qty 50

## 2021-08-16 MED ORDER — SODIUM CHLORIDE 0.9 % IV SOLN
10.0000 mg | Freq: Once | INTRAVENOUS | Status: AC
  Administered 2021-08-16: 10 mg via INTRAVENOUS
  Filled 2021-08-16: qty 10

## 2021-08-16 NOTE — Patient Instructions (Signed)
[facility] AT [place]  Discharge Instructions: Thank you for choosing [facility] to provide your oncology and hematology care.  If you have a lab appointment with the [facility], please go directly to the [facility] and check in at the registration area.  Wear comfortable clothing and clothing appropriate for easy access to any Portacath or PICC line.   We strive to give you quality time with your provider. You may need to reschedule your appointment if you arrive late (15 or more minutes).  Arriving late affects you and other patients whose appointments are after yours.  Also, if you miss three or more appointments without notifying the office, you may be dismissed from the clinic at the provider's discretion.      For prescription refill requests, have your pharmacy contact our office and allow 72 hours for refills to be completed.    Today you received the following chemotherapy and/or immunotherapy agents TAXOL and CARBOPALTIN      To help prevent nausea and vomiting after your treatment, we encourage you to take your nausea medication as directed.  BELOW ARE SYMPTOMS THAT SHOULD BE REPORTED IMMEDIATELY: *FEVER GREATER THAN 100.4 F (38 C) OR HIGHER *CHILLS OR SWEATING *NAUSEA AND VOMITING THAT IS NOT CONTROLLED WITH YOUR NAUSEA MEDICATION *UNUSUAL SHORTNESS OF BREATH *UNUSUAL BRUISING OR BLEEDING *URINARY PROBLEMS (pain or burning when urinating, or frequent urination) *BOWEL PROBLEMS (unusual diarrhea, constipation, pain near the anus) TENDERNESS IN MOUTH AND THROAT WITH OR WITHOUT PRESENCE OF ULCERS (sore throat, sores in mouth, or a toothache) UNUSUAL RASH, SWELLING OR PAIN  UNUSUAL VAGINAL DISCHARGE OR ITCHING   Items with * indicate a potential emergency and should be followed up as soon as possible or go to the Emergency Department if any problems should occur.  Please show the CHEMOTHERAPY ALERT CARD or IMMUNOTHERAPY ALERT CARD at  check-in to the Emergency Department and triage nurse.  Should you have questions after your visit or need to cancel or reschedule your appointment, please contact [facility] [place] AT [place]  [phone] and follow the prompts.  Office hours are 8:00 a.m. to 4:30 p.m. Monday - Friday. Please note that voicemails left after 4:00 p.m. may not be returned until the following business day.  We are closed weekends and major holidays. You have access to a nurse at all times for urgent questions. Please call the main number to the clinic [phone] and follow the prompts.  For any non-urgent questions, you may also contact your provider using MyChart. We now offer e-Visits for anyone 42 and older to request care online for non-urgent symptoms. For details visit [email or web address].   Also download the MyChart app! Go to the app store, search "MyChart", open the app, select [facility], and log in with your MyChart username and password.  Due to Covid, a mask is required upon entering the hospital/clinic. If you do not have a mask, one will be given to you upon arrival. For doctor visits, patients may have 1 support person aged 92 or older with them. For treatment visits, patients cannot have anyone with them due to current Covid guidelines and our immunocompromised population.   Paclitaxel injection What is this medication? PACLITAXEL (PAK li TAX el) is a chemotherapy drug. It targets fast dividing cells, like cancer cells, and causes these cells to die. This medicine is used to treat ovarian cancer, breast cancer, lung cancer, Kaposi's sarcoma, and other cancers. This medicine may be used for other  purposes; ask your health care provider or pharmacist if you have questions. COMMON BRAND NAME(S): Onxol, Taxol What should I tell my care team before I take this medication? They need to know if you have any of these conditions: history of irregular heartbeat liver disease low blood  counts, like low white cell, platelet, or red cell counts lung or breathing disease, like asthma tingling of the fingers or toes, or other nerve disorder an unusual or allergic reaction to paclitaxel, alcohol, polyoxyethylated castor oil, other chemotherapy, other medicines, foods, dyes, or preservatives pregnant or trying to get pregnant breast-feeding How should I use this medication? This drug is given as an infusion into a vein. It is administered in a hospital or clinic by a specially trained health care professional. Talk to your pediatrician regarding the use of this medicine in children. Special care may be needed. Overdosage: If you think you have taken too much of this medicine contact a poison control center or emergency room at once. NOTE: This medicine is only for you. Do not share this medicine with others. What if I miss a dose? It is important not to miss your dose. Call your doctor or health care professional if you are unable to keep an appointment. What may interact with this medication? Do not take this medicine with any of the following medications: live virus vaccines This medicine may also interact with the following medications: antiviral medicines for hepatitis, HIV or AIDS certain antibiotics like erythromycin and clarithromycin certain medicines for fungal infections like ketoconazole and itraconazole certain medicines for seizures like carbamazepine, phenobarbital, phenytoin gemfibrozil nefazodone rifampin St. John's wort This list may not describe all possible interactions. Give your health care provider a list of all the medicines, herbs, non-prescription drugs, or dietary supplements you use. Also tell them if you smoke, drink alcohol, or use illegal drugs. Some items may interact with your medicine. What should I watch for while using this medication? Your condition will be monitored carefully while you are receiving this medicine. You will need important  blood work done while you are taking this medicine. This medicine can cause serious allergic reactions. To reduce your risk you will need to take other medicine(s) before treatment with this medicine. If you experience allergic reactions like skin rash, itching or hives, swelling of the face, lips, or tongue, tell your doctor or health care professional right away. In some cases, you may be given additional medicines to help with side effects. Follow all directions for their use. This drug may make you feel generally unwell. This is not uncommon, as chemotherapy can affect healthy cells as well as cancer cells. Report any side effects. Continue your course of treatment even though you feel ill unless your doctor tells you to stop. Call your doctor or health care professional for advice if you get a fever, chills or sore throat, or other symptoms of a cold or flu. Do not treat yourself. This drug decreases your body's ability to fight infections. Try to avoid being around people who are sick. This medicine may increase your risk to bruise or bleed. Call your doctor or health care professional if you notice any unusual bleeding. Be careful brushing and flossing your teeth or using a toothpick because you may get an infection or bleed more easily. If you have any dental work done, tell your dentist you are receiving this medicine. Avoid taking products that contain aspirin, acetaminophen, ibuprofen, naproxen, or ketoprofen unless instructed by your doctor. These medicines may   hide a fever. Do not become pregnant while taking this medicine. Women should inform their doctor if they wish to become pregnant or think they might be pregnant. There is a potential for serious side effects to an unborn child. Talk to your health care professional or pharmacist for more information. Do not breast-feed an infant while taking this medicine. Men are advised not to father a child while receiving this medicine. This product  may contain alcohol. Ask your pharmacist or healthcare provider if this medicine contains alcohol. Be sure to tell all healthcare providers you are taking this medicine. Certain medicines, like metronidazole and disulfiram, can cause an unpleasant reaction when taken with alcohol. The reaction includes flushing, headache, nausea, vomiting, sweating, and increased thirst. The reaction can last from 30 minutes to several hours. What side effects may I notice from receiving this medication? Side effects that you should report to your doctor or health care professional as soon as possible: allergic reactions like skin rash, itching or hives, swelling of the face, lips, or tongue breathing problems changes in vision fast, irregular heartbeat high or low blood pressure mouth sores pain, tingling, numbness in the hands or feet signs of decreased platelets or bleeding - bruising, pinpoint red spots on the skin, black, tarry stools, blood in the urine signs of decreased red blood cells - unusually weak or tired, feeling faint or lightheaded, falls signs of infection - fever or chills, cough, sore throat, pain or difficulty passing urine signs and symptoms of liver injury like dark yellow or brown urine; general ill feeling or flu-like symptoms; light-colored stools; loss of appetite; nausea; right upper belly pain; unusually weak or tired; yellowing of the eyes or skin swelling of the ankles, feet, hands unusually slow heartbeat Side effects that usually do not require medical attention (report to your doctor or health care professional if they continue or are bothersome): diarrhea hair loss loss of appetite muscle or joint pain nausea, vomiting pain, redness, or irritation at site where injected tiredness This list may not describe all possible side effects. Call your doctor for medical advice about side effects. You may report side effects to FDA at 1-800-FDA-1088. Where should I keep my  medication? This drug is given in a hospital or clinic and will not be stored at home. NOTE: This sheet is a summary. It may not cover all possible information. If you have questions about this medicine, talk to your doctor, pharmacist, or health care provider.  2023 Elsevier/Gold Standard (2021-02-01 00:00:00)  Carboplatin injection What is this medication? CARBOPLATIN (KAR boe pla tin) is a chemotherapy drug. It targets fast dividing cells, like cancer cells, and causes these cells to die. This medicine is used to treat ovarian cancer and many other cancers. This medicine may be used for other purposes; ask your health care provider or pharmacist if you have questions. COMMON BRAND NAME(S): Paraplatin What should I tell my care team before I take this medication? They need to know if you have any of these conditions: blood disorders hearing problems kidney disease recent or ongoing radiation therapy an unusual or allergic reaction to carboplatin, cisplatin, other chemotherapy, other medicines, foods, dyes, or preservatives pregnant or trying to get pregnant breast-feeding How should I use this medication? This drug is usually given as an infusion into a vein. It is administered in a hospital or clinic by a specially trained health care professional. Talk to your pediatrician regarding the use of this medicine in children. Special care may be   needed. Overdosage: If you think you have taken too much of this medicine contact a poison control center or emergency room at once. NOTE: This medicine is only for you. Do not share this medicine with others. What if I miss a dose? It is important not to miss a dose. Call your doctor or health care professional if you are unable to keep an appointment. What may interact with this medication? medicines for seizures medicines to increase blood counts like filgrastim, pegfilgrastim, sargramostim some antibiotics like amikacin, gentamicin, neomycin,  streptomycin, tobramycin vaccines Talk to your doctor or health care professional before taking any of these medicines: acetaminophen aspirin ibuprofen ketoprofen naproxen This list may not describe all possible interactions. Give your health care provider a list of all the medicines, herbs, non-prescription drugs, or dietary supplements you use. Also tell them if you smoke, drink alcohol, or use illegal drugs. Some items may interact with your medicine. What should I watch for while using this medication? Your condition will be monitored carefully while you are receiving this medicine. You will need important blood work done while you are taking this medicine. This drug may make you feel generally unwell. This is not uncommon, as chemotherapy can affect healthy cells as well as cancer cells. Report any side effects. Continue your course of treatment even though you feel ill unless your doctor tells you to stop. In some cases, you may be given additional medicines to help with side effects. Follow all directions for their use. Call your doctor or health care professional for advice if you get a fever, chills or sore throat, or other symptoms of a cold or flu. Do not treat yourself. This drug decreases your body's ability to fight infections. Try to avoid being around people who are sick. This medicine may increase your risk to bruise or bleed. Call your doctor or health care professional if you notice any unusual bleeding. Be careful brushing and flossing your teeth or using a toothpick because you may get an infection or bleed more easily. If you have any dental work done, tell your dentist you are receiving this medicine. Avoid taking products that contain aspirin, acetaminophen, ibuprofen, naproxen, or ketoprofen unless instructed by your doctor. These medicines may hide a fever. Do not become pregnant while taking this medicine. Women should inform their doctor if they wish to become pregnant or  think they might be pregnant. There is a potential for serious side effects to an unborn child. Talk to your health care professional or pharmacist for more information. Do not breast-feed an infant while taking this medicine. What side effects may I notice from receiving this medication? Side effects that you should report to your doctor or health care professional as soon as possible: allergic reactions like skin rash, itching or hives, swelling of the face, lips, or tongue signs of infection - fever or chills, cough, sore throat, pain or difficulty passing urine signs of decreased platelets or bleeding - bruising, pinpoint red spots on the skin, black, tarry stools, nosebleeds signs of decreased red blood cells - unusually weak or tired, fainting spells, lightheadedness breathing problems changes in hearing changes in vision chest pain high blood pressure low blood counts - This drug may decrease the number of white blood cells, red blood cells and platelets. You may be at increased risk for infections and bleeding. nausea and vomiting pain, swelling, redness or irritation at the injection site pain, tingling, numbness in the hands or feet problems with balance, talking, walking  trouble passing urine or change in the amount of urine Side effects that usually do not require medical attention (report to your doctor or health care professional if they continue or are bothersome): hair loss loss of appetite metallic taste in the mouth or changes in taste This list may not describe all possible side effects. Call your doctor for medical advice about side effects. You may report side effects to FDA at 1-800-FDA-1088. Where should I keep my medication? This drug is given in a hospital or clinic and will not be stored at home. NOTE: This sheet is a summary. It may not cover all possible information. If you have questions about this medicine, talk to your doctor, pharmacist, or health care  provider.  2023 Elsevier/Gold Standard (2007-08-11 00:00:00)   

## 2021-08-19 ENCOUNTER — Inpatient Hospital Stay: Payer: Medicare PPO | Attending: Oncology

## 2021-08-19 ENCOUNTER — Telehealth: Payer: Self-pay

## 2021-08-19 DIAGNOSIS — Z79899 Other long term (current) drug therapy: Secondary | ICD-10-CM | POA: Insufficient documentation

## 2021-08-19 DIAGNOSIS — C541 Malignant neoplasm of endometrium: Secondary | ICD-10-CM | POA: Diagnosis present

## 2021-08-19 DIAGNOSIS — E538 Deficiency of other specified B group vitamins: Secondary | ICD-10-CM | POA: Diagnosis not present

## 2021-08-19 DIAGNOSIS — Z5189 Encounter for other specified aftercare: Secondary | ICD-10-CM | POA: Diagnosis not present

## 2021-08-19 DIAGNOSIS — D649 Anemia, unspecified: Secondary | ICD-10-CM | POA: Diagnosis not present

## 2021-08-19 MED ORDER — PEGFILGRASTIM-CBQV 6 MG/0.6ML ~~LOC~~ SOSY
6.0000 mg | PREFILLED_SYRINGE | Freq: Once | SUBCUTANEOUS | Status: AC
  Administered 2021-08-19: 6 mg via SUBCUTANEOUS
  Filled 2021-08-19: qty 0.6

## 2021-08-19 NOTE — Telephone Encounter (Signed)
Telephone call to patient for follow up after receiving first infusion.   Patient states infusion went great but long.  States eating fair and drinking plenty of fluids.   Had one episode of nausea but relieve with antinausea meds, no vomiting.  Encouraged patient to call for any concerns or questions.

## 2021-08-20 ENCOUNTER — Other Ambulatory Visit: Payer: Self-pay | Admitting: Internal Medicine

## 2021-08-20 ENCOUNTER — Encounter: Payer: Self-pay | Admitting: Oncology

## 2021-08-20 DIAGNOSIS — Z1231 Encounter for screening mammogram for malignant neoplasm of breast: Secondary | ICD-10-CM

## 2021-08-20 DIAGNOSIS — N644 Mastodynia: Secondary | ICD-10-CM

## 2021-08-21 ENCOUNTER — Encounter: Payer: Self-pay | Admitting: Oncology

## 2021-08-21 ENCOUNTER — Inpatient Hospital Stay: Payer: Medicare PPO

## 2021-08-21 NOTE — Progress Notes (Signed)
Nutrition Assessment:  Referral for DM education and weight loss.   71 year old female with anemia and endometrial cancer.  Followed by Dr Tasia Catchings.  Past medical history of depression, DM, HTN.  Patient received radiation 04/24/21-05/29/21.  Patient receiving carboplatin and taxol.    Met with patient this am.  Says that she was nauseated this morning. Did take nausea medication after she ate breakfast.  Reports issues with pain and constipation.  Has been trying to drink protein shakes. Ate frozen breakfast biscuit this am.  Has been eating protein bars and drinking diet gingerale and water.  Patient reports poor appetite.  Unsure what she can eat.      Medications: jardiance, ferrous sulfate, ativan, zofran, compazine, metformin, 70/30 insulin, ozempic  Labs: glucose 255  Anthropometrics:   Height: 68 inches Weight: 289 lb on 6/1 292 lb on 01/14/21 BMI: 43  1% weight loss in the last 7 months.  Patient reports 30 lb weight loss in the last 5 months.     NUTRITION DIAGNOSIS: Inadequate oral intake related to cancer related treatment as evidenced by nausea, poor appetite, pain.     INTERVENTION:  Encouraged taking nausea medications to help control symptom and allow patient to eat.  Discussed foods to choose with nausea. Handout provided Encouraged small frequent meals Encouraged protein foods and sugar free options. Patient planning on starting miralax today to help with constipation.  Encouraged patient to discuss with MD on 6/9 if not working.  Encouraged fluids. Contact information provided Offered support for LCSW as patient with reports of dealing with up and down emotions with cancer diagnosis.  Declined for now but knows this service is an option    MONITORING, EVALUATION, GOAL: weight trends, intake   NEXT VISIT: Friday, June 23rd during infusion  Shahab Polhamus B. Zenia Resides, [place], [place] Registered Dietitian [phone]

## 2021-08-21 NOTE — Telephone Encounter (Signed)
Please advise 

## 2021-08-23 ENCOUNTER — Inpatient Hospital Stay: Payer: Medicare PPO

## 2021-08-23 ENCOUNTER — Inpatient Hospital Stay (HOSPITAL_BASED_OUTPATIENT_CLINIC_OR_DEPARTMENT_OTHER): Payer: Medicare PPO | Admitting: Oncology

## 2021-08-23 ENCOUNTER — Encounter: Payer: Self-pay | Admitting: Oncology

## 2021-08-23 VITALS — BP 148/66 | HR 94 | Temp 98.7°F | Resp 20 | Wt 276.8 lb

## 2021-08-23 DIAGNOSIS — T451X5A Adverse effect of antineoplastic and immunosuppressive drugs, initial encounter: Secondary | ICD-10-CM

## 2021-08-23 DIAGNOSIS — I872 Venous insufficiency (chronic) (peripheral): Secondary | ICD-10-CM

## 2021-08-23 DIAGNOSIS — D6481 Anemia due to antineoplastic chemotherapy: Secondary | ICD-10-CM

## 2021-08-23 DIAGNOSIS — C541 Malignant neoplasm of endometrium: Secondary | ICD-10-CM

## 2021-08-23 DIAGNOSIS — E538 Deficiency of other specified B group vitamins: Secondary | ICD-10-CM

## 2021-08-23 DIAGNOSIS — Z6841 Body Mass Index (BMI) 40.0 and over, adult: Secondary | ICD-10-CM

## 2021-08-23 DIAGNOSIS — K5903 Drug induced constipation: Secondary | ICD-10-CM | POA: Diagnosis not present

## 2021-08-23 DIAGNOSIS — Z5111 Encounter for antineoplastic chemotherapy: Secondary | ICD-10-CM | POA: Diagnosis not present

## 2021-08-23 DIAGNOSIS — E278 Other specified disorders of adrenal gland: Secondary | ICD-10-CM | POA: Diagnosis not present

## 2021-08-23 DIAGNOSIS — Z95828 Presence of other vascular implants and grafts: Secondary | ICD-10-CM

## 2021-08-23 LAB — CBC WITH DIFFERENTIAL/PLATELET
Abs Immature Granulocytes: 1 10*3/uL — ABNORMAL HIGH (ref 0.00–0.07)
Band Neutrophils: 5 %
Basophils Absolute: 0 10*3/uL (ref 0.0–0.1)
Basophils Relative: 0 %
Eosinophils Absolute: 0.2 10*3/uL (ref 0.0–0.5)
Eosinophils Relative: 3 %
HCT: 34.6 % — ABNORMAL LOW (ref 36.0–46.0)
Hemoglobin: 11.1 g/dL — ABNORMAL LOW (ref 12.0–15.0)
Lymphocytes Relative: 14 %
Lymphs Abs: 1.1 10*3/uL (ref 0.7–4.0)
MCH: 29.1 pg (ref 26.0–34.0)
MCHC: 32.1 g/dL (ref 30.0–36.0)
MCV: 90.6 fL (ref 80.0–100.0)
Metamyelocytes Relative: 5 %
Monocytes Absolute: 1.4 10*3/uL — ABNORMAL HIGH (ref 0.1–1.0)
Monocytes Relative: 18 %
Myelocytes: 8 %
Neutro Abs: 4.2 10*3/uL (ref 1.7–7.7)
Neutrophils Relative %: 47 %
Platelets: 203 10*3/uL (ref 150–400)
RBC: 3.82 MIL/uL — ABNORMAL LOW (ref 3.87–5.11)
RDW: 14.9 % (ref 11.5–15.5)
Smear Review: NORMAL
WBC: 8 10*3/uL (ref 4.0–10.5)
nRBC: 0 % (ref 0.0–0.2)

## 2021-08-23 LAB — COMPREHENSIVE METABOLIC PANEL
ALT: 14 U/L (ref 0–44)
AST: 21 U/L (ref 15–41)
Albumin: 3.7 g/dL (ref 3.5–5.0)
Alkaline Phosphatase: 64 U/L (ref 38–126)
Anion gap: 10 (ref 5–15)
BUN: 29 mg/dL — ABNORMAL HIGH (ref 8–23)
CO2: 24 mmol/L (ref 22–32)
Calcium: 8.9 mg/dL (ref 8.9–10.3)
Chloride: 105 mmol/L (ref 98–111)
Creatinine, Ser: 0.91 mg/dL (ref 0.44–1.00)
GFR, Estimated: >60 mL/min (ref >60)
Glucose, Bld: 177 mg/dL — ABNORMAL HIGH (ref 70–99)
Potassium: 3.4 mmol/L — ABNORMAL LOW (ref 3.5–5.1)
Sodium: 139 mmol/L (ref 135–145)
Total Bilirubin: 0.4 mg/dL (ref 0.3–1.2)
Total Protein: 7.1 g/dL (ref 6.5–8.1)

## 2021-08-23 MED ORDER — DOCUSATE SODIUM 100 MG PO CAPS: 100.0000 mg | ORAL_CAPSULE | Freq: Two times a day (BID) | ORAL | 1 refills | Status: AC

## 2021-08-23 MED ORDER — HEPARIN SOD (PORK) LOCK FLUSH 100 UNIT/ML IV SOLN
500.0000 [IU] | Freq: Once | INTRAVENOUS | Status: AC
  Administered 2021-08-23: 500 [IU] via INTRAVENOUS
  Filled 2021-08-23: qty 5

## 2021-08-23 NOTE — Progress Notes (Signed)
Patient states she had chemo last Friday and the Joint pain started on Sunday and continues throughout the week. Patient states she has some irritation down the in vaginal area when urinating. Patient states its only tender when urinating. Denies any burning or itching.

## 2021-08-23 NOTE — Telephone Encounter (Signed)
Pt was seen in clinic today

## 2021-08-24 ENCOUNTER — Encounter: Payer: Self-pay | Admitting: Oncology

## 2021-08-24 DIAGNOSIS — N189 Chronic kidney disease, unspecified: Secondary | ICD-10-CM | POA: Insufficient documentation

## 2021-08-24 DIAGNOSIS — Z5111 Encounter for antineoplastic chemotherapy: Secondary | ICD-10-CM | POA: Insufficient documentation

## 2021-08-24 DIAGNOSIS — D631 Anemia in chronic kidney disease: Secondary | ICD-10-CM | POA: Insufficient documentation

## 2021-08-24 DIAGNOSIS — D6481 Anemia due to antineoplastic chemotherapy: Secondary | ICD-10-CM | POA: Insufficient documentation

## 2021-08-24 NOTE — Assessment & Plan Note (Signed)
B12 level has improved.  Continue oral vitamin B12 supplementation

## 2021-08-24 NOTE — Assessment & Plan Note (Signed)
discussed about lifestyle modification, healthy diet and exercise. Follow up with nutritionist 

## 2021-08-24 NOTE — Progress Notes (Signed)
Hematology/Oncology Progress note [phone] [phone]         Patient Care Team: Valerie Harrier, MD as PCP - General (Internal Medicine)  REFERRING PROVIDER: Tracie Harrier, MD Dr. 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  FDG avid on PET scan.  Difficult biopsy due to patient's body habitus and deep position of the mass. 08/05/2021, MRI abdomen with and without contrast showed stable solid enhancing 2.7 cm left adrenal lesion, imaging characteristics nonspecific, given its relative stability since September 2022 and the relative rarity of the isolated adrenal metastasis, this is favored to reflect a lipid poor adenoma.  Metastasis cannot be excluded. Continue observation.  B12 deficiency B12 level has improved.  Continue oral vitamin B12 supplementation  Drug-induced constipation Recommend Colace 135m 1-2 time daily. Miralax PRN  Endometrial adenocarcinoma (HCC) Recommend chemotherapy with carboplatin and Taxol for 3 cycles, repeat imaging and possibly follow-up with additional cycles of chemotherapy  Encounter for antineoplastic chemotherapy Chemotherapy as planned above  Morbid obesity with body mass index (BMI) of 40.0 to 44.9 in adult ([facility] discussed about lifestyle modification, healthy diet and exercise. Follow up with nutritionist  Anemia due to antineoplastic chemotherapy Hemoglobin is stable. monitor  She tolerates well. Plan to increase Taxol to 1581mm2  Return of visit:  2 weeks Lab MD carboplatin Taxol - D4 Udenyca.    Valerie ServerMD, [name] [facility] 08/23/2021     HISTORY OF PRESENTING ILLNESS:   Valerie Case a  [age].o.  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 Oncology History  [place]  [place]   12/12/2020, CT abdomen pelvis with contrast showed no radiographic evidence of urinary tract neoplasm, calculi, hydronephrosis.  2.6 cm nonspecific left adrenal mass. 02/12/2021 CT hematuria work-up showed small uterine fibroids, no findings to explain hematuria.Hepatomegaly, diverticulosis without evidence of diverticulitis, Coronary artery disease   04/01/2021 -  Hospital Admission   04/01/2021 - 04/03/2021, patient was hospitalized due to symptomatic anemia, hemoglobin 6.9, heavy postmenopausal bleeding with passing large clots.  She also presented with increased creatinine level to 2.58 compared to her baseline level of 0.9 in November 2022.  Patient received IV iron infusion and 2 units of PRBC during the hospital stay..  04/02/2021, iron panel showed iron saturation 37, ferritin 37, TIBC 333-the studies were done after patient received blood transfusion.  At discharge, hemoglobin was 7.7.   04/01/2021 Initial Diagnosis   04/01/2020, endometrial biopsy showed poorly differentiated endometrial adenocarcinoma. Omniseq NGS showed TMB 48.4 mt/mb [high], MSI High, PD-L1-TPS <1%, PIK3CA [id number] for BRAF, HER2, NTRK1 RET.     04/10/2021 Cancer Staging   Staging form: Corpus Uteri - Carcinoma and Carcinosarcoma, AJCC 8th Edition - Clinical stage from 04/10/2021: FIGO Stage III, calculated as Stage Unknown (cT3, cNX, cM0) - Signed by Valerie Case on 08/24/2021 Stage prefix: Initial diagnosis   04/15/2021 Imaging   PET showed 1. Large hypermetabolic endometrial mass consistent with known endometrial cancer. Suspect direct invasion/involvement of the right adnexa. Right pelvic sidewall hypermetabolic adenopathy.2. 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 3. No findings for metastatic disease involving the chest or bonystructures.     04/24/2021 - 05/29/2021 Radiation Therapy   status  post radiation to pelvis   08/16/2021 -  Chemotherapy   Patient is on Treatment Plan : UTERINE Carboplatin AUC 6 / Paclitaxel q21d         INTERVAL HISTORY Valerie Case is a 71 y.o. female who has above history reviewed by me today presents for follow up visit for anemia and endometrial cancer Tolerated chemotherapy cycle 1.Denies fever, chills, nausea, vomiting, diarrhea, chest pain, shortness of breath, abdominal pain, urinary symptoms + arthralgia.   Review of Systems  Constitutional:  Positive for fatigue. Negative for chills and fever.  HENT:   Negative for hearing loss and voice change.   Eyes:  Negative for eye problems.  Respiratory:  Negative for chest tightness and cough.   Cardiovascular:  Negative for chest pain.  Gastrointestinal:  Negative for abdominal distention, abdominal pain and blood in stool.  Endocrine: Negative for hot flashes.  Genitourinary:  Negative for difficulty urinating, frequency and vaginal bleeding.   Musculoskeletal:  Positive for arthralgias.  Skin:  Negative for itching and rash.  Neurological:  Negative for extremity weakness.  Hematological:  Negative for adenopathy.  Psychiatric/Behavioral:  Negative for confusion.     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    SURGICAL HISTORY: Past Surgical History:  Procedure Laterality Date   CESAREAN SECTION  01/23/1979   IR IMAGING GUIDED PORT INSERTION  08/09/2021   TONSILLECTOMY  03/17/1956    SOCIAL HISTORY: Social History   Socioeconomic History   Marital status: Divorced    Spouse name: Not on file   Number of children: Not on file   Years of education: Not on file   Highest education level: Not on file   Occupational History   Not on file  Tobacco Use   Smoking status: Never   Smokeless tobacco: Never  Vaping Use   Vaping Use: Never used  Substance and Sexual Activity   Alcohol use: Not Currently   Drug use: Never   Sexual activity: Not Currently  Other Topics Concern   Not on file  Social History Narrative      Social Determinants of Health   Financial Resource Strain: Not on file  Food Insecurity: Not on file  Transportation Needs: Not on file  Physical Activity: Not on file  Stress: Not on file  Social Connections: Not on file  Intimate Partner Violence: Not on file    FAMILY HISTORY: Family History  Problem Relation Age of Onset   Breast cancer Mother 76   Cancer Mother    Cancer Father    Lung cancer Father    Breast cancer Cousin        maternal side    ALLERGIES:  is allergic to aspirin.  MEDICATIONS:  Current Outpatient Medications  Medication Sig Dispense Refill   acetaminophen (TYLENOL) 650 MG CR tablet Take 1,300 mg by mouth every 8 (eight) hours as needed for pain.     albuterol (VENTOLIN HFA) 108 (90 Base) MCG/ACT inhaler Inhale 2 puffs into the lungs every 6 (six) hours as needed for wheezing or shortness of breath.     Cranberry-Vitamin C-Probiotic (AZO CRANBERRY PO) Take by mouth.     docusate sodium (COLACE) 100 MG capsule Take 1 capsule (100 mg total) by mouth  2 (two) times daily. 60 capsule 1   empagliflozin (JARDIANCE) 25 MG TABS tablet Take 25 mg by mouth daily.     ferrous sulfate 325 (65 FE) MG EC tablet Take 325 mg by mouth daily with breakfast.     FLUoxetine (PROZAC) 10 MG capsule Take 10 mg by mouth daily.     fluticasone (FLONASE) 50 MCG/ACT nasal spray Place 1 spray into both nostrils daily as needed for allergies or rhinitis.     hydrochlorothiazide (HYDRODIURIL) 12.5 MG tablet Take 12.5 mg by mouth daily.     ibuprofen (ADVIL) 200 MG tablet Take 400 mg by mouth every 8 (eight) hours as needed for moderate pain.      lidocaine-prilocaine (EMLA) cream Apply to affected area once 30 g 3   LORazepam (ATIVAN) 0.5 MG tablet Take 1 tablet (0.5 mg total) by mouth every 8 (eight) hours as needed for anxiety (nausea vomiting). 30 tablet 0   metFORMIN (GLUCOPHAGE-XR) 500 MG 24 hr tablet Take 1,000 mg by mouth at bedtime.     metoprolol succinate (TOPROL-XL) 25 MG 24 hr tablet Take 25 mg by mouth daily.     NOVOLIN 70/30 FLEXPEN (70-30) 100 UNIT/ML KwikPen 50-110 Units See admin instructions. 50 units in the morning, 110 units at bedtime     ondansetron (ZOFRAN) 8 MG tablet Take 1 tablet (8 mg total) by mouth 2 (two) times daily as needed. Start on the third day after chemotherapy. 30 tablet 1   OZEMPIC, 1 MG/DOSE, 4 MG/3ML SOPN Inject 1 mg into the skin every Tuesday.     prochlorperazine (COMPAZINE) 10 MG tablet Take 1 tablet (10 mg total) by mouth every 6 (six) hours as needed (Nausea or vomiting). 30 tablet 1   rosuvastatin (CRESTOR) 10 MG tablet Take 10 mg by mouth daily.     lisinopril (ZESTRIL) 40 MG tablet Take 40 mg by mouth at bedtime. (Patient not taking: Reported on 07/31/2021)     No current facility-administered medications for this visit.     PHYSICAL EXAMINATION: ECOG PERFORMANCE STATUS: 1 - Symptomatic but completely ambulatory  Physical Exam Constitutional:      General: She is not in acute distress.    Appearance: She is obese.  HENT:     Head: Normocephalic and atraumatic.  Eyes:     General: No scleral icterus. Cardiovascular:     Rate and Rhythm: Normal rate and regular rhythm.     Heart sounds: Normal heart sounds.  Pulmonary:     Effort: Pulmonary effort is normal. No respiratory distress.     Breath sounds: No wheezing.  Abdominal:     General: Bowel sounds are normal. There is no distension.     Palpations: Abdomen is soft.  Musculoskeletal:        General: No deformity. Normal range of motion.     Cervical back: Normal range of motion and neck supple.     Right lower leg:  Edema present.     Left lower leg: Edema present.     Comments:    Skin:    General: Skin is warm and dry.     Findings: No erythema or rash.     Comments: Chronic venous sufficiency skin changes  Neurological:     Mental Status: She is alert and oriented to person, place, and time. Mental status is at baseline.     Cranial Nerves: No cranial nerve deficit.     Coordination: Coordination normal.  Psychiatric:  Mood and Affect: Mood normal.      LABORATORY DATA:  I have reviewed the data as listed Lab Results  Component Value Date   WBC 8.0 08/23/2021   HGB 11.1 (L) 08/23/2021   HCT 34.6 (L) 08/23/2021   MCV 90.6 08/23/2021   PLT 203 08/23/2021   Recent Labs    04/11/21 1553 05/17/21 1024 07/31/21 1511 08/15/21 0951 08/23/21 1000  NA  --    < > 139 138 139  K  --    < > 4.2 3.9 3.4*  CL  --    < > 103 105 105  CO2  --    < > _0 GLUCOSE  --    < > 151* 255* 177*  BUN  --    < > 24* 28* 29*  CREATININE  --    < > 0.98 0.95 0.91  CALCIUM  --    < > 10.6* 9.8 8.9  GFRNONAA  --    < > >60 >60 >60  PROT  --    < > 7.8 7.6 7.1  ALBUMIN  --    < > 3.9 3.9 3.7  AST  --    < > _1 ALT  --    < > _2 ALKPHOS  --    < > 67 65 64  BILITOT 0.3   < > 0.5 0.3 0.4  BILIDIR <0.1  --   --   --   --   IBILI NOT CALCULATED  --   --   --   --    < > = values in this interval not displayed.    Iron/TIBC/Ferritin/ %Sat    Component Value Date/Time   IRON 53 07/31/2021 1511   IRON 84 06/08/2012 1128   TIBC 357 07/31/2021 1511   TIBC 403 06/08/2012 1128   FERRITIN 44 07/31/2021 1511   FERRITIN 68 06/08/2012 1128   IRONPCTSAT 15 07/31/2021 1511   IRONPCTSAT 21 06/08/2012 1128      RADIOGRAPHIC STUDIES: I have personally reviewed the radiological images as listed and agreed with the findings in the report. IR IMAGING GUIDED PORT INSERTION  Result Date: 08/09/2021 INDICATION: Chemotherapy EXAM: Placement of right-sided chest port using ultrasound  and fluoroscopic guidance MEDICATIONS: Per EMR ANESTHESIA/SEDATION: Moderate (conscious) sedation was employed during this procedure. A total of Versed 1.5 mg and Fentanyl 75 mcg was administered intravenously. Moderate Sedation Time: 17 minutes. The patient's level of consciousness and vital signs were monitored continuously by radiology nursing throughout the procedure under my direct supervision. FLUOROSCOPY TIME:  Fluoroscopy Time: 0.5 minutes (8 mGy) COMPLICATIONS: None immediate. PROCEDURE: Informed written consent was obtained from the patient after a thorough discussion of the procedural risks, benefits and alternatives. All questions were addressed. Maximal Sterile Barrier Technique was utilized including caps, mask, sterile gowns, sterile gloves, sterile drape, hand hygiene and skin antiseptic. A timeout was performed prior to the initiation of the procedure. The patient was placed supine on the exam table. The right neck and chest was prepped and draped in the standard sterile fashion. A preliminary ultrasound of the right neck was performed and demonstrates a patent right internal jugular vein. A permanent ultrasound image was stored in the electronic medical record. The overlying skin was anesthetized with 1% Lidocaine. Using ultrasound guidance, access was obtained into the right internal jugular vein using a 21 gauge micropuncture set. A wire was advanced into the SVC, a short  incision was made at the puncture site, and serial dilatation performed. Next, in an ipsilateral infraclavicular location, an incision was made at the site of the subcutaneous reservoir. Blunt dissection was used to open a pocket to contain the reservoir. A subcutaneous tunnel was then created from the port site to the puncture site. A(n) 8 Fr single lumen catheter was advanced through the tunnel. The catheter was attached to the port and this was placed in the subcutaneous pocket. Under fluoroscopic guidance, a peel away sheath  was placed, and the catheter was trimmed to the appropriate length and was advanced into the central veins. The catheter length is 24 cm. The tip of the catheter lies near the superior cavoatrial junction. The port flushes and aspirates appropriately. The port was flushed and locked with heparinized saline. The port pocket was closed in 2 layers using 3-0 and 4-0 Vicryl/absorbable suture. Dermabond was also applied to both incisions. The patient tolerated the procedure well and was transferred to recovery in stable condition. IMPRESSION: Successful placement of a right-sided chest port via the right internal jugular vein. The port is ready for immediate use. Electronically Signed   By: Albin Felling M.D.   On: 08/09/2021 13:54   MR ABDOMEN W WO CONTRAST  Result Date: 08/07/2021 CLINICAL DATA:  Further characterization of known left adrenal lesion. EXAM: MRI ABDOMEN WITHOUT AND WITH CONTRAST TECHNIQUE: Multiplanar multisequence MR imaging of the abdomen was performed both before and after the administration of intravenous contrast. CONTRAST:  84m GADAVIST GADOBUTROL 1 MMOL/ML IV SOLN COMPARISON:  Multiple priors including most recent PET-CT June 10, 2021 and most remote CT December 12, 2020 FINDINGS: Lower chest: No acute abnormality. Hepatobiliary: Hepatomegaly measuring 23.8 cm in maximum craniocaudal dimension. Mild loss of signal in the hepatic parenchyma on out of phase imaging consistent with hepatic steatosis. Well-circumscribed fluid intensity cyst in the left lobe of the liver measures 3.2 cm. No solid enhancing hepatic lesion. Gallbladder is unremarkable. No biliary ductal dilation. Pancreas: No pancreatic ductal dilation. Intrinsic T1 signal of the pancreatic parenchyma is within normal limits. Pancreas demonstrates homogeneous enhancement postcontrast administration. No cystic or solid hyperenhancing pancreatic lesion identified. Spleen:  No splenomegaly. Adrenals/Urinary Tract: Solid enhancing  left adrenal lesion measures 2.7 cm on image 31/12, which does not demonstrate loss of signal on out of phase imaging. Right adrenal gland is unremarkable. No hydronephrosis. Nonenhancing benign 15 mm Bosniak classification 2 left interpolar renal cyst is benign and does not require independent follow-up. No suspicious renal mass. Stomach/Bowel: Colonic diverticulosis without findings of acute diverticulitis. No bowel obstruction. Vascular/Lymphatic: Normal caliber abdominal aorta. No pathologically enlarged abdominal lymph nodes. Other:  No significant abdominal free fluid. Musculoskeletal: No suspicious bone lesions identified. IMPRESSION: 1. Stable solid enhancing 2.7 cm left adrenal lesion, which was hypermetabolic on prior PET/CTs but is unchanged in size dating back to December 12, 2020. 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 However, unfortunately metastatic disease can not be entirely excluded and remains a pertinent differential consideration. Comparison with more remote prior imaging would be the most valuable tool in the assessment of this lesion, as demonstrating long-term stability would indicate this to be a benign lesion. However, if no prior imaging can be made available, would consider follow-up adrenal protocol CT with and without intravenous contrast material in 3 months as this would allow for further assessment of  stability as well as enhancement and washout characteristics of the lesion potentially allowing for better characterization versus direct tissue sampling. 2. Hepatomegaly and hepatic steatosis. 3. Colonic diverticulosis without findings of acute diverticulitis. Electronically Signed   By: Dahlia Bailiff M.D.   On: 08/07/2021 07:06     cc Valerie Harrier, MD

## 2021-08-24 NOTE — Assessment & Plan Note (Signed)
lipid poor hyperfunctioning adenoma vs mets.  FDG avid on PET scan.  Difficult biopsy due to patient's body habitus and deep position of the mass. 08/05/2021, MRI abdomen with and without contrast showed stable solid enhancing 2.7 cm left adrenal lesion, imaging characteristics nonspecific, given its relative stability since September 2022 and the relative rarity of the isolated adrenal metastasis, this is favored to reflect a lipid poor adenoma.  Metastasis cannot be excluded. Continue observation.

## 2021-08-24 NOTE — Assessment & Plan Note (Addendum)
Recommend chemotherapy with carboplatin and Taxol for 3 cycles, repeat imaging and possibly follow-up with additional cycles of chemotherapy

## 2021-08-24 NOTE — Assessment & Plan Note (Signed)
Chemotherapy as planned above 

## 2021-08-24 NOTE — Assessment & Plan Note (Signed)
Hemoglobin is stable. monitor 

## 2021-08-24 NOTE — Assessment & Plan Note (Signed)
Recommend Colace '100mg'$  1-2 time daily. Miralax PRN

## 2021-09-04 ENCOUNTER — Ambulatory Visit
Admission: RE | Admit: 2021-09-04 | Discharge: 2021-09-04 | Disposition: A | Discharge status: Home / Self Care | Payer: Medicare PPO | Source: Ambulatory Visit | Attending: Internal Medicine | Admitting: Internal Medicine

## 2021-09-04 DIAGNOSIS — Z1231 Encounter for screening mammogram for malignant neoplasm of breast: Secondary | ICD-10-CM

## 2021-09-04 DIAGNOSIS — N644 Mastodynia: Secondary | ICD-10-CM | POA: Diagnosis present

## 2021-09-06 ENCOUNTER — Inpatient Hospital Stay: Payer: Medicare PPO

## 2021-09-06 ENCOUNTER — Encounter: Payer: Self-pay | Admitting: Oncology

## 2021-09-06 ENCOUNTER — Inpatient Hospital Stay: Payer: Medicare PPO | Admitting: Oncology

## 2021-09-06 DIAGNOSIS — E278 Other specified disorders of adrenal gland: Secondary | ICD-10-CM | POA: Diagnosis not present

## 2021-09-06 DIAGNOSIS — C541 Malignant neoplasm of endometrium: Secondary | ICD-10-CM | POA: Diagnosis not present

## 2021-09-06 DIAGNOSIS — Z5111 Encounter for antineoplastic chemotherapy: Secondary | ICD-10-CM | POA: Diagnosis not present

## 2021-09-06 DIAGNOSIS — E538 Deficiency of other specified B group vitamins: Secondary | ICD-10-CM | POA: Diagnosis not present

## 2021-09-06 DIAGNOSIS — K5903 Drug induced constipation: Secondary | ICD-10-CM

## 2021-09-06 LAB — COMPREHENSIVE METABOLIC PANEL
ALT: 19 U/L (ref 0–44)
AST: 24 U/L (ref 15–41)
Albumin: 3.8 g/dL (ref 3.5–5.0)
Alkaline Phosphatase: 61 U/L (ref 38–126)
Anion gap: 10 (ref 5–15)
BUN: 21 mg/dL (ref 8–23)
CO2: 24 mmol/L (ref 22–32)
Calcium: 9 mg/dL (ref 8.9–10.3)
Chloride: 105 mmol/L (ref 98–111)
Creatinine, Ser: 0.94 mg/dL (ref 0.44–1.00)
GFR, Estimated: >60 mL/min (ref >60)
Glucose, Bld: 232 mg/dL — ABNORMAL HIGH (ref 70–99)
Potassium: 3.8 mmol/L (ref 3.5–5.1)
Sodium: 139 mmol/L (ref 135–145)
Total Bilirubin: 0.4 mg/dL (ref 0.3–1.2)
Total Protein: 7.1 g/dL (ref 6.5–8.1)

## 2021-09-06 LAB — CBC WITH DIFFERENTIAL/PLATELET
Abs Immature Granulocytes: 0.04 10*3/uL (ref 0.00–0.07)
Basophils Absolute: 0.1 10*3/uL (ref 0.0–0.1)
Basophils Relative: 1 %
Eosinophils Absolute: 0.2 10*3/uL (ref 0.0–0.5)
Eosinophils Relative: 3 %
HCT: 33.9 % — ABNORMAL LOW (ref 36.0–46.0)
Hemoglobin: 10.8 g/dL — ABNORMAL LOW (ref 12.0–15.0)
Immature Granulocytes: 1 %
Lymphocytes Relative: 12 %
Lymphs Abs: 0.6 10*3/uL — ABNORMAL LOW (ref 0.7–4.0)
MCH: 29.2 pg (ref 26.0–34.0)
MCHC: 31.9 g/dL (ref 30.0–36.0)
MCV: 91.6 fL (ref 80.0–100.0)
Monocytes Absolute: 0.6 10*3/uL (ref 0.1–1.0)
Monocytes Relative: 11 %
Neutro Abs: 3.5 10*3/uL (ref 1.7–7.7)
Neutrophils Relative %: 72 %
Platelets: 255 10*3/uL (ref 150–400)
RBC: 3.7 MIL/uL — ABNORMAL LOW (ref 3.87–5.11)
RDW: 15.7 % — ABNORMAL HIGH (ref 11.5–15.5)
WBC: 4.9 10*3/uL (ref 4.0–10.5)
nRBC: 0 % (ref 0.0–0.2)

## 2021-09-06 MED ORDER — SODIUM CHLORIDE 0.9 % IV SOLN
Freq: Once | INTRAVENOUS | Status: AC
  Filled 2021-09-06: qty 250

## 2021-09-06 MED ORDER — SODIUM CHLORIDE 0.9 % IV SOLN
150.0000 mg/m2 | Freq: Once | INTRAVENOUS | Status: AC
  Administered 2021-09-06: 372 mg via INTRAVENOUS
  Filled 2021-09-06: qty 62

## 2021-09-06 MED ORDER — FAMOTIDINE IN NACL 20-0.9 MG/50ML-% IV SOLN
20.0000 mg | Freq: Once | INTRAVENOUS | Status: AC
  Administered 2021-09-06: 20 mg via INTRAVENOUS
  Filled 2021-09-06: qty 50

## 2021-09-06 MED ORDER — HEPARIN SOD (PORK) LOCK FLUSH 100 UNIT/ML IV SOLN
INTRAVENOUS | Status: AC
  Filled 2021-09-06: qty 5

## 2021-09-06 MED ORDER — DIPHENHYDRAMINE HCL 50 MG/ML IJ SOLN
50.0000 mg | Freq: Once | INTRAMUSCULAR | Status: AC
  Administered 2021-09-06: 50 mg via INTRAVENOUS
  Filled 2021-09-06: qty 1

## 2021-09-06 MED ORDER — SODIUM CHLORIDE 0.9 % IV SOLN
10.0000 mg | Freq: Once | INTRAVENOUS | Status: AC
  Administered 2021-09-06: 10 mg via INTRAVENOUS
  Filled 2021-09-06: qty 10

## 2021-09-06 MED ORDER — HEPARIN SOD (PORK) LOCK FLUSH 100 UNIT/ML IV SOLN
500.0000 [IU] | Freq: Once | INTRAVENOUS | Status: AC | PRN
  Administered 2021-09-06: 500 [IU]
  Filled 2021-09-06: qty 5

## 2021-09-06 MED ORDER — SODIUM CHLORIDE 0.9 % IV SOLN
150.0000 mg | Freq: Once | INTRAVENOUS | Status: AC
  Administered 2021-09-06: 150 mg via INTRAVENOUS
  Filled 2021-09-06: qty 150

## 2021-09-06 MED ORDER — SODIUM CHLORIDE 0.9 % IV SOLN
652.0000 mg | Freq: Once | INTRAVENOUS | Status: AC
  Administered 2021-09-06: 650 mg via INTRAVENOUS
  Filled 2021-09-06: qty 65

## 2021-09-06 MED ORDER — PALONOSETRON HCL INJECTION 0.25 MG/5ML
0.2500 mg | Freq: Once | INTRAVENOUS | Status: AC
  Administered 2021-09-06: 0.25 mg via INTRAVENOUS
  Filled 2021-09-06: qty 5

## 2021-09-06 NOTE — Assessment & Plan Note (Addendum)
Recommend chemotherapy with carboplatin and Taxol for 3 cycles, repeat imaging and possibly follow-up with additional cycles of chemotherapy Labs are reviewed and discussed with patient. Proceed with cycle 2 carboplatin and Taxol with D4 Udenyca.

## 2021-09-07 ENCOUNTER — Encounter: Payer: Self-pay | Admitting: Oncology

## 2021-09-09 ENCOUNTER — Inpatient Hospital Stay: Payer: Medicare PPO

## 2021-09-09 ENCOUNTER — Other Ambulatory Visit: Payer: Self-pay | Admitting: Oncology

## 2021-09-09 DIAGNOSIS — C541 Malignant neoplasm of endometrium: Secondary | ICD-10-CM

## 2021-09-09 DIAGNOSIS — Z5111 Encounter for antineoplastic chemotherapy: Secondary | ICD-10-CM | POA: Diagnosis not present

## 2021-09-09 MED ORDER — PEGFILGRASTIM-CBQV 6 MG/0.6ML ~~LOC~~ SOSY
6.0000 mg | PREFILLED_SYRINGE | Freq: Once | SUBCUTANEOUS | Status: AC
  Administered 2021-09-09: 6 mg via SUBCUTANEOUS
  Filled 2021-09-09: qty 0.6

## 2021-09-09 MED ORDER — TRAMADOL HCL 50 MG PO TABS: 100.0000 mg | ORAL_TABLET | Freq: Four times a day (QID) | ORAL | 0 refills | Status: DC | PRN

## 2021-09-25 ENCOUNTER — Encounter: Payer: Self-pay | Admitting: Oncology

## 2021-09-26 ENCOUNTER — Inpatient Hospital Stay: Payer: Medicare PPO | Attending: Obstetrics and Gynecology | Admitting: Medical Oncology

## 2021-09-26 DIAGNOSIS — Z801 Family history of malignant neoplasm of trachea, bronchus and lung: Secondary | ICD-10-CM

## 2021-09-26 DIAGNOSIS — Z923 Personal history of irradiation: Secondary | ICD-10-CM | POA: Insufficient documentation

## 2021-09-26 DIAGNOSIS — I10 Essential (primary) hypertension: Secondary | ICD-10-CM | POA: Insufficient documentation

## 2021-09-26 DIAGNOSIS — E538 Deficiency of other specified B group vitamins: Secondary | ICD-10-CM | POA: Insufficient documentation

## 2021-09-26 DIAGNOSIS — E119 Type 2 diabetes mellitus without complications: Secondary | ICD-10-CM | POA: Insufficient documentation

## 2021-09-26 DIAGNOSIS — Z5189 Encounter for other specified aftercare: Secondary | ICD-10-CM | POA: Insufficient documentation

## 2021-09-26 DIAGNOSIS — Z7984 Long term (current) use of oral hypoglycemic drugs: Secondary | ICD-10-CM | POA: Insufficient documentation

## 2021-09-26 DIAGNOSIS — Z803 Family history of malignant neoplasm of breast: Secondary | ICD-10-CM

## 2021-09-26 DIAGNOSIS — L989 Disorder of the skin and subcutaneous tissue, unspecified: Secondary | ICD-10-CM | POA: Diagnosis not present

## 2021-09-26 DIAGNOSIS — Z794 Long term (current) use of insulin: Secondary | ICD-10-CM | POA: Insufficient documentation

## 2021-09-26 DIAGNOSIS — K5903 Drug induced constipation: Secondary | ICD-10-CM | POA: Insufficient documentation

## 2021-09-26 DIAGNOSIS — Z5111 Encounter for antineoplastic chemotherapy: Secondary | ICD-10-CM | POA: Insufficient documentation

## 2021-09-26 DIAGNOSIS — C541 Malignant neoplasm of endometrium: Secondary | ICD-10-CM | POA: Diagnosis not present

## 2021-09-26 DIAGNOSIS — Z79899 Other long term (current) drug therapy: Secondary | ICD-10-CM | POA: Insufficient documentation

## 2021-09-26 DIAGNOSIS — Z7952 Long term (current) use of systemic steroids: Secondary | ICD-10-CM | POA: Insufficient documentation

## 2021-09-26 DIAGNOSIS — E279 Disorder of adrenal gland, unspecified: Secondary | ICD-10-CM | POA: Insufficient documentation

## 2021-09-26 DIAGNOSIS — D649 Anemia, unspecified: Secondary | ICD-10-CM | POA: Insufficient documentation

## 2021-09-26 MED ORDER — DOXYCYCLINE HYCLATE 100 MG PO TABS: 100.0000 mg | ORAL_TABLET | Freq: Two times a day (BID) | ORAL | 0 refills | Status: AC

## 2021-09-26 MED ORDER — HYDROCORTISONE 2.5 % EX CREA: TOPICAL_CREAM | Freq: Two times a day (BID) | CUTANEOUS | 2 refills | Status: DC

## 2021-09-26 NOTE — Telephone Encounter (Signed)
Please advise 

## 2021-09-26 NOTE — Telephone Encounter (Signed)
[facility] appointment scheduled. Daughter aware of appointment time.

## 2021-09-26 NOTE — Progress Notes (Signed)
Virtual Visit Progress Note  [name], Valerie Case are scheduled for a virtual visit with your provider today.    Just as we do with appointments in the office, we must obtain your consent to participate.  Your consent will be active for this visit and any virtual visit you may have with one of our providers in the next 365 days.    If you have a MyChart account, I can also send a copy of this consent to you electronically.  All virtual visits are billed to your insurance company just like a traditional visit in the office.  As this is a virtual visit, video technology does not allow for your provider to perform a traditional examination.  This may limit your provider's ability to fully assess your condition.  If your provider identifies any concerns that need to be evaluated in person or the need to arrange testing such as labs, EKG, etc, we will make arrangements to do so.    Although advances in technology are sophisticated, we cannot ensure that it will always work on either your end or our end.  If the connection with a video visit is poor, we may have to switch to a telephone visit.  With either a video or telephone visit, we are not always able to ensure that we have a secure connection.   I need to obtain your verbal consent now.   Are you willing to proceed with your visit today?   Valerie Case has provided verbal consent on 09/26/2021 for a virtual visit (video or telephone).   Valerie Closs, PA-C 09/26/2021  4:24 PM    I connected with Valerie Case on 09/26/21 at  4:00 PM EDT by video enabled telemedicine visit and verified that I am speaking with the correct person using two identifiers.   I discussed the limitations, risks, security and privacy concerns of performing an evaluation and management service by telemedicine and the availability of in-person appointments. I also discussed with the patient that there may be a patient responsible charge related to this service.  The patient expressed understanding and agreed to proceed.   Other persons participating in the visit and their role in the encounter: Daughter   Patient's location: Home Provider's location: Home  Chief Complaint: Scalp sores  Scalp Sores: Patient is on chemotherapy for endometrial cancer. She is tolerating this well aside from a few scattered sores of her face and scalp. Bilateral in nature. Itch slightly. No discharge, fevers, vision change. She has not used anything on them but has used care to not itch them to prevent bleeding and infection. Due to have treatment tomorrow.     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    Past Surgical History:  Procedure Laterality Date   CESAREAN SECTION  01/23/1979  IR IMAGING GUIDED PORT INSERTION  08/09/2021   TONSILLECTOMY  03/17/1956    Social History   Socioeconomic History   Marital status: Divorced    Spouse name: Not on file   Number of children: Not on file   Years of education: Not on file   Highest education level: Not on file  Occupational History   Not on file  Tobacco Use   Smoking status: Never   Smokeless tobacco: Never  Vaping Use   Vaping Use: Never used  Substance and Sexual Activity   Alcohol use: Not Currently   Drug use: Never   Sexual activity: Not Currently  Other Topics Concern   Not  on file  Social History Narrative      Social Determinants of Health   Financial Resource Strain: Not on file  Food Insecurity: Not on file  Transportation Needs: Not on file  Physical Activity: Not on file  Stress: Not on file  Social Connections: Not on file  Intimate Partner Violence: Not on file    Immunization History  Administered Date(s) Administered   Influenza Inj Mdck Quad Pf 01/12/2018, 12/13/2018, 01/11/2021   Influenza,inj,Quad PF,6+ Mos 12/26/2019   Influenza-Unspecified 12/31/2014, 12/10/2015, 12/09/2016   Moderna Sars-Covid-2 Vaccination 06/04/2019, 07/02/2019, 02/20/2020   Pneumococcal Polysaccharide-23 01/29/2017   Tdap 08/29/2020    Family History  Problem Relation Age of Onset   Breast cancer Mother 85   Cancer Mother    Cancer Father    Lung cancer Father    Breast cancer Cousin        maternal side     Current Outpatient Medications:    doxycycline (VIBRA-TABS) 100 MG tablet, Take 1 tablet (100 mg total) by mouth 2 (two) times daily for 10 days., Disp: 20 tablet, Rfl: 0   hydrocortisone 2.5 % cream, Apply topically 2 (two) times daily., Disp: 30 g, Rfl: 2   acetaminophen (TYLENOL) 650 MG CR tablet, Take 1,300 mg by mouth every 8 (eight) hours as needed for pain., Disp: , Rfl:    albuterol (VENTOLIN HFA) 108 (90 Base) MCG/ACT inhaler, Inhale 2 puffs into the lungs every 6 (six) hours as needed for wheezing or shortness of breath., Disp: , Rfl:    Cranberry-Vitamin C-Probiotic (AZO CRANBERRY PO), Take by mouth., Disp: , Rfl:    docusate sodium (COLACE) 100 MG capsule, Take 1 capsule (100 mg total) by mouth 2 (two) times daily., Disp: 60 capsule, Rfl: 1   empagliflozin (JARDIANCE) 25 MG TABS tablet, Take 25 mg by mouth daily., Disp: , Rfl:    ferrous sulfate 325 (65 FE) MG EC tablet, Take 325 mg by mouth daily with breakfast., Disp: , Rfl:    FLUoxetine (PROZAC) 10 MG capsule, Take 10 mg by mouth daily., Disp: , Rfl:    fluticasone (FLONASE) 50 MCG/ACT  nasal spray, Place 1 spray into both nostrils daily as needed for allergies or rhinitis., Disp: , Rfl:    hydrochlorothiazide (HYDRODIURIL) 12.5 MG tablet, Take 12.5 mg by mouth daily., Disp: , Rfl:    ibuprofen (ADVIL) 200 MG tablet, Take 400 mg by mouth every 8 (eight) hours as needed for moderate pain., Disp: , Rfl:    lidocaine-prilocaine (EMLA) cream, Apply to affected area once, Disp: 30 g, Rfl: 3   lisinopril (ZESTRIL) 40 MG tablet, Take 40 mg by mouth at bedtime., Disp: , Rfl:    LORazepam (ATIVAN) 0.5 MG tablet, Take 1 tablet (0.5 mg total) by mouth every 8 (eight) hours as needed for  anxiety (nausea vomiting)., Disp: 30 tablet, Rfl: 0   metFORMIN (GLUCOPHAGE-XR) 500 MG 24 hr tablet, Take 1,000 mg by mouth at bedtime., Disp: , Rfl:    metoprolol succinate (TOPROL-XL) 25 MG 24 hr tablet, Take 25 mg by mouth daily., Disp: , Rfl:    NOVOLIN 70/30 FLEXPEN (70-30) 100 UNIT/ML KwikPen, 50-110 Units See admin instructions. 50 units in the morning, 110 units at bedtime, Disp: , Rfl:    ondansetron (ZOFRAN) 8 MG tablet, Take 1 tablet (8 mg total) by mouth 2 (two) times daily as needed. Start on the third day after chemotherapy., Disp: 30 tablet, Rfl: 1   OZEMPIC, 1 MG/DOSE, 4 MG/3ML SOPN, Inject 1 mg into the skin every Tuesday., Disp: , Rfl:    prochlorperazine (COMPAZINE) 10 MG tablet, Take 1 tablet (10 mg total) by mouth every 6 (six) hours as needed (Nausea or vomiting)., Disp: 30 tablet, Rfl: 1   rosuvastatin (CRESTOR) 10 MG tablet, Take 10 mg by mouth daily., Disp: , Rfl:    traMADol (ULTRAM) 50 MG tablet, Take 2 tablets (100 mg total) by mouth every 6 (six) hours as needed., Disp: 30 tablet, Rfl: 0  Physical exam: Exam limited due to telemedicine Physical Exam Constitutional:      General: She is not in acute distress.    Appearance: Normal appearance. She is not ill-appearing, toxic-appearing or diaphoretic.  Skin:    Comments: About 8 small scattered slightly raised crusted papules of  bilateral scalp and face. Focused on the left but present bilaterally. No discharge, erythema.   Neurological:     Mental Status: She is alert.       Assessment and plan- Patient is a 71 y.o. female    Visit Diagnosis 1. Skin sore   2. On antineoplastic chemotherapy   3. Endometrial adenocarcinoma ([place])    New. Likely secondary to chemotherapy- likely to worsen with her next treatment being tomorrow. Will have her start spot treatment with topical hydrocortisone 2.5% and she will have doxycyline on hand in case she gets about twice as many. Discussed how to use along with common side effects and precautions. Follow up PRN.   Patient expressed understanding and was in agreement with this plan. She also understands that She can call clinic at any time with any questions, concerns, or complaints.   I discussed the assessment and treatment plan with the patient. The patient was provided an opportunity to ask questions and all were answered. The patient agreed with the plan and demonstrated an understanding of the instructions.   The patient was advised to call back or seek an in-person evaluation if the symptoms worsen or if the condition fails to improve as anticipated.   I spent 15 minutes face-to-face video visit time dedicated to the care of this patient on the date of this encounter to include pre-visit review of past oncology records, face-to-face time with the patient, and post visit ordering of testing/documentation.   Thank you for allowing me to participate in the care of this very pleasant patient.   [facility]  CC:

## 2021-09-27 ENCOUNTER — Inpatient Hospital Stay: Payer: Medicare PPO | Admitting: Licensed Clinical Social Worker

## 2021-09-27 ENCOUNTER — Encounter: Payer: Self-pay | Admitting: Internal Medicine

## 2021-09-27 ENCOUNTER — Inpatient Hospital Stay: Payer: Medicare PPO

## 2021-09-27 ENCOUNTER — Inpatient Hospital Stay: Payer: Medicare PPO | Admitting: Internal Medicine

## 2021-09-27 ENCOUNTER — Encounter: Payer: Self-pay | Admitting: Licensed Clinical Social Worker

## 2021-09-27 DIAGNOSIS — C541 Malignant neoplasm of endometrium: Secondary | ICD-10-CM

## 2021-09-27 DIAGNOSIS — Z794 Long term (current) use of insulin: Secondary | ICD-10-CM | POA: Diagnosis not present

## 2021-09-27 DIAGNOSIS — E119 Type 2 diabetes mellitus without complications: Secondary | ICD-10-CM | POA: Diagnosis not present

## 2021-09-27 DIAGNOSIS — K5903 Drug induced constipation: Secondary | ICD-10-CM | POA: Diagnosis not present

## 2021-09-27 DIAGNOSIS — D649 Anemia, unspecified: Secondary | ICD-10-CM | POA: Diagnosis not present

## 2021-09-27 DIAGNOSIS — E538 Deficiency of other specified B group vitamins: Secondary | ICD-10-CM | POA: Diagnosis not present

## 2021-09-27 DIAGNOSIS — Z7952 Long term (current) use of systemic steroids: Secondary | ICD-10-CM | POA: Diagnosis not present

## 2021-09-27 DIAGNOSIS — I10 Essential (primary) hypertension: Secondary | ICD-10-CM | POA: Diagnosis not present

## 2021-09-27 DIAGNOSIS — Z79899 Other long term (current) drug therapy: Secondary | ICD-10-CM | POA: Diagnosis not present

## 2021-09-27 DIAGNOSIS — Z7984 Long term (current) use of oral hypoglycemic drugs: Secondary | ICD-10-CM | POA: Diagnosis not present

## 2021-09-27 DIAGNOSIS — E279 Disorder of adrenal gland, unspecified: Secondary | ICD-10-CM | POA: Diagnosis not present

## 2021-09-27 DIAGNOSIS — Z5189 Encounter for other specified aftercare: Secondary | ICD-10-CM | POA: Diagnosis not present

## 2021-09-27 DIAGNOSIS — Z923 Personal history of irradiation: Secondary | ICD-10-CM | POA: Diagnosis not present

## 2021-09-27 DIAGNOSIS — Z5111 Encounter for antineoplastic chemotherapy: Secondary | ICD-10-CM | POA: Diagnosis not present

## 2021-09-27 LAB — COMPREHENSIVE METABOLIC PANEL
ALT: 13 U/L (ref 0–44)
AST: 20 U/L (ref 15–41)
Albumin: 3.7 g/dL (ref 3.5–5.0)
Alkaline Phosphatase: 63 U/L (ref 38–126)
Anion gap: 11 (ref 5–15)
BUN: 25 mg/dL — ABNORMAL HIGH (ref 8–23)
CO2: 24 mmol/L (ref 22–32)
Calcium: 9.8 mg/dL (ref 8.9–10.3)
Chloride: 104 mmol/L (ref 98–111)
Creatinine, Ser: 0.81 mg/dL (ref 0.44–1.00)
GFR, Estimated: >60 mL/min (ref >60)
Glucose, Bld: 212 mg/dL — ABNORMAL HIGH (ref 70–99)
Potassium: 4.2 mmol/L (ref 3.5–5.1)
Sodium: 139 mmol/L (ref 135–145)
Total Bilirubin: 0.3 mg/dL (ref 0.3–1.2)
Total Protein: 7.2 g/dL (ref 6.5–8.1)

## 2021-09-27 LAB — CBC WITH DIFFERENTIAL/PLATELET
Abs Immature Granulocytes: 0.04 10*3/uL (ref 0.00–0.07)
Basophils Absolute: 0 10*3/uL (ref 0.0–0.1)
Basophils Relative: 1 %
Eosinophils Absolute: 0.1 10*3/uL (ref 0.0–0.5)
Eosinophils Relative: 2 %
HCT: 31.8 % — ABNORMAL LOW (ref 36.0–46.0)
Hemoglobin: 10.3 g/dL — ABNORMAL LOW (ref 12.0–15.0)
Immature Granulocytes: 1 %
Lymphocytes Relative: 12 %
Lymphs Abs: 0.6 10*3/uL — ABNORMAL LOW (ref 0.7–4.0)
MCH: 30.2 pg (ref 26.0–34.0)
MCHC: 32.4 g/dL (ref 30.0–36.0)
MCV: 93.3 fL (ref 80.0–100.0)
Monocytes Absolute: 0.5 10*3/uL (ref 0.1–1.0)
Monocytes Relative: 10 %
Neutro Abs: 3.8 10*3/uL (ref 1.7–7.7)
Neutrophils Relative %: 74 %
Platelets: 189 10*3/uL (ref 150–400)
RBC: 3.41 MIL/uL — ABNORMAL LOW (ref 3.87–5.11)
RDW: 16.8 % — ABNORMAL HIGH (ref 11.5–15.5)
WBC: 5.1 10*3/uL (ref 4.0–10.5)
nRBC: 0 % (ref 0.0–0.2)

## 2021-09-27 MED ORDER — DIPHENHYDRAMINE HCL 50 MG/ML IJ SOLN
50.0000 mg | Freq: Once | INTRAMUSCULAR | Status: AC
  Administered 2021-09-27: 50 mg via INTRAVENOUS
  Filled 2021-09-27: qty 1

## 2021-09-27 MED ORDER — PALONOSETRON HCL INJECTION 0.25 MG/5ML
0.2500 mg | Freq: Once | INTRAVENOUS | Status: AC
  Administered 2021-09-27: 0.25 mg via INTRAVENOUS
  Filled 2021-09-27: qty 5

## 2021-09-27 MED ORDER — SODIUM CHLORIDE 0.9 % IV SOLN
652.0000 mg | Freq: Once | INTRAVENOUS | Status: AC
  Administered 2021-09-27: 650 mg via INTRAVENOUS
  Filled 2021-09-27: qty 65

## 2021-09-27 MED ORDER — SODIUM CHLORIDE 0.9 % IV SOLN
Freq: Once | INTRAVENOUS | Status: AC
  Filled 2021-09-27: qty 250

## 2021-09-27 MED ORDER — SODIUM CHLORIDE 0.9 % IV SOLN
10.0000 mg | Freq: Once | INTRAVENOUS | Status: AC
  Administered 2021-09-27: 10 mg via INTRAVENOUS
  Filled 2021-09-27: qty 1

## 2021-09-27 MED ORDER — FAMOTIDINE IN NACL 20-0.9 MG/50ML-% IV SOLN
20.0000 mg | Freq: Once | INTRAVENOUS | Status: AC
  Administered 2021-09-27: 20 mg via INTRAVENOUS
  Filled 2021-09-27: qty 50

## 2021-09-27 MED ORDER — SODIUM CHLORIDE 0.9 % IV SOLN
150.0000 mg/m2 | Freq: Once | INTRAVENOUS | Status: AC
  Administered 2021-09-27: 372 mg via INTRAVENOUS
  Filled 2021-09-27: qty 62

## 2021-09-27 MED ORDER — HEPARIN SOD (PORK) LOCK FLUSH 100 UNIT/ML IV SOLN
500.0000 [IU] | Freq: Once | INTRAVENOUS | Status: AC | PRN
  Administered 2021-09-27: 500 [IU]
  Filled 2021-09-27: qty 5

## 2021-09-27 MED ORDER — SODIUM CHLORIDE 0.9 % IV SOLN
150.0000 mg | Freq: Once | INTRAVENOUS | Status: AC
  Administered 2021-09-27: 150 mg via INTRAVENOUS
  Filled 2021-09-27: qty 5

## 2021-09-27 NOTE — Assessment & Plan Note (Signed)
Endometrial adenocarcinoma:  STAGE III-  S/p RT [mid Feb -Mid March]. Currently on chemotherapy with carboplatin and Taxol for 3 cycles, repeat imaging and possibly follow-up with additional cycles of chemotherapy.  # proceed with cycle #3- carboplatin and Taxol; D4 Udenyca.  Labs today reviewed;  acceptable for chemo today.  Awaiting PET scan after this cycle and follow-up with Dr.Yu/[place] as planned.  # DM-post breakfast blood sugars 212; discussed that blood sugars will run high postchemotherapy/steroids.  Recommend monitor blood sugars closely.  #Scalp dermatitis-secondary to chemotherapy; agree with doxycycline hydrocortisone as recommended.  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  3 Drug-induced constipation:Colace '100mg'$  1-2 time daily. Miralax PRN-stable.  # DISPOSITION: # Chemo today;  # appts as scheudled by Dr.Yu-Dr.B

## 2021-09-27 NOTE — Progress Notes (Signed)
[facility]  Initial Assessment   Valerie Case is a 71 y.o. year old female contacted by phone. Clinical Social Work was referred by medical provider for assessment of psychosocial needs.   SDOH (Social Determinants of Health) assessments performed: Yes SDOH Interventions    Flowsheet Row Most Recent Value  SDOH Interventions   Food Insecurity Interventions Intervention Not Indicated  Financial Strain Interventions Intervention Not Indicated  Housing Interventions Intervention Not Indicated  Physical Activity Interventions Intervention Not Indicated  Stress Interventions Provide Counseling  Social Connections Interventions Intervention Not Indicated  Transportation Interventions [facility] ([place]. Only), Patient Resources (Friends/Family)  Depression Interventions/Treatment  Counseling       SDOH Screenings   Alcohol Screen: Low Risk  (09/27/2021)   Alcohol Screen    Last Alcohol Screening Score (AUDIT): 0  Depression (PHQ2-9): Medium Risk (09/27/2021)   Depression (PHQ2-9)    PHQ-2 Score: 9  Financial Resource Strain: Low Risk  (09/27/2021)   Overall Financial Resource Strain (CARDIA)    Difficulty of Paying Living Expenses: Not very hard  Food Insecurity: No Food Insecurity (09/27/2021)   Hunger Vital Sign    Worried About Running Out of Food in the Last Year: Never true    Ran Out of Food in the Last Year: Never true  Housing: Low Risk  (09/27/2021)   Housing    Last Housing Risk Score: 0  Physical Activity: Inactive (09/27/2021)   Exercise Vital Sign    Days of Exercise per Week: 0 days    Minutes of Exercise per Session: 0 min  Social Connections: Moderately Integrated (09/27/2021)   Social Connection and Isolation Panel [NHANES]    Frequency of Communication with Friends and Family: Three times a week    Frequency of Social Gatherings with Friends and Family: Three times a week    Attends Religious Services: 1 to 4 times per year     Active Member of Clubs or Organizations: No    Attends Archivist Meetings: 1 to 4 times per year    Marital Status: Divorced  Stress: Stress Concern Present (09/27/2021)   [employer] [place]    Feeling of Stress : Rather much  Tobacco Use: Low Risk  (09/27/2021)   Patient History    Smoking Tobacco Use: Never    Smokeless Tobacco Use: Never    Passive Exposure: Not on file  Transportation Needs: No Transportation Needs (09/27/2021)   PRAPARE - Transportation    Lack of Transportation (Medical): No    Lack of Transportation (Non-Medical): No     Distress Screen completed: No    04/10/2021    8:41 AM  ONCBCN DISTRESS SCREENING  Screening Type Initial Screening  Distress experienced in past week (1-10) 4      Family/Social Information:  Housing Arrangement: patient lives alone, primary caregiver is daughter Jacelyn Grip   [phone] Family members/support persons in your life? Family and Medical Staff Transportation concerns: no  Employment: Retired .[employer]  Income source: Paediatric nurse concerns: No Type of concern: None Food access concerns: no Religious or spiritual practice: Yes-. Services Currently in place:  [employer]  Coping/ Adjustment to diagnosis: Patient understands treatment plan and what happens next? yes Concerns about diagnosis and/or treatment: Pain or discomfort during procedures, Overwhelmed by information, How will I care for myself, and Quality of life Patient reported stressors: Childcare/ elder care, Children, Anxiety/ nervousness, Adjusting to my illness, and concerns  about daughter being caregiver, daughter has medical issues that may impede caregiving Hopes and/or priorities: N/A Patient enjoys  N/A Current coping skills/ strengths: Ability for insight , Capable of independent living , Communication skills , Scientist, research (life sciences) , General fund of knowledge  , Motivation for treatment/growth , Religious Affiliation , and Supportive family/friends     SUMMARY: Current SDOH Barriers:  Limited social support and Mental Health Concerns   Clinical Social Work Clinical Goal(s):  Patient will work with SW to address concerns related to adjustment and anxiety due to diagnosis and recent life events.  Interventions: Discussed common feeling and emotions when being diagnosed with cancer, and the importance of support during treatment Informed patient of the support team roles and support services at [facility] Provided CSW contact information and encouraged patient to call with any questions or concerns Provided patient with information about CSW role in patient care and other available resources.   Follow Up Plan: CSW will see patient on July 17th, 2023 at 11:15. Patient verbalizes understanding of plan: Yes    [employer], LCSW

## 2021-09-27 NOTE — Progress Notes (Signed)
[facility] CONSULT NOTE  Patient Care Team: Tracie Harrier, MD as PCP - General (Internal Medicine) Cammie Sickle, MD as Consulting Physician (Oncology)  CHIEF COMPLAINTS/PURPOSE OF CONSULTATION: Uterine cancer  Oncology History  Endometrial adenocarcinoma ([place])  12/12/2020 Imaging   12/12/2020, CT abdomen pelvis with contrast showed no radiographic evidence of urinary tract neoplasm, calculi, hydronephrosis.  2.6 cm nonspecific left adrenal mass. 02/12/2021 CT hematuria work-up showed small uterine fibroids, no findings to explain hematuria.Hepatomegaly, diverticulosis without evidence of diverticulitis, Coronary artery disease   04/01/2021 -  Hospital Admission   04/01/2021 - 04/03/2021, patient was hospitalized due to symptomatic anemia, hemoglobin 6.9, heavy postmenopausal bleeding with passing large clots.  She also presented with increased creatinine level to 2.58 compared to her baseline level of 0.9 in November 2022.  Patient received IV iron infusion and 2 units of PRBC during the hospital stay..  04/02/2021, iron panel showed iron saturation 37, ferritin 37, TIBC 333-the studies were done after patient received blood transfusion.  At discharge, hemoglobin was 7.7.   04/01/2021 Initial Diagnosis   04/01/2020, endometrial biopsy showed poorly differentiated endometrial adenocarcinoma. Omniseq NGS showed TMB 48.4 mt/mb [high], MSI High, PD-L1-TPS <1%, PIK3CA N3614E, Negative for BRAF, HER2, NTRK1 RET.     04/10/2021 Cancer Staging   Staging form: Corpus Uteri - Carcinoma and Carcinosarcoma, AJCC 8th Edition - Clinical stage from 04/10/2021: FIGO Stage III, calculated as Stage Unknown (cT3, cNX, cM0) - Signed by Earlie Server, MD on 08/24/2021 Stage prefix: Initial diagnosis   04/15/2021 Imaging   PET showed 1. Large hypermetabolic endometrial mass consistent with known endometrial cancer. Suspect direct invasion/involvement of the right adnexa. Right pelvic sidewall  hypermetabolic adenopathy.2. 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 3. No findings for metastatic disease involving the chest or bonystructures.     04/24/2021 - 05/29/2021 Radiation Therapy   status post radiation to pelvis   08/05/2021 Imaging   MRI abdomen w wo contrast 1. Stable solid enhancing 2.7 cm left adrenal lesion, which was hypermetabolic on prior PET/CTs but is unchanged in size dating back to December 12, 2020. 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 However, unfortunately metastatic disease can not be entirely excluded and remains a pertinent differential consideration. Comparison with more remote prior imaging would be the most valuable tool in the assessment of this lesion, as demonstrating long-term stability would indicate this to be a benign lesion. However, if no prior imaging can be made available, would consider follow-up adrenal protocol CT with and without intravenous contrast material in 3 months as this would allow for further assessment of stability as well as enhancement and washout characteristics of the lesion potentially allowing for better characterization versus direct tissue sampling.2. Hepatomegaly and hepatic steatosis.3. Colonic diverticulosis without findings of acute diverticulitis    08/16/2021 -  Chemotherapy   Patient is on Treatment Plan : UTERINE Carboplatin AUC 6 / Paclitaxel q21d       HISTORY OF PRESENTING ILLNESS: Patient alone.  Ambulating in wheelchair-  Valerie Case 71 y.o.  female with stage III endometrial cancer currently on neoadjuvant chemotherapy-is here for follow-up.  In the interim evaluated by symptom management for rash on her scalp.  Recommended hydrocortisone  cream doxycycline not started yet  Patient admits to mild body aches  joint pains postchemotherapy.  Patient states pains are not any worse after the growth factor injection.  No nausea no vomiting.  Mild tingling and numbness in extremities.  No fevers or chills.  Review of Systems  Constitutional:  Negative for chills, diaphoresis, fever, malaise/fatigue and weight loss.  HENT:  Negative for nosebleeds and sore throat.   Eyes:  Negative for double vision.  Respiratory:  Negative for cough, hemoptysis, sputum production, shortness of breath and wheezing.   Cardiovascular:  Negative for chest pain, palpitations, orthopnea and leg swelling.  Gastrointestinal:  Negative for abdominal pain, blood in stool, constipation, diarrhea, heartburn, melena, nausea and vomiting.  Genitourinary:  Negative for dysuria, frequency and urgency.  Musculoskeletal:  Negative for back pain and joint pain.  Skin: Negative.  Negative for itching and rash.  Neurological:  Negative for dizziness, tingling, focal weakness, weakness and headaches.  Endo/Heme/Allergies:  Does not bruise/bleed easily.  Psychiatric/Behavioral:  Negative for depression. The patient is not nervous/anxious and does not have insomnia.      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    SURGICAL HISTORY: Past Surgical History:  Procedure Laterality Date   CESAREAN SECTION  01/23/1979   IR IMAGING GUIDED PORT INSERTION  08/09/2021   TONSILLECTOMY  03/17/1956    SOCIAL HISTORY: Social History   Socioeconomic History   Marital status: Divorced    Spouse name: Not on file   Number of children: Not on file   Years of education: Not on file   Highest education  level: Not on file  Occupational History   Not on file  Tobacco Use   Smoking status: Never   Smokeless tobacco: Never  Vaping Use   Vaping Use: Never used  Substance and Sexual Activity   Alcohol use: Not Currently   Drug use: Never   Sexual activity: Not Currently  Other Topics Concern   Not on file  Social History Narrative      Social Determinants of Health   Financial Resource Strain: Not on file  Food Insecurity: Not on file  Transportation Needs: Not on file  Physical Activity: Not on file  Stress: Not on file  Social Connections: Not on file  Intimate Partner Violence: Not on file    FAMILY HISTORY: Family History  Problem Relation Age of Onset   Breast cancer Mother 48   Cancer Mother    Cancer Father    Lung cancer Father    Breast cancer Cousin        maternal side    ALLERGIES:  is allergic to aspirin.  MEDICATIONS:  Current Outpatient Medications  Medication Sig Dispense Refill   acetaminophen (TYLENOL) 650 MG CR tablet Take 1,300 mg by mouth every 8 (eight) hours as needed for pain.     albuterol (VENTOLIN HFA) 108 (90 Base) MCG/ACT inhaler Inhale 2 puffs into the lungs every 6 (six) hours as needed for wheezing or shortness of breath.     Cranberry-Vitamin C-Probiotic (AZO CRANBERRY PO) Take by mouth.     docusate sodium (COLACE) 100 MG capsule Take 1 capsule (100 mg total) by mouth 2 (two) times daily. 60 capsule 1   empagliflozin (JARDIANCE) 25 MG TABS  tablet Take 25 mg by mouth daily.     ferrous sulfate 325 (65 FE) MG EC tablet Take 325 mg by mouth daily with breakfast.     FLUoxetine (PROZAC) 10 MG capsule Take 10 mg by mouth daily.     fluticasone (FLONASE) 50 MCG/ACT nasal spray Place 1 spray into both nostrils daily as needed for allergies or rhinitis.     hydrochlorothiazide (HYDRODIURIL) 12.5 MG tablet Take 12.5 mg by mouth daily.     hydrocortisone 2.5 % cream Apply topically 2 (two) times daily. 30 g 2   ibuprofen (ADVIL) 200 MG tablet  Take 400 mg by mouth every 8 (eight) hours as needed for moderate pain.     lidocaine-prilocaine (EMLA) cream Apply to affected area once 30 g 3   lisinopril (ZESTRIL) 40 MG tablet Take 40 mg by mouth at bedtime.     LORazepam (ATIVAN) 0.5 MG tablet Take 1 tablet (0.5 mg total) by mouth every 8 (eight) hours as needed for anxiety (nausea vomiting). 30 tablet 0   metFORMIN (GLUCOPHAGE-XR) 500 MG 24 hr tablet Take 1,000 mg by mouth at bedtime.     metoprolol succinate (TOPROL-XL) 25 MG 24 hr tablet Take 25 mg by mouth daily.     NOVOLIN 70/30 FLEXPEN (70-30) 100 UNIT/ML KwikPen 50-110 Units See admin instructions. 50 units in the morning, 110 units at bedtime     ondansetron (ZOFRAN) 8 MG tablet Take 1 tablet (8 mg total) by mouth 2 (two) times daily as needed. Start on the third day after chemotherapy. 30 tablet 1   OZEMPIC, 1 MG/DOSE, 4 MG/3ML SOPN Inject 1 mg into the skin every Tuesday.     prochlorperazine (COMPAZINE) 10 MG tablet Take 1 tablet (10 mg total) by mouth every 6 (six) hours as needed (Nausea or vomiting). 30 tablet 1   rosuvastatin (CRESTOR) 10 MG tablet Take 10 mg by mouth daily.     traMADol (ULTRAM) 50 MG tablet Take 2 tablets (100 mg total) by mouth every 6 (six) hours as needed. 30 tablet 0   doxycycline (VIBRA-TABS) 100 MG tablet Take 1 tablet (100 mg total) by mouth 2 (two) times daily for 10 days. (Patient not taking: Reported on 09/27/2021) 20 tablet 0   No current facility-administered medications for this visit.   Facility-Administered Medications Ordered in Other Visits  Medication Dose Route Frequency Provider Last Rate Last Admin   0.9 %  sodium chloride infusion   Intravenous Once Earlie Server, MD       CARBOplatin (PARAPLATIN) 650 mg in sodium chloride 0.9 % 250 mL chemo infusion  650 mg Intravenous Once Earlie Server, MD       dexamethasone (DECADRON) 10 mg in sodium chloride 0.9 % 50 mL IVPB  10 mg Intravenous Once Earlie Server, MD       diphenhydrAMINE (BENADRYL) injection  50 mg  50 mg Intravenous Once Earlie Server, MD       famotidine (PEPCID) IVPB 20 mg premix  20 mg Intravenous Once Earlie Server, MD       fosaprepitant (EMEND) 150 mg in sodium chloride 0.9 % 145 mL IVPB  150 mg Intravenous Once Earlie Server, MD       heparin lock flush 100 unit/mL  500 Units Intracatheter Once PRN Earlie Server, MD       PACLitaxel (TAXOL) 372 mg in sodium chloride 0.9 % 500 mL chemo infusion (> 22m/m2)  150 mg/m2 (Treatment Plan Recorded) Intravenous Once YEarlie Server MD  palonosetron (ALOXI) injection 0.25 mg  0.25 mg Intravenous Once Earlie Server, MD        PHYSICAL EXAMINATION:  Vitals:   09/27/21 0854  BP: (!) 157/88  Pulse: 100  Resp: 18  Temp: 98.7 F (37.1 C)  SpO2: 97%   Filed Weights   09/27/21 0854  Weight: 279 lb (126.6 kg)   Maculopapular rash noted on the scalp- Physical Exam Vitals and nursing note reviewed.  HENT:     Head: Normocephalic and atraumatic.     Mouth/Throat:     Pharynx: Oropharynx is clear.  Eyes:     Extraocular Movements: Extraocular movements intact.     Pupils: Pupils are equal, round, and reactive to light.  Cardiovascular:     Rate and Rhythm: Normal rate and regular rhythm.  Pulmonary:     Comments: Decreased breath sounds bilaterally.  Abdominal:     Palpations: Abdomen is soft.  Musculoskeletal:        General: Normal range of motion.     Cervical back: Normal range of motion.  Skin:    General: Skin is warm.  Neurological:     General: No focal deficit present.     Mental Status: She is alert and oriented to person, place, and time.  Psychiatric:        Behavior: Behavior normal.        Judgment: Judgment normal.      LABORATORY DATA:  I have reviewed the data as listed Lab Results  Component Value Date   WBC 5.1 09/27/2021   HGB 10.3 (L) 09/27/2021   HCT 31.8 (L) 09/27/2021   MCV 93.3 09/27/2021   PLT 189 09/27/2021   Recent Labs    04/11/21 1553 05/17/21 1024 08/23/21 1000 09/06/21 0808 09/27/21 0834  NA   --    < > 139 139 139  K  --    < > 3.4* 3.8 4.2  CL  --    < > 105 105 104  CO2  --    < > 24 24 24   GLUCOSE  --    < > 177* 232* 212*  BUN  --    < > 29* 21 25*  CREATININE  --    < > 0.91 0.94 0.81  CALCIUM  --    < > 8.9 9.0 9.8  GFRNONAA  --    < > >60 >60 >60  PROT  --    < > 7.1 7.1 7.2  ALBUMIN  --    < > 3.7 3.8 3.7  AST  --    < > 21 24 20   ALT  --    < > 14 19 13   ALKPHOS  --    < > 64 61 63  BILITOT 0.3   < > 0.4 0.4 0.3  BILIDIR <0.1  --   --   --   --   IBILI NOT CALCULATED  --   --   --   --    < > = values in this interval not displayed.    RADIOGRAPHIC STUDIES: I have personally reviewed the radiological images as listed and agreed with the findings in the report. MM DIAG BREAST TOMO BILATERAL  Result Date: 09/04/2021 CLINICAL DATA:  Nonfocal LEFT breast pain. Currently under treatment for uterine cancer. EXAM: DIGITAL DIAGNOSTIC BILATERAL MAMMOGRAM WITH TOMOSYNTHESIS AND CAD TECHNIQUE: Bilateral digital diagnostic mammography and breast tomosynthesis was performed. The images were evaluated with computer-aided detection. COMPARISON:  Previous exam(s).  ACR Breast Density Category a: The breast tissue is almost entirely fatty. FINDINGS: Diagnostic mammographic images were obtained over the area of pain in the LEFT breast. No suspicious mammographic finding is identified in this area. No suspicious mass, microcalcification, or other finding is identified in either breast. No suspicious mammographic etiology for breast pain identified. IMPRESSION: No mammographic evidence of malignancy bilaterally. No suspicious mammographic etiology for nonfocal LEFT breast pain is identified. Any further workup of the patient's symptoms should be based on the clinical assessment. Recommend routine annual screening mammogram in 1 year. RECOMMENDATION: Screening mammogram in one year.(Code:SM-B-01Y) I have discussed the findings and recommendations with the patient. If applicable, a reminder  letter will be sent to the patient regarding the next appointment. BI-RADS CATEGORY  1: Negative. Electronically Signed   By: Valentino Saxon M.D.   On: 09/04/2021 11:13    Endometrial adenocarcinoma [facility]) Endometrial adenocarcinoma:  STAGE III-  S/p RT [mid Feb -Mid March]. Currently on chemotherapy with carboplatin and Taxol for 3 cycles, repeat imaging and possibly follow-up with additional cycles of chemotherapy.  # proceed with cycle #3- carboplatin and Taxol; D4 Udenyca.  Labs today reviewed;  acceptable for chemo today.  Awaiting PET scan after this cycle and follow-up with Dr.Yu/[place] as planned.  # DM-post breakfast blood sugars 212; discussed that blood sugars will run high postchemotherapy/steroids.  Recommend monitor blood sugars closely.  #Scalp dermatitis-secondary to chemotherapy; agree with doxycycline hydrocortisone as recommended.   Adrenal mass: Benign versus malignant-await repeat PET scan   Anemia: Multifactorial-chemotherapy iron deficiency B12 deficiency= stable around 10; status post IV iron infusion/B12 injection.   3 Drug-induced constipation:Colace 160m 1-2 time daily. Miralax PRN-stable.  # DISPOSITION: # Chemo today;   # appts as scheudled by Dr.Yu-Dr.B    All questions were answered.  My contact information was given to the patient.  The patient knows to call the clinic with any problems, questions or concerns.     GCammie Sickle MD 09/27/2021 9:25 AM

## 2021-09-27 NOTE — Progress Notes (Signed)
[place] Work  Clinical Social Work was referred by  nutritionist  for assessment of psychosocial needs.  Clinical Social Worker contacted patient by phone  to offer support and assess for needs. CSW left voicemail with contact information and request for a return call.       Adelene Amas, [place] Worker [facility]

## 2021-09-27 NOTE — Patient Instructions (Signed)
[facility]  Discharge Instructions: Thank you for choosing [facility] to provide your oncology and hematology care.  If you have a lab appointment with the Cancer Center, please go directly to the Cancer Center and check in at the registration area.  Wear comfortable clothing and clothing appropriate for easy access to any Portacath or PICC line.   We strive to give you quality time with your provider. You may need to reschedule your appointment if you arrive late (15 or more minutes).  Arriving late affects you and other patients whose appointments are after yours.  Also, if you miss three or more appointments without notifying the office, you may be dismissed from the clinic at the provider's discretion.      For prescription refill requests, have your pharmacy contact our office and allow 72 hours for refills to be completed.       To help prevent nausea and vomiting after your treatment, we encourage you to take your nausea medication as directed.  BELOW ARE SYMPTOMS THAT SHOULD BE REPORTED IMMEDIATELY: *FEVER GREATER THAN 100.4 F (38 C) OR HIGHER *CHILLS OR SWEATING *NAUSEA AND VOMITING THAT IS NOT CONTROLLED WITH YOUR NAUSEA MEDICATION *UNUSUAL SHORTNESS OF BREATH *UNUSUAL BRUISING OR BLEEDING *URINARY PROBLEMS (pain or burning when urinating, or frequent urination) *BOWEL PROBLEMS (unusual diarrhea, constipation, pain near the anus) TENDERNESS IN MOUTH AND THROAT WITH OR WITHOUT PRESENCE OF ULCERS (sore throat, sores in mouth, or a toothache) UNUSUAL RASH, SWELLING OR PAIN  UNUSUAL VAGINAL DISCHARGE OR ITCHING   Items with * indicate a potential emergency and should be followed up as soon as possible or go to the Emergency Department if any problems should occur.  Please show the CHEMOTHERAPY ALERT CARD or IMMUNOTHERAPY ALERT CARD at check-in to the Emergency Department and triage nurse.  Should you have questions after your  visit or need to cancel or reschedule your appointment, please contact [facility] [place]  [phone] and follow the prompts.  Office hours are 8:00 a.m. to 4:30 p.m. Monday - Friday. Please note that voicemails left after 4:00 p.m. may not be returned until the following business day.  We are closed weekends and major holidays. You have access to a nurse at all times for urgent questions. Please call the main number to the clinic [phone] and follow the prompts.  For any non-urgent questions, you may also contact your provider using MyChart. We now offer e-Visits for anyone 18 and older to request care online for non-urgent symptoms. For details visit [email or web address].   Also download the MyChart app! Go to the app store, search "MyChart", open the app, select [facility], and log in with your MyChart username and password.  Masks are optional in the cancer centers. If you would like for your care team to wear a mask while they are taking care of you, please let them know. For doctor visits, patients may have with them one support person who is at least 70 years old. At this time, visitors are not allowed in the infusion area.   

## 2021-09-27 NOTE — Progress Notes (Signed)
Nutrition Follow-up:   Patient with anemia and endometrial cancer, followed by Dr Tasia Catchings.  Patient received radiation 04/24/21-05/01/21.  Patient receiving carbo and taxol.   Met with patient during infusion.  She reports that she is doing ok. Feeling sleeping from benadryl.  Says that she had one day where she took nausea medication and it helped her symptoms.  Also has been taking miralax to help with constipation which is better.  Reports that she has been trying to add protein at meal times.  Likes salads and tries to add protein source.  Has been eating chicken salad and potato salad with crackers, jello, pudding. Daughter has gotten her some Healthy Choice frozen meals which are easy for her to prepare.    Patient expresses feeling of being overwhelmed with cancer diagnosis and recent sudden death of brother on 10-05-2021.      Medications: reviewed  Labs: reviewed  Anthropometrics:   Weight 279 lb today  279 lb on 6/23 289 lb on 6/1 292 lb on 01/14/21   NUTRITION DIAGNOSIS: Inadequate oral intake stable    INTERVENTION:  Spoke with MD and agreeable for RD to place referral to Social Work.  Discussed with patient and she is agreeable for social work to reach out to her.   Encouraged patient to continue to include good sources of protein in diet at meal time.       MONITORING, EVALUATION, GOAL: weight trends, intake   NEXT VISIT: Friday, August 4 during infusion  Matteus Mcnelly B. Zenia Resides, [place], [place] Registered Dietitian [phone]

## 2021-09-27 NOTE — Progress Notes (Signed)
Patient reports she has small bump on scalp. She had virtual visit with Judson Roch, NP yesterday and issue was addressed. She prescribed doxycycline, but pt has not started yet. Plans to start today.

## 2021-09-30 ENCOUNTER — Inpatient Hospital Stay: Payer: Medicare PPO | Admitting: Licensed Clinical Social Worker

## 2021-09-30 ENCOUNTER — Inpatient Hospital Stay: Payer: Medicare PPO

## 2021-09-30 DIAGNOSIS — C541 Malignant neoplasm of endometrium: Secondary | ICD-10-CM

## 2021-09-30 DIAGNOSIS — Z5111 Encounter for antineoplastic chemotherapy: Secondary | ICD-10-CM | POA: Diagnosis not present

## 2021-09-30 MED ORDER — PEGFILGRASTIM-CBQV 6 MG/0.6ML ~~LOC~~ SOSY
6.0000 mg | PREFILLED_SYRINGE | Freq: Once | SUBCUTANEOUS | Status: AC
  Administered 2021-09-30: 6 mg via SUBCUTANEOUS
  Filled 2021-09-30: qty 0.6

## 2021-09-30 NOTE — Progress Notes (Signed)
[place] CSW Progress Note  Holiday representative  met with patient and primary caregiver [place],Elizabeth A (Daughter)    to discuss adjustment concerns during treatment. Patient reports high levels of stress, symptoms of depression and anxiety due to diagnosis and effects of chemotherapy treatment. Patient discussed feeling isolated and distanced from friends and social situations. P[place]in acknowledged much of her isolation was self-imposed.  CSW and patient discussed different ways to connect more with friends and family and to stay socially active, patient is still able to drive herself when she is feeling well.  CSW and patient discussed different things she could do at home to increase overall-well being. Including stepping outside during  cooler times of the day to enjoy fresh air and sunlight and physical movements or sitting exercises to help her with physical movement throughout the day.  CSW and patient discussed radical acceptance, giving grace and flexibility to yourself and setting boundaries.  CSW and patient discussed different ways to mitigate anxiety and feelings of depression, including CBT and gratitude. Patient scheduled appointment with CSW for 10/07/2021 at 11:15AM.    Adelene Amas, LCSW

## 2021-10-03 ENCOUNTER — Ambulatory Visit
Admission: RE | Admit: 2021-10-03 | Discharge: 2021-10-03 | Disposition: A | Discharge status: Home / Self Care | Payer: Medicare PPO | Source: Ambulatory Visit | Attending: Oncology | Admitting: Oncology

## 2021-10-03 VITALS — Ht 68.0 in | Wt 269.0 lb

## 2021-10-03 DIAGNOSIS — C541 Malignant neoplasm of endometrium: Secondary | ICD-10-CM | POA: Insufficient documentation

## 2021-10-03 LAB — GLUCOSE, CAPILLARY: Glucose-Capillary: 151 mg/dL — ABNORMAL HIGH (ref 70–99)

## 2021-10-03 MED ORDER — FLUDEOXYGLUCOSE F - 18 (FDG) INJECTION
14.5000 | Freq: Once | INTRAVENOUS | Status: AC | PRN
  Administered 2021-10-03: 14.57 via INTRAVENOUS

## 2021-10-07 ENCOUNTER — Other Ambulatory Visit (HOSPITAL_COMMUNITY): Payer: Medicare PPO

## 2021-10-07 ENCOUNTER — Other Ambulatory Visit: Payer: Self-pay

## 2021-10-07 ENCOUNTER — Inpatient Hospital Stay: Payer: Medicare PPO | Admitting: Licensed Clinical Social Worker

## 2021-10-07 DIAGNOSIS — C541 Malignant neoplasm of endometrium: Secondary | ICD-10-CM

## 2021-10-07 NOTE — Progress Notes (Signed)
[place] CSW Counseling Note  Patient was referred by medical provider. Treatment type: Individual  Presenting Concerns: Patient and/or family reports the following symptoms/concerns: anxiety, depression, and stress Duration of problem: 3 months; Severity of problem: moderate   Orientation:oriented to person, place, time/date, situation, day of week, month of year, and year.   Affect: Appropriate Risk of harm to self or others: No plan to harm self or others  Patient and/or Family's Strengths/Protective Factors: Social connections, Concrete supports in place (healthy food, safe environments, etc.), Sense of purpose, Physical Health (exercise, healthy diet, medication compliance, etc.), and Caregiver has knowledge of parenting & child developmentAbility for insight  Active sense of humor  Average or above average intelligence  Capable of independent living  Electronics engineer  Motivation for treatment/growth  Religious Affiliation  Supportive family/friends      Goals Addressed: Patient will:  Reduce symptoms of: anxiety, depression, and stress Increase knowledge and/or ability of: healthy habits, stress reduction, and adjustment to diagnosis and treatment side-effects   Increase healthy adjustment to current life circumstances and Begin healthy grieving over loss   Progress towards Goals: Progressing   Interventions: Interventions utilized:  CBT, Solution Focused, Strength-based, Assertiveness Training, and Supportive      Assessment: Patient currently experiencing adjustment concerns due to diagnosis and treatment side-effects. Patient is still experiencing increased anxiety and symptoms of depression but is feeling better than last week and grateful    Patient may benefit from continued counseling.   Plan: Follow up with CSW: in 2 weeks Behavioral recommendations: N/A Referral(s): Other medical:  N/A       Adelene Amas, LCSW

## 2021-10-16 ENCOUNTER — Other Ambulatory Visit: Payer: Self-pay | Admitting: Oncology

## 2021-10-16 ENCOUNTER — Inpatient Hospital Stay: Payer: Medicare PPO | Attending: Obstetrics and Gynecology | Admitting: Obstetrics and Gynecology

## 2021-10-16 VITALS — BP 148/77 | HR 90 | Resp 18 | Wt 276.0 lb

## 2021-10-16 DIAGNOSIS — E119 Type 2 diabetes mellitus without complications: Secondary | ICD-10-CM | POA: Insufficient documentation

## 2021-10-16 DIAGNOSIS — R531 Weakness: Secondary | ICD-10-CM | POA: Insufficient documentation

## 2021-10-16 DIAGNOSIS — R5383 Other fatigue: Secondary | ICD-10-CM | POA: Insufficient documentation

## 2021-10-16 DIAGNOSIS — N644 Mastodynia: Secondary | ICD-10-CM | POA: Diagnosis not present

## 2021-10-16 DIAGNOSIS — Z794 Long term (current) use of insulin: Secondary | ICD-10-CM | POA: Insufficient documentation

## 2021-10-16 DIAGNOSIS — I700 Atherosclerosis of aorta: Secondary | ICD-10-CM | POA: Insufficient documentation

## 2021-10-16 DIAGNOSIS — Z5112 Encounter for antineoplastic immunotherapy: Secondary | ICD-10-CM | POA: Diagnosis present

## 2021-10-16 DIAGNOSIS — Z7189 Other specified counseling: Secondary | ICD-10-CM

## 2021-10-16 DIAGNOSIS — K5730 Diverticulosis of large intestine without perforation or abscess without bleeding: Secondary | ICD-10-CM | POA: Insufficient documentation

## 2021-10-16 DIAGNOSIS — K760 Fatty (change of) liver, not elsewhere classified: Secondary | ICD-10-CM | POA: Insufficient documentation

## 2021-10-16 DIAGNOSIS — Z923 Personal history of irradiation: Secondary | ICD-10-CM | POA: Insufficient documentation

## 2021-10-16 DIAGNOSIS — E279 Disorder of adrenal gland, unspecified: Secondary | ICD-10-CM | POA: Insufficient documentation

## 2021-10-16 DIAGNOSIS — M4316 Spondylolisthesis, lumbar region: Secondary | ICD-10-CM | POA: Insufficient documentation

## 2021-10-16 DIAGNOSIS — K439 Ventral hernia without obstruction or gangrene: Secondary | ICD-10-CM | POA: Diagnosis not present

## 2021-10-16 DIAGNOSIS — Z79899 Other long term (current) drug therapy: Secondary | ICD-10-CM | POA: Insufficient documentation

## 2021-10-16 DIAGNOSIS — Z803 Family history of malignant neoplasm of breast: Secondary | ICD-10-CM | POA: Insufficient documentation

## 2021-10-16 DIAGNOSIS — I10 Essential (primary) hypertension: Secondary | ICD-10-CM | POA: Diagnosis not present

## 2021-10-16 DIAGNOSIS — Z5111 Encounter for antineoplastic chemotherapy: Secondary | ICD-10-CM | POA: Insufficient documentation

## 2021-10-16 DIAGNOSIS — Z6841 Body Mass Index (BMI) 40.0 and over, adult: Secondary | ICD-10-CM | POA: Diagnosis not present

## 2021-10-16 DIAGNOSIS — Z5189 Encounter for other specified aftercare: Secondary | ICD-10-CM | POA: Diagnosis not present

## 2021-10-16 DIAGNOSIS — R160 Hepatomegaly, not elsewhere classified: Secondary | ICD-10-CM | POA: Insufficient documentation

## 2021-10-16 DIAGNOSIS — R599 Enlarged lymph nodes, unspecified: Secondary | ICD-10-CM | POA: Insufficient documentation

## 2021-10-16 DIAGNOSIS — I872 Venous insufficiency (chronic) (peripheral): Secondary | ICD-10-CM | POA: Diagnosis not present

## 2021-10-16 DIAGNOSIS — R58 Hemorrhage, not elsewhere classified: Secondary | ICD-10-CM | POA: Diagnosis not present

## 2021-10-16 DIAGNOSIS — D500 Iron deficiency anemia secondary to blood loss (chronic): Secondary | ICD-10-CM | POA: Insufficient documentation

## 2021-10-16 DIAGNOSIS — Z886 Allergy status to analgesic agent status: Secondary | ICD-10-CM | POA: Insufficient documentation

## 2021-10-16 DIAGNOSIS — I517 Cardiomegaly: Secondary | ICD-10-CM | POA: Insufficient documentation

## 2021-10-16 DIAGNOSIS — Z801 Family history of malignant neoplasm of trachea, bronchus and lung: Secondary | ICD-10-CM | POA: Insufficient documentation

## 2021-10-16 DIAGNOSIS — M7989 Other specified soft tissue disorders: Secondary | ICD-10-CM | POA: Insufficient documentation

## 2021-10-16 DIAGNOSIS — C541 Malignant neoplasm of endometrium: Secondary | ICD-10-CM | POA: Diagnosis present

## 2021-10-16 DIAGNOSIS — Z809 Family history of malignant neoplasm, unspecified: Secondary | ICD-10-CM | POA: Insufficient documentation

## 2021-10-16 DIAGNOSIS — Z86018 Personal history of other benign neoplasm: Secondary | ICD-10-CM | POA: Insufficient documentation

## 2021-10-16 NOTE — Progress Notes (Signed)
PATIENT REPORTS SOME DEPRESSION AND SOB/FEELING WEAK AND TIRED

## 2021-10-16 NOTE — Progress Notes (Signed)
Gynecologic Oncology Interval Visit   Referring Provider: Dr. Glennon Mac  Chief Complaint: FIGO grade III endometrial adenocarcinoma  Subjective:  Valerie Case is a 71 y.o. female who is seen in consultation from Dr. Glennon Mac for poorly differentiated endometrial adenocarcinoma, figo grade III, s/p radiation x 5 weeks to control bleeding, felt to be poor surgical candidate, received chemotherapy who presents to clinic for follow up.   In the interim, she has completed 3 cycles of carbo-taxol chemotherapy. She's had an interval pet to assess treatment response. She denies vaginal bleeding. Leg swelling has improved. Reports fatigue and generalized weakness with chemotherapy. She has not received immunotherapy  PET 10/03/21-  - no residual hypermetabolism of the uterus suggestive of excellent response to treatment. No findings of metastatic disease. Resolution of hypermetabolism of left adrenal lesions. Diffuse marrow hypermetabolism likely secondary to udenyca/GCSF vs chemotherapy.   08/20/21- Omniseq NGS showed TMB 48.4 mt/mb [high], MSI High, PD-L1-TPS <1%, PIK3CA H1047Q, Negative for BRAF, HER2, NTRK1 RET. MMR was not reported.   .      Gynecologic Oncology History Valerie Case is a pleasant female who is seen in consultation from Dr. Glennon Mac for poorly differentiated endometrial adenocarcinoma, figo grade III. Please see prior notes for complete detail  04/01/21 She was admitted  to [facility] with vaginal bleeding and received 2 units pRBCs and iron infusion. She underwent endometrial biopsy on which revealed poorly differentiated endometrial adenocarcinoma FIGO III  04/02/2021 BILATERAL LOWER EXTREMITY VENOUS DOPPLER ULTRASOUND No evidence of deep venous thrombosis in either lower extremity.  04/16/2021 PET IMPRESSION: 1. Large hypermetabolic endometrial mass consistent with known endometrial cancer. Suspect direct invasion/involvement of the right adnexa. Right pelvic sidewall  hypermetabolic adenopathy. 2. 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 3. No findings for metastatic disease involving the chest or bony structures.  04/24/2021 Tumor Board Documentation Patient to undergo radiation therapy. Plan to reassess after radiation with imaging to re-evaluate possible surgical intervention.     04/24/2021 - 05/29/2021: She completed WPRT Dose/Fx (Gy): 1.8 #Fx: 25 / 25. Total Dose (Gy): 45  06/12/2021 PET IMPRESSION: 1. Substantial reduction in size and activity of the uterine mass. Substantial reduction in size and activity of the right pelvic sidewall lymph node. 2. The moderately hypermetabolic left adrenal mass is stable in size back through earliest available compare comparison of 12/12/2020, with roughly similar activity level to previous. The density characteristics are not specific for adrenal adenoma. Possibilities include benign and malignant adrenal neoplasms versus metastatic lesion. 3. Other imaging findings of potential clinical significance: Left mastoid effusion. Coronary and aortic atherosclerosis. Systemic atherosclerosis. Mild cardiomegaly. Lax anterior abdominal wall. Suspected cholelithiasis. Sigmoid colon diverticulosis. Congenital anomalies of the C1 vertebra. Anterolisthesis at L4-5.  Hypermetabolic left adrenal mass has been worked up for possible pheochromocytoma which was negative. She was referred to urology and saw Dr. Bernardo Heater who recommended CT guided biopsy but this was refused by IR.   She has been followed by Dr. Tasia Catchings for iron deficiency anemia secondary to blood loss in setting of endometrial cancer. She received IV iron and palliative radiation. Bleeding has stopped and hemoglobin has improved to 11.4. Blood sugars have been elevated and last HmgA1c was 7.9 (07/18/21)  Her case was discussed at Tumor Board. Dr. Fransisca Connors felt like she would be a  candidate for surgery. After further discussion she opted for non-surgical management.     Problem List: Patient Active Problem List  Diagnosis Date Noted   Encounter for antineoplastic chemotherapy 08/24/2021   Morbid obesity with body mass index (BMI) of 40.0 to 44.9 in adult ([place]) 08/24/2021   Anemia due to antineoplastic chemotherapy 08/24/2021   Drug-induced constipation 08/23/2021   Port-A-Cath in place 08/15/2021   Venous insufficiency of both lower extremities 05/20/2021   Hepatomegaly 04/14/2021   Endometrial adenocarcinoma ([place]) 04/14/2021   Adrenal mass ([place]) 04/14/2021   Goals of care, counseling/discussion 04/14/2021   B12 deficiency 04/11/2021   AKI (acute kidney injury) ([place]) 04/01/2021   Essential hypertension 04/01/2021   Insulin dependent type 2 diabetes mellitus ([place]) 04/01/2021   Depression 04/01/2021   Recent unintentional weight loss over several months 04/01/2021   History of asthma 04/01/2021   Bilateral lower extremity edema 04/01/2021     Past Medical History: Past Medical History:  Diagnosis Date   Asthma    Cancer ([place])    Depression 04/01/2021   Diabetes mellitus without complication ([place])    Essential hypertension 04/01/2021   Hx of dysplastic nevus 07/16/2010   RLQA   Hypertension    Insulin dependent type 2 diabetes mellitus ([place]) 04/01/2021    Past Surgical History: Past Surgical History:  Procedure Laterality Date   CESAREAN SECTION  01/23/1979   IR IMAGING GUIDED PORT INSERTION  08/09/2021   TONSILLECTOMY  03/17/1956    Past Gynecologic History:  Post menopausal - 2006 STD: denies   OB History:  OB History  Gravida Para Term Preterm AB Living  1 1 1     1   SAB IAB Ectopic Multiple Live Births          1    # Outcome Date GA Lbr Len/2nd Weight Sex Delivery Anes PTL Lv  1 Term 1980     CS-Unspec   LIV    Family History: Family History  Problem Relation Age of Onset   Breast cancer Mother 2   Cancer Mother     Cancer Father    Lung cancer Father    Breast cancer Cousin        maternal side    Social History: Social History   Socioeconomic History   Marital status: Divorced    Spouse name: Not on file   Number of children: Not on file   Years of education: Not on file   Highest education level: Not on file  Occupational History   Not on file  Tobacco Use   Smoking status: Never   Smokeless tobacco: Never  Vaping Use   Vaping Use: Never used  Substance and Sexual Activity   Alcohol use: Not Currently   Drug use: Never   Sexual activity: Not Currently  Other Topics Concern   Not on file  Social History Narrative      Social Determinants of Health   Financial Resource Strain: Low Risk  (09/27/2021)   Overall Financial Resource Strain (CARDIA)    Difficulty of Paying Living Expenses: Not very hard  Food Insecurity: No Food Insecurity (09/27/2021)   Hunger Vital Sign    Worried About Running Out of Food in the Last Year: Never true    Ran Out of Food in the Last Year: Never true  Transportation Needs: No Transportation Needs (09/27/2021)   PRAPARE - Hydrologist (Medical): No    Lack of Transportation (Non-Medical): No  Physical Activity: Inactive (09/27/2021)   Exercise Vital Sign    Days of Exercise per Week: 0 days    Minutes  of Exercise per Session: 0 min  Stress: Stress Concern Present (09/27/2021)   [place]    Feeling of Stress : Rather much  Social Connections: Moderately Integrated (09/27/2021)   Social Connection and Isolation Panel [NHANES]    Frequency of Communication with Friends and Family: Three times a week    Frequency of Social Gatherings with Friends and Family: Three times a week    Attends Religious Services: 1 to 4 times per year    Active Member of Clubs or Organizations: No    Attends Archivist Meetings: 1 to 4 times per year    Marital Status:  Divorced  Intimate Partner Violence: Not At Risk (09/27/2021)   Humiliation, Afraid, Rape, and Kick questionnaire    Fear of Current or Ex-Partner: No    Emotionally Abused: No    Physically Abused: No    Sexually Abused: No    Allergies: Allergies  Allergen Reactions   Aspirin Rash    Childhood reaction     Current Medications: Current Outpatient Medications  Medication Sig Dispense Refill   acetaminophen (TYLENOL) 650 MG CR tablet Take 1,300 mg by mouth every 8 (eight) hours as needed for pain.     albuterol (VENTOLIN HFA) 108 (90 Base) MCG/ACT inhaler Inhale 2 puffs into the lungs every 6 (six) hours as needed for wheezing or shortness of breath.     Cranberry-Vitamin C-Probiotic (AZO CRANBERRY PO) Take by mouth.     docusate sodium (COLACE) 100 MG capsule Take 1 capsule (100 mg total) by mouth 2 (two) times daily. 60 capsule 1   empagliflozin (JARDIANCE) 25 MG TABS tablet Take 25 mg by mouth daily.     ferrous sulfate 325 (65 FE) MG EC tablet Take 325 mg by mouth daily with breakfast.     FLUoxetine (PROZAC) 10 MG capsule Take 10 mg by mouth daily.     fluticasone (FLONASE) 50 MCG/ACT nasal spray Place 1 spray into both nostrils daily as needed for allergies or rhinitis.     hydrochlorothiazide (HYDRODIURIL) 12.5 MG tablet Take 12.5 mg by mouth daily.     hydrocortisone 2.5 % cream Apply topically 2 (two) times daily. 30 g 2   ibuprofen (ADVIL) 200 MG tablet Take 400 mg by mouth every 8 (eight) hours as needed for moderate pain.     lidocaine-prilocaine (EMLA) cream Apply to affected area once 30 g 3   lisinopril (ZESTRIL) 40 MG tablet Take 40 mg by mouth at bedtime.     LORazepam (ATIVAN) 0.5 MG tablet Take 1 tablet (0.5 mg total) by mouth every 8 (eight) hours as needed for anxiety (nausea vomiting). 30 tablet 0   metFORMIN (GLUCOPHAGE-XR) 500 MG 24 hr tablet Take 1,000 mg by mouth at bedtime.     metoprolol succinate (TOPROL-XL) 25 MG 24 hr tablet Take 25 mg by mouth daily.      NOVOLIN 70/30 FLEXPEN (70-30) 100 UNIT/ML KwikPen 50-110 Units See admin instructions. 50 units in the morning, 110 units at bedtime     ondansetron (ZOFRAN) 8 MG tablet Take 1 tablet (8 mg total) by mouth 2 (two) times daily as needed. Start on the third day after chemotherapy. 30 tablet 1   OZEMPIC, 1 MG/DOSE, 4 MG/3ML SOPN Inject 1 mg into the skin every Tuesday.     prochlorperazine (COMPAZINE) 10 MG tablet Take 1 tablet (10 mg total) by mouth every 6 (six) hours as needed (Nausea or vomiting). [place]  tablet 1   rosuvastatin (CRESTOR) 10 MG tablet Take 10 mg by mouth daily.     traMADol (ULTRAM) 50 MG tablet Take 2 tablets (100 mg total) by mouth every 6 (six) hours as needed. 30 tablet 0   No current facility-administered medications for this visit.    Review of Systems General:  fatigue, weakness Skin: no complaints Eyes: no complaints HEENT: no complaints Breasts: no complaints Pulmonary: shortness of breath Cardiac: bilateral leg swelling (improved) Gastrointestinal: no complaints Genitourinary/Sexual: no complaints Ob/Gyn: no complaints Musculoskeletal: no complaints Hematology: no complaints Neurologic/Psych: depression   Objective:  Physical Examination:  BP (!) 148/77 (BP Location: Left Arm, Patient Position: Sitting)   Pulse 90   Resp 18   Wt 276 lb (125.2 kg)   BMI 41.97 kg/m    ECOG performance status: 2  GENERAL: Patient is a well appearing female in no acute distress. Obese HEENT:  Sclerae anicteric. Neck is supple.  NODES:  No cervical, supraclavicular, or axillary lymphadenopathy palpated.  LUNGS:  Clear to auscultation bilaterally.  No wheezes or rhonchi. HEART:  Regular rate and rhythm. Holosystolic murmur auscultated  ABDOMEN:  Soft, nontender.  Positive, normoactive bowel sounds. No organomegaly palpated.  NEURO:  Nonfocal. Well oriented.  Appropriate affect.  Pelvic: Exam Chaperoned by RN. The exam is challenging for her to lay down and she felt  short of breath and dizzy with the positioning. She could not move to the end of the table.  EGBUS: no lesions Cervix: no lesions, nontender, mobile Vagina: no lesions, no discharge or bleeding Uterus: unable to determine size - does not seem to be enlarged, nontender, mobile Adnexa: no palpable masses Rectovaginal: deferred     Lab Review Labs on site today: Lab Results  Component Value Date   WBC 5.1 09/27/2021   HGB 10.3 (L) 09/27/2021   HCT 31.8 (L) 09/27/2021   MCV 93.3 09/27/2021   PLT 189 09/27/2021      Latest Ref Rng & Units 09/27/2021    8:34 AM 09/06/2021    8:08 AM 08/23/2021   10:00 AM  CMP  Glucose 70 - 99 mg/dL 212  232  177   BUN 8 - 23 mg/dL 25  21  29    Creatinine 0.44 - 1.00 mg/dL 0.81  0.94  0.91   Sodium 135 - 145 mmol/L 139  139  139   Potassium 3.5 - 5.1 mmol/L 4.2  3.8  3.4   Chloride 98 - 111 mmol/L 104  105  105   CO2 22 - 32 mmol/L 24  24  24    Calcium 8.9 - 10.3 mg/dL 9.8  9.0  8.9   Total Protein 6.5 - 8.1 g/dL 7.2  7.1  7.1   Total Bilirubin 0.3 - 1.2 mg/dL 0.3  0.4  0.4   Alkaline Phos 38 - 126 U/L 63  61  64   AST 15 - 41 U/L 20  24  21    ALT 0 - 44 U/L 13  19  14        Latest Ref Rng & Units 09/27/2021    8:34 AM 09/06/2021    8:08 AM 08/23/2021   10:00 AM  Hepatic Function  Total Protein 6.5 - 8.1 g/dL 7.2  7.1  7.1   Albumin 3.5 - 5.0 g/dL 3.7  3.8  3.7   AST 15 - 41 U/L 20  24  21    ALT 0 - 44 U/L 13  19  14    Alk  Phosphatase 38 - 126 U/L 63  61  64   Total Bilirubin 0.3 - 1.2 mg/dL 0.3  0.4  0.4      Radiologic Imaging: Per HPI   Assessment:  Valerie Case is a 71 y.o. female diagnosed with metastatic poorly differentiated endometrial adenocarcinoma (MSI-H) with right sidewall and pelvic lymph node involvement s/p palliative WPRT d/t bleeding with evidence of completed response based on imaging.   Initial performance status = 2 poor overall performance status and travels in a wheel chair. Not optimal surgical candidate.    Adrenal mass, uncertain etiology. Pheocromocytoma workup was negative. Biopsy recommended by urology but denied by IR. Radiology recommended adrenal MRI.   History of symptomatic anemia due to bleeding and s/p blood transfusion and IV iron.   History of electrolyte abnormalities including hypokalemia, hypocalcemia, resolved   Renal insufficiency (2.6 on 04/01/2021)  AODM, concern for suboptimal glycemic control.   Medical co-morbidities complicating care: HTN, AODM, depression, morbid obesity, Chronic venous insufficiency of both lower extremities with h/o cellulitis, History of asthma. Body mass index is 41.97 kg/m.  Plan:   Problem List Items Addressed This Visit       Genitourinary   Endometrial adenocarcinoma ([place]) - Primary (Chronic)   Other Visit Diagnoses     Counseling and coordination of care          We reviewed all the findings and discussed options. I have recommended consideration of 2 more cycles of chemotherapy and adding immunotherapy given MSI-H tumor and based on [id number] and RUBY trials followed by maintenance immunotherapy.  I defer to Dr. Tasia Catchings.  She is seeing Dr. Tasia Catchings this Friday.  The question of surgery is uncertain. She is not interested in surgery at this time.  The benefit of surgery may be less than her risk given that she has had a complete response to therapy.  We will request MMR testing on her biopsy.  After reviewing her tumor testing we also recommended genetic counseling for genetic testing  Follow-up in 3 months for continued surveillance.  The patient's diagnosis, an outline of the further diagnostic and laboratory studies which will be required, the recommendation, and alternatives were discussed.  All questions were answered to the patient's satisfaction. Beckey Rutter, NP scribed the note.   We spent a total of at last 40 minutes were spent with the patient/family or documentation completion today; >50% was spent in education, counseling and  coordination of care for endometrial cancer. Medical decision making complex.  I also had a chance to speak with Dr. Tasia Catchings about adding immunotherapy.  I personally had a face to face interaction and evaluated the patient; performed the physical exam, determined assessment and plan. Counseling was completed by me.    Valerie Rabadi Gaetana Michaelis, MD

## 2021-10-17 ENCOUNTER — Other Ambulatory Visit: Payer: Self-pay

## 2021-10-17 ENCOUNTER — Telehealth: Payer: Self-pay | Admitting: Nurse Practitioner

## 2021-10-17 MED FILL — Dexamethasone Sodium Phosphate Inj 100 MG/10ML: INTRAMUSCULAR | Qty: 1 | Status: AC

## 2021-10-17 NOTE — Telephone Encounter (Signed)
Spoke to patient to review Dr. Gershon Crane recommendation for genetic testing. She agrees to proceed. Referral has been sent to [facility].

## 2021-10-18 ENCOUNTER — Other Ambulatory Visit: Payer: Self-pay

## 2021-10-18 ENCOUNTER — Inpatient Hospital Stay: Payer: Medicare PPO

## 2021-10-18 ENCOUNTER — Inpatient Hospital Stay (HOSPITAL_BASED_OUTPATIENT_CLINIC_OR_DEPARTMENT_OTHER): Payer: Medicare PPO | Admitting: Oncology

## 2021-10-18 ENCOUNTER — Encounter: Payer: Self-pay | Admitting: Oncology

## 2021-10-18 VITALS — BP 152/82 | HR 95 | Temp 99.2°F | Resp 16 | Wt 277.7 lb

## 2021-10-18 DIAGNOSIS — E278 Other specified disorders of adrenal gland: Secondary | ICD-10-CM

## 2021-10-18 DIAGNOSIS — R160 Hepatomegaly, not elsewhere classified: Secondary | ICD-10-CM | POA: Diagnosis not present

## 2021-10-18 DIAGNOSIS — C541 Malignant neoplasm of endometrium: Secondary | ICD-10-CM

## 2021-10-18 DIAGNOSIS — D6481 Anemia due to antineoplastic chemotherapy: Secondary | ICD-10-CM | POA: Diagnosis not present

## 2021-10-18 DIAGNOSIS — Z5111 Encounter for antineoplastic chemotherapy: Secondary | ICD-10-CM

## 2021-10-18 DIAGNOSIS — Z6841 Body Mass Index (BMI) 40.0 and over, adult: Secondary | ICD-10-CM

## 2021-10-18 DIAGNOSIS — T451X5A Adverse effect of antineoplastic and immunosuppressive drugs, initial encounter: Secondary | ICD-10-CM

## 2021-10-18 DIAGNOSIS — Z5112 Encounter for antineoplastic immunotherapy: Secondary | ICD-10-CM | POA: Diagnosis not present

## 2021-10-18 LAB — COMPREHENSIVE METABOLIC PANEL
ALT: 15 U/L (ref 0–44)
AST: 22 U/L (ref 15–41)
Albumin: 3.7 g/dL (ref 3.5–5.0)
Alkaline Phosphatase: 66 U/L (ref 38–126)
Anion gap: 11 (ref 5–15)
BUN: 19 mg/dL (ref 8–23)
CO2: 26 mmol/L (ref 22–32)
Calcium: 10.4 mg/dL — ABNORMAL HIGH (ref 8.9–10.3)
Chloride: 104 mmol/L (ref 98–111)
Creatinine, Ser: 0.81 mg/dL (ref 0.44–1.00)
GFR, Estimated: >60 mL/min (ref >60)
Glucose, Bld: 140 mg/dL — ABNORMAL HIGH (ref 70–99)
Potassium: 3.7 mmol/L (ref 3.5–5.1)
Sodium: 141 mmol/L (ref 135–145)
Total Bilirubin: 0.2 mg/dL — ABNORMAL LOW (ref 0.3–1.2)
Total Protein: 7.2 g/dL (ref 6.5–8.1)

## 2021-10-18 LAB — CBC WITH DIFFERENTIAL/PLATELET
Abs Immature Granulocytes: 0.02 10*3/uL (ref 0.00–0.07)
Basophils Absolute: 0 10*3/uL (ref 0.0–0.1)
Basophils Relative: 1 %
Eosinophils Absolute: 0.1 10*3/uL (ref 0.0–0.5)
Eosinophils Relative: 2 %
HCT: 30.2 % — ABNORMAL LOW (ref 36.0–46.0)
Hemoglobin: 9.8 g/dL — ABNORMAL LOW (ref 12.0–15.0)
Immature Granulocytes: 0 %
Lymphocytes Relative: 13 %
Lymphs Abs: 0.6 10*3/uL — ABNORMAL LOW (ref 0.7–4.0)
MCH: 31.2 pg (ref 26.0–34.0)
MCHC: 32.5 g/dL (ref 30.0–36.0)
MCV: 96.2 fL (ref 80.0–100.0)
Monocytes Absolute: 0.5 10*3/uL (ref 0.1–1.0)
Monocytes Relative: 11 %
Neutro Abs: 3.5 10*3/uL (ref 1.7–7.7)
Neutrophils Relative %: 73 %
Platelets: 147 10*3/uL — ABNORMAL LOW (ref 150–400)
RBC: 3.14 MIL/uL — ABNORMAL LOW (ref 3.87–5.11)
RDW: 19.2 % — ABNORMAL HIGH (ref 11.5–15.5)
WBC: 4.7 10*3/uL (ref 4.0–10.5)
nRBC: 0 % (ref 0.0–0.2)

## 2021-10-18 LAB — TSH: TSH: 1.329 u[IU]/mL (ref 0.350–4.500)

## 2021-10-18 LAB — T4, FREE: Free T4: 0.7 ng/dL (ref 0.61–1.12)

## 2021-10-18 MED ORDER — FAMOTIDINE IN NACL 20-0.9 MG/50ML-% IV SOLN
20.0000 mg | Freq: Once | INTRAVENOUS | Status: AC
  Administered 2021-10-18: 20 mg via INTRAVENOUS
  Filled 2021-10-18: qty 50

## 2021-10-18 MED ORDER — SODIUM CHLORIDE 0.9 % IV SOLN
200.0000 mg | Freq: Once | INTRAVENOUS | Status: AC
  Administered 2021-10-18: 200 mg via INTRAVENOUS
  Filled 2021-10-18: qty 8

## 2021-10-18 MED ORDER — SODIUM CHLORIDE 0.9 % IV SOLN
150.0000 mg/m2 | Freq: Once | INTRAVENOUS | Status: AC
  Administered 2021-10-18: 372 mg via INTRAVENOUS
  Filled 2021-10-18: qty 62

## 2021-10-18 MED ORDER — SODIUM CHLORIDE 0.9 % IV SOLN
150.0000 mg | Freq: Once | INTRAVENOUS | Status: AC
  Administered 2021-10-18: 150 mg via INTRAVENOUS
  Filled 2021-10-18: qty 150

## 2021-10-18 MED ORDER — DIPHENHYDRAMINE HCL 50 MG/ML IJ SOLN
50.0000 mg | Freq: Once | INTRAMUSCULAR | Status: AC
  Administered 2021-10-18: 50 mg via INTRAVENOUS
  Filled 2021-10-18: qty 1

## 2021-10-18 MED ORDER — SODIUM CHLORIDE 0.9 % IV SOLN
10.0000 mg | Freq: Once | INTRAVENOUS | Status: AC
  Administered 2021-10-18: 10 mg via INTRAVENOUS
  Filled 2021-10-18: qty 10

## 2021-10-18 MED ORDER — PALONOSETRON HCL INJECTION 0.25 MG/5ML
0.2500 mg | Freq: Once | INTRAVENOUS | Status: AC
  Administered 2021-10-18: 0.25 mg via INTRAVENOUS
  Filled 2021-10-18: qty 5

## 2021-10-18 MED ORDER — SODIUM CHLORIDE 0.9 % IV SOLN
Freq: Once | INTRAVENOUS | Status: AC
  Filled 2021-10-18: qty 250

## 2021-10-18 MED ORDER — SODIUM CHLORIDE 0.9 % IV SOLN
652.0000 mg | Freq: Once | INTRAVENOUS | Status: AC
  Administered 2021-10-18: 650 mg via INTRAVENOUS
  Filled 2021-10-18: qty 65

## 2021-10-18 MED ORDER — HEPARIN SOD (PORK) LOCK FLUSH 100 UNIT/ML IV SOLN
500.0000 [IU] | Freq: Once | INTRAVENOUS | Status: AC | PRN
  Administered 2021-10-18: 500 [IU]
  Filled 2021-10-18: qty 5

## 2021-10-18 NOTE — Progress Notes (Signed)
Pt in for follow up, seen by Dr Theora Gianotti on Wednesday in GYN clinic.

## 2021-10-18 NOTE — Progress Notes (Signed)
Nutrition Follow-up:  Patient with anemia and endometrial cancer, followed by Dr Tasia Catchings.  Patient received radiation 04/24/21-05/01/21.  Patient receiving carbo and taxol.    Met with patient during infusion.  Patient reports that she has connected with LCSW and that has been helping her.  Is not looking forward to more chemotherapy treatments.  Says that she is eating mini-meals.  Has had some nausea but has not been as nauseated as she thought she would be.  Yesterday able to eat 2 scrambled eggs, bacon, toast, coffee and juice.  Lunch yesterday was boost and protein bar.  Supper was rice with gravy and cubed steak and water.  Bowels are moving normally.      Medications: reviewed  Labs: reviewed  Anthropometrics:   Weight 277 lb today  279 lb on 7/14 279 lb on 6/23 289 lb on 6/1 292 lb on 10/31   NUTRITION DIAGNOSIS: Inadequate oral intake stable   INTERVENTION:  Encouraged continued follow-up with LCSW.   Encouraged well balanced diet including lean proteins and plant foods     MONITORING, EVALUATION, GOAL: weight trends, intake   NEXT VISIT: as needed  Valerie Case B. Zenia Resides, [place], [place] Registered Dietitian [phone]

## 2021-10-18 NOTE — Assessment & Plan Note (Signed)
lipid poor hyperfunctioning adenoma vs mets.  FDG avid on PET scan.  Difficult biopsy due to patient's body habitus and deep position of the mass. Resolved on current PET scan.  Indicating this lesion might be a metastatic lesion.

## 2021-10-18 NOTE — Progress Notes (Signed)
Hematology/Oncology Progress note [phone] [phone]         Patient Care Team: Tracie Harrier, MD as PCP - General (Internal Medicine) Cammie Sickle, MD as Consulting Physician (Oncology)   ASSESSMENT & PLAN:   Endometrial adenocarcinoma Valerie Case) s/p carboplatin and Taxol for 3 cycles, PET scan showed complete response.  Imaging was reviewed and discussed with patient.  No residual hypermetabolism.  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 Patient was evaluated by Dr. Theora Gianotti.  No plan for surgery.  We agree on proceeding with 2 cycles of chemotherapy, adding immunotherapy Keytruda with chemotherapy as well as maintenance.  Rationale and potential side effects of immunotherapy Keytruda were reviewed and discussed with patient.  She agrees with the plan. Labs are reviewed and discussed with patient. Proceed with cycle 4 carboplatin and Taxol and Keytruda with D4 Udenyca.     Anemia due to antineoplastic chemotherapy Close monitor patient's counts.  Repeat CBC in 10 days, plan for possible PRBC transfusion  Morbid obesity with body mass index (BMI) of 40.0 to 44.9 in adult [facility]) discussed about lifestyle modification, healthy diet and exercise. Follow up with nutritionist  Adrenal mass ([place]) lipid poor hyperfunctioning adenoma vs mets.  FDG avid on PET scan.  Difficult biopsy due to patient's body habitus and deep position of the mass. Resolved on current PET scan.  Indicating this lesion might be a metastatic lesion.  Encounter for antineoplastic chemotherapy Treatment as planned above  Orders Placed This Encounter  Procedures   CBC with Differential/Platelet    Standing Status:   Future    Standing Expiration Date:   10/19/2022   Hold Tube- Blood Bank    Standing Status:   Future    Standing Expiration Date:   10/19/2022   Follow up in 3 weeks.  All questions were answered. The patient knows to call the  clinic with any problems, questions or concerns.  Earlie Server, MD, [name] [facility] Hematology Oncology 10/18/2021     CHIEF COMPLAINTS/REASON FOR VISIT:  anemia and endometrial cancer   ASSESSMENT & PLAN:  Endometrial adenocarcinoma ([place]) s/p carboplatin and Taxol for 3 cycles, PET scan showed complete response.  Imaging was reviewed and discussed with patient.  No residual hypermetabolism.  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 Patient was evaluated by Dr. Theora Gianotti.  No plan for surgery.  We agree on proceeding with 2 cycles of chemotherapy, adding immunotherapy Keytruda with chemotherapy as well as maintenance.  Rationale and potential side effects of immunotherapy Keytruda were reviewed and discussed with patient.  She agrees with the plan. Labs are reviewed and discussed with patient. Proceed with cycle 4 carboplatin and Taxol and Keytruda with D4 Udenyca.     Anemia due to antineoplastic chemotherapy Close monitor patient's counts.  Repeat CBC in 10 days, plan for possible PRBC transfusion  Morbid obesity with body mass index (BMI) of 40.0 to 44.9 in adult [facility]) discussed about lifestyle modification, healthy diet and exercise. Follow up with nutritionist  Adrenal mass ([place]) lipid poor hyperfunctioning adenoma vs mets.  FDG avid on PET scan.  Difficult biopsy due to patient's body habitus and deep position of the mass. Resolved on current PET scan.  Indicating this lesion might be a metastatic lesion.  Encounter for antineoplastic chemotherapy Treatment as planned above  She tolerates well. Plan to increase Taxol to 156m/m2  Return of visit:  10 days lab possible PRBC transfusion 3 weeks Lab MD carboplatin Taxol  Keytruda- D4 Udenyca.    Earlie Server, MD, [name] [facility] Hematology Oncology 10/18/2021     HISTORY OF PRESENTING ILLNESS:   Valerie Case is a  71 y.o.  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 Oncology History  Endometrial adenocarcinoma ([place])  12/12/2020 Imaging   12/12/2020, CT abdomen pelvis with contrast showed no radiographic evidence of urinary tract neoplasm, calculi, hydronephrosis.  2.6 cm nonspecific left adrenal mass. 02/12/2021 CT hematuria work-up showed small uterine fibroids, no findings to explain hematuria.Hepatomegaly, diverticulosis without evidence of diverticulitis, Coronary artery disease   04/01/2021 -  Case Admission   04/01/2021 - 04/03/2021, patient was hospitalized due to symptomatic anemia, hemoglobin 6.9, heavy postmenopausal bleeding with passing large clots.  She also presented with increased creatinine level to 2.58 compared to her baseline level of 0.9 in November 2022.  Patient received IV iron infusion and 2 units of PRBC during the Case stay..  04/02/2021, iron panel showed iron saturation 37, ferritin 37, TIBC 333-the studies were done after patient received blood transfusion.  At discharge, hemoglobin was 7.7.   04/01/2021 Initial Diagnosis   04/01/2020, endometrial biopsy showed poorly differentiated endometrial adenocarcinoma. Omniseq NGS showed TMB 48.4 mt/mb [high], MSI High, PD-L1-TPS <1%, PIK3CA S0109N, Negative for BRAF, HER2, NTRK1 RET.     04/10/2021 Cancer Staging   Staging form: Corpus Uteri - Carcinoma and Carcinosarcoma, AJCC 8th Edition - Clinical stage from 04/10/2021: FIGO Stage III, calculated as Stage Unknown (cT3, cNX, cM0) - Signed by Earlie Server, MD on 08/24/2021 Stage prefix: Initial diagnosis   04/15/2021 Imaging   PET showed 1. Large hypermetabolic endometrial mass consistent with known endometrial cancer. Suspect direct invasion/involvement of the right adnexa. Right pelvic sidewall hypermetabolic adenopathy.2. 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 3. No findings for metastatic disease  involving the chest or bonystructures.     04/24/2021 - 05/29/2021 Radiation Therapy   status post radiation to pelvis   08/05/2021 Imaging   MRI abdomen w wo contrast 1. Stable solid enhancing 2.7 cm left adrenal lesion, which was hypermetabolic on prior PET/CTs but is unchanged in size dating back to December 12, 2020. 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 However, unfortunately metastatic disease can not be entirely excluded and remains a pertinent differential consideration. Comparison with more remote prior imaging would be the most valuable tool in the assessment of this lesion, as demonstrating long-term stability would indicate this to be a benign lesion. However, if no prior imaging can be made available, would consider follow-up adrenal protocol CT with and without intravenous contrast material in 3 months as this would allow for further assessment of stability as well as enhancement and washout characteristics of the lesion potentially allowing for better characterization versus direct tissue sampling.2. Hepatomegaly and hepatic steatosis.3. Colonic diverticulosis without findings of acute diverticulitis    08/16/2021 -  Chemotherapy   Patient is on Treatment Plan : UTERINE Carboplatin AUC 6 / Paclitaxel q21d     10/03/2021 Imaging   PET 1. No residual hypermetabolism in the uterus suggesting an excellent response to treatment. No findings for metastatic disease. 2. Resolution of hypermetabolism in the left adrenal gland lesion suggesting this was metastatic disease. 3. Diffuse marrow hypermetabolism likely due to rebound from  chemotherapy or marrow stimulating drugs       INTERVAL HISTORY Valerie Case is a 71 y.o. female who has above history reviewed by me today presents for follow up visit for anemia and  endometrial cancer Status post 3 cycles of chemotherapy. Interval PET scan showed very good response.  No residual hypermetabolic area Patient was seen by gynecology oncology.  Benefit of surgery is uncertain. The patient has no new complaints.  Chronic fatigue unchanged.  Review of Systems  Constitutional:  Positive for fatigue. Negative for chills and fever.  HENT:   Negative for hearing loss and voice change.   Eyes:  Negative for eye problems.  Respiratory:  Negative for chest tightness and cough.   Cardiovascular:  Negative for chest pain.  Gastrointestinal:  Negative for abdominal distention, abdominal pain and blood in stool.  Endocrine: Negative for hot flashes.  Genitourinary:  Negative for difficulty urinating, frequency and vaginal bleeding.   Musculoskeletal:  Positive for arthralgias.  Skin:  Negative for itching and rash.  Neurological:  Negative for extremity weakness.  Hematological:  Negative for adenopathy.  Psychiatric/Behavioral:  Negative for confusion.     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    SURGICAL HISTORY: Past Surgical History:  Procedure Laterality Date   CESAREAN SECTION  01/23/1979   IR IMAGING GUIDED PORT INSERTION  08/09/2021   TONSILLECTOMY  03/17/1956    SOCIAL HISTORY: Social History   Socioeconomic History   Marital status: Divorced    Spouse name: Not on file   Number of children: Not on file   Years of education: Not on file   Highest education level: Not on file  Occupational History   Not on file  Tobacco Use   Smoking status: Never   Smokeless tobacco: Never  Vaping Use   Vaping Use: Never used  Substance and Sexual Activity   Alcohol use: Not  Currently   Drug use: Never   Sexual activity: Not Currently  Other Topics Concern   Not on file  Social History Narrative      Social Determinants of Health   Financial Resource Strain: Low Risk  (09/27/2021)   Overall Financial Resource Strain (CARDIA)    Difficulty of Paying Living Expenses: Not very hard  Food Insecurity: No Food Insecurity (09/27/2021)   Hunger Vital Sign    Worried About Running Out of Food in the Last Year: Never true    [place] in the Last Year: Never true  Transportation Needs: No Transportation Needs (09/27/2021)   PRAPARE - Hydrologist (Medical): No    Lack of Transportation (Non-Medical): No  Physical Activity: Inactive (09/27/2021)   Exercise Vital Sign    Days of Exercise per Week: 0 days    Minutes of Exercise per Session: 0 min  Stress: Stress Concern Present (09/27/2021)   [place]    Feeling of Stress : Rather much  Social Connections: Moderately Integrated (09/27/2021)   Social Connection and Isolation Panel [NHANES]    Frequency of Communication with Friends and Family: Three times a week    Frequency of Social Gatherings with  Friends and Family: Three times a week    Attends Religious Services: 1 to 4 times per year    Active Member of Clubs or Organizations: No    Attends Archivist Meetings: 1 to 4 times per year    Marital Status: Divorced  Intimate Partner Violence: Not At Risk (09/27/2021)   Humiliation, Afraid, Rape, and Kick questionnaire    Fear of Current or Ex-Partner: No    Emotionally Abused: No    Physically Abused: No    Sexually Abused: No    FAMILY HISTORY: Family History  Problem Relation Age of Onset   Breast cancer Mother 54   Cancer Mother    Cancer Father    Lung cancer Father    Breast cancer Cousin        maternal side    ALLERGIES:  is allergic to aspirin.  MEDICATIONS:  Current Outpatient  Medications  Medication Sig Dispense Refill   acetaminophen (TYLENOL) 650 MG CR tablet Take 1,300 mg by mouth every 8 (eight) hours as needed for pain.     albuterol (VENTOLIN HFA) 108 (90 Base) MCG/ACT inhaler Inhale 2 puffs into the lungs every 6 (six) hours as needed for wheezing or shortness of breath.     Cranberry-Vitamin C-Probiotic (AZO CRANBERRY PO) Take by mouth.     docusate sodium (COLACE) 100 MG capsule Take 1 capsule (100 mg total) by mouth 2 (two) times daily. 60 capsule 1   empagliflozin (JARDIANCE) 25 MG TABS tablet Take 25 mg by mouth daily.     ferrous sulfate 325 (65 FE) MG EC tablet Take 325 mg by mouth daily with breakfast.     FLUoxetine (PROZAC) 10 MG capsule Take 10 mg by mouth daily.     fluticasone (FLONASE) 50 MCG/ACT nasal spray Place 1 spray into both nostrils daily as needed for allergies or rhinitis.     hydrochlorothiazide (HYDRODIURIL) 12.5 MG tablet Take 12.5 mg by mouth daily.     hydrocortisone 2.5 % cream Apply topically 2 (two) times daily. 30 g 2   ibuprofen (ADVIL) 200 MG tablet Take 400 mg by mouth every 8 (eight) hours as needed for moderate pain.     lidocaine-prilocaine (EMLA) cream Apply to affected area once 30 g 3   lisinopril (ZESTRIL) 40 MG tablet Take 40 mg by mouth at bedtime.     LORazepam (ATIVAN) 0.5 MG tablet Take 1 tablet (0.5 mg total) by mouth every 8 (eight) hours as needed for anxiety (nausea vomiting). 30 tablet 0   metFORMIN (GLUCOPHAGE-XR) 500 MG 24 hr tablet Take 1,000 mg by mouth at bedtime.     metoprolol succinate (TOPROL-XL) 25 MG 24 hr tablet Take 25 mg by mouth daily.     NOVOLIN 70/30 FLEXPEN (70-30) 100 UNIT/ML KwikPen 50-110 Units See admin instructions. 50 units in the morning, 110 units at bedtime     ondansetron (ZOFRAN) 8 MG tablet Take 1 tablet (8 mg total) by mouth 2 (two) times daily as needed. Start on the third day after chemotherapy. 30 tablet 1   OZEMPIC, 1 MG/DOSE, 4 MG/3ML SOPN Inject 1 mg into the skin every  Tuesday.     prochlorperazine (COMPAZINE) 10 MG tablet Take 1 tablet (10 mg total) by mouth every 6 (six) hours as needed (Nausea or vomiting). 30 tablet 1   rosuvastatin (CRESTOR) 10 MG tablet Take 10 mg by mouth daily.     traMADol (ULTRAM) 50 MG tablet Take 2 tablets (100 mg total)  by mouth every 6 (six) hours as needed. 30 tablet 0   No current facility-administered medications for this visit.   Facility-Administered Medications Ordered in Other Visits  Medication Dose Route Frequency Provider Last Rate Last Admin   CARBOplatin (PARAPLATIN) 650 mg in sodium chloride 0.9 % 250 mL chemo infusion  650 mg Intravenous Once Earlie Server, MD       heparin lock flush 100 unit/mL  500 Units Intracatheter Once PRN Earlie Server, MD       PACLitaxel (TAXOL) 372 mg in sodium chloride 0.9 % 500 mL chemo infusion (> 61m/m2)  150 mg/m2 (Treatment Plan Recorded) Intravenous Once YEarlie Server MD 187 mL/hr at 10/18/21 1148 372 mg at 10/18/21 1148     PHYSICAL EXAMINATION: ECOG PERFORMANCE STATUS: 1 - Symptomatic but completely ambulatory  Physical Exam Constitutional:      General: She is not in acute distress.    Appearance: She is obese.  HENT:     Head: Normocephalic and atraumatic.  Eyes:     General: No scleral icterus. Cardiovascular:     Rate and Rhythm: Normal rate and regular rhythm.     Heart sounds: Normal heart sounds.  Pulmonary:     Effort: Pulmonary effort is normal. No respiratory distress.     Breath sounds: No wheezing.  Abdominal:     General: Bowel sounds are normal. There is no distension.     Palpations: Abdomen is soft.  Musculoskeletal:        General: No deformity. Normal range of motion.     Cervical back: Normal range of motion and neck supple.     Right lower leg: Edema present.     Left lower leg: Edema present.     Comments:    Skin:    General: Skin is warm and dry.     Findings: No erythema or rash.     Comments: Chronic venous sufficiency skin changes   Neurological:     Mental Status: She is alert and oriented to person, place, and time. Mental status is at baseline.     Cranial Nerves: No cranial nerve deficit.     Coordination: Coordination normal.  Psychiatric:        Mood and Affect: Mood normal.      LABORATORY DATA:  I have reviewed the data as listed Lab Results  Component Value Date   WBC 4.7 10/18/2021   HGB 9.8 (L) 10/18/2021   HCT 30.2 (L) 10/18/2021   MCV 96.2 10/18/2021   PLT 147 (L) 10/18/2021   Recent Labs    04/11/21 1553 05/17/21 1024 09/06/21 0808 09/27/21 0834 10/18/21 0840  NA  --    < > 139 139 141  K  --    < > 3.8 4.2 3.7  CL  --    < > 105 104 104  CO2  --    < > _0 GLUCOSE  --    < > 232* 212* 140*  BUN  --    < > 21 25* 19  CREATININE  --    < > 0.94 0.81 0.81  CALCIUM  --    < > 9.0 9.8 10.4*  GFRNONAA  --    < > >60 >60 >60  PROT  --    < > 7.1 7.2 7.2  ALBUMIN  --    < > 3.8 3.7 3.7  AST  --    < > _1 ALT  --    < >  _0 ALKPHOS  --    < > 61 63 66  BILITOT 0.3   < > 0.4 0.3 0.2*  BILIDIR <0.1  --   --   --   --   IBILI NOT CALCULATED  --   --   --   --    < > = values in this interval not displayed.    Iron/TIBC/Ferritin/ %Sat    Component Value Date/Time   IRON 53 07/31/2021 1511   IRON 84 06/08/2012 1128   TIBC 357 07/31/2021 1511   TIBC 403 06/08/2012 1128   FERRITIN 44 07/31/2021 1511   FERRITIN 68 06/08/2012 1128   IRONPCTSAT 15 07/31/2021 1511   IRONPCTSAT 21 06/08/2012 1128      RADIOGRAPHIC STUDIES: I have personally reviewed the radiological images as listed and agreed with the findings in the report. NM PET Image Restag (PS) Skull Base To Thigh  Result Date: 10/04/2021 CLINICAL DATA:  Subsequent treatment strategy for endometrial carcinoma. EXAM: NUCLEAR MEDICINE PET SKULL BASE TO THIGH TECHNIQUE: 14.57 mCi F-18 FDG was injected intravenously. Full-ring PET imaging was performed from the skull base to thigh after the radiotracer. CT data  was obtained and used for attenuation correction and anatomic localization. Fasting blood glucose: 151 mg/dl COMPARISON:  Prior PET CTs from 04/15/2021 and 06/10/2021. Recent MRI abdomen 08/05/2021 FINDINGS: Mediastinal blood pool activity: SUV max 1.32 Liver activity: SUV max NA NECK: No hypermetabolic lymph nodes in the neck. Incidental CT findings: Right IJ catheter in good position. CHEST: No hypermetabolic mediastinal or hilar nodes. No suspicious pulmonary nodules on the CT scan. No hypermetabolic breast masses, supraclavicular or axillary adenopathy. Incidental CT findings: Stable cardiac enlargement. Stable atherosclerotic calcifications involving the aorta and coronary arteries. ABDOMEN/PELVIS: I do not see any residual hypermetabolism involving the uterus suggesting an excellent response to treatment. No enlarged or hypermetabolic pelvic lymph nodes. No evidence of omental or peritoneal surface disease. No evidence of metastatic disease involving the liver or adrenal glands. There is a stable 2.5 cm left adrenal gland lesion. This was hypermetabolic on the prior PET-CT with SUV max of 6.87 but this has completely resolved suggesting it is treated metastatic disease. Incidental CT findings: Stable large left-sided abdominal wall hernia. Stable descending and sigmoid colon diverticulosis. SKELETON: Diffuse hypermetabolic marrow suggesting rebound from chemotherapy or marrow stimulating drugs. Incidental CT findings: none IMPRESSION: 1. No residual hypermetabolism in the uterus suggesting an excellent response to treatment. No findings for metastatic disease. 2. Resolution of hypermetabolism in the left adrenal gland lesion suggesting this was metastatic disease. 3. Diffuse marrow hypermetabolism likely due to rebound from chemotherapy or marrow stimulating drugs. Electronically Signed   By: Marijo Sanes M.D.   On: 10/04/2021 11:08     cc Tracie Harrier, MD

## 2021-10-18 NOTE — Patient Instructions (Signed)
[facility] CTR AT [place]  Discharge Instructions: Thank you for choosing [facility] to provide your oncology and hematology care.  If you have a lab appointment with the [facility], please go directly to the [facility] and check in at the registration area.  Wear comfortable clothing and clothing appropriate for easy access to any Portacath or PICC line.   We strive to give you quality time with your provider. You may need to reschedule your appointment if you arrive late (15 or more minutes).  Arriving late affects you and other patients whose appointments are after yours.  Also, if you miss three or more appointments without notifying the office, you may be dismissed from the clinic at the provider's discretion.      For prescription refill requests, have your pharmacy contact our office and allow 72 hours for refills to be completed.    Today you received the following chemotherapy and/or immunotherapy agents Keytruda, Taxol and Carboplatin        To help prevent nausea and vomiting after your treatment, we encourage you to take your nausea medication as directed.  BELOW ARE SYMPTOMS THAT SHOULD BE REPORTED IMMEDIATELY: *FEVER GREATER THAN 100.4 F (38 C) OR HIGHER *CHILLS OR SWEATING *NAUSEA AND VOMITING THAT IS NOT CONTROLLED WITH YOUR NAUSEA MEDICATION *UNUSUAL SHORTNESS OF BREATH *UNUSUAL BRUISING OR BLEEDING *URINARY PROBLEMS (pain or burning when urinating, or frequent urination) *BOWEL PROBLEMS (unusual diarrhea, constipation, pain near the anus) TENDERNESS IN MOUTH AND THROAT WITH OR WITHOUT PRESENCE OF ULCERS (sore throat, sores in mouth, or a toothache) UNUSUAL RASH, SWELLING OR PAIN  UNUSUAL VAGINAL DISCHARGE OR ITCHING   Items with * indicate a potential emergency and should be followed up as soon as possible or go to the Emergency Department if any problems should occur.  Please show the CHEMOTHERAPY ALERT CARD or IMMUNOTHERAPY  ALERT CARD at check-in to the Emergency Department and triage nurse.  Should you have questions after your visit or need to cancel or reschedule your appointment, please contact [facility] [place] AT [place]  [phone] and follow the prompts.  Office hours are 8:00 a.m. to 4:30 p.m. Monday - Friday. Please note that voicemails left after 4:00 p.m. may not be returned until the following business day.  We are closed weekends and major holidays. You have access to a nurse at all times for urgent questions. Please call the main number to the clinic [phone] and follow the prompts.  For any non-urgent questions, you may also contact your provider using MyChart. We now offer e-Visits for anyone 16 and older to request care online for non-urgent symptoms. For details visit [email or web address].   Also download the MyChart app! Go to the app store, search "MyChart", open the app, select [facility], and log in with your MyChart username and password.  Masks are optional in the cancer centers. If you would like for your care team to wear a mask while they are taking care of you, please let them know. For doctor visits, patients may have with them one support person who is at least 70 years old. At this time, visitors are not allowed in the infusion area.   Pembrolizumab Injection What is this medication? PEMBROLIZUMAB (PEM broe LIZ ue mab) treats some types of cancer. It works by helping your immune system slow or stop the spread of cancer cells. It is a monoclonal antibody. This medicine may be used for other purposes; ask your health care provider  or pharmacist if you have questions. COMMON BRAND NAME(S): Keytruda What should I tell my care team before I take this medication? They need to know if you have any of these conditions: Allogeneic stem cell transplant (uses someone else's stem cells) Autoimmune diseases, such as Crohn disease, ulcerative colitis, lupus History of  chest radiation Nervous system problems, such as Guillain-Barre syndrome, myasthenia gravis Organ transplant An unusual or allergic reaction to pembrolizumab, other medications, foods, dyes, or preservatives Pregnant or trying to get pregnant Breast-feeding How should I use this medication? This medication is injected into a vein. It is given by your care team in a hospital or clinic setting. A special MedGuide will be given to you before each treatment. Be sure to read this information carefully each time. Talk to your care team about the use of this medication in children. While it may be prescribed for children as young as 6 months for selected conditions, precautions do apply. Overdosage: If you think you have taken too much of this medicine contact a poison control center or emergency room at once. NOTE: This medicine is only for you. Do not share this medicine with others. What if I miss a dose? Keep appointments for follow-up doses. It is important not to miss your dose. Call your care team if you are unable to keep an appointment. What may interact with this medication? Interactions have not been studied. This list may not describe all possible interactions. Give your health care provider a list of all the medicines, herbs, non-prescription drugs, or dietary supplements you use. Also tell them if you smoke, drink alcohol, or use illegal drugs. Some items may interact with your medicine. What should I watch for while using this medication? Your condition will be monitored carefully while you are receiving this medication. You may need blood work while taking this medication. This medication may cause serious skin reactions. They can happen weeks to months after starting the medication. Contact your care team right away if you notice fevers or flu-like symptoms with a rash. The rash may be red or purple and then turn into blisters or peeling of the skin. You may also notice a red rash with  swelling of the face, lips, or lymph nodes in your neck or under your arms. Tell your care team right away if you have any change in your eyesight. Talk to your care team if you may be pregnant. Serious birth defects can occur if you take this medication during pregnancy and for 4 months after the last dose. You will need a negative pregnancy test before starting this medication. Contraception is recommended while taking this medication and for 4 months after the last dose. Your care team can help you find the option that works for you. Do not breastfeed while taking this medication and for 4 months after the last dose. What side effects may I notice from receiving this medication? Side effects that you should report to your care team as soon as possible: Allergic reactions--skin rash, itching, hives, swelling of the face, lips, tongue, or throat Dry cough, shortness of breath or trouble breathing Eye pain, redness, irritation, or discharge with blurry or decreased vision Heart muscle inflammation--unusual weakness or fatigue, shortness of breath, chest pain, fast or irregular heartbeat, dizziness, swelling of the ankles, feet, or hands Hormone gland problems--headache, sensitivity to light, unusual weakness or fatigue, dizziness, fast or irregular heartbeat, increased sensitivity to cold or heat, excessive sweating, constipation, hair loss, increased thirst or amount of  urine, tremors or shaking, irritability Infusion reactions--chest pain, shortness of breath or trouble breathing, feeling faint or lightheaded Kidney injury (glomerulonephritis)--decrease in the amount of urine, red or dark brown urine, foamy or bubbly urine, swelling of the ankles, hands, or feet Liver injury--right upper belly pain, loss of appetite, nausea, light-colored stool, dark yellow or brown urine, yellowing skin or eyes, unusual weakness or fatigue Pain, tingling, or numbness in the hands or feet, muscle weakness, change in  vision, confusion or trouble speaking, loss of balance or coordination, trouble walking, seizures Rash, fever, and swollen lymph nodes Redness, blistering, peeling, or loosening of the skin, including inside the mouth Sudden or severe stomach pain, bloody diarrhea, fever, nausea, vomiting Side effects that usually do not require medical attention (report to your care team if they continue or are bothersome): Bone, joint, or muscle pain Diarrhea Fatigue Loss of appetite Nausea Skin rash This list may not describe all possible side effects. Call your doctor for medical advice about side effects. You may report side effects to FDA at 1-800-FDA-1088. Where should I keep my medication? This medication is given in a hospital or clinic. It will not be stored at home. NOTE: This sheet is a summary. It may not cover all possible information. If you have questions about this medicine, talk to your doctor, pharmacist, or health care provider.  2023 Elsevier/Gold Standard (2021-06-24 00:00:00)

## 2021-10-18 NOTE — Assessment & Plan Note (Signed)
Treatment as planned above. 

## 2021-10-18 NOTE — Assessment & Plan Note (Signed)
Close monitor patient's counts.  Repeat CBC in 10 days, plan for possible PRBC transfusion

## 2021-10-18 NOTE — Assessment & Plan Note (Signed)
discussed about lifestyle modification, healthy diet and exercise. Follow up with nutritionist

## 2021-10-18 NOTE — Assessment & Plan Note (Signed)
s/p carboplatin and Taxol for 3 cycles, PET scan showed complete response.  Imaging was reviewed and discussed with patient.  No residual hypermetabolism.  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 Patient was evaluated by Dr. Theora Gianotti.  No plan for surgery.  We agree on proceeding with 2 cycles of chemotherapy, adding immunotherapy Keytruda with chemotherapy as well as maintenance.  Rationale and potential side effects of immunotherapy Keytruda were reviewed and discussed with patient.  She agrees with the plan. Labs are reviewed and discussed with patient. Proceed with cycle 4 carboplatin and Taxol and Keytruda with D4 Udenyca.

## 2021-10-20 ENCOUNTER — Encounter: Payer: Self-pay | Admitting: Oncology

## 2021-10-21 ENCOUNTER — Telehealth: Payer: Self-pay | Admitting: Licensed Clinical Social Worker

## 2021-10-21 ENCOUNTER — Inpatient Hospital Stay: Payer: Medicare PPO

## 2021-10-21 DIAGNOSIS — Z5112 Encounter for antineoplastic immunotherapy: Secondary | ICD-10-CM | POA: Diagnosis not present

## 2021-10-21 DIAGNOSIS — C541 Malignant neoplasm of endometrium: Secondary | ICD-10-CM

## 2021-10-21 MED ORDER — PEGFILGRASTIM-CBQV 6 MG/0.6ML ~~LOC~~ SOSY
6.0000 mg | PREFILLED_SYRINGE | Freq: Once | SUBCUTANEOUS | Status: AC
  Administered 2021-10-21: 6 mg via SUBCUTANEOUS
  Filled 2021-10-21: qty 0.6

## 2021-10-21 NOTE — Telephone Encounter (Signed)
Patient called and requested to cancel appointment with social work on 8/8. She stated she is feeling very ill from chemo and would like to reschedule.   Thanks!

## 2021-10-22 ENCOUNTER — Inpatient Hospital Stay: Payer: Medicare PPO

## 2021-10-22 ENCOUNTER — Inpatient Hospital Stay: Payer: Medicare PPO | Admitting: Licensed Clinical Social Worker

## 2021-10-22 ENCOUNTER — Other Ambulatory Visit: Payer: Self-pay

## 2021-10-22 MED ORDER — TRAMADOL HCL 50 MG PO TABS: 100.0000 mg | ORAL_TABLET | Freq: Four times a day (QID) | ORAL | 0 refills | Status: AC | PRN

## 2021-10-22 MED ORDER — LORAZEPAM 0.5 MG PO TABS: 0.5000 mg | ORAL_TABLET | Freq: Three times a day (TID) | ORAL | 0 refills | Status: DC | PRN

## 2021-10-28 ENCOUNTER — Inpatient Hospital Stay: Payer: Medicare PPO

## 2021-10-28 ENCOUNTER — Telehealth: Payer: Self-pay

## 2021-10-28 DIAGNOSIS — R160 Hepatomegaly, not elsewhere classified: Secondary | ICD-10-CM

## 2021-10-28 DIAGNOSIS — Z5112 Encounter for antineoplastic immunotherapy: Secondary | ICD-10-CM | POA: Diagnosis not present

## 2021-10-28 LAB — CBC WITH DIFFERENTIAL/PLATELET
Abs Immature Granulocytes: 0.61 10*3/uL — ABNORMAL HIGH (ref 0.00–0.07)
Basophils Absolute: 0 10*3/uL (ref 0.0–0.1)
Basophils Relative: 0 %
Eosinophils Absolute: 0.1 10*3/uL (ref 0.0–0.5)
Eosinophils Relative: 1 %
HCT: 28.8 % — ABNORMAL LOW (ref 36.0–46.0)
Hemoglobin: 8.9 g/dL — ABNORMAL LOW (ref 12.0–15.0)
Immature Granulocytes: 8 %
Lymphocytes Relative: 8 %
Lymphs Abs: 0.6 10*3/uL — ABNORMAL LOW (ref 0.7–4.0)
MCH: 30.7 pg (ref 26.0–34.0)
MCHC: 30.9 g/dL (ref 30.0–36.0)
MCV: 99.3 fL (ref 80.0–100.0)
Monocytes Absolute: 0.9 10*3/uL (ref 0.1–1.0)
Monocytes Relative: 11 %
Neutro Abs: 5.6 10*3/uL (ref 1.7–7.7)
Neutrophils Relative %: 72 %
Platelets: 136 10*3/uL — ABNORMAL LOW (ref 150–400)
RBC: 2.9 MIL/uL — ABNORMAL LOW (ref 3.87–5.11)
RDW: 20.1 % — ABNORMAL HIGH (ref 11.5–15.5)
Smear Review: NORMAL
WBC: 7.8 10*3/uL (ref 4.0–10.5)
nRBC: 0.5 % — ABNORMAL HIGH (ref 0.0–0.2)

## 2021-10-28 LAB — SAMPLE TO BLOOD BANK

## 2021-10-28 NOTE — Telephone Encounter (Signed)
Patient's hemoglobin is 8.9. Pt informed that blood transfusion is not needed. Appt has been cancelled

## 2021-10-29 ENCOUNTER — Inpatient Hospital Stay: Payer: Medicare PPO

## 2021-10-31 ENCOUNTER — Ambulatory Visit: Payer: Medicare PPO | Admitting: Radiation Oncology

## 2021-11-07 MED FILL — Fosaprepitant Dimeglumine For IV Infusion 150 MG (Base Eq): INTRAVENOUS | Qty: 5 | Status: AC

## 2021-11-07 MED FILL — Dexamethasone Sodium Phosphate Inj 100 MG/10ML: INTRAMUSCULAR | Qty: 1 | Status: AC

## 2021-11-08 ENCOUNTER — Inpatient Hospital Stay (HOSPITAL_BASED_OUTPATIENT_CLINIC_OR_DEPARTMENT_OTHER): Payer: Medicare PPO | Admitting: Oncology

## 2021-11-08 ENCOUNTER — Inpatient Hospital Stay: Payer: Medicare PPO

## 2021-11-08 ENCOUNTER — Encounter: Payer: Self-pay | Admitting: Oncology

## 2021-11-08 VITALS — BP 166/77 | HR 92 | Temp 97.5°F | Resp 19 | Wt 279.6 lb

## 2021-11-08 VITALS — BP 145/79 | HR 86

## 2021-11-08 DIAGNOSIS — C541 Malignant neoplasm of endometrium: Secondary | ICD-10-CM | POA: Diagnosis not present

## 2021-11-08 DIAGNOSIS — E279 Disorder of adrenal gland, unspecified: Secondary | ICD-10-CM | POA: Diagnosis not present

## 2021-11-08 DIAGNOSIS — D6481 Anemia due to antineoplastic chemotherapy: Secondary | ICD-10-CM | POA: Diagnosis not present

## 2021-11-08 DIAGNOSIS — Z5112 Encounter for antineoplastic immunotherapy: Secondary | ICD-10-CM | POA: Diagnosis not present

## 2021-11-08 DIAGNOSIS — E278 Other specified disorders of adrenal gland: Secondary | ICD-10-CM | POA: Diagnosis not present

## 2021-11-08 DIAGNOSIS — Z5111 Encounter for antineoplastic chemotherapy: Secondary | ICD-10-CM | POA: Diagnosis not present

## 2021-11-08 DIAGNOSIS — Z6841 Body Mass Index (BMI) 40.0 and over, adult: Secondary | ICD-10-CM

## 2021-11-08 DIAGNOSIS — T451X5A Adverse effect of antineoplastic and immunosuppressive drugs, initial encounter: Secondary | ICD-10-CM

## 2021-11-08 LAB — COMPREHENSIVE METABOLIC PANEL
ALT: 13 U/L (ref 0–44)
AST: 20 U/L (ref 15–41)
Albumin: 3.4 g/dL — ABNORMAL LOW (ref 3.5–5.0)
Alkaline Phosphatase: 58 U/L (ref 38–126)
Anion gap: 8 (ref 5–15)
BUN: 20 mg/dL (ref 8–23)
CO2: 24 mmol/L (ref 22–32)
Calcium: 9.5 mg/dL (ref 8.9–10.3)
Chloride: 106 mmol/L (ref 98–111)
Creatinine, Ser: 0.77 mg/dL (ref 0.44–1.00)
GFR, Estimated: >60 mL/min (ref >60)
Glucose, Bld: 158 mg/dL — ABNORMAL HIGH (ref 70–99)
Potassium: 3.9 mmol/L (ref 3.5–5.1)
Sodium: 138 mmol/L (ref 135–145)
Total Bilirubin: 0.3 mg/dL (ref 0.3–1.2)
Total Protein: 7 g/dL (ref 6.5–8.1)

## 2021-11-08 LAB — CBC WITH DIFFERENTIAL/PLATELET
Abs Immature Granulocytes: 0.03 10*3/uL (ref 0.00–0.07)
Basophils Absolute: 0 10*3/uL (ref 0.0–0.1)
Basophils Relative: 1 %
Eosinophils Absolute: 0.2 10*3/uL (ref 0.0–0.5)
Eosinophils Relative: 3 %
HCT: 27.7 % — ABNORMAL LOW (ref 36.0–46.0)
Hemoglobin: 9 g/dL — ABNORMAL LOW (ref 12.0–15.0)
Immature Granulocytes: 1 %
Lymphocytes Relative: 12 %
Lymphs Abs: 0.5 10*3/uL — ABNORMAL LOW (ref 0.7–4.0)
MCH: 32.7 pg (ref 26.0–34.0)
MCHC: 32.5 g/dL (ref 30.0–36.0)
MCV: 100.7 fL — ABNORMAL HIGH (ref 80.0–100.0)
Monocytes Absolute: 0.5 10*3/uL (ref 0.1–1.0)
Monocytes Relative: 12 %
Neutro Abs: 3.3 10*3/uL (ref 1.7–7.7)
Neutrophils Relative %: 71 %
Platelets: 130 10*3/uL — ABNORMAL LOW (ref 150–400)
RBC: 2.75 MIL/uL — ABNORMAL LOW (ref 3.87–5.11)
RDW: 19.8 % — ABNORMAL HIGH (ref 11.5–15.5)
WBC: 4.5 10*3/uL (ref 4.0–10.5)
nRBC: 0 % (ref 0.0–0.2)

## 2021-11-08 MED ORDER — PALONOSETRON HCL INJECTION 0.25 MG/5ML
0.2500 mg | Freq: Once | INTRAVENOUS | Status: AC
  Administered 2021-11-08: 0.25 mg via INTRAVENOUS
  Filled 2021-11-08: qty 5

## 2021-11-08 MED ORDER — SODIUM CHLORIDE 0.9 % IV SOLN
200.0000 mg | Freq: Once | INTRAVENOUS | Status: AC
  Administered 2021-11-08: 200 mg via INTRAVENOUS
  Filled 2021-11-08: qty 8

## 2021-11-08 MED ORDER — FAMOTIDINE IN NACL 20-0.9 MG/50ML-% IV SOLN
20.0000 mg | Freq: Once | INTRAVENOUS | Status: AC
  Administered 2021-11-08: 20 mg via INTRAVENOUS
  Filled 2021-11-08: qty 50

## 2021-11-08 MED ORDER — DIPHENHYDRAMINE HCL 50 MG/ML IJ SOLN
50.0000 mg | Freq: Once | INTRAMUSCULAR | Status: AC
  Administered 2021-11-08: 50 mg via INTRAVENOUS
  Filled 2021-11-08: qty 1

## 2021-11-08 MED ORDER — HEPARIN SOD (PORK) LOCK FLUSH 100 UNIT/ML IV SOLN
500.0000 [IU] | Freq: Once | INTRAVENOUS | Status: AC | PRN
  Filled 2021-11-08: qty 5

## 2021-11-08 MED ORDER — SODIUM CHLORIDE 0.9 % IV SOLN
652.0000 mg | Freq: Once | INTRAVENOUS | Status: AC
  Administered 2021-11-08: 650 mg via INTRAVENOUS
  Filled 2021-11-08: qty 65

## 2021-11-08 MED ORDER — SODIUM CHLORIDE 0.9 % IV SOLN
Freq: Once | INTRAVENOUS | Status: AC
  Filled 2021-11-08: qty 250

## 2021-11-08 MED ORDER — SODIUM CHLORIDE 0.9 % IV SOLN
150.0000 mg/m2 | Freq: Once | INTRAVENOUS | Status: AC
  Administered 2021-11-08: 372 mg via INTRAVENOUS
  Filled 2021-11-08: qty 62

## 2021-11-08 MED ORDER — SODIUM CHLORIDE 0.9 % IV SOLN
150.0000 mg | Freq: Once | INTRAVENOUS | Status: AC
  Administered 2021-11-08: 150 mg via INTRAVENOUS
  Filled 2021-11-08: qty 150

## 2021-11-08 MED ORDER — SODIUM CHLORIDE 0.9 % IV SOLN
10.0000 mg | Freq: Once | INTRAVENOUS | Status: AC
  Administered 2021-11-08: 10 mg via INTRAVENOUS
  Filled 2021-11-08: qty 10

## 2021-11-08 MED ORDER — HEPARIN SOD (PORK) LOCK FLUSH 100 UNIT/ML IV SOLN
INTRAVENOUS | Status: AC
  Administered 2021-11-08: 500 [IU]
  Filled 2021-11-08: qty 5

## 2021-11-08 NOTE — Patient Instructions (Signed)
[facility] AT [place]  Discharge Instructions: Thank you for choosing [facility] to provide your oncology and hematology care.  If you have a lab appointment with the Cancer Center, please go directly to the Cancer Center and check in at the registration area.  Wear comfortable clothing and clothing appropriate for easy access to any Portacath or PICC line.   We strive to give you quality time with your provider. You may need to reschedule your appointment if you arrive late (15 or more minutes).  Arriving late affects you and other patients whose appointments are after yours.  Also, if you miss three or more appointments without notifying the office, you may be dismissed from the clinic at the provider's discretion.      For prescription refill requests, have your pharmacy contact our office and allow 72 hours for refills to be completed.    Today you received the following chemotherapy and/or immunotherapy agents- Keytruda, Taxol, Carboplatin      To help prevent nausea and vomiting after your treatment, we encourage you to take your nausea medication as directed.  BELOW ARE SYMPTOMS THAT SHOULD BE REPORTED IMMEDIATELY: *FEVER GREATER THAN 100.4 F (38 C) OR HIGHER *CHILLS OR SWEATING *NAUSEA AND VOMITING THAT IS NOT CONTROLLED WITH YOUR NAUSEA MEDICATION *UNUSUAL SHORTNESS OF BREATH *UNUSUAL BRUISING OR BLEEDING *URINARY PROBLEMS (pain or burning when urinating, or frequent urination) *BOWEL PROBLEMS (unusual diarrhea, constipation, pain near the anus) TENDERNESS IN MOUTH AND THROAT WITH OR WITHOUT PRESENCE OF ULCERS (sore throat, sores in mouth, or a toothache) UNUSUAL RASH, SWELLING OR PAIN  UNUSUAL VAGINAL DISCHARGE OR ITCHING   Items with * indicate a potential emergency and should be followed up as soon as possible or go to the Emergency Department if any problems should occur.  Please show the CHEMOTHERAPY ALERT CARD or IMMUNOTHERAPY ALERT  CARD at check-in to the Emergency Department and triage nurse.  Should you have questions after your visit or need to cancel or reschedule your appointment, please contact [facility] AT [place]  [phone] and follow the prompts.  Office hours are 8:00 a.m. to 4:30 p.m. Monday - Friday. Please note that voicemails left after 4:00 p.m. may not be returned until the following business day.  We are closed weekends and major holidays. You have access to a nurse at all times for urgent questions. Please call the main number to the clinic [phone] and follow the prompts.  For any non-urgent questions, you may also contact your provider using MyChart. We now offer e-Visits for anyone 18 and older to request care online for non-urgent symptoms. For details visit [email or web address].   Also download the MyChart app! Go to the app store, search "MyChart", open the app, select [place], and log in with your MyChart username and password.  Masks are optional in the cancer centers. If you would like for your care team to wear a mask while they are taking care of you, please let them know. For doctor visits, patients may have with them one support person who is at least 70 years old. At this time, visitors are not allowed in the infusion area.   

## 2021-11-08 NOTE — Assessment & Plan Note (Signed)
s/p carboplatin and Taxol for 3 cycles, PET scan showed complete response.  Imaging was reviewed and discussed with patient.  No residual hypermetabolism.  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  -plan to finish cycle 4 and 5 Carbo/Taxol, adding Keytruda, followed by [facility] maintenance.  Labs are reviewed and discussed with patient. Proceed with cycle 5 carboplatin and Taxol and Keytruda with D4 Udenyca.

## 2021-11-09 ENCOUNTER — Encounter: Payer: Self-pay | Admitting: Oncology

## 2021-11-09 NOTE — Assessment & Plan Note (Signed)
discussed about lifestyle modification, healthy diet and exercise.  Follow up with nutritionist

## 2021-11-09 NOTE — Progress Notes (Unsigned)
Hematology/Oncology Progress note [phone] [phone]         Patient Care Team: Tracie Harrier, MD as PCP - General (Internal Medicine) Cammie Sickle, MD as Consulting Physician (Oncology)   CHIEF COMPLAINTS/REASON FOR VISIT:  anemia and endometrial cancer   ASSESSMENT & PLAN:   Cancer Staging  Endometrial adenocarcinoma [facility]) Staging form: Corpus Uteri - Carcinoma and Carcinosarcoma, AJCC 8th Edition - Clinical stage from 04/10/2021: FIGO Stage III, calculated as Stage Unknown (cT3, cNX, cM0) - Signed by Earlie Server, MD on 08/24/2021   Endometrial adenocarcinoma ([place]) s/p carboplatin and Taxol for 3 cycles, PET scan showed complete response.  Imaging was reviewed and discussed with patient.  No residual hypermetabolism.  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  -plan to finish cycle 4 and 5 Carbo/Taxol, adding Keytruda, followed by [facility] maintenance.  Labs are reviewed and discussed with patient. Proceed with cycle 5 carboplatin and Taxol and Keytruda with D4 Udenyca.     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  FDG avid on PET scan.  Difficult biopsy due to patient's body habitus and deep position of the mass. Resolved on current PET scan, suggesting that this lesion might be a metastatic lesion.  Anemia due to antineoplastic chemotherapy Close monitor patient's counts.  Repeat CBC in 10 days, plan for possible PRBC transfusion  Encounter for antineoplastic chemotherapy Treatment as planned above  Morbid obesity with body mass index (BMI) of 40.0 to 44.9 in adult [facility]) discussed about lifestyle modification, healthy diet and exercise.  Follow up with nutritionist   Orders Placed This Encounter  Procedures   CBC with Differential/Platelet    Standing Status:   Future    Standing Expiration Date:   11/09/2022   Sample to Blood Bank    Standing  Status:   Future    Standing Expiration Date:   11/09/2022    Return of visit:  10 days lab possible PRBC transfusion 3 weeks Lab MD carboplatin Taxol Keytruda- D4 Udenyca.    Earlie Server, MD, [name] [facility] Hematology Oncology 11/08/2021     HISTORY OF PRESENTING ILLNESS:   Valerie Case is a  71 y.o.  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 Oncology History  Endometrial adenocarcinoma ([place])  12/12/2020 Imaging   12/12/2020, CT abdomen pelvis with contrast showed no radiographic evidence of urinary tract neoplasm, calculi, hydronephrosis.  2.6 cm nonspecific left adrenal mass. 02/12/2021 CT hematuria work-up showed small uterine fibroids, no findings to explain hematuria.Hepatomegaly, diverticulosis without evidence of diverticulitis, Coronary artery disease   04/01/2021 -  Hospital Admission   04/01/2021 - 04/03/2021, patient was hospitalized due to symptomatic anemia, hemoglobin 6.9, heavy postmenopausal bleeding with passing large clots.  She also presented with increased creatinine level to 2.58 compared to her baseline level of 0.9 in November 2022.  Patient received IV iron infusion and 2 units of PRBC during the hospital stay..  04/02/2021, iron panel showed iron saturation 37, ferritin 37, TIBC 333-the studies were done after patient received blood transfusion.  At discharge, hemoglobin was 7.7.   04/01/2021 Initial Diagnosis   04/01/2020, endometrial biopsy showed poorly differentiated endometrial adenocarcinoma. Omniseq NGS showed TMB 48.4 mt/mb [high], MSI High, PD-L1-TPS <1%, PIK3CA O8325Q, Negative for BRAF, HER2, NTRK1 RET.     04/10/2021 Cancer Staging   Staging form: Corpus Uteri - Carcinoma and Carcinosarcoma, AJCC 8th Edition - Clinical stage from  04/10/2021: FIGO Stage III, calculated as Stage Unknown (cT3, cNX, cM0) - Signed by Earlie Server, MD on 08/24/2021 Stage prefix: Initial diagnosis   04/15/2021 Imaging   PET  showed 1. Large hypermetabolic endometrial mass consistent with known endometrial cancer. Suspect direct invasion/involvement of the right adnexa. Right pelvic sidewall hypermetabolic adenopathy.2. 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 3. No findings for metastatic disease involving the chest or bonystructures.     04/24/2021 - 05/29/2021 Radiation Therapy   status post radiation to pelvis   08/05/2021 Imaging   MRI abdomen w wo contrast 1. Stable solid enhancing 2.7 cm left adrenal lesion, which was hypermetabolic on prior PET/CTs but is unchanged in size dating back to December 12, 2020. 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 However, unfortunately metastatic disease can not be entirely excluded and remains a pertinent differential consideration. Comparison with more remote prior imaging would be the most valuable tool in the assessment of this lesion, as demonstrating long-term stability would indicate this to be a benign lesion. However, if no prior imaging can be made available, would consider follow-up adrenal protocol CT with and without intravenous contrast material in 3 months as this would allow for further assessment of stability as well as enhancement and washout characteristics of the lesion potentially allowing for better characterization versus direct tissue sampling.2. Hepatomegaly and hepatic steatosis.3. Colonic diverticulosis without findings of acute diverticulitis    08/16/2021 - 09/27/2021 Chemotherapy   UTERINE Carboplatin AUC 5 / Paclitaxel q21d x 3      10/03/2021 Imaging   PET 1. No residual hypermetabolism in the uterus suggesting an excellent response to treatment. No findings for metastatic disease. 2.  Resolution of hypermetabolism in the left adrenal gland lesion suggesting this was metastatic disease. 3. Diffuse marrow hypermetabolism likely due to rebound from chemotherapy or marrow stimulating drugs   10/18/2021 - 11/08/2021 Chemotherapy   AUC 5 / Paclitaxel/ Keytruda Q21d  x 2 cycles       INTERVAL HISTORY LUNDEN STIEBER is a 71 y.o. female who has above history reviewed by me today presents for follow up visit for anemia and endometrial cancer Overall she tolerates treatment, with cytopenia.  + Fatigue. No nausea vomiting diarrhea.   Review of Systems  Constitutional:  Positive for fatigue. Negative for chills and fever.  HENT:   Negative for hearing loss and voice change.   Eyes:  Negative for eye problems.  Respiratory:  Negative for chest tightness and cough.   Cardiovascular:  Negative for chest pain.  Gastrointestinal:  Negative for abdominal distention, abdominal pain and blood in stool.  Endocrine: Negative for hot flashes.  Genitourinary:  Negative for difficulty urinating, frequency and vaginal bleeding.   Musculoskeletal:  Positive for arthralgias.  Skin:  Negative for itching and rash.  Neurological:  Negative for extremity weakness.  Hematological:  Negative for adenopathy.  Psychiatric/Behavioral:  Negative for confusion.     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    SURGICAL  HISTORY: Past Surgical History:  Procedure Laterality Date   CESAREAN SECTION  01/23/1979   IR IMAGING GUIDED PORT INSERTION  08/09/2021   TONSILLECTOMY  03/17/1956    SOCIAL HISTORY: Social History   Socioeconomic History   Marital status: Divorced    Spouse name: Not on file   Number of children:  Not on file   Years of education: Not on file   Highest education level: Not on file  Occupational History   Not on file  Tobacco Use   Smoking status: Never   Smokeless tobacco: Never  Vaping Use   Vaping Use: Never used  Substance and Sexual Activity   Alcohol use: Not Currently   Drug use: Never   Sexual activity: Not Currently  Other Topics Concern   Not on file  Social History Narrative      Social Determinants of Health   Financial Resource Strain: Low Risk  (09/27/2021)   Overall Financial Resource Strain (CARDIA)    Difficulty of Paying Living Expenses: Not very hard  Food Insecurity: No Food Insecurity (09/27/2021)   Hunger Vital Sign    Worried About Running Out of Food in the Last Year: Never true    Ran Out of Food in the Last Year: Never true  Transportation Needs: No Transportation Needs (09/27/2021)   PRAPARE - Hydrologist (Medical): No    Lack of Transportation (Non-Medical): No  Physical Activity: Inactive (09/27/2021)   Exercise Vital Sign    Days of Exercise per Week: 0 days    Minutes of Exercise per Session: 0 min  Stress: Stress Concern Present (09/27/2021)   [place]    Feeling of Stress : Rather much  Social Connections: Moderately Integrated (09/27/2021)   Social Connection and Isolation Panel [NHANES]    Frequency of Communication with Friends and Family: Three times a week    Frequency of Social Gatherings with Friends and Family: Three times a week    Attends Religious Services: 1 to 4 times per year    Active Member of Clubs or Organizations: No    Attends Archivist Meetings: 1 to 4 times per year    Marital Status: Divorced  Intimate Partner Violence: Not At Risk (09/27/2021)   Humiliation, Afraid, Rape, and Kick questionnaire    Fear of Current or Ex-Partner: No    Emotionally Abused: No    Physically Abused: No    Sexually Abused: No     FAMILY HISTORY: Family History  Problem Relation Age of Onset   Breast cancer Mother 90   Cancer Mother    Cancer Father    Lung cancer Father    Breast cancer Cousin        maternal side    ALLERGIES:  is allergic to aspirin.  MEDICATIONS:  Current Outpatient Medications  Medication Sig Dispense Refill   acetaminophen (TYLENOL) 650 MG CR tablet Take 1,300 mg by mouth every 8 (eight) hours as needed for pain.     albuterol (VENTOLIN HFA) 108 (90 Base) MCG/ACT inhaler Inhale 2 puffs into the lungs every 6 (six) hours as needed for wheezing or shortness of breath.     Cranberry-Vitamin C-Probiotic (AZO CRANBERRY PO) Take by mouth.     docusate sodium (COLACE) 100 MG capsule Take 1 capsule (100 mg total) by mouth 2 (two) times daily. 60 capsule 1   empagliflozin (JARDIANCE) 25 MG TABS tablet Take 25 mg by  mouth daily.     ferrous sulfate 325 (65 FE) MG EC tablet Take 325 mg by mouth daily with breakfast.     FLUoxetine (PROZAC) 10 MG capsule Take 10 mg by mouth daily.     fluticasone (FLONASE) 50 MCG/ACT nasal spray Place 1 spray into both nostrils daily as needed for allergies or rhinitis.     hydrochlorothiazide (HYDRODIURIL) 12.5 MG tablet Take 12.5 mg by mouth daily.     hydrocortisone 2.5 % cream Apply topically 2 (two) times daily. 30 g 2   ibuprofen (ADVIL) 200 MG tablet Take 400 mg by mouth every 8 (eight) hours as needed for moderate pain.     lidocaine-prilocaine (EMLA) cream Apply to affected area once 30 g 3   lisinopril (ZESTRIL) 40 MG tablet Take 40 mg by mouth at bedtime.     LORazepam (ATIVAN) 0.5 MG tablet Take 1 tablet (0.5 mg total) by mouth every 8 (eight) hours as needed for anxiety (nausea vomiting). 30 tablet 0   metFORMIN (GLUCOPHAGE-XR) 500 MG 24 hr tablet Take 1,000 mg by mouth at bedtime.     metoprolol succinate (TOPROL-XL) 25 MG 24 hr tablet Take 25 mg by mouth daily.     NOVOLIN 70/30 FLEXPEN (70-30) 100 UNIT/ML KwikPen 50-110 Units See admin  instructions. 50 units in the morning, 110 units at bedtime     ondansetron (ZOFRAN) 8 MG tablet Take 1 tablet (8 mg total) by mouth 2 (two) times daily as needed. Start on the third day after chemotherapy. 30 tablet 1   OZEMPIC, 1 MG/DOSE, 4 MG/3ML SOPN Inject 1 mg into the skin every Tuesday.     prochlorperazine (COMPAZINE) 10 MG tablet Take 1 tablet (10 mg total) by mouth every 6 (six) hours as needed (Nausea or vomiting). 30 tablet 1   rosuvastatin (CRESTOR) 10 MG tablet Take 10 mg by mouth daily.     traMADol (ULTRAM) 50 MG tablet Take 2 tablets (100 mg total) by mouth every 6 (six) hours as needed. 90 tablet 0   No current facility-administered medications for this visit.     PHYSICAL EXAMINATION: ECOG PERFORMANCE STATUS: 1 - Symptomatic but completely ambulatory  Physical Exam Constitutional:      General: She is not in acute distress.    Appearance: She is obese.  HENT:     Head: Normocephalic and atraumatic.  Eyes:     General: No scleral icterus. Cardiovascular:     Rate and Rhythm: Normal rate and regular rhythm.     Heart sounds: Normal heart sounds.  Pulmonary:     Effort: Pulmonary effort is normal. No respiratory distress.     Breath sounds: No wheezing.  Abdominal:     General: Bowel sounds are normal. There is no distension.     Palpations: Abdomen is soft.  Musculoskeletal:        General: No deformity. Normal range of motion.     Cervical back: Normal range of motion and neck supple.     Right lower leg: Edema present.     Left lower leg: Edema present.     Comments:    Skin:    General: Skin is warm and dry.     Findings: No erythema or rash.     Comments: Chronic venous sufficiency skin changes  Neurological:     Mental Status: She is alert and oriented to person, place, and time. Mental status is at baseline.     Cranial Nerves: No cranial nerve deficit.  Coordination: Coordination normal.  Psychiatric:        Mood and Affect: Mood normal.       LABORATORY DATA:  I have reviewed the data as listed Lab Results  Component Value Date   WBC 4.5 11/08/2021   HGB 9.0 (L) 11/08/2021   HCT 27.7 (L) 11/08/2021   MCV 100.7 (H) 11/08/2021   PLT 130 (L) 11/08/2021   Recent Labs    04/11/21 1553 05/17/21 1024 09/27/21 0834 10/18/21 0840 11/08/21 0757  NA  --    < > 139 141 138  K  --    < > 4.2 3.7 3.9  CL  --    < > 104 104 106  CO2  --    < > 24 26 24   GLUCOSE  --    < > 212* 140* 158*  BUN  --    < > 25* 19 20  CREATININE  --    < > 0.81 0.81 0.77  CALCIUM  --    < > 9.8 10.4* 9.5  GFRNONAA  --    < > >60 >60 >60  PROT  --    < > 7.2 7.2 7.0  ALBUMIN  --    < > 3.7 3.7 3.4*  AST  --    < > 20 22 20   ALT  --    < > 13 15 13   ALKPHOS  --    < > 63 66 58  BILITOT 0.3   < > 0.3 0.2* 0.3  BILIDIR <0.1  --   --   --   --   IBILI NOT CALCULATED  --   --   --   --    < > = values in this interval not displayed.    Iron/TIBC/Ferritin/ %Sat    Component Value Date/Time   IRON 53 07/31/2021 1511   IRON 84 06/08/2012 1128   TIBC 357 07/31/2021 1511   TIBC 403 06/08/2012 1128   FERRITIN 44 07/31/2021 1511   FERRITIN 68 06/08/2012 1128   IRONPCTSAT 15 07/31/2021 1511   IRONPCTSAT 21 06/08/2012 1128      RADIOGRAPHIC STUDIES: I have personally reviewed the radiological images as listed and agreed with the findings in the report. NM PET Image Restag (PS) Skull Base To Thigh  Result Date: 10/04/2021 CLINICAL DATA:  Subsequent treatment strategy for endometrial carcinoma. EXAM: NUCLEAR MEDICINE PET SKULL BASE TO THIGH TECHNIQUE: 14.57 mCi F-18 FDG was injected intravenously. Full-ring PET imaging was performed from the skull base to thigh after the radiotracer. CT data was obtained and used for attenuation correction and anatomic localization. Fasting blood glucose: 151 mg/dl COMPARISON:  Prior PET CTs from 04/15/2021 and 06/10/2021. Recent MRI abdomen 08/05/2021 FINDINGS: Mediastinal blood pool activity: SUV max 1.32  Liver activity: SUV max NA NECK: No hypermetabolic lymph nodes in the neck. Incidental CT findings: Right IJ catheter in good position. CHEST: No hypermetabolic mediastinal or hilar nodes. No suspicious pulmonary nodules on the CT scan. No hypermetabolic breast masses, supraclavicular or axillary adenopathy. Incidental CT findings: Stable cardiac enlargement. Stable atherosclerotic calcifications involving the aorta and coronary arteries. ABDOMEN/PELVIS: I do not see any residual hypermetabolism involving the uterus suggesting an excellent response to treatment. No enlarged or hypermetabolic pelvic lymph nodes. No evidence of omental or peritoneal surface disease. No evidence of metastatic disease involving the liver or adrenal glands. There is a stable 2.5 cm left adrenal gland lesion. This was hypermetabolic on the prior PET-CT with  SUV max of 6.87 but this has completely resolved suggesting it is treated metastatic disease. Incidental CT findings: Stable large left-sided abdominal wall hernia. Stable descending and sigmoid colon diverticulosis. SKELETON: Diffuse hypermetabolic marrow suggesting rebound from chemotherapy or marrow stimulating drugs. Incidental CT findings: none IMPRESSION: 1. No residual hypermetabolism in the uterus suggesting an excellent response to treatment. No findings for metastatic disease. 2. Resolution of hypermetabolism in the left adrenal gland lesion suggesting this was metastatic disease. 3. Diffuse marrow hypermetabolism likely due to rebound from chemotherapy or marrow stimulating drugs. Electronically Signed   By: Marijo Sanes M.D.   On: 10/04/2021 11:08   MM DIAG BREAST TOMO BILATERAL  Result Date: 09/04/2021 CLINICAL DATA:  Nonfocal LEFT breast pain. Currently under treatment for uterine cancer. EXAM: DIGITAL DIAGNOSTIC BILATERAL MAMMOGRAM WITH TOMOSYNTHESIS AND CAD TECHNIQUE: Bilateral digital diagnostic mammography and breast tomosynthesis was performed. The images were  evaluated with computer-aided detection. COMPARISON:  Previous exam(s). ACR Breast Density Category a: The breast tissue is almost entirely fatty. FINDINGS: Diagnostic mammographic images were obtained over the area of pain in the LEFT breast. No suspicious mammographic finding is identified in this area. No suspicious mass, microcalcification, or other finding is identified in either breast. No suspicious mammographic etiology for breast pain identified. IMPRESSION: No mammographic evidence of malignancy bilaterally. No suspicious mammographic etiology for nonfocal LEFT breast pain is identified. Any further workup of the patient's symptoms should be based on the clinical assessment. Recommend routine annual screening mammogram in 1 year. RECOMMENDATION: Screening mammogram in one year.(Code:SM-B-01Y) I have discussed the findings and recommendations with the patient. If applicable, a reminder letter will be sent to the patient regarding the next appointment. BI-RADS CATEGORY  1: Negative. Electronically Signed   By: Valentino Saxon M.D.   On: 09/04/2021 11:13      cc Tracie Harrier, MD

## 2021-11-09 NOTE — Assessment & Plan Note (Signed)
Close monitor patient's counts.  Repeat CBC in 10 days, plan for possible PRBC transfusion

## 2021-11-09 NOTE — Assessment & Plan Note (Signed)
Treatment as planned above. 

## 2021-11-09 NOTE — Assessment & Plan Note (Signed)
lipid poor hyperfunctioning adenoma vs mets.  FDG avid on PET scan.  Difficult biopsy due to patient's body habitus and deep position of the mass. Resolved on current PET scan, suggesting that this lesion might be a metastatic lesion.

## 2021-11-11 ENCOUNTER — Inpatient Hospital Stay: Payer: Medicare PPO

## 2021-11-11 ENCOUNTER — Encounter: Payer: Self-pay | Admitting: Oncology

## 2021-11-11 DIAGNOSIS — Z5112 Encounter for antineoplastic immunotherapy: Secondary | ICD-10-CM | POA: Diagnosis not present

## 2021-11-11 DIAGNOSIS — C541 Malignant neoplasm of endometrium: Secondary | ICD-10-CM

## 2021-11-11 MED ORDER — PEGFILGRASTIM-CBQV 6 MG/0.6ML ~~LOC~~ SOSY
6.0000 mg | PREFILLED_SYRINGE | Freq: Once | SUBCUTANEOUS | Status: AC
  Administered 2021-11-11: 6 mg via SUBCUTANEOUS
  Filled 2021-11-11: qty 0.6

## 2021-11-11 NOTE — Progress Notes (Signed)
DISCONTINUE ON PATHWAY REGIMEN - Uterine     A cycle is every 21 days:     Paclitaxel      Carboplatin   **Always confirm dose/schedule in your pharmacy ordering system**  REASON: Other Reason PRIOR TREATMENT: UTOS50: Carboplatin AUC=6 + Paclitaxel 175 mg/m2 q21 Days x 6 Cycles TREATMENT RESPONSE: Complete Response (CR)  START OFF PATHWAY REGIMEN - Uterine   [id number]:Pembrolizumab 200 mg IV D1 q21 Days:   A cycle is every 21 days:     Pembrolizumab   **Always confirm dose/schedule in your pharmacy ordering system**  Patient Characteristics: Endometrioid, Newly Diagnosed (Clinical Staging), Nonsurgical Candidate, Stage III-IV, MSS/pMMR Histology: Endometrioid Therapeutic Status: Newly Diagnosed (Clinical Staging) AJCC M Category: cM0 AJCC 8 Stage Grouping: IIIC1 AJCC T Category: cT3 AJCC N Category: cN1 Microsatellite/Mismatch Repair Status: MSS/pMMR  Intent of Therapy: Curative Intent, Discussed with Patient

## 2021-11-19 ENCOUNTER — Inpatient Hospital Stay: Payer: Medicare PPO | Attending: Obstetrics and Gynecology

## 2021-11-19 DIAGNOSIS — Z79899 Other long term (current) drug therapy: Secondary | ICD-10-CM | POA: Diagnosis not present

## 2021-11-19 DIAGNOSIS — C541 Malignant neoplasm of endometrium: Secondary | ICD-10-CM | POA: Insufficient documentation

## 2021-11-19 DIAGNOSIS — I700 Atherosclerosis of aorta: Secondary | ICD-10-CM | POA: Diagnosis not present

## 2021-11-19 DIAGNOSIS — K760 Fatty (change of) liver, not elsewhere classified: Secondary | ICD-10-CM | POA: Diagnosis not present

## 2021-11-19 DIAGNOSIS — E278 Other specified disorders of adrenal gland: Secondary | ICD-10-CM | POA: Diagnosis not present

## 2021-11-19 DIAGNOSIS — T451X5A Adverse effect of antineoplastic and immunosuppressive drugs, initial encounter: Secondary | ICD-10-CM | POA: Insufficient documentation

## 2021-11-19 DIAGNOSIS — E119 Type 2 diabetes mellitus without complications: Secondary | ICD-10-CM | POA: Diagnosis not present

## 2021-11-19 DIAGNOSIS — N644 Mastodynia: Secondary | ICD-10-CM | POA: Diagnosis not present

## 2021-11-19 DIAGNOSIS — Z5111 Encounter for antineoplastic chemotherapy: Secondary | ICD-10-CM | POA: Diagnosis present

## 2021-11-19 DIAGNOSIS — Z5112 Encounter for antineoplastic immunotherapy: Secondary | ICD-10-CM | POA: Diagnosis present

## 2021-11-19 DIAGNOSIS — Z886 Allergy status to analgesic agent status: Secondary | ICD-10-CM | POA: Insufficient documentation

## 2021-11-19 DIAGNOSIS — R5383 Other fatigue: Secondary | ICD-10-CM | POA: Insufficient documentation

## 2021-11-19 DIAGNOSIS — K5730 Diverticulosis of large intestine without perforation or abscess without bleeding: Secondary | ICD-10-CM | POA: Insufficient documentation

## 2021-11-19 DIAGNOSIS — D6481 Anemia due to antineoplastic chemotherapy: Secondary | ICD-10-CM | POA: Diagnosis not present

## 2021-11-19 DIAGNOSIS — Z923 Personal history of irradiation: Secondary | ICD-10-CM | POA: Insufficient documentation

## 2021-11-19 LAB — CBC WITH DIFFERENTIAL/PLATELET
Abs Immature Granulocytes: 0.26 10*3/uL — ABNORMAL HIGH (ref 0.00–0.07)
Basophils Absolute: 0.1 10*3/uL (ref 0.0–0.1)
Basophils Relative: 1 %
Eosinophils Absolute: 0.1 10*3/uL (ref 0.0–0.5)
Eosinophils Relative: 1 %
HCT: 26.7 % — ABNORMAL LOW (ref 36.0–46.0)
Hemoglobin: 8.6 g/dL — ABNORMAL LOW (ref 12.0–15.0)
Immature Granulocytes: 3 %
Lymphocytes Relative: 9 %
Lymphs Abs: 0.8 10*3/uL (ref 0.7–4.0)
MCH: 32.5 pg (ref 26.0–34.0)
MCHC: 32.2 g/dL (ref 30.0–36.0)
MCV: 100.8 fL — ABNORMAL HIGH (ref 80.0–100.0)
Monocytes Absolute: 1.1 10*3/uL — ABNORMAL HIGH (ref 0.1–1.0)
Monocytes Relative: 12 %
Neutro Abs: 6.6 10*3/uL (ref 1.7–7.7)
Neutrophils Relative %: 74 %
Platelets: 120 10*3/uL — ABNORMAL LOW (ref 150–400)
RBC: 2.65 MIL/uL — ABNORMAL LOW (ref 3.87–5.11)
RDW: 18.9 % — ABNORMAL HIGH (ref 11.5–15.5)
Smear Review: NORMAL
WBC: 8.8 10*3/uL (ref 4.0–10.5)
nRBC: 0.3 % — ABNORMAL HIGH (ref 0.0–0.2)

## 2021-11-19 LAB — SAMPLE TO BLOOD BANK

## 2021-11-21 ENCOUNTER — Ambulatory Visit
Admission: RE | Admit: 2021-11-21 | Discharge: 2021-11-21 | Disposition: A | Discharge status: Home / Self Care | Payer: Medicare PPO | Source: Ambulatory Visit | Attending: Radiation Oncology | Admitting: Radiation Oncology

## 2021-11-21 VITALS — BP 163/73 | HR 110 | Resp 20 | Ht 68.0 in | Wt 280.0 lb

## 2021-11-21 DIAGNOSIS — C541 Malignant neoplasm of endometrium: Secondary | ICD-10-CM

## 2021-11-21 NOTE — Progress Notes (Signed)
Radiation Oncology Follow up Note  Name: Valerie Case   Date:   11/21/2021 MRN:  [id number] DOB: 05-03-1950    This 71 y.o. female presents to the clinic today for 57-monthfollow-up status post radiation therapy to her pelvis for high-grade endometrial carcinoma FIGO grade 3..  REFERRING PROVIDER: HTracie Harrier MD  HPI: Patient is a 71year old female now out 6 months having completed external beam radiation therapy for high-grade endometrial carcinoma FIGO grade 3..  Radiation was given based on bleeding which she tolerated well.  She is seen today in routine follow-up and is doing fairly well.  She had a PET scan back in July showing no residual hypermetabolic activity in the uterus suggesting excellent response to treatment.  No evidence of metastatic disease.  The left adrenal gland lesion also resolved.  She has completed CarboTaxol cycle 4 out of 5 and continue Keytruda maintenance.  Which she is tolerating well.  She had last month pelvic exam by Dr. STheora Gianottiwhich was complicated based on the patient's inability to lay flat but no evidence of disease was noted.  COMPLICATIONS OF TREATMENT: none  FOLLOW UP COMPLIANCE: keeps appointments   PHYSICAL EXAM: Well-developed obese for female in NAD.  Well-developed well-nourished patient in NAD. HEENT reveals PERLA, EOMI, discs not visualized.  Oral cavity is clear. No oral mucosal lesions are identified. Neck is clear without evidence of cervical or supraclavicular adenopathy. Lungs are clear to A&P. Cardiac examination is essentially unremarkable with regular rate and rhythm without murmur rub or thrill. Abdomen is benign with no organomegaly or masses noted. Motor sensory and DTR levels are equal and symmetric in the upper and lower extremities. Cranial nerves II through XII are grossly intact. Proprioception is intact. No peripheral adenopathy or edema is identified. No motor or sensory levels are noted. Crude visual fields are within  normal range.  e There were no vitals taken for this visit.   RADIOLOGY RESULTS: PET scan reviewed compatible with above-stated findings  PLAN: Present time patient can continues on maintenance immunotherapy tolerating that well.  She has a clinical complete response by PET/CT criteria.  On pleased with her overall progress.  I have asked to see her back in 6 months for follow-up.  Patient knows to call with any concerns.  I would like to take this opportunity to thank you for allowing me to participate in the care of your patient..Noreene Filbert MD

## 2021-11-22 ENCOUNTER — Other Ambulatory Visit: Payer: Self-pay

## 2021-11-28 DIAGNOSIS — C541 Malignant neoplasm of endometrium: Secondary | ICD-10-CM | POA: Insufficient documentation

## 2021-11-29 ENCOUNTER — Encounter: Payer: Self-pay | Admitting: Oncology

## 2021-11-29 ENCOUNTER — Inpatient Hospital Stay (HOSPITAL_BASED_OUTPATIENT_CLINIC_OR_DEPARTMENT_OTHER): Payer: Medicare PPO | Admitting: Oncology

## 2021-11-29 ENCOUNTER — Inpatient Hospital Stay: Payer: Medicare PPO

## 2021-11-29 DIAGNOSIS — C541 Malignant neoplasm of endometrium: Secondary | ICD-10-CM

## 2021-11-29 DIAGNOSIS — D3500 Benign neoplasm of unspecified adrenal gland: Secondary | ICD-10-CM | POA: Diagnosis not present

## 2021-11-29 DIAGNOSIS — D6481 Anemia due to antineoplastic chemotherapy: Secondary | ICD-10-CM | POA: Diagnosis not present

## 2021-11-29 DIAGNOSIS — T451X5A Adverse effect of antineoplastic and immunosuppressive drugs, initial encounter: Secondary | ICD-10-CM

## 2021-11-29 DIAGNOSIS — Z5111 Encounter for antineoplastic chemotherapy: Secondary | ICD-10-CM | POA: Diagnosis not present

## 2021-11-29 DIAGNOSIS — D3502 Benign neoplasm of left adrenal gland: Secondary | ICD-10-CM | POA: Diagnosis not present

## 2021-11-29 DIAGNOSIS — E278 Other specified disorders of adrenal gland: Secondary | ICD-10-CM

## 2021-11-29 DIAGNOSIS — Z5112 Encounter for antineoplastic immunotherapy: Secondary | ICD-10-CM | POA: Diagnosis not present

## 2021-11-29 LAB — COMPREHENSIVE METABOLIC PANEL
ALT: 12 U/L (ref 0–44)
AST: 20 U/L (ref 15–41)
Albumin: 3.5 g/dL (ref 3.5–5.0)
Alkaline Phosphatase: 63 U/L (ref 38–126)
Anion gap: 9 (ref 5–15)
BUN: 25 mg/dL — ABNORMAL HIGH (ref 8–23)
CO2: 26 mmol/L (ref 22–32)
Calcium: 9.2 mg/dL (ref 8.9–10.3)
Chloride: 104 mmol/L (ref 98–111)
Creatinine, Ser: 0.88 mg/dL (ref 0.44–1.00)
GFR, Estimated: >60 mL/min (ref >60)
Glucose, Bld: 161 mg/dL — ABNORMAL HIGH (ref 70–99)
Potassium: 3.6 mmol/L (ref 3.5–5.1)
Sodium: 139 mmol/L (ref 135–145)
Total Bilirubin: 0.5 mg/dL (ref 0.3–1.2)
Total Protein: 6.8 g/dL (ref 6.5–8.1)

## 2021-11-29 LAB — CBC WITH DIFFERENTIAL/PLATELET
Abs Immature Granulocytes: 0.02 10*3/uL (ref 0.00–0.07)
Basophils Absolute: 0 10*3/uL (ref 0.0–0.1)
Basophils Relative: 0 %
Eosinophils Absolute: 0.1 10*3/uL (ref 0.0–0.5)
Eosinophils Relative: 2 %
HCT: 26.3 % — ABNORMAL LOW (ref 36.0–46.0)
Hemoglobin: 8.4 g/dL — ABNORMAL LOW (ref 12.0–15.0)
Immature Granulocytes: 1 %
Lymphocytes Relative: 12 %
Lymphs Abs: 0.5 10*3/uL — ABNORMAL LOW (ref 0.7–4.0)
MCH: 33.5 pg (ref 26.0–34.0)
MCHC: 31.9 g/dL (ref 30.0–36.0)
MCV: 104.8 fL — ABNORMAL HIGH (ref 80.0–100.0)
Monocytes Absolute: 0.6 10*3/uL (ref 0.1–1.0)
Monocytes Relative: 15 %
Neutro Abs: 3 10*3/uL (ref 1.7–7.7)
Neutrophils Relative %: 70 %
Platelets: 124 10*3/uL — ABNORMAL LOW (ref 150–400)
RBC: 2.51 MIL/uL — ABNORMAL LOW (ref 3.87–5.11)
RDW: 18.6 % — ABNORMAL HIGH (ref 11.5–15.5)
WBC: 4.1 10*3/uL (ref 4.0–10.5)
nRBC: 0 % (ref 0.0–0.2)

## 2021-11-29 LAB — TSH: TSH: 1.948 u[IU]/mL (ref 0.350–4.500)

## 2021-11-29 MED ORDER — HEPARIN SOD (PORK) LOCK FLUSH 100 UNIT/ML IV SOLN
500.0000 [IU] | Freq: Once | INTRAVENOUS | Status: AC | PRN
  Filled 2021-11-29: qty 5

## 2021-11-29 MED ORDER — HEPARIN SOD (PORK) LOCK FLUSH 100 UNIT/ML IV SOLN
INTRAVENOUS | Status: AC
  Administered 2021-11-29: 500 [IU]
  Filled 2021-11-29: qty 5

## 2021-11-29 MED ORDER — SODIUM CHLORIDE 0.9 % IV SOLN
Freq: Once | INTRAVENOUS | Status: AC
  Filled 2021-11-29: qty 250

## 2021-11-29 MED ORDER — SODIUM CHLORIDE 0.9 % IV SOLN
200.0000 mg | Freq: Once | INTRAVENOUS | Status: AC
  Administered 2021-11-29: 200 mg via INTRAVENOUS
  Filled 2021-11-29: qty 8

## 2021-11-29 NOTE — Progress Notes (Signed)
Patient here for follow up. No new concerns voiced.  °

## 2021-11-29 NOTE — Assessment & Plan Note (Signed)
lipid poor hyperfunctioning adenoma vs mets.  FDG avid on PET scan.  Difficult biopsy due to patient's body habitus and deep position of the mass. Resolved on current PET scan, suggesting that this lesion might be a metastatic lesion. Continue CT surveillance. 

## 2021-11-29 NOTE — Assessment & Plan Note (Signed)
Treatment as planned above. 

## 2021-11-29 NOTE — Assessment & Plan Note (Signed)
She is currently on immunotherapy.  Anticipate hemoglobin to gradually improve

## 2021-11-29 NOTE — Assessment & Plan Note (Signed)
s/p carboplatin and Taxol for 3 cycles, PET scan showed complete response.  Imaging was reviewed and discussed with patient.  No residual hypermetabolism.  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  -plan to finish cycle 4 and 5 Carbo/Taxol, adding Keytruda, followed by [facility] maintenance.  Labs are reviewed and discussed with patient. Proceed with maintenance.  [place] repeat imaging for surveillance in October 2023.

## 2021-11-29 NOTE — Patient Instructions (Signed)
[facility]  Discharge Instructions: Thank you for choosing [facility] to provide your oncology and hematology care.  If you have a lab appointment with the Cancer Center, please go directly to the Cancer Center and check in at the registration area.  Wear comfortable clothing and clothing appropriate for easy access to any Portacath or PICC line.   We strive to give you quality time with your provider. You may need to reschedule your appointment if you arrive late (15 or more minutes).  Arriving late affects you and other patients whose appointments are after yours.  Also, if you miss three or more appointments without notifying the office, you may be dismissed from the clinic at the provider's discretion.      For prescription refill requests, have your pharmacy contact our office and allow 72 hours for refills to be completed.    Today you received the following chemotherapy and/or immunotherapy agents keytruda    To help prevent nausea and vomiting after your treatment, we encourage you to take your nausea medication as directed.  BELOW ARE SYMPTOMS THAT SHOULD BE REPORTED IMMEDIATELY: *FEVER GREATER THAN 100.4 F (38 C) OR HIGHER *CHILLS OR SWEATING *NAUSEA AND VOMITING THAT IS NOT CONTROLLED WITH YOUR NAUSEA MEDICATION *UNUSUAL SHORTNESS OF BREATH *UNUSUAL BRUISING OR BLEEDING *URINARY PROBLEMS (pain or burning when urinating, or frequent urination) *BOWEL PROBLEMS (unusual diarrhea, constipation, pain near the anus) TENDERNESS IN MOUTH AND THROAT WITH OR WITHOUT PRESENCE OF ULCERS (sore throat, sores in mouth, or a toothache) UNUSUAL RASH, SWELLING OR PAIN  UNUSUAL VAGINAL DISCHARGE OR ITCHING   Items with * indicate a potential emergency and should be followed up as soon as possible or go to the Emergency Department if any problems should occur.  Please show the CHEMOTHERAPY ALERT CARD or IMMUNOTHERAPY ALERT CARD at check-in to the  Emergency Department and triage nurse.  Should you have questions after your visit or need to cancel or reschedule your appointment, please contact [facility]  [phone] and follow the prompts.  Office hours are 8:00 a.m. to 4:30 p.m. Monday - Friday. Please note that voicemails left after 4:00 p.m. may not be returned until the following business day.  We are closed weekends and major holidays. You have access to a nurse at all times for urgent questions. Please call the main number to the clinic [phone] and follow the prompts.  For any non-urgent questions, you may also contact your provider using MyChart. We now offer e-Visits for anyone 18 and older to request care online for non-urgent symptoms. For details visit [email or web address].   Also download the MyChart app! Go to the app store, search "MyChart", open the app, select [facility], and log in with your MyChart username and password.  Masks are optional in the cancer centers. If you would like for your care team to wear a mask while they are taking care of you, please let them know. For doctor visits, patients may have with them one support person who is at least 70 years old. At this time, visitors are not allowed in the infusion area.   

## 2021-11-29 NOTE — Progress Notes (Signed)
Hematology/Oncology Progress note [phone] [phone]         Patient Care Team: Tracie Harrier, MD as PCP - General (Internal Medicine) Cammie Sickle, MD as Consulting Physician (Oncology)   CHIEF COMPLAINTS/REASON FOR VISIT:  Follow up for endometrial cancer   ASSESSMENT & PLAN:   Cancer Staging  Endometrial adenocarcinoma [facility]) Staging form: Corpus Uteri - Carcinoma and Carcinosarcoma, AJCC 8th Edition - Clinical stage from 04/10/2021: FIGO Stage III, calculated as Stage Unknown (cT3, cNX, cM0) - Signed by Earlie Server, MD on 08/24/2021   Endometrial adenocarcinoma ([place]) s/p carboplatin and Taxol for 3 cycles, PET scan showed complete response.  Imaging was reviewed and discussed with patient.  No residual hypermetabolism.  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  -plan to finish cycle 4 and 5 Carbo/Taxol, adding Keytruda, followed by [facility] maintenance.  Labs are reviewed and discussed with patient. Proceed with maintenance.  [place] repeat imaging for surveillance in October 2023.   Encounter for antineoplastic chemotherapy Treatment as planned above  Anemia due to antineoplastic chemotherapy She is currently on immunotherapy.  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  FDG avid on PET scan.  Difficult biopsy due to patient's body habitus and deep position of the mass. Resolved on current PET scan, suggesting that this lesion might be a metastatic lesion. Continue CT surveillance.   No orders of the defined types were placed in this encounter.   Return of visit:  3 weeks Lab MD Octaviano Batty, MD, [name] [facility] Hemat[facility] Oncology 11/29/2021     HISTORY OF PRESENTING ILLNESS:   Valerie Case is a  71 y.o.  female presents for follow up of  FIGO grade 3 poorly differentiated  endometrial adenocarcinoma. Oncology History  Endometrial adenocarcinoma ([place])  12/12/2020 Imaging   12/12/2020, CT abdomen pelvis with contrast showed no radiographic evidence of urinary tract neoplasm, calculi, hydronephrosis.  2.6 cm nonspecific left adrenal mass. 02/12/2021 CT hematuria work-up showed small uterine fibroids, no findings to explain hematuria.Hepatomegaly, diverticulosis without evidence of diverticulitis, Coronary artery disease   04/01/2021 -  Hospital Admission   04/01/2021 - 04/03/2021, patient was hospitalized due to symptomatic anemia, hemoglobin 6.9, heavy postmenopausal bleeding with passing large clots.  She also presented with increased creatinine level to 2.58 compared to her baseline level of 0.9 in November 2022.  Patient received IV iron infusion and 2 units of PRBC during the hospital stay..  04/02/2021, iron panel showed iron saturation 37, ferritin 37, TIBC 333-the studies were done after patient received blood transfusion.  At discharge, hemoglobin was 7.7.   04/01/2021 Initial Diagnosis   04/01/2020, endometrial biopsy showed poorly differentiated endometrial adenocarcinoma. Omniseq NGS showed TMB 48.4 mt/mb [high], MSI High, PD-L1-TPS <1%, PIK3CA H1505W, Negative for BRAF, HER2, NTRK1 RET.     04/10/2021 Cancer Staging   Staging form: Corpus Uteri - Carcinoma and Carcinosarcoma, AJCC 8th Edition - Clinical stage from 04/10/2021: FIGO Stage III, calculated as Stage Unknown (cT3, cNX, cM0) - Signed by Earlie Server, MD on 08/24/2021 Stage prefix: Initial diagnosis   04/15/2021 Imaging   PET showed 1. Large hypermetabolic endometrial mass consistent with known endometrial cancer. Suspect direct invasion/involvement of the right adnexa. Right pelvic sidewall hypermetabolic adenopathy.2. 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 3. No findings for metastatic disease involving  the  chest or bonystructures.     04/24/2021 - 05/29/2021 Radiation Therapy   status post radiation to pelvis   08/05/2021 Imaging   MRI abdomen w wo contrast 1. Stable solid enhancing 2.7 cm left adrenal lesion, which was hypermetabolic on prior PET/CTs but is unchanged in size dating back to December 12, 2020. 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 However, unfortunately metastatic disease can not be entirely excluded and remains a pertinent differential consideration. Comparison with more remote prior imaging would be the most valuable tool in the assessment of this lesion, as demonstrating long-term stability would indicate this to be a benign lesion. However, if no prior imaging can be made available, would consider follow-up adrenal protocol CT with and without intravenous contrast material in 3 months as this would allow for further assessment of stability as well as enhancement and washout characteristics of the lesion potentially allowing for better characterization versus direct tissue sampling.2. Hepatomegaly and hepatic steatosis.3. Colonic diverticulosis without findings of acute diverticulitis    08/16/2021 - 09/27/2021 Chemotherapy   UTERINE Carboplatin AUC 5 / Paclitaxel q21d x 3      10/03/2021 Imaging   PET 1. No residual hypermetabolism in the uterus suggesting an excellent response to treatment. No findings for metastatic disease. 2. Resolution of hypermetabolism in the left adrenal gland lesion suggesting this was metastatic disease. 3. Diffuse marrow hypermetabolism likely due to rebound from chemotherapy or marrow stimulating drugs   10/18/2021 - 11/08/2021 Chemotherapy   AUC 5 / Paclitaxel/ Keytruda Q21d  x 2 cycles   11/29/2021 -  Chemotherapy   Patient is on Treatment Plan : UTERINE Pembrolizumab (200) q21d          INTERVAL HISTORY Valerie Case is a 71 y.o. female who has above history reviewed by me today presents for follow up visit for anemia and endometrial cancer Overall she tolerates treatment, + Fatigue. No nausea vomiting diarrhea.   Review of Systems  Constitutional:  Positive for fatigue. Negative for chills and fever.  HENT:   Negative for hearing loss and voice change.   Eyes:  Negative for eye problems.  Respiratory:  Negative for chest tightness and cough.   Cardiovascular:  Negative for chest pain.  Gastrointestinal:  Negative for abdominal distention, abdominal pain and blood in stool.  Endocrine: Negative for hot flashes.  Genitourinary:  Negative for difficulty urinating, frequency and vaginal bleeding.   Musculoskeletal:  Positive for arthralgias.  Skin:  Negative for itching and rash.  Neurological:  Negative for extremity weakness.  Hematological:  Negative for adenopathy.  Psychiatric/Behavioral:  Negative for confusion.     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    SURGICAL HISTORY: Past Surgical History:  Procedure Laterality Date   CESAREAN SECTION  01/23/1979   IR IMAGING GUIDED PORT INSERTION  08/09/2021   TONSILLECTOMY  03/17/1956    SOCIAL HISTORY: Social History   Socioeconomic History   Marital status: Divorced    Spouse name: Not on file   Number of children: Not on file  Years of education: Not on file   Highest education level: Not on file  Occupational History   Not on file  Tobacco Use   Smoking status: Never   Smokeless tobacco: Never  Vaping Use   Vaping Use: Never used  Substance and Sexual Activity   Alcohol use: Not Currently   Drug use: Never    Sexual activity: Not Currently  Other Topics Concern   Not on file  Social History Narrative      Social Determinants of Health   Financial Resource Strain: Low Risk  (09/27/2021)   Overall Financial Resource Strain (CARDIA)    Difficulty of Paying Living Expenses: Not very hard  Food Insecurity: No Food Insecurity (09/27/2021)   Hunger Vital Sign    Worried About Running Out of Food in the Last Year: Never true    Ran Out of Food in the Last Year: Never true  Transportation Needs: No Transportation Needs (09/27/2021)   PRAPARE - Hydrologist (Medical): No    Lack of Transportation (Non-Medical): No  Physical Activity: Inactive (09/27/2021)   Exercise Vital Sign    Days of Exercise per Week: 0 days    Minutes of Exercise per Session: 0 min  Stress: Stress Concern Present (09/27/2021)   [place]    Feeling of Stress : Rather much  Social Connections: Moderately Integrated (09/27/2021)   Social Connection and Isolation Panel [NHANES]    Frequency of Communication with Friends and Family: Three times a week    Frequency of Social Gatherings with Friends and Family: Three times a week    Attends Religious Services: 1 to 4 times per year    Active Member of Clubs or Organizations: No    Attends Archivist Meetings: 1 to 4 times per year    Marital Status: Divorced  Intimate Partner Violence: Not At Risk (09/27/2021)   Humiliation, Afraid, Rape, and Kick questionnaire    Fear of Current or Ex-Partner: No    Emotionally Abused: No    Physically Abused: No    Sexually Abused: No    FAMILY HISTORY: Family History  Problem Relation Age of Onset   Breast cancer Mother 45   Cancer Mother    Cancer Father    Lung cancer Father    Breast cancer Cousin        maternal side    ALLERGIES:  is allergic to aspirin.  MEDICATIONS:  Current Outpatient Medications  Medication Sig  Dispense Refill   acetaminophen (TYLENOL) 650 MG CR tablet Take 1,300 mg by mouth every 8 (eight) hours as needed for pain.     albuterol (VENTOLIN HFA) 108 (90 Base) MCG/ACT inhaler Inhale 2 puffs into the lungs every 6 (six) hours as needed for wheezing or shortness of breath.     Cranberry-Vitamin C-Probiotic (AZO CRANBERRY PO) Take by mouth.     docusate sodium (COLACE) 100 MG capsule Take 1 capsule (100 mg total) by mouth 2 (two) times daily. 60 capsule 1   empagliflozin (JARDIANCE) 25 MG TABS tablet Take 25 mg by mouth daily.     ferrous sulfate 325 (65 FE) MG EC tablet Take 325 mg by mouth daily with breakfast.     FLUoxetine (PROZAC) 10 MG capsule Take 10 mg by mouth daily.     fluticasone (FLONASE) 50 MCG/ACT nasal spray Place 1 spray into both nostrils daily as needed for allergies or rhinitis.  hydrochlorothiazide (HYDRODIURIL) 12.5 MG tablet Take 12.5 mg by mouth daily.     hydrocortisone 2.5 % cream Apply topically 2 (two) times daily. 30 g 2   ibuprofen (ADVIL) 200 MG tablet Take 400 mg by mouth every 8 (eight) hours as needed for moderate pain.     lidocaine-prilocaine (EMLA) cream Apply to affected area once 30 g 3   lisinopril (ZESTRIL) 40 MG tablet Take 40 mg by mouth at bedtime.     LORazepam (ATIVAN) 0.5 MG tablet Take 1 tablet (0.5 mg total) by mouth every 8 (eight) hours as needed for anxiety (nausea vomiting). 30 tablet 0   metFORMIN (GLUCOPHAGE-XR) 500 MG 24 hr tablet Take 1,000 mg by mouth at bedtime.     metoprolol succinate (TOPROL-XL) 25 MG 24 hr tablet Take 25 mg by mouth daily.     NOVOLIN 70/30 FLEXPEN (70-30) 100 UNIT/ML KwikPen 50-110 Units See admin instructions. 50 units in the morning, 110 units at bedtime     ondansetron (ZOFRAN) 8 MG tablet Take 1 tablet (8 mg total) by mouth 2 (two) times daily as needed. Start on the third day after chemotherapy. 30 tablet 1   OZEMPIC, 1 MG/DOSE, 4 MG/3ML SOPN Inject 1 mg into the skin every Tuesday.     prochlorperazine  (COMPAZINE) 10 MG tablet Take 1 tablet (10 mg total) by mouth every 6 (six) hours as needed (Nausea or vomiting). 30 tablet 1   rosuvastatin (CRESTOR) 10 MG tablet Take 10 mg by mouth daily.     traMADol (ULTRAM) 50 MG tablet Take 2 tablets (100 mg total) by mouth every 6 (six) hours as needed. 90 tablet 0   No current facility-administered medications for this visit.     PHYSICAL EXAMINATION: ECOG PERFORMANCE STATUS: 1 - Symptomatic but completely ambulatory  Physical Exam Constitutional:      General: She is not in acute distress.    Appearance: She is obese.  HENT:     Head: Normocephalic and atraumatic.  Eyes:     General: No scleral icterus. Cardiovascular:     Rate and Rhythm: Normal rate and regular rhythm.     Heart sounds: Normal heart sounds.  Pulmonary:     Effort: Pulmonary effort is normal. No respiratory distress.     Breath sounds: No wheezing.  Abdominal:     General: Bowel sounds are normal. There is no distension.     Palpations: Abdomen is soft.  Musculoskeletal:        General: No deformity. Normal range of motion.     Cervical back: Normal range of motion and neck supple.     Right lower leg: Edema present.     Left lower leg: Edema present.     Comments:    Skin:    General: Skin is warm and dry.     Findings: No erythema or rash.     Comments: Chronic venous sufficiency skin changes  Neurological:     Mental Status: She is alert and oriented to person, place, and time. Mental status is at baseline.     Cranial Nerves: No cranial nerve deficit.     Coordination: Coordination normal.  Psychiatric:        Mood and Affect: Mood normal.      LABORATORY DATA:  I have reviewed the data as listed Lab Results  Component Value Date   WBC 4.1 11/29/2021   HGB 8.4 (L) 11/29/2021   HCT 26.3 (L) 11/29/2021   MCV 104.8 (H)  11/29/2021   PLT 124 (L) 11/29/2021   Recent Labs    04/11/21 1553 05/17/21 1024 10/18/21 0840 11/08/21 0757 11/29/21 0842   NA  --    < > 141 138 139  K  --    < > 3.7 3.9 3.6  CL  --    < > 104 106 104  CO2  --    < > 26 24 26   GLUCOSE  --    < > 140* 158* 161*  BUN  --    < > 19 20 25*  CREATININE  --    < > 0.81 0.77 0.88  CALCIUM  --    < > 10.4* 9.5 9.2  GFRNONAA  --    < > >60 >60 >60  PROT  --    < > 7.2 7.0 6.8  ALBUMIN  --    < > 3.7 3.4* 3.5  AST  --    < > 22 20 20   ALT  --    < > 15 13 12   ALKPHOS  --    < > 66 58 63  BILITOT 0.3   < > 0.2* 0.3 0.5  BILIDIR <0.1  --   --   --   --   IBILI NOT CALCULATED  --   --   --   --    < > = values in this interval not displayed.    Iron/TIBC/Ferritin/ %Sat    Component Value Date/Time   IRON 53 07/31/2021 1511   IRON 84 06/08/2012 1128   TIBC 357 07/31/2021 1511   TIBC 403 06/08/2012 1128   FERRITIN 44 07/31/2021 1511   FERRITIN 68 06/08/2012 1128   IRONPCTSAT 15 07/31/2021 1511   IRONPCTSAT 21 06/08/2012 1128      RADIOGRAPHIC STUDIES: I have personally reviewed the radiological images as listed and agreed with the findings in the report. NM PET Image Restag (PS) Skull Base To Thigh  Result Date: 10/04/2021 CLINICAL DATA:  Subsequent treatment strategy for endometrial carcinoma. EXAM: NUCLEAR MEDICINE PET SKULL BASE TO THIGH TECHNIQUE: 14.57 mCi F-18 FDG was injected intravenously. Full-ring PET imaging was performed from the skull base to thigh after the radiotracer. CT data was obtained and used for attenuation correction and anatomic localization. Fasting blood glucose: 151 mg/dl COMPARISON:  Prior PET CTs from 04/15/2021 and 06/10/2021. Recent MRI abdomen 08/05/2021 FINDINGS: Mediastinal blood pool activity: SUV max 1.32 Liver activity: SUV max NA NECK: No hypermetabolic lymph nodes in the neck. Incidental CT findings: Right IJ catheter in good position. CHEST: No hypermetabolic mediastinal or hilar nodes. No suspicious pulmonary nodules on the CT scan. No hypermetabolic breast masses, supraclavicular or axillary adenopathy. Incidental CT  findings: Stable cardiac enlargement. Stable atherosclerotic calcifications involving the aorta and coronary arteries. ABDOMEN/PELVIS: I do not see any residual hypermetabolism involving the uterus suggesting an excellent response to treatment. No enlarged or hypermetabolic pelvic lymph nodes. No evidence of omental or peritoneal surface disease. No evidence of metastatic disease involving the liver or adrenal glands. There is a stable 2.5 cm left adrenal gland lesion. This was hypermetabolic on the prior PET-CT with SUV max of 6.87 but this has completely resolved suggesting it is treated metastatic disease. Incidental CT findings: Stable large left-sided abdominal wall hernia. Stable descending and sigmoid colon diverticulosis. SKELETON: Diffuse hypermetabolic marrow suggesting rebound from chemotherapy or marrow stimulating drugs. Incidental CT findings: none IMPRESSION: 1. No residual hypermetabolism in the uterus suggesting an excellent response to treatment.  No findings for metastatic disease. 2. Resolution of hypermetabolism in the left adrenal gland lesion suggesting this was metastatic disease. 3. Diffuse marrow hypermetabolism likely due to rebound from chemotherapy or marrow stimulating drugs. Electronically Signed   By: Marijo Sanes M.D.   On: 10/04/2021 11:08   MM DIAG BREAST TOMO BILATERAL  Result Date: 09/04/2021 CLINICAL DATA:  Nonfocal LEFT breast pain. Currently under treatment for uterine cancer. EXAM: DIGITAL DIAGNOSTIC BILATERAL MAMMOGRAM WITH TOMOSYNTHESIS AND CAD TECHNIQUE: Bilateral digital diagnostic mammography and breast tomosynthesis was performed. The images were evaluated with computer-aided detection. COMPARISON:  Previous exam(s). ACR Breast Density Category a: The breast tissue is almost entirely fatty. FINDINGS: Diagnostic mammographic images were obtained over the area of pain in the LEFT breast. No suspicious mammographic finding is identified in this area. No suspicious  mass, microcalcification, or other finding is identified in either breast. No suspicious mammographic etiology for breast pain identified. IMPRESSION: No mammographic evidence of malignancy bilaterally. No suspicious mammographic etiology for nonfocal LEFT breast pain is identified. Any further workup of the patient's symptoms should be based on the clinical assessment. Recommend routine annual screening mammogram in 1 year. RECOMMENDATION: Screening mammogram in one year.(Code:SM-B-01Y) I have discussed the findings and recommendations with the patient. If applicable, a reminder letter will be sent to the patient regarding the next appointment. BI-RADS CATEGORY  1: Negative. Electronically Signed   By: Valentino Saxon M.D.   On: 09/04/2021 11:13      cc Tracie Harrier, MD

## 2021-12-04 ENCOUNTER — Inpatient Hospital Stay (HOSPITAL_BASED_OUTPATIENT_CLINIC_OR_DEPARTMENT_OTHER): Payer: Medicare PPO | Admitting: Licensed Clinical Social Worker

## 2021-12-04 ENCOUNTER — Inpatient Hospital Stay: Payer: Medicare PPO

## 2021-12-04 ENCOUNTER — Encounter: Payer: Self-pay | Admitting: Licensed Clinical Social Worker

## 2021-12-04 DIAGNOSIS — Z801 Family history of malignant neoplasm of trachea, bronchus and lung: Secondary | ICD-10-CM | POA: Diagnosis not present

## 2021-12-04 DIAGNOSIS — Z803 Family history of malignant neoplasm of breast: Secondary | ICD-10-CM

## 2021-12-04 DIAGNOSIS — C541 Malignant neoplasm of endometrium: Secondary | ICD-10-CM | POA: Diagnosis not present

## 2021-12-04 NOTE — Progress Notes (Signed)
REFERRING PROVIDER: Verlon Au, NP [place],  [place] [address]  PRIMARY PROVIDER:  Tracie Harrier, MD  PRIMARY REASON FOR VISIT:  1. Endometrial adenocarcinoma ([place])   2. Family history of breast cancer   3. Family history of lung cancer     HISTORY OF PRESENT ILLNESS:   Valerie Case, a 70 y.o. female, was seen for a [facility] cancer genetics consultation at the request of Beckey Rutter NP due to a personal and family history of cancer.  Valerie Case presents to clinic today to discuss the possibility of a hereditary predisposition to cancer, genetic testing, and to further clarify her future cancer risks, as well as potential cancer risks for family members.   In 2023 at the age of 61, Valerie Case was diagnosed with endometrial cancer. This has been treated with radiation and chemotherapy thus far. Tumor testing showed MSI-H.   CANCER HISTORY:  Oncology History  Endometrial adenocarcinoma ([place])  12/12/2020 Imaging   12/12/2020, CT abdomen pelvis with contrast showed no radiographic evidence of urinary tract neoplasm, calculi, hydronephrosis.  2.6 cm nonspecific left adrenal mass. 02/12/2021 CT hematuria work-up showed small uterine fibroids, no findings to explain hematuria.Hepatomegaly, diverticulosis without evidence of diverticulitis, Coronary artery disease   04/01/2021 -  Hospital Admission   04/01/2021 - 04/03/2021, patient was hospitalized due to symptomatic anemia, hemoglobin 6.9, heavy postmenopausal bleeding with passing large clots.  She also presented with increased creatinine level to 2.58 compared to her baseline level of 0.9 in November 2022.  Patient received IV iron infusion and 2 units of PRBC during the hospital stay..  04/02/2021, iron panel showed iron saturation 37, ferritin 37, TIBC 333-the studies were done after patient received blood transfusion.  At discharge, hemoglobin was 7.7.   04/01/2021 Initial Diagnosis   04/01/2020, endometrial biopsy  showed poorly differentiated endometrial adenocarcinoma. [facility] NGS showed TMB 48.4 mt/mb [high], MSI High, PD-L1-TPS <1%, PIK3CA T6144R, Negative for BRAF, HER2, NTRK1 RET.     04/10/2021 Cancer Staging   Staging form: Corpus Uteri - Carcinoma and Carcinosarcoma, AJCC 8th Edition - Clinical stage from 04/10/2021: FIGO Stage III, calculated as Stage Unknown (cT3, cNX, cM0) - Signed by Earlie Server, MD on 08/24/2021 Stage prefix: Initial diagnosis   04/15/2021 Imaging   PET showed 1. Large hypermetabolic endometrial mass consistent with known endometrial cancer. Suspect direct invasion/involvement of the right adnexa. Right pelvic sidewall hypermetabolic adenopathy.2. 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 3. No findings for metastatic disease involving the chest or bonystructures.     04/24/2021 - 05/29/2021 Radiation Therapy   status post radiation to pelvis   08/05/2021 Imaging   MRI abdomen w wo contrast 1. Stable solid enhancing 2.7 cm left adrenal lesion, which was hypermetabolic on prior PET/CTs but is unchanged in size dating back to December 12, 2020. 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 However, unfortunately metastatic disease can not be entirely excluded and remains a pertinent differential consideration. Comparison with more remote prior imaging would be the most valuable tool in the assessment of this lesion, as demonstrating long-term stability would indicate this to be a benign lesion. However, if no prior imaging can be made available, would consider follow-up adrenal protocol CT with and without intravenous contrast material in 3 months as this  would allow for further assessment of stability as well as enhancement and  washout characteristics of the lesion potentially allowing for better characterization versus direct tissue sampling.2. Hepatomegaly and hepatic steatosis.3. Colonic diverticulosis without findings of acute diverticulitis    08/16/2021 - 09/27/2021 Chemotherapy   UTERINE Carboplatin AUC 5 / Paclitaxel q21d x 3      10/03/2021 Imaging   PET 1. No residual hypermetabolism in the uterus suggesting an excellent response to treatment. No findings for metastatic disease. 2. Resolution of hypermetabolism in the left adrenal gland lesion suggesting this was metastatic disease. 3. Diffuse marrow hypermetabolism likely due to rebound from chemotherapy or marrow stimulating drugs   10/18/2021 - 11/08/2021 Chemotherapy   AUC 5 / Paclitaxel/ Keytruda Q21d  x 2 cycles   11/29/2021 -  Chemotherapy   Patient is on Treatment Plan : UTERINE Pembrolizumab (200) q21d       RISK FACTORS:  Menarche was at age 55-10.  First live birth at age 44.  OCP use for approximately 1 years.  Ovaries intact: yes.  Hysterectomy: no.  Menopausal status: postmenopausal.  HRT use: 1 years. Colonoscopy: within last 10 years Mammogram within the last year: yes. Number of breast biopsies: 0.  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  Significant diagnoses are listed below: Family History  Problem Relation Age of Onset   Breast cancer Mother 21   Cancer Mother    Cancer Father    Lung cancer Father    Breast cancer Cousin         dx 55s maternal   Valerie Case has 1 daughter, Valerie Case, 56. She has 1 sister and 1 brother, neither had cancer.   Valerie Case mother had breast cancer at 10 and passed at 70. One maternal cousin had breast cancer in her 74s and is living in her 40s. No other known cancers on this side of the family.  Valerie Case father died of lung cancer at 7 and had history of smoking. No other known cancers on this side of the family.  Valerie Case is unaware of previous family history of genetic testing for hereditary cancer risks. There is no reported Ashkenazi Jewish ancestry. There is no known consanguinity.    GENETIC COUNSELING ASSESSMENT: Valerie Case is a 71 y.o. female with a personal and family history which is somewhat suggestive of a hereditary cancer syndrome and predisposition to cancer. We, therefore, discussed and recommended the following at today's visit.   DISCUSSION: We discussed that approximately 10% of cancer is hereditary. Most cases of hereditary endometrial cancer are associated with Lynch syndrome, and most hereditary breast cancer are associated with BRCA1/BRCA2 genes, although there are other genes associated with hereditary cancer as well. Cancers and risks are gene specific. We discussed that testing is beneficial for several reasons including knowing about cancer risks, identifying potential screening and risk-reduction options that may be appropriate, and to understand if other family members could be at risk for  cancer and allow them to undergo genetic testing.   We reviewed the characteristics, features and inheritance patterns of hereditary cancer syndromes. We also discussed genetic testing, including the appropriate family members to test, the process of testing, insurance coverage and turn-around-time for results. We discussed the implications of a negative, positive and/or variant of uncertain significant result. We recommended Valerie Case pursue genetic testing for the  [employer] Common Hereditary Cancers+RNA gene panel.   Based on Valerie Case's personal and family history of cancer, she meets medical criteria for genetic testing. Despite that she meets criteria, she may still have an out of pocket cost. We discussed that if her out of pocket cost for testing is over $100, the laboratory will call and confirm whether she wants to proceed with testing.  If the out of pocket cost of testing is less than $100 she will be billed by the genetic testing laboratory.   PLAN: After considering the risks, benefits, and limitations,Valerie Case did not wish to pursue genetic testing at today's visit. She would like to take a few days/weeks to think about it and will call if she decides to do testing and have blood drawn during her port flush. We understand this decision and remain available to coordinate genetic testing at any time in the future. We, therefore, recommend Valerie Case continue to follow the cancer screening guidelines given by her primary healthcare provider. Valerie Case questions were answered to her satisfaction today. Our contact information was provided should additional questions or concerns arise. Thank you for the referral and allowing [place] to share in the care of your patient.   Valerie Rogue, MS, [facility] Genetic Counselor [email or web address] Phone: [phone]  The patient was seen for a total of 45 minutes in face-to-face genetic counseling. Patient's daughter Valerie Case was also present.  Valerie Case was available for discussion regarding this case.   _______________________________________________________________________ For Office Staff:  Number of people involved in session: 2 Was an Intern/ student involved with case: yes; [employer] intern Blenda Nicely was present and assisted with this case.

## 2021-12-20 ENCOUNTER — Inpatient Hospital Stay: Payer: Medicare PPO

## 2021-12-20 ENCOUNTER — Inpatient Hospital Stay: Payer: Medicare PPO | Attending: Oncology | Admitting: Oncology

## 2021-12-20 ENCOUNTER — Encounter: Payer: Self-pay | Admitting: Oncology

## 2021-12-20 DIAGNOSIS — Z5112 Encounter for antineoplastic immunotherapy: Secondary | ICD-10-CM | POA: Diagnosis not present

## 2021-12-20 DIAGNOSIS — T451X5A Adverse effect of antineoplastic and immunosuppressive drugs, initial encounter: Secondary | ICD-10-CM | POA: Insufficient documentation

## 2021-12-20 DIAGNOSIS — F32A Depression, unspecified: Secondary | ICD-10-CM | POA: Diagnosis not present

## 2021-12-20 DIAGNOSIS — Z923 Personal history of irradiation: Secondary | ICD-10-CM | POA: Diagnosis not present

## 2021-12-20 DIAGNOSIS — I10 Essential (primary) hypertension: Secondary | ICD-10-CM | POA: Insufficient documentation

## 2021-12-20 DIAGNOSIS — M2550 Pain in unspecified joint: Secondary | ICD-10-CM | POA: Diagnosis not present

## 2021-12-20 DIAGNOSIS — Z886 Allergy status to analgesic agent status: Secondary | ICD-10-CM | POA: Diagnosis not present

## 2021-12-20 DIAGNOSIS — Z79899 Other long term (current) drug therapy: Secondary | ICD-10-CM | POA: Diagnosis not present

## 2021-12-20 DIAGNOSIS — C541 Malignant neoplasm of endometrium: Secondary | ICD-10-CM

## 2021-12-20 DIAGNOSIS — Z809 Family history of malignant neoplasm, unspecified: Secondary | ICD-10-CM | POA: Diagnosis not present

## 2021-12-20 DIAGNOSIS — Z6841 Body Mass Index (BMI) 40.0 and over, adult: Secondary | ICD-10-CM | POA: Diagnosis not present

## 2021-12-20 DIAGNOSIS — Z86018 Personal history of other benign neoplasm: Secondary | ICD-10-CM | POA: Insufficient documentation

## 2021-12-20 DIAGNOSIS — E279 Disorder of adrenal gland, unspecified: Secondary | ICD-10-CM | POA: Insufficient documentation

## 2021-12-20 DIAGNOSIS — K5730 Diverticulosis of large intestine without perforation or abscess without bleeding: Secondary | ICD-10-CM | POA: Diagnosis not present

## 2021-12-20 DIAGNOSIS — Z801 Family history of malignant neoplasm of trachea, bronchus and lung: Secondary | ICD-10-CM | POA: Insufficient documentation

## 2021-12-20 DIAGNOSIS — Z803 Family history of malignant neoplasm of breast: Secondary | ICD-10-CM | POA: Insufficient documentation

## 2021-12-20 DIAGNOSIS — R5383 Other fatigue: Secondary | ICD-10-CM | POA: Insufficient documentation

## 2021-12-20 DIAGNOSIS — K760 Fatty (change of) liver, not elsewhere classified: Secondary | ICD-10-CM | POA: Insufficient documentation

## 2021-12-20 DIAGNOSIS — D6481 Anemia due to antineoplastic chemotherapy: Secondary | ICD-10-CM | POA: Diagnosis not present

## 2021-12-20 DIAGNOSIS — E119 Type 2 diabetes mellitus without complications: Secondary | ICD-10-CM | POA: Diagnosis not present

## 2021-12-20 DIAGNOSIS — Z794 Long term (current) use of insulin: Secondary | ICD-10-CM

## 2021-12-20 LAB — COMPREHENSIVE METABOLIC PANEL
ALT: 14 U/L (ref 0–44)
AST: 25 U/L (ref 15–41)
Albumin: 3.8 g/dL (ref 3.5–5.0)
Alkaline Phosphatase: 66 U/L (ref 38–126)
Anion gap: 9 (ref 5–15)
BUN: 21 mg/dL (ref 8–23)
CO2: 23 mmol/L (ref 22–32)
Calcium: 9.3 mg/dL (ref 8.9–10.3)
Chloride: 107 mmol/L (ref 98–111)
Creatinine, Ser: 0.92 mg/dL (ref 0.44–1.00)
GFR, Estimated: >60 mL/min (ref >60)
Glucose, Bld: 134 mg/dL — ABNORMAL HIGH (ref 70–99)
Potassium: 3.4 mmol/L — ABNORMAL LOW (ref 3.5–5.1)
Sodium: 139 mmol/L (ref 135–145)
Total Bilirubin: 0.4 mg/dL (ref 0.3–1.2)
Total Protein: 7.4 g/dL (ref 6.5–8.1)

## 2021-12-20 LAB — CBC WITH DIFFERENTIAL/PLATELET
Abs Immature Granulocytes: 0.03 10*3/uL (ref 0.00–0.07)
Basophils Absolute: 0 10*3/uL (ref 0.0–0.1)
Basophils Relative: 1 %
Eosinophils Absolute: 0.5 10*3/uL (ref 0.0–0.5)
Eosinophils Relative: 6 %
HCT: 33.7 % — ABNORMAL LOW (ref 36.0–46.0)
Hemoglobin: 10.8 g/dL — ABNORMAL LOW (ref 12.0–15.0)
Immature Granulocytes: 0 %
Lymphocytes Relative: 16 %
Lymphs Abs: 1.2 10*3/uL (ref 0.7–4.0)
MCH: 33.3 pg (ref 26.0–34.0)
MCHC: 32 g/dL (ref 30.0–36.0)
MCV: 104 fL — ABNORMAL HIGH (ref 80.0–100.0)
Monocytes Absolute: 0.9 10*3/uL (ref 0.1–1.0)
Monocytes Relative: 13 %
Neutro Abs: 4.5 10*3/uL (ref 1.7–7.7)
Neutrophils Relative %: 64 %
Platelets: 279 10*3/uL (ref 150–400)
RBC: 3.24 MIL/uL — ABNORMAL LOW (ref 3.87–5.11)
RDW: 15.1 % (ref 11.5–15.5)
WBC: 7.1 10*3/uL (ref 4.0–10.5)
nRBC: 0 % (ref 0.0–0.2)

## 2021-12-20 MED ORDER — SODIUM CHLORIDE 0.9 % IV SOLN
Freq: Once | INTRAVENOUS | Status: AC
  Filled 2021-12-20: qty 250

## 2021-12-20 MED ORDER — POTASSIUM CHLORIDE CRYS ER 20 MEQ PO TBCR: 20.0000 meq | EXTENDED_RELEASE_TABLET | Freq: Every day | ORAL | 0 refills | Status: DC

## 2021-12-20 MED ORDER — SODIUM CHLORIDE 0.9 % IV SOLN
200.0000 mg | Freq: Once | INTRAVENOUS | Status: AC
  Administered 2021-12-20: 200 mg via INTRAVENOUS
  Filled 2021-12-20: qty 8

## 2021-12-20 MED ORDER — HEPARIN SOD (PORK) LOCK FLUSH 100 UNIT/ML IV SOLN
500.0000 [IU] | Freq: Once | INTRAVENOUS | Status: AC | PRN
  Administered 2021-12-20: 500 [IU]
  Filled 2021-12-20: qty 5

## 2021-12-20 NOTE — Assessment & Plan Note (Signed)
She is currently on immunotherapy.  Hemoglobin improved.  

## 2021-12-20 NOTE — Patient Instructions (Signed)
[facility]  Discharge Instructions: Thank you for choosing [facility] to provide your oncology and hematology care.  If you have a lab appointment with the Cancer Center, please go directly to the Cancer Center and check in at the registration area.  Wear comfortable clothing and clothing appropriate for easy access to any Portacath or PICC line.   We strive to give you quality time with your provider. You may need to reschedule your appointment if you arrive late (15 or more minutes).  Arriving late affects you and other patients whose appointments are after yours.  Also, if you miss three or more appointments without notifying the office, you may be dismissed from the clinic at the provider's discretion.      For prescription refill requests, have your pharmacy contact our office and allow 72 hours for refills to be completed.    Today you received the following chemotherapy and/or immunotherapy agents Keytruda      To help prevent nausea and vomiting after your treatment, we encourage you to take your nausea medication as directed.  BELOW ARE SYMPTOMS THAT SHOULD BE REPORTED IMMEDIATELY: *FEVER GREATER THAN 100.4 F (38 C) OR HIGHER *CHILLS OR SWEATING *NAUSEA AND VOMITING THAT IS NOT CONTROLLED WITH YOUR NAUSEA MEDICATION *UNUSUAL SHORTNESS OF BREATH *UNUSUAL BRUISING OR BLEEDING *URINARY PROBLEMS (pain or burning when urinating, or frequent urination) *BOWEL PROBLEMS (unusual diarrhea, constipation, pain near the anus) TENDERNESS IN MOUTH AND THROAT WITH OR WITHOUT PRESENCE OF ULCERS (sore throat, sores in mouth, or a toothache) UNUSUAL RASH, SWELLING OR PAIN  UNUSUAL VAGINAL DISCHARGE OR ITCHING   Items with * indicate a potential emergency and should be followed up as soon as possible or go to the Emergency Department if any problems should occur.  Please show the CHEMOTHERAPY ALERT CARD or IMMUNOTHERAPY ALERT CARD at check-in to  the Emergency Department and triage nurse.  Should you have questions after your visit or need to cancel or reschedule your appointment, please contact [facility]  [phone] and follow the prompts.  Office hours are 8:00 a.m. to 4:30 p.m. Monday - Friday. Please note that voicemails left after 4:00 p.m. may not be returned until the following business day.  We are closed weekends and major holidays. You have access to a nurse at all times for urgent questions. Please call the main number to the clinic [phone] and follow the prompts.  For any non-urgent questions, you may also contact your provider using MyChart. We now offer e-Visits for anyone 18 and older to request care online for non-urgent symptoms. For details visit [email or web address].   Also download the MyChart app! Go to the app store, search "MyChart", open the app, select [place], and log in with your MyChart username and password.  Masks are optional in the cancer centers. If you would like for your care team to wear a mask while they are taking care of you, please let them know. For doctor visits, patients may have with them one support person who is at least 70 years old. At this time, visitors are not allowed in the infusion area.   

## 2021-12-20 NOTE — Assessment & Plan Note (Signed)
discussed about lifestyle modification, healthy diet and exercise.   

## 2021-12-20 NOTE — Progress Notes (Signed)
Nutrition Follow-up:  Patient with anemia and endometrial cancer, followed by Dr Tasia Catchings.  Patient received radiation (04/24/21-05/01/21) Completed taxol and carbo.  Patient now on keytruda.  Met with patient during infusion.  Patient reports that she is eating.  Appetite and volume of food is less than before treatment but still eating.  Trying to include good sources of protein (eggs, meats, cottage cheese).  Keeping bowels moving with miralax, prune juice.      Medications: reviewed  Labs: reviewed  Anthropometrics:   Weight 274 lb 3.2 oz on 10/6 277 lb on 8/4 279 lb on 7/14 279 lb on 6/23 289 lb on 6/1 292 lb on 10/31   NUTRITION DIAGNOSIS: Inadequate oral intake stable    INTERVENTION:  Continue good sources of protein and incorporating plant foods in diet.      MONITORING, EVALUATION, GOAL: weight trends, intake   NEXT VISIT: as needed  Valerie Case, [place], [place] Registered Dietitian [phone]

## 2021-12-20 NOTE — Assessment & Plan Note (Signed)
Treatment plan as listed above. 

## 2021-12-20 NOTE — Assessment & Plan Note (Addendum)
s/p carboplatin and Taxol for 3 cycles, PET scan showed complete response.  Imaging was reviewed and discussed with patient.  No residual hypermetabolism.  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  Labs are reviewed and discussed with patient. Proceed with maintenance.  [place] repeat CT  imaging for surveillance

## 2021-12-20 NOTE — Progress Notes (Signed)
Pt here for follow up. No new concerns voiced.   

## 2021-12-20 NOTE — Progress Notes (Signed)
Hematology/Oncology Progress note [phone] [phone]         Patient Care Team: Tracie Harrier, MD as PCP - General (Internal Medicine) Cammie Sickle, MD as Consulting Physician (Oncology)   CHIEF COMPLAINTS/REASON FOR VISIT:  Follow up for endometrial cancer  ASSESSMENT & PLAN:   Cancer Staging  Endometrial adenocarcinoma [facility]) Staging form: Corpus Uteri - Carcinoma and Carcinosarcoma, AJCC 8th Edition - Clinical stage from 04/10/2021: FIGO Stage III, calculated as Stage Unknown (cT3, cNX, cM0) - Signed by Earlie Server, MD on 08/24/2021   Endometrial adenocarcinoma ([place]) s/p carboplatin and Taxol for 3 cycles, PET scan showed complete response.  Imaging was reviewed and discussed with patient.  No residual hypermetabolism.  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  Labs are reviewed and discussed with patient. Proceed with maintenance.  [place] repeat CT  imaging for surveillance    Morbid obesity with body mass index (BMI) of 40.0 to 44.9 in adult [facility]) discussed about lifestyle modification, healthy diet and exercise.    Anemia due to antineoplastic chemotherapy She is currently on immunotherapy.  Hemoglobin improved.   Encounter for antineoplastic immunotherapy Treatment plan as listed above.    Orders Placed This Encounter  Procedures   CT CHEST ABDOMEN PELVIS W CONTRAST    Standing Status:   Future    Standing Expiration Date:   12/21/2022    Order Specific Question:   Preferred imaging location?    Answer:   [facility]    Order Specific Question:   Is Oral Contrast requested for this exam?    Answer:   Yes, Per Radiology protocol     Return of visit:  3 weeks Lab MD Octaviano Batty, MD, [name] [facility] Hematology Oncology 12/20/2021     HISTORY OF PRESENTING ILLNESS:   Valerie Case is a  71 y.o.  female presents for follow up of  FIGO  grade 3 poorly differentiated endometrial adenocarcinoma. Oncology History  Endometrial adenocarcinoma ([place])  12/12/2020 Imaging   12/12/2020, CT abdomen pelvis with contrast showed no radiographic evidence of urinary tract neoplasm, calculi, hydronephrosis.  2.6 cm nonspecific left adrenal mass. 02/12/2021 CT hematuria work-up showed small uterine fibroids, no findings to explain hematuria.Hepatomegaly, diverticulosis without evidence of diverticulitis, Coronary artery disease   04/01/2021 -  Hospital Admission   04/01/2021 - 04/03/2021, patient was hospitalized due to symptomatic anemia, hemoglobin 6.9, heavy postmenopausal bleeding with passing large clots.  She also presented with increased creatinine level to 2.58 compared to her baseline level of 0.9 in November 2022.  Patient received IV iron infusion and 2 units of PRBC during the hospital stay..  04/02/2021, iron panel showed iron saturation 37, ferritin 37, TIBC 333-the studies were done after patient received blood transfusion.  At discharge, hemoglobin was 7.7.   04/01/2021 Initial Diagnosis   04/01/2020, endometrial biopsy showed poorly differentiated endometrial adenocarcinoma. Omniseq NGS showed TMB 48.4 mt/mb [high], MSI High, PD-L1-TPS <1%, PIK3CA M3559R, Negative for BRAF, HER2, NTRK1 RET.     04/10/2021 Cancer Staging   Staging form: Corpus Uteri - Carcinoma and Carcinosarcoma, AJCC 8th Edition - Clinical stage from 04/10/2021: FIGO Stage III, calculated as Stage Unknown (cT3, cNX, cM0) - Signed by Earlie Server, MD on 08/24/2021 Stage prefix: Initial diagnosis   04/15/2021 Imaging   PET showed 1. Large hypermetabolic endometrial mass consistent with known endometrial cancer. Suspect direct invasion/involvement of the right adnexa. Right pelvic sidewall  hypermetabolic adenopathy.2. 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 3. No findings for  metastatic disease involving the chest or bonystructures.     04/24/2021 - 05/29/2021 Radiation Therapy   status post radiation to pelvis   08/05/2021 Imaging   MRI abdomen w wo contrast 1. Stable solid enhancing 2.7 cm left adrenal lesion, which was hypermetabolic on prior PET/CTs but is unchanged in size dating back to December 12, 2020. 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 However, unfortunately metastatic disease can not be entirely excluded and remains a pertinent differential consideration. Comparison with more remote prior imaging would be the most valuable tool in the assessment of this lesion, as demonstrating long-term stability would indicate this to be a benign lesion. However, if no prior imaging can be made available, would consider follow-up adrenal protocol CT with and without intravenous contrast material in 3 months as this would allow for further assessment of stability as well as enhancement and washout characteristics of the lesion potentially allowing for better characterization versus direct tissue sampling.2. Hepatomegaly and hepatic steatosis.3. Colonic diverticulosis without findings of acute diverticulitis    08/16/2021 - 09/27/2021 Chemotherapy   UTERINE Carboplatin AUC 5 / Paclitaxel q21d x 3      10/03/2021 Imaging   PET 1. No residual hypermetabolism in the uterus suggesting an excellent response to treatment. No findings for metastatic disease. 2. Resolution of hypermetabolism in the left adrenal gland lesion suggesting this was metastatic disease. 3. Diffuse marrow hypermetabolism likely due to rebound from chemotherapy or marrow stimulating drugs   10/18/2021 - 11/08/2021 Chemotherapy   AUC 5 / Paclitaxel/ Keytruda Q21d  x 2 cycles   11/29/2021 -  Chemotherapy   Patient is on Treatment Plan :  UTERINE Pembrolizumab (200) q21d         INTERVAL HISTORY Valerie Case is a 71 y.o. female who has above history reviewed by me today presents for follow up visit for anemia and endometrial cancer Overall she tolerates treatment, Improved energy level. + skin itchy intermittently, she took a dose of benadryl with improvement.   Review of Systems  Constitutional:  Positive for fatigue. Negative for chills and fever.  HENT:   Negative for hearing loss and voice change.   Eyes:  Negative for eye problems.  Respiratory:  Negative for chest tightness and cough.   Cardiovascular:  Negative for chest pain.  Gastrointestinal:  Negative for abdominal distention, abdominal pain and blood in stool.  Endocrine: Negative for hot flashes.  Genitourinary:  Negative for difficulty urinating, frequency and vaginal bleeding.   Musculoskeletal:  Positive for arthralgias.  Skin:  Positive for itching. Negative for rash.  Neurological:  Negative for extremity weakness.  Hematological:  Negative for adenopathy.  Psychiatric/Behavioral:  Negative for confusion.     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    SURGICAL HISTORY: Past Surgical History:  Procedure Laterality Date   CESAREAN SECTION  01/23/1979   IR IMAGING GUIDED PORT INSERTION  08/09/2021     TONSILLECTOMY  03/17/1956    SOCIAL HISTORY: Social History   Socioeconomic History   Marital status: Divorced    Spouse name: Not on file   Number of children: Not on file   Years of education: Not on file   Highest education level: Not on file  Occupational History   Not on file  Tobacco Use   Smoking status: Never   Smokeless tobacco: Never  Vaping Use    Vaping Use: Never used  Substance and Sexual Activity   Alcohol use: Not Currently   Drug use: Never   Sexual activity: Not Currently  Other Topics Concern   Not on file  Social History Narrative      Social Determinants of Health   Financial Resource Strain: Low Risk  (09/27/2021)   Overall Financial Resource Strain (CARDIA)    Difficulty of Paying Living Expenses: Not very hard  Food Insecurity: No Food Insecurity (09/27/2021)   Hunger Vital Sign    Worried About Running Out of Food in the Last Year: Never true    Ran Out of Food in the Last Year: Never true  Transportation Needs: No Transportation Needs (09/27/2021)   PRAPARE - Hydrologist (Medical): No    Lack of Transportation (Non-Medical): No  Physical Activity: Inactive (09/27/2021)   Exercise Vital Sign    Days of Exercise per Week: 0 days    Minutes of Exercise per Session: 0 min  Stress: Stress Concern Present (09/27/2021)   [place]    Feeling of Stress : Rather much  Social Connections: Moderately Integrated (09/27/2021)   Social Connection and Isolation Panel [NHANES]    Frequency of Communication with Friends and Family: Three times a week    Frequency of Social Gatherings with Friends and Family: Three times a week    Attends Religious Services: 1 to 4 times per year    Active Member of Clubs or Organizations: No    Attends Archivist Meetings: 1 to 4 times per year    Marital Status: Divorced  Intimate Partner Violence: Not At Risk (09/27/2021)   Humiliation, Afraid, Rape, and Kick questionnaire    Fear of Current or Ex-Partner: No    Emotionally Abused: No    Physically Abused: No    Sexually Abused: No    FAMILY HISTORY: Family History  Problem Relation Age of Onset   Breast cancer Mother 81   Cancer Mother    Cancer Father    Lung cancer Father    Breast cancer Cousin        dx 75s maternal     ALLERGIES:  is allergic to aspirin.  MEDICATIONS:  Current Outpatient Medications  Medication Sig Dispense Refill   acetaminophen (TYLENOL) 650 MG CR tablet Take 1,300 mg by mouth every 8 (eight) hours as needed for pain.     albuterol (VENTOLIN HFA) 108 (90 Base) MCG/ACT inhaler Inhale 2 puffs into the lungs every 6 (six) hours as needed for wheezing or shortness of breath.     Cranberry-Vitamin C-Probiotic (AZO CRANBERRY PO) Take by mouth.     docusate sodium (COLACE) 100 MG capsule Take 1 capsule (100 mg total) by mouth 2 (two) times daily. 60 capsule 1   empagliflozin (JARDIANCE) 25 MG TABS tablet Take 25 mg by mouth daily.     ferrous sulfate 325 (65 FE) MG EC tablet Take 325 mg by mouth daily with breakfast.  FLUoxetine (PROZAC) 10 MG capsule Take 10 mg by mouth daily.     fluticasone (FLONASE) 50 MCG/ACT nasal spray Place 1 spray into both nostrils daily as needed for allergies or rhinitis.     hydrochlorothiazide (HYDRODIURIL) 12.5 MG tablet Take 12.5 mg by mouth daily.     hydrocortisone 2.5 % cream Apply topically 2 (two) times daily. 30 g 2   ibuprofen (ADVIL) 200 MG tablet Take 400 mg by mouth every 8 (eight) hours as needed for moderate pain.     lidocaine-prilocaine (EMLA) cream Apply to affected area once 30 g 3   lisinopril (ZESTRIL) 40 MG tablet Take 40 mg by mouth at bedtime.     LORazepam (ATIVAN) 0.5 MG tablet Take 1 tablet (0.5 mg total) by mouth every 8 (eight) hours as needed for anxiety (nausea vomiting). 30 tablet 0   metFORMIN (GLUCOPHAGE-XR) 500 MG 24 hr tablet Take 1,000 mg by mouth at bedtime.     metoprolol succinate (TOPROL-XL) 25 MG 24 hr tablet Take 25 mg by mouth daily.     NOVOLIN 70/30 FLEXPEN (70-30) 100 UNIT/ML KwikPen 50-110 Units See admin instructions. 50 units in the morning, 110 units at bedtime     ondansetron (ZOFRAN) 8 MG tablet Take 1 tablet (8 mg total) by mouth 2 (two) times daily as needed. Start on the third day after chemotherapy. 30  tablet 1   OZEMPIC, 1 MG/DOSE, 4 MG/3ML SOPN Inject 1 mg into the skin every Tuesday.     potassium chloride SA (KLOR-CON M) 20 MEQ tablet Take 1 tablet (20 mEq total) by mouth daily. 3 tablet 0   prochlorperazine (COMPAZINE) 10 MG tablet Take 1 tablet (10 mg total) by mouth every 6 (six) hours as needed (Nausea or vomiting). 30 tablet 1   rosuvastatin (CRESTOR) 10 MG tablet Take 10 mg by mouth daily.     traMADol (ULTRAM) 50 MG tablet Take 2 tablets (100 mg total) by mouth every 6 (six) hours as needed. 90 tablet 0   No current facility-administered medications for this visit.   Facility-Administered Medications Ordered in Other Visits  Medication Dose Route Frequency Provider Last Rate Last Admin   0.9 %  sodium chloride infusion   Intravenous Once Earlie Server, MD       heparin lock flush 100 unit/mL  500 Units Intracatheter Once PRN Earlie Server, MD       pembrolizumab [facility]) 200 mg in sodium chloride 0.9 % 50 mL chemo infusion  200 mg Intravenous Once Earlie Server, MD         PHYSICAL EXAMINATION: ECOG PERFORMANCE STATUS: 1 - Symptomatic but completely ambulatory  Physical Exam Constitutional:      General: She is not in acute distress.    Appearance: She is obese.  HENT:     Head: Normocephalic and atraumatic.  Eyes:     General: No scleral icterus. Cardiovascular:     Rate and Rhythm: Normal rate and regular rhythm.     Heart sounds: Normal heart sounds.  Pulmonary:     Effort: Pulmonary effort is normal. No respiratory distress.     Breath sounds: No wheezing.  Abdominal:     General: Bowel sounds are normal. There is no distension.     Palpations: Abdomen is soft.  Musculoskeletal:        General: No deformity. Normal range of motion.     Cervical back: Normal range of motion and neck supple.     Right lower leg: Edema  present.     Left lower leg: Edema present.     Comments:    Skin:    General: Skin is warm and dry.     Findings: No erythema or rash.     Comments:  Chronic venous sufficiency skin changes  Neurological:     Mental Status: She is alert and oriented to person, place, and time. Mental status is at baseline.     Cranial Nerves: No cranial nerve deficit.     Coordination: Coordination normal.  Psychiatric:        Mood and Affect: Mood normal.      LABORATORY DATA:  I have reviewed the data as listed    Latest Ref Rng & Units 12/20/2021    8:04 AM 11/29/2021    8:42 AM 11/19/2021   10:41 AM  CBC  WBC 4.0 - 10.5 K/uL 7.1  4.1  8.8   Hemoglobin 12.0 - 15.0 g/dL 10.8  8.4  8.6   Hematocrit 36.0 - 46.0 % 33.7  26.3  26.7   Platelets 150 - 400 K/uL 279  124  120       Latest Ref Rng & Units 12/20/2021    8:04 AM 11/29/2021    8:42 AM 11/08/2021    7:57 AM  CMP  Glucose 70 - 99 mg/dL 134  161  158   BUN 8 - 23 mg/dL _0 Creatinine 0.44 - 1.00 mg/dL 0.92  0.88  0.77   Sodium 135 - 145 mmol/L 139  139  138   Potassium 3.5 - 5.1 mmol/L 3.4  3.6  3.9   Chloride 98 - 111 mmol/L 107  104  106   CO2 22 - 32 mmol/L _1 Calcium 8.9 - 10.3 mg/dL 9.3  9.2  9.5   Total Protein 6.5 - 8.1 g/dL 7.4  6.8  7.0   Total Bilirubin 0.3 - 1.2 mg/dL 0.4  0.5  0.3   Alkaline Phos 38 - 126 U/L 66  63  58   AST 15 - 41 U/L _2 ALT 0 - 44 U/L _3 RADIOGRAPHIC STUDIES: I have personally reviewed the radiological images as listed and agreed with the findings in the report. NM PET Image Restag (PS) Skull Base To Thigh  Result Date: 10/04/2021 CLINICAL DATA:  Subsequent treatment strategy for endometrial carcinoma. EXAM: NUCLEAR MEDICINE PET SKULL BASE TO THIGH TECHNIQUE: 14.57 mCi F-18 FDG was injected intravenously. Full-ring PET imaging was performed from the skull base to thigh after the radiotracer. CT data was obtained and used for attenuation correction and anatomic localization. Fasting blood glucose: 151 mg/dl COMPARISON:  Prior PET CTs from 04/15/2021 and 06/10/2021. Recent MRI abdomen 08/05/2021 FINDINGS:  Mediastinal blood pool activity: SUV max 1.32 Liver activity: SUV max NA NECK: No hypermetabolic lymph nodes in the neck. Incidental CT findings: Right IJ catheter in good position. CHEST: No hypermetabolic mediastinal or hilar nodes. No suspicious pulmonary nodules on the CT scan. No hypermetabolic breast masses, supraclavicular or axillary adenopathy. Incidental CT findings: Stable cardiac enlargement. Stable atherosclerotic calcifications involving the aorta and coronary arteries. ABDOMEN/PELVIS: I do not see any residual hypermetabolism involving the uterus suggesting an excellent response to treatment. No enlarged or hypermetabolic pelvic lymph nodes. No evidence of omental or peritoneal surface disease. No evidence of metastatic disease involving the liver or adrenal glands. There  is a stable 2.5 cm left adrenal gland lesion. This was hypermetabolic on the prior PET-CT with SUV max of 6.87 but this has completely resolved suggesting it is treated metastatic disease. Incidental CT findings: Stable large left-sided abdominal wall hernia. Stable descending and sigmoid colon diverticulosis. SKELETON: Diffuse hypermetabolic marrow suggesting rebound from chemotherapy or marrow stimulating drugs. Incidental CT findings: none IMPRESSION: 1. No residual hypermetabolism in the uterus suggesting an excellent response to treatment. No findings for metastatic disease. 2. Resolution of hypermetabolism in the left adrenal gland lesion suggesting this was metastatic disease. 3. Diffuse marrow hypermetabolism likely due to rebound from chemotherapy or marrow stimulating drugs. Electronically Signed   By: Marijo Sanes M.D.   On: 10/04/2021 11:08

## 2022-01-10 ENCOUNTER — Encounter: Payer: Self-pay | Admitting: Oncology

## 2022-01-10 ENCOUNTER — Inpatient Hospital Stay: Payer: Medicare PPO

## 2022-01-10 ENCOUNTER — Inpatient Hospital Stay (HOSPITAL_BASED_OUTPATIENT_CLINIC_OR_DEPARTMENT_OTHER): Payer: Medicare PPO | Admitting: Oncology

## 2022-01-10 VITALS — BP 112/59 | HR 91 | Temp 97.9°F | Resp 18 | Wt 274.4 lb

## 2022-01-10 DIAGNOSIS — D6481 Anemia due to antineoplastic chemotherapy: Secondary | ICD-10-CM | POA: Diagnosis not present

## 2022-01-10 DIAGNOSIS — E278 Other specified disorders of adrenal gland: Secondary | ICD-10-CM

## 2022-01-10 DIAGNOSIS — Z5112 Encounter for antineoplastic immunotherapy: Secondary | ICD-10-CM

## 2022-01-10 DIAGNOSIS — T451X5A Adverse effect of antineoplastic and immunosuppressive drugs, initial encounter: Secondary | ICD-10-CM

## 2022-01-10 DIAGNOSIS — C541 Malignant neoplasm of endometrium: Secondary | ICD-10-CM

## 2022-01-10 DIAGNOSIS — Z6841 Body Mass Index (BMI) 40.0 and over, adult: Secondary | ICD-10-CM

## 2022-01-10 LAB — CBC WITH DIFFERENTIAL/PLATELET
Abs Immature Granulocytes: 0.03 10*3/uL (ref 0.00–0.07)
Basophils Absolute: 0.1 10*3/uL (ref 0.0–0.1)
Basophils Relative: 1 %
Eosinophils Absolute: 1.9 10*3/uL — ABNORMAL HIGH (ref 0.0–0.5)
Eosinophils Relative: 23 %
HCT: 34 % — ABNORMAL LOW (ref 36.0–46.0)
Hemoglobin: 10.8 g/dL — ABNORMAL LOW (ref 12.0–15.0)
Immature Granulocytes: 0 %
Lymphocytes Relative: 14 %
Lymphs Abs: 1.2 10*3/uL (ref 0.7–4.0)
MCH: 32.6 pg (ref 26.0–34.0)
MCHC: 31.8 g/dL (ref 30.0–36.0)
MCV: 102.7 fL — ABNORMAL HIGH (ref 80.0–100.0)
Monocytes Absolute: 0.8 10*3/uL (ref 0.1–1.0)
Monocytes Relative: 9 %
Neutro Abs: 4.4 10*3/uL (ref 1.7–7.7)
Neutrophils Relative %: 53 %
Platelets: 251 10*3/uL (ref 150–400)
RBC: 3.31 MIL/uL — ABNORMAL LOW (ref 3.87–5.11)
RDW: 14.1 % (ref 11.5–15.5)
WBC: 8.4 10*3/uL (ref 4.0–10.5)
nRBC: 0 % (ref 0.0–0.2)

## 2022-01-10 LAB — COMPREHENSIVE METABOLIC PANEL
ALT: 12 U/L (ref 0–44)
AST: 22 U/L (ref 15–41)
Albumin: 3.7 g/dL (ref 3.5–5.0)
Alkaline Phosphatase: 63 U/L (ref 38–126)
Anion gap: 9 (ref 5–15)
BUN: 33 mg/dL — ABNORMAL HIGH (ref 8–23)
CO2: 21 mmol/L — ABNORMAL LOW (ref 22–32)
Calcium: 9.7 mg/dL (ref 8.9–10.3)
Chloride: 109 mmol/L (ref 98–111)
Creatinine, Ser: 1.01 mg/dL — ABNORMAL HIGH (ref 0.44–1.00)
GFR, Estimated: 60 mL/min — ABNORMAL LOW (ref >60)
Glucose, Bld: 179 mg/dL — ABNORMAL HIGH (ref 70–99)
Potassium: 3.9 mmol/L (ref 3.5–5.1)
Sodium: 139 mmol/L (ref 135–145)
Total Bilirubin: 0.1 mg/dL — ABNORMAL LOW (ref 0.3–1.2)
Total Protein: 7.1 g/dL (ref 6.5–8.1)

## 2022-01-10 LAB — TSH: TSH: 1.992 u[IU]/mL (ref 0.350–4.500)

## 2022-01-10 MED ORDER — SODIUM CHLORIDE 0.9 % IV SOLN
200.0000 mg | Freq: Once | INTRAVENOUS | Status: AC
  Administered 2022-01-10: 200 mg via INTRAVENOUS
  Filled 2022-01-10: qty 8

## 2022-01-10 MED ORDER — HEPARIN SOD (PORK) LOCK FLUSH 100 UNIT/ML IV SOLN
INTRAVENOUS | Status: AC
  Filled 2022-01-10: qty 5

## 2022-01-10 MED ORDER — SODIUM CHLORIDE 0.9 % IV SOLN
Freq: Once | INTRAVENOUS | Status: AC
  Filled 2022-01-10: qty 250

## 2022-01-10 MED ORDER — HEPARIN SOD (PORK) LOCK FLUSH 100 UNIT/ML IV SOLN
500.0000 [IU] | Freq: Once | INTRAVENOUS | Status: AC | PRN
  Administered 2022-01-10: 500 [IU]
  Filled 2022-01-10: qty 5

## 2022-01-10 NOTE — Patient Instructions (Signed)
[facility]  Discharge Instructions: Thank you for choosing [facility] to provide your oncology and hematology care.  If you have a lab appointment with the Cancer Center, please go directly to the Cancer Center and check in at the registration area.  Wear comfortable clothing and clothing appropriate for easy access to any Portacath or PICC line.   We strive to give you quality time with your provider. You may need to reschedule your appointment if you arrive late (15 or more minutes).  Arriving late affects you and other patients whose appointments are after yours.  Also, if you miss three or more appointments without notifying the office, you may be dismissed from the clinic at the provider's discretion.      For prescription refill requests, have your pharmacy contact our office and allow 72 hours for refills to be completed.    Today you received the following chemotherapy and/or immunotherapy agents keytruda    To help prevent nausea and vomiting after your treatment, we encourage you to take your nausea medication as directed.  BELOW ARE SYMPTOMS THAT SHOULD BE REPORTED IMMEDIATELY: *FEVER GREATER THAN 100.4 F (38 C) OR HIGHER *CHILLS OR SWEATING *NAUSEA AND VOMITING THAT IS NOT CONTROLLED WITH YOUR NAUSEA MEDICATION *UNUSUAL SHORTNESS OF BREATH *UNUSUAL BRUISING OR BLEEDING *URINARY PROBLEMS (pain or burning when urinating, or frequent urination) *BOWEL PROBLEMS (unusual diarrhea, constipation, pain near the anus) TENDERNESS IN MOUTH AND THROAT WITH OR WITHOUT PRESENCE OF ULCERS (sore throat, sores in mouth, or a toothache) UNUSUAL RASH, SWELLING OR PAIN  UNUSUAL VAGINAL DISCHARGE OR ITCHING   Items with * indicate a potential emergency and should be followed up as soon as possible or go to the Emergency Department if any problems should occur.  Please show the CHEMOTHERAPY ALERT CARD or IMMUNOTHERAPY ALERT CARD at check-in to the  Emergency Department and triage nurse.  Should you have questions after your visit or need to cancel or reschedule your appointment, please contact [facility]  [phone] and follow the prompts.  Office hours are 8:00 a.m. to 4:30 p.m. Monday - Friday. Please note that voicemails left after 4:00 p.m. may not be returned until the following business day.  We are closed weekends and major holidays. You have access to a nurse at all times for urgent questions. Please call the main number to the clinic [phone] and follow the prompts.  For any non-urgent questions, you may also contact your provider using MyChart. We now offer e-Visits for anyone 18 and older to request care online for non-urgent symptoms. For details visit [email or web address].   Also download the MyChart app! Go to the app store, search "MyChart", open the app, select [facility], and log in with your MyChart username and password.  Masks are optional in the cancer centers. If you would like for your care team to wear a mask while they are taking care of you, please let them know. For doctor visits, patients may have with them one support person who is at least 70 years old. At this time, visitors are not allowed in the infusion area.   

## 2022-01-10 NOTE — Assessment & Plan Note (Signed)
lipid poor hyperfunctioning adenoma vs mets.  FDG avid on PET scan.  Difficult biopsy due to patient's body habitus and deep position of the mass. Resolved on current PET scan, suggesting that this lesion might be a metastatic lesion. Continue CT surveillance.

## 2022-01-10 NOTE — Progress Notes (Signed)
Hematology/Oncology Progress note [phone] [phone]         Patient Care Team: Tracie Harrier, MD as PCP - General (Internal Medicine)   CHIEF COMPLAINTS/REASON FOR VISIT:  Follow up for endometrial cancer  ASSESSMENT & PLAN:   Cancer Staging  Endometrial adenocarcinoma [facility]) Staging form: Corpus Uteri - Carcinoma and Carcinosarcoma, AJCC 8th Edition - Clinical stage from 04/10/2021: FIGO Stage III, calculated as Stage Unknown (cT3, cNX, cM0) - Signed by Earlie Server, MD on 08/24/2021   Endometrial adenocarcinoma ([place]) s/p carboplatin and Taxol for 5 cycles, on Keytruda maintenance.  Labs are reviewed and discussed with patient. Proceed with maintenance.  Keytruda  Obtain CT for surveillance. Creatinine is slightly elevated. Encourage oral hydration.   Anemia due to antineoplastic chemotherapy She is currently on immunotherapy.  Hemoglobin improved.   Encounter for antineoplastic immunotherapy Treatment plan as listed above.   Morbid obesity with body mass index (BMI) of 40.0 to 44.9 in adult [facility] [place]) discussed about lifestyle modification, healthy diet and exercise.    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  FDG avid on PET scan.  Difficult biopsy due to patient's body habitus and deep position of the mass. Resolved on current PET scan, suggesting that this lesion might be a metastatic lesion. Continue CT surveillance.   Orders Placed This Encounter  Procedures   CBC with Differential    Standing Status:   Future    Standing Expiration Date:   03/15/2023   Comprehensive metabolic panel    Standing Status:   Future    Standing Expiration Date:   03/15/2023   TSH    Standing Status:   Future    Standing Expiration Date:   03/15/2023     Return of visit:  3 weeks Lab MD Octaviano Batty, MD, [name] [facility] Hematology Oncology 01/10/2022     HISTORY OF PRESENTING ILLNESS:   Valerie Case is a   71 y.o.  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 Oncology History  Endometrial adenocarcinoma ([place])  12/12/2020 Imaging   12/12/2020, CT abdomen pelvis with contrast showed no radiographic evidence of urinary tract neoplasm, calculi, hydronephrosis.  2.6 cm nonspecific left adrenal mass. 02/12/2021 CT hematuria work-up showed small uterine fibroids, no findings to explain hematuria.Hepatomegaly, diverticulosis without evidence of diverticulitis, Coronary artery disease   04/01/2021 -  Hospital Admission   04/01/2021 - 04/03/2021, patient was hospitalized due to symptomatic anemia, hemoglobin 6.9, heavy postmenopausal bleeding with passing large clots.  She also presented with increased creatinine level to 2.58 compared to her baseline level of 0.9 in November 2022.  Patient received IV iron infusion and 2 units of PRBC during the hospital stay..  04/02/2021, iron panel showed iron saturation 37, ferritin 37, TIBC 333-the studies were done after patient received blood transfusion.  At discharge, hemoglobin was 7.7.   04/01/2021 Initial Diagnosis   04/01/2020, endometrial biopsy showed poorly differentiated endometrial adenocarcinoma. Omniseq NGS showed TMB 48.4 mt/mb [high], MSI High, PD-L1-TPS <1%, PIK3CA T9030S, Negative for BRAF, HER2, NTRK1 RET.     04/10/2021 Cancer Staging   Staging form: Corpus Uteri - Carcinoma and Carcinosarcoma, AJCC 8th Edition - Clinical stage from 04/10/2021: FIGO Stage III, calculated as Stage Unknown (cT3, cNX, cM0) - Signed by Earlie Server, MD on 08/24/2021 Stage prefix: Initial diagnosis   04/15/2021 Imaging   PET showed 1. Large hypermetabolic endometrial mass consistent with known endometrial cancer. Suspect direct  invasion/involvement of the right adnexa. Right pelvic sidewall hypermetabolic adenopathy.2. Enlarged and hypermetabolic left adrenal gland lesion could reflect a lipid poor hyperfunctioning adenoma but  metastasis is also possible. 3. No findings for metastatic disease involving the chest or bonystructures.     04/24/2021 - 05/29/2021 Radiation Therapy   status post radiation to pelvis   08/05/2021 Imaging   MRI abdomen w wo contrast 1. Stable solid enhancing 2.7 cm left adrenal lesion, which was hypermetabolic on prior PET/CTs but is unchanged in size dating back to December 12, 2020. 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 However, unfortunately metastatic disease can not be entirely excluded and remains a pertinent differential consideration. Comparison with more remote prior imaging would be the most valuable tool in the assessment of this lesion, as demonstrating long-term stability would indicate this to be a benign lesion. However, if no prior imaging can be made available, would consider follow-up adrenal protocol CT with and without intravenous contrast material in 3 months as this would allow for further assessment of stability as well as enhancement and washout characteristics of the lesion potentially allowing for better characterization versus direct tissue sampling.2. Hepatomegaly and hepatic steatosis.3. Colonic diverticulosis without findings of acute diverticulitis    08/16/2021 - 09/27/2021 Chemotherapy   UTERINE Carboplatin AUC 5 / Paclitaxel q21d x 3      10/03/2021 Imaging   PET 1. No residual hypermetabolism in the uterus suggesting an excellent response to treatment. No findings for metastatic disease. 2. Resolution of hypermetabolism in the left adrenal gland lesion suggesting this was metastatic disease. 3. Diffuse marrow hypermetabolism likely due to rebound from chemotherapy or marrow stimulating drugs   10/18/2021 - 11/08/2021 Chemotherapy   AUC 5 / Paclitaxel/ Keytruda Q21d  x 2 cycles   11/29/2021 -   Chemotherapy   Patient is on Treatment Plan : UTERINE Pembrolizumab (200) q21d         INTERVAL HISTORY Valerie Case is a 71 y.o. female who has above history reviewed by me today presents for follow up visit for anemia and endometrial cancer Overall she tolerates treatment, Improved energy level. + skin itchy intermittently, she took a dose of benadryl with improvement.   Review of Systems  Constitutional:  Positive for fatigue. Negative for chills and fever.  HENT:   Negative for hearing loss and voice change.   Eyes:  Negative for eye problems.  Respiratory:  Negative for chest tightness and cough.   Cardiovascular:  Negative for chest pain.  Gastrointestinal:  Negative for abdominal distention, abdominal pain and blood in stool.  Endocrine: Negative for hot flashes.  Genitourinary:  Negative for difficulty urinating, frequency and vaginal bleeding.   Musculoskeletal:  Positive for arthralgias.  Skin:  Positive for itching. Negative for rash.  Neurological:  Negative for extremity weakness.  Hematological:  Negative for adenopathy.  Psychiatric/Behavioral:  Negative for confusion.     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    SURGICAL HISTORY: Past Surgical History:  Procedure Laterality Date   CESAREAN SECTION  01/23/1979  IR IMAGING GUIDED PORT INSERTION  08/09/2021   TONSILLECTOMY  03/17/1956    SOCIAL HISTORY: Social History   Socioeconomic History   Marital status: Divorced    Spouse name: Not on file   Number of children: Not on file   Years of education: Not on file   Highest education level: Not on file  Occupational History   Not on file  Tobacco Use   Smoking status:  Never   Smokeless tobacco: Never  Vaping Use   Vaping Use: Never used  Substance and Sexual Activity   Alcohol use: Not Currently   Drug use: Never   Sexual activity: Not Currently  Other Topics Concern   Not on file  Social History Narrative      Social Determinants of Health   Financial Resource Strain: Low Risk  (09/27/2021)   Overall Financial Resource Strain (CARDIA)    Difficulty of Paying Living Expenses: Not very hard  Food Insecurity: No Food Insecurity (09/27/2021)   Hunger Vital Sign    Worried About Running Out of Food in the Last Year: Never true    Ran Out of Food in the Last Year: Never true  Transportation Needs: No Transportation Needs (09/27/2021)   PRAPARE - Hydrologist (Medical): No    Lack of Transportation (Non-Medical): No  Physical Activity: Inactive (09/27/2021)   Exercise Vital Sign    Days of Exercise per Week: 0 days    Minutes of Exercise per Session: 0 min  Stress: Stress Concern Present (09/27/2021)   [place]    Feeling of Stress : Rather much  Social Connections: Moderately Integrated (09/27/2021)   Social Connection and Isolation Panel [NHANES]    Frequency of Communication with Friends and Family: Three times a week    Frequency of Social Gatherings with Friends and Family: Three times a week    Attends Religious Services: 1 to 4 times per year    Active Member of Clubs or Organizations: No    Attends Archivist Meetings: 1 to 4 times per year    Marital Status: Divorced  Intimate Partner Violence: Not At Risk (09/27/2021)   Humiliation, Afraid, Rape, and Kick questionnaire    Fear of Current or Ex-Partner: No    Emotionally Abused: No    Physically Abused: No    Sexually Abused: No    FAMILY HISTORY: Family History  Problem Relation Age of Onset   Breast cancer Mother 14   Cancer Mother    Cancer Father    Lung cancer Father     Breast cancer Cousin        dx 44s maternal    ALLERGIES:  is allergic to aspirin.  MEDICATIONS:  Current Outpatient Medications  Medication Sig Dispense Refill   acetaminophen (TYLENOL) 650 MG CR tablet Take 1,300 mg by mouth every 8 (eight) hours as needed for pain.     albuterol (VENTOLIN HFA) 108 (90 Base) MCG/ACT inhaler Inhale 2 puffs into the lungs every 6 (six) hours as needed for wheezing or shortness of breath.     Cranberry-Vitamin C-Probiotic (AZO CRANBERRY PO) Take by mouth.     docusate sodium (COLACE) 100 MG capsule Take 1 capsule (100 mg total) by mouth 2 (two) times daily. 60 capsule 1   empagliflozin (JARDIANCE) 25 MG TABS tablet Take 25 mg by mouth daily.     ferrous sulfate 325 (65 FE) MG EC tablet Take  325 mg by mouth daily with breakfast.     FLUoxetine (PROZAC) 10 MG capsule Take 10 mg by mouth daily.     fluticasone (FLONASE) 50 MCG/ACT nasal spray Place 1 spray into both nostrils daily as needed for allergies or rhinitis.     hydrochlorothiazide (HYDRODIURIL) 12.5 MG tablet Take 12.5 mg by mouth daily.     hydrocortisone 2.5 % cream Apply topically 2 (two) times daily. 30 g 2   ibuprofen (ADVIL) 200 MG tablet Take 400 mg by mouth every 8 (eight) hours as needed for moderate pain.     lidocaine-prilocaine (EMLA) cream Apply to affected area once 30 g 3   lisinopril (ZESTRIL) 40 MG tablet Take 40 mg by mouth at bedtime.     LORazepam (ATIVAN) 0.5 MG tablet Take 1 tablet (0.5 mg total) by mouth every 8 (eight) hours as needed for anxiety (nausea vomiting). 30 tablet 0   metFORMIN (GLUCOPHAGE-XR) 500 MG 24 hr tablet Take 1,000 mg by mouth at bedtime.     metoprolol succinate (TOPROL-XL) 25 MG 24 hr tablet Take 25 mg by mouth daily.     NOVOLIN 70/30 FLEXPEN (70-30) 100 UNIT/ML KwikPen 50-110 Units See admin instructions. 50 units in the morning, 110 units at bedtime     ondansetron (ZOFRAN) 8 MG tablet Take 1 tablet (8 mg total) by mouth 2 (two) times daily as needed.  Start on the third day after chemotherapy. 30 tablet 1   OZEMPIC, 1 MG/DOSE, 4 MG/3ML SOPN Inject 1 mg into the skin every Tuesday.     potassium chloride SA (KLOR-CON M) 20 MEQ tablet Take 1 tablet (20 mEq total) by mouth daily. 3 tablet 0   prochlorperazine (COMPAZINE) 10 MG tablet Take 1 tablet (10 mg total) by mouth every 6 (six) hours as needed (Nausea or vomiting). 30 tablet 1   rosuvastatin (CRESTOR) 10 MG tablet Take 10 mg by mouth daily.     traMADol (ULTRAM) 50 MG tablet Take 2 tablets (100 mg total) by mouth every 6 (six) hours as needed. 90 tablet 0   No current facility-administered medications for this visit.   Facility-Administered Medications Ordered in Other Visits  Medication Dose Route Frequency Provider Last Rate Last Admin   heparin lock flush 100 UNIT/ML injection              PHYSICAL EXAMINATION: ECOG PERFORMANCE STATUS: 1 - Symptomatic but completely ambulatory  Physical Exam Constitutional:      General: She is not in acute distress.    Appearance: She is obese.  HENT:     Head: Normocephalic and atraumatic.  Eyes:     General: No scleral icterus. Cardiovascular:     Rate and Rhythm: Normal rate and regular rhythm.     Heart sounds: Normal heart sounds.  Pulmonary:     Effort: Pulmonary effort is normal. No respiratory distress.     Breath sounds: No wheezing.  Abdominal:     General: Bowel sounds are normal. There is no distension.     Palpations: Abdomen is soft.  Musculoskeletal:        General: No deformity. Normal range of motion.     Cervical back: Normal range of motion and neck supple.     Right lower leg: Edema present.     Left lower leg: Edema present.     Comments:    Skin:    General: Skin is warm and dry.     Findings: No erythema or rash.  Comments: Chronic venous sufficiency skin changes  Neurological:     Mental Status: She is alert and oriented to person, place, and time. Mental status is at baseline.     Cranial Nerves:  No cranial nerve deficit.     Coordination: Coordination normal.  Psychiatric:        Mood and Affect: Mood normal.      LABORATORY DATA:  I have reviewed the data as listed    Latest Ref Rng & Units 01/10/2022    8:11 AM 12/20/2021    8:04 AM 11/29/2021    8:42 AM  CBC  WBC 4.0 - 10.5 K/uL 8.4  7.1  4.1   Hemoglobin 12.0 - 15.0 g/dL 10.8  10.8  8.4   Hematocrit 36.0 - 46.0 % 34.0  33.7  26.3   Platelets 150 - 400 K/uL 251  279  124       Latest Ref Rng & Units 01/10/2022    8:11 AM 12/20/2021    8:04 AM 11/29/2021    8:42 AM  CMP  Glucose 70 - 99 mg/dL 179  134  161   BUN 8 - 23 mg/dL 33  21  25   Creatinine 0.44 - 1.00 mg/dL 1.01  0.92  0.88   Sodium 135 - 145 mmol/L 139  139  139   Potassium 3.5 - 5.1 mmol/L 3.9  3.4  3.6   Chloride 98 - 111 mmol/L 109  107  104   CO2 22 - 32 mmol/L _0 Calcium 8.9 - 10.3 mg/dL 9.7  9.3  9.2   Total Protein 6.5 - 8.1 g/dL 7.1  7.4  6.8   Total Bilirubin 0.3 - 1.2 mg/dL 0.1  0.4  0.5   Alkaline Phos 38 - 126 U/L 63  66  63   AST 15 - 41 U/L _1 ALT 0 - 44 U/L _2 RADIOGRAPHIC STUDIES: I have personally reviewed the radiological images as listed and agreed with the findings in the report. No results found.

## 2022-01-10 NOTE — Assessment & Plan Note (Signed)
She is currently on immunotherapy.  Hemoglobin improved.  

## 2022-01-10 NOTE — Assessment & Plan Note (Addendum)
s/p carboplatin and Taxol for 5 cycles, on Keytruda maintenance.  Labs are reviewed and discussed with patient. Proceed with maintenance.  Keytruda  Obtain CT for surveillance. Creatinine is slightly elevated. Encourage oral hydration.

## 2022-01-10 NOTE — Assessment & Plan Note (Signed)
Treatment plan as listed above. 

## 2022-01-10 NOTE — Progress Notes (Signed)
patient here for follow up. No new problems or concerns. Pt reports appetite has been poor, but she is being followed by dietitian. Pt feeling lightheaded this morning so pt is now in wheelchair. Pt feels she may be dehydrated because she did not drink plenty of fluids yesterday.

## 2022-01-10 NOTE — Assessment & Plan Note (Signed)
discussed about lifestyle modification, healthy diet and exercise.

## 2022-01-22 ENCOUNTER — Ambulatory Visit: Payer: Medicare PPO

## 2022-01-22 ENCOUNTER — Ambulatory Visit
Admission: RE | Admit: 2022-01-22 | Discharge: 2022-01-22 | Disposition: A | Discharge status: Home / Self Care | Payer: Medicare PPO | Source: Ambulatory Visit | Attending: Oncology | Admitting: Oncology

## 2022-01-22 DIAGNOSIS — C541 Malignant neoplasm of endometrium: Secondary | ICD-10-CM | POA: Diagnosis present

## 2022-01-22 MED ORDER — IOHEXOL 300 MG/ML  SOLN
100.0000 mL | Freq: Once | INTRAMUSCULAR | Status: AC | PRN
  Administered 2022-01-22: 100 mL via INTRAVENOUS

## 2022-01-31 ENCOUNTER — Inpatient Hospital Stay (HOSPITAL_BASED_OUTPATIENT_CLINIC_OR_DEPARTMENT_OTHER): Payer: Medicare PPO | Admitting: Oncology

## 2022-01-31 ENCOUNTER — Encounter: Payer: Self-pay | Admitting: Oncology

## 2022-01-31 ENCOUNTER — Inpatient Hospital Stay: Payer: Medicare PPO | Attending: Obstetrics and Gynecology

## 2022-01-31 ENCOUNTER — Inpatient Hospital Stay: Payer: Medicare PPO

## 2022-01-31 VITALS — BP 131/64 | HR 91 | Temp 98.1°F | Resp 18 | Wt 279.3 lb

## 2022-01-31 DIAGNOSIS — E278 Other specified disorders of adrenal gland: Secondary | ICD-10-CM | POA: Diagnosis not present

## 2022-01-31 DIAGNOSIS — C541 Malignant neoplasm of endometrium: Secondary | ICD-10-CM

## 2022-01-31 DIAGNOSIS — Z23 Encounter for immunization: Secondary | ICD-10-CM

## 2022-01-31 DIAGNOSIS — Z5112 Encounter for antineoplastic immunotherapy: Secondary | ICD-10-CM | POA: Diagnosis present

## 2022-01-31 DIAGNOSIS — D6481 Anemia due to antineoplastic chemotherapy: Secondary | ICD-10-CM

## 2022-01-31 DIAGNOSIS — Z923 Personal history of irradiation: Secondary | ICD-10-CM | POA: Insufficient documentation

## 2022-01-31 DIAGNOSIS — T451X5A Adverse effect of antineoplastic and immunosuppressive drugs, initial encounter: Secondary | ICD-10-CM

## 2022-01-31 LAB — COMPREHENSIVE METABOLIC PANEL
ALT: 15 U/L (ref 0–44)
AST: 23 U/L (ref 15–41)
Albumin: 3.9 g/dL (ref 3.5–5.0)
Alkaline Phosphatase: 59 U/L (ref 38–126)
Anion gap: 10 (ref 5–15)
BUN: 26 mg/dL — ABNORMAL HIGH (ref 8–23)
CO2: 22 mmol/L (ref 22–32)
Calcium: 9.8 mg/dL (ref 8.9–10.3)
Chloride: 106 mmol/L (ref 98–111)
Creatinine, Ser: 1.05 mg/dL — ABNORMAL HIGH (ref 0.44–1.00)
GFR, Estimated: 57 mL/min — ABNORMAL LOW (ref >60)
Glucose, Bld: 188 mg/dL — ABNORMAL HIGH (ref 70–99)
Potassium: 4.1 mmol/L (ref 3.5–5.1)
Sodium: 138 mmol/L (ref 135–145)
Total Bilirubin: 0.6 mg/dL (ref 0.3–1.2)
Total Protein: 7.5 g/dL (ref 6.5–8.1)

## 2022-01-31 LAB — CBC WITH DIFFERENTIAL/PLATELET
Abs Immature Granulocytes: 0.05 10*3/uL (ref 0.00–0.07)
Basophils Absolute: 0 10*3/uL (ref 0.0–0.1)
Basophils Relative: 1 %
Eosinophils Absolute: 1.3 10*3/uL — ABNORMAL HIGH (ref 0.0–0.5)
Eosinophils Relative: 16 %
HCT: 35.2 % — ABNORMAL LOW (ref 36.0–46.0)
Hemoglobin: 11.2 g/dL — ABNORMAL LOW (ref 12.0–15.0)
Immature Granulocytes: 1 %
Lymphocytes Relative: 20 %
Lymphs Abs: 1.6 10*3/uL (ref 0.7–4.0)
MCH: 32.3 pg (ref 26.0–34.0)
MCHC: 31.8 g/dL (ref 30.0–36.0)
MCV: 101.4 fL — ABNORMAL HIGH (ref 80.0–100.0)
Monocytes Absolute: 0.7 10*3/uL (ref 0.1–1.0)
Monocytes Relative: 9 %
Neutro Abs: 4.2 10*3/uL (ref 1.7–7.7)
Neutrophils Relative %: 53 %
Platelets: 250 10*3/uL (ref 150–400)
RBC: 3.47 MIL/uL — ABNORMAL LOW (ref 3.87–5.11)
RDW: 13.9 % (ref 11.5–15.5)
WBC: 7.8 10*3/uL (ref 4.0–10.5)
nRBC: 0 % (ref 0.0–0.2)

## 2022-01-31 MED ORDER — SODIUM CHLORIDE 0.9 % IV SOLN
200.0000 mg | Freq: Once | INTRAVENOUS | Status: AC
  Administered 2022-01-31: 200 mg via INTRAVENOUS
  Filled 2022-01-31: qty 8

## 2022-01-31 MED ORDER — SODIUM CHLORIDE 0.9 % IV SOLN
Freq: Once | INTRAVENOUS | Status: AC
  Filled 2022-01-31: qty 250

## 2022-01-31 MED ORDER — INFLUENZA VAC A&B SA ADJ QUAD 0.5 ML IM PRSY
0.5000 mL | PREFILLED_SYRINGE | Freq: Once | INTRAMUSCULAR | Status: AC
  Administered 2022-01-31: 0.5 mL via INTRAMUSCULAR

## 2022-01-31 MED ORDER — HEPARIN SOD (PORK) LOCK FLUSH 100 UNIT/ML IV SOLN
500.0000 [IU] | Freq: Once | INTRAVENOUS | Status: AC | PRN
  Administered 2022-01-31: 500 [IU]
  Filled 2022-01-31: qty 5

## 2022-01-31 NOTE — Assessment & Plan Note (Addendum)
s/p carboplatin and Taxol for 5 cycles, on Keytruda maintenance.  Labs are reviewed and discussed with patient. Proceed with maintenance.  Keytruda  CT showed NED Creatinine is slightly elevated. Encourage oral hydration.

## 2022-01-31 NOTE — Assessment & Plan Note (Signed)
Treatment plan as listed above. 

## 2022-01-31 NOTE — Assessment & Plan Note (Signed)
She is currently on immunotherapy.  Hemoglobin improved.  

## 2022-01-31 NOTE — Assessment & Plan Note (Signed)
lipid poor hyperfunctioning adenoma vs mets.  FDG avid on PET scan.  Difficult biopsy due to patient's body habitus and deep position of the mass. Resolved on current PET scan, suggesting that this lesion might be a metastatic lesion. CT imaging was reviewed with patient.

## 2022-01-31 NOTE — Patient Instructions (Signed)
[facility]  Discharge Instructions: Thank you for choosing [facility] to provide your oncology and hematology care.  If you have a lab appointment with the Cancer Center, please go directly to the Cancer Center and check in at the registration area.  Wear comfortable clothing and clothing appropriate for easy access to any Portacath or PICC line.   We strive to give you quality time with your provider. You may need to reschedule your appointment if you arrive late (15 or more minutes).  Arriving late affects you and other patients whose appointments are after yours.  Also, if you miss three or more appointments without notifying the office, you may be dismissed from the clinic at the provider's discretion.      For prescription refill requests, have your pharmacy contact our office and allow 72 hours for refills to be completed.    Today you received the following chemotherapy and/or immunotherapy agents KEYTRUDA      To help prevent nausea and vomiting after your treatment, we encourage you to take your nausea medication as directed.  BELOW ARE SYMPTOMS THAT SHOULD BE REPORTED IMMEDIATELY: *FEVER GREATER THAN 100.4 F (38 C) OR HIGHER *CHILLS OR SWEATING *NAUSEA AND VOMITING THAT IS NOT CONTROLLED WITH YOUR NAUSEA MEDICATION *UNUSUAL SHORTNESS OF BREATH *UNUSUAL BRUISING OR BLEEDING *URINARY PROBLEMS (pain or burning when urinating, or frequent urination) *BOWEL PROBLEMS (unusual diarrhea, constipation, pain near the anus) TENDERNESS IN MOUTH AND THROAT WITH OR WITHOUT PRESENCE OF ULCERS (sore throat, sores in mouth, or a toothache) UNUSUAL RASH, SWELLING OR PAIN  UNUSUAL VAGINAL DISCHARGE OR ITCHING   Items with * indicate a potential emergency and should be followed up as soon as possible or go to the Emergency Department if any problems should occur.  Please show the CHEMOTHERAPY ALERT CARD or IMMUNOTHERAPY ALERT CARD at check-in to  the Emergency Department and triage nurse.  Should you have questions after your visit or need to cancel or reschedule your appointment, please contact [facility]  [phone] and follow the prompts.  Office hours are 8:00 a.m. to 4:30 p.m. Monday - Friday. Please note that voicemails left after 4:00 p.m. may not be returned until the following business day.  We are closed weekends and major holidays. You have access to a nurse at all times for urgent questions. Please call the main number to the clinic [phone] and follow the prompts.  For any non-urgent questions, you may also contact your provider using MyChart. We now offer e-Visits for anyone 18 and older to request care online for non-urgent symptoms. For details visit [email or web address].   Also download the MyChart app! Go to the app store, search "MyChart", open the app, select [facility], and log in with your MyChart username and password.  Masks are optional in the cancer centers. If you would like for your care team to wear a mask while they are taking care of you, please let them know. For doctor visits, patients may have with them one support person who is at least 70 years old. At this time, visitors are not allowed in the infusion area. Pembrolizumab Injection What is this medication? PEMBROLIZUMAB (PEM broe LIZ ue mab) treats some types of cancer. It works by helping your immune system slow or stop the spread of cancer cells. It is a monoclonal antibody. This medicine may be used for other purposes; ask your health care provider or pharmacist if you have questions. COMMON   BRAND NAME(S): Keytruda What should I tell my care team before I take this medication? They need to know if you have any of these conditions: Allogeneic stem cell transplant (uses someone else's stem cells) Autoimmune diseases, such as Crohn disease, ulcerative colitis, lupus History of chest radiation Nervous system  problems, such as Guillain-Barre syndrome, myasthenia gravis Organ transplant An unusual or allergic reaction to pembrolizumab, other medications, foods, dyes, or preservatives Pregnant or trying to get pregnant Breast-feeding How should I use this medication? This medication is injected into a vein. It is given by your care team in a hospital or clinic setting. A special MedGuide will be given to you before each treatment. Be sure to read this information carefully each time. Talk to your care team about the use of this medication in children. While it may be prescribed for children as young as 6 months for selected conditions, precautions do apply. Overdosage: If you think you have taken too much of this medicine contact a poison control center or emergency room at once. NOTE: This medicine is only for you. Do not share this medicine with others. What if I miss a dose? Keep appointments for follow-up doses. It is important not to miss your dose. Call your care team if you are unable to keep an appointment. What may interact with this medication? Interactions have not been studied. This list may not describe all possible interactions. Give your health care provider a list of all the medicines, herbs, non-prescription drugs, or dietary supplements you use. Also tell them if you smoke, drink alcohol, or use illegal drugs. Some items may interact with your medicine. What should I watch for while using this medication? Your condition will be monitored carefully while you are receiving this medication. You may need blood work while taking this medication. This medication may cause serious skin reactions. They can happen weeks to months after starting the medication. Contact your care team right away if you notice fevers or flu-like symptoms with a rash. The rash may be red or purple and then turn into blisters or peeling of the skin. You may also notice a red rash with swelling of the face, lips, or  lymph nodes in your neck or under your arms. Tell your care team right away if you have any change in your eyesight. Talk to your care team if you may be pregnant. Serious birth defects can occur if you take this medication during pregnancy and for 4 months after the last dose. You will need a negative pregnancy test before starting this medication. Contraception is recommended while taking this medication and for 4 months after the last dose. Your care team can help you find the option that works for you. Do not breastfeed while taking this medication and for 4 months after the last dose. What side effects may I notice from receiving this medication? Side effects that you should report to your care team as soon as possible: Allergic reactions--skin rash, itching, hives, swelling of the face, lips, tongue, or throat Dry cough, shortness of breath or trouble breathing Eye pain, redness, irritation, or discharge with blurry or decreased vision Heart muscle inflammation--unusual weakness or fatigue, shortness of breath, chest pain, fast or irregular heartbeat, dizziness, swelling of the ankles, feet, or hands Hormone gland problems--headache, sensitivity to light, unusual weakness or fatigue, dizziness, fast or irregular heartbeat, increased sensitivity to cold or heat, excessive sweating, constipation, hair loss, increased thirst or amount of urine, tremors or shaking, irritability Infusion reactions--chest   pain, shortness of breath or trouble breathing, feeling faint or lightheaded Kidney injury (glomerulonephritis)--decrease in the amount of urine, red or dark brown urine, foamy or bubbly urine, swelling of the ankles, hands, or feet Liver injury--right upper belly pain, loss of appetite, nausea, light-colored stool, dark yellow or brown urine, yellowing skin or eyes, unusual weakness or fatigue Pain, tingling, or numbness in the hands or feet, muscle weakness, change in vision, confusion or trouble  speaking, loss of balance or coordination, trouble walking, seizures Rash, fever, and swollen lymph nodes Redness, blistering, peeling, or loosening of the skin, including inside the mouth Sudden or severe stomach pain, bloody diarrhea, fever, nausea, vomiting Side effects that usually do not require medical attention (report to your care team if they continue or are bothersome): Bone, joint, or muscle pain Diarrhea Fatigue Loss of appetite Nausea Skin rash This list may not describe all possible side effects. Call your doctor for medical advice about side effects. You may report side effects to FDA at 1-800-FDA-1088. Where should I keep my medication? This medication is given in a hospital or clinic. It will not be stored at home. NOTE: This sheet is a summary. It may not cover all possible information. If you have questions about this medicine, talk to your doctor, pharmacist, or health care provider.  2023 Elsevier/Gold Standard (2012-11-22 00:00:00)    

## 2022-01-31 NOTE — Progress Notes (Signed)
Hematology/Oncology Progress note [phone] [phone]         Patient Care Team: Tracie Harrier, MD as PCP - General (Internal Medicine)   CHIEF COMPLAINTS/REASON FOR VISIT:  Follow up for endometrial cancer  ASSESSMENT & PLAN:   Cancer Staging  Endometrial adenocarcinoma [facility]) Staging form: Corpus Uteri - Carcinoma and Carcinosarcoma, AJCC 8th Edition - Clinical stage from 04/10/2021: FIGO Stage III, calculated as Stage Unknown (cT3, cNX, cM0) - Signed by Earlie Server, MD on 08/24/2021   Endometrial adenocarcinoma ([place]) s/p carboplatin and Taxol for 5 cycles, on Keytruda maintenance.  Labs are reviewed and discussed with patient. Proceed with maintenance.  Keytruda  CT showed NED Creatinine is slightly elevated. Encourage oral hydration.   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  FDG avid on PET scan.  Difficult biopsy due to patient's body habitus and deep position of the mass. Resolved on current PET scan, suggesting that this lesion might be a metastatic lesion. CT imaging was reviewed with patient.   Anemia due to antineoplastic chemotherapy She is currently on immunotherapy.  Hemoglobin improved.   Encounter for antineoplastic immunotherapy Treatment plan as listed above.    Orders Placed This Encounter  Procedures   CBC with Differential    Standing Status:   Future    Standing Expiration Date:   04/05/2023   Comprehensive metabolic panel    Standing Status:   Future    Standing Expiration Date:   04/05/2023    Return of visit:  3 weeks Lab MD Octaviano Batty, MD, [name] [facility] Hematology Oncology 01/31/2022     HISTORY OF PRESENTING ILLNESS:   Valerie Case is a  71 y.o.  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 Oncology History  Endometrial adenocarcinoma ([place])  12/12/2020 Imaging   12/12/2020, CT abdomen pelvis with contrast showed  no radiographic evidence of urinary tract neoplasm, calculi, hydronephrosis.  2.6 cm nonspecific left adrenal mass. 02/12/2021 CT hematuria work-up showed small uterine fibroids, no findings to explain hematuria.Hepatomegaly, diverticulosis without evidence of diverticulitis, Coronary artery disease   04/01/2021 -  Hospital Admission   04/01/2021 - 04/03/2021, patient was hospitalized due to symptomatic anemia, hemoglobin 6.9, heavy postmenopausal bleeding with passing large clots.  She also presented with increased creatinine level to 2.58 compared to her baseline level of 0.9 in November 2022.  Patient received IV iron infusion and 2 units of PRBC during the hospital stay..  04/02/2021, iron panel showed iron saturation 37, ferritin 37, TIBC 333-the studies were done after patient received blood transfusion.  At discharge, hemoglobin was 7.7.   04/01/2021 Initial Diagnosis   04/01/2020, endometrial biopsy showed poorly differentiated endometrial adenocarcinoma. Omniseq NGS showed TMB 48.4 mt/mb [high], MSI High, PD-L1-TPS <1%, PIK3CA O9629B, Negative for BRAF, HER2, NTRK1 RET.     04/10/2021 Cancer Staging   Staging form: Corpus Uteri - Carcinoma and Carcinosarcoma, AJCC 8th Edition - Clinical stage from 04/10/2021: FIGO Stage III, calculated as Stage Unknown (cT3, cNX, cM0) - Signed by Earlie Server, MD on 08/24/2021 Stage prefix: Initial diagnosis   04/15/2021 Imaging   PET showed 1. Large hypermetabolic endometrial mass consistent with known endometrial cancer. Suspect direct invasion/involvement of the right adnexa. Right pelvic sidewall hypermetabolic adenopathy.2. 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 3. No findings for metastatic disease involving the chest or bonystructures.  04/24/2021 - 05/29/2021 Radiation Therapy   status post radiation to pelvis   08/05/2021 Imaging   MRI abdomen w wo  contrast 1. Stable solid enhancing 2.7 cm left adrenal lesion, which was hypermetabolic on prior PET/CTs but is unchanged in size dating back to December 12, 2020. 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 However, unfortunately metastatic disease can not be entirely excluded and remains a pertinent differential consideration. Comparison with more remote prior imaging would be the most valuable tool in the assessment of this lesion, as demonstrating long-term stability would indicate this to be a benign lesion. However, if no prior imaging can be made available, would consider follow-up adrenal protocol CT with and without intravenous contrast material in 3 months as this would allow for further assessment of stability as well as enhancement and washout characteristics of the lesion potentially allowing for better characterization versus direct tissue sampling.2. Hepatomegaly and hepatic steatosis.3. Colonic diverticulosis without findings of acute diverticulitis    08/16/2021 - 09/27/2021 Chemotherapy   UTERINE Carboplatin AUC 5 / Paclitaxel q21d x 3      10/03/2021 Imaging   PET 1. No residual hypermetabolism in the uterus suggesting an excellent response to treatment. No findings for metastatic disease. 2. Resolution of hypermetabolism in the left adrenal gland lesion suggesting this was metastatic disease. 3. Diffuse marrow hypermetabolism likely due to rebound from chemotherapy or marrow stimulating drugs   10/18/2021 - 11/08/2021 Chemotherapy   AUC 5 / Paclitaxel/ Keytruda Q21d  x 2 cycles   11/29/2021 -  Chemotherapy   Patient is on Treatment Plan : UTERINE Pembrolizumab (200) q21d         INTERVAL HISTORY Valerie Case is a 71 y.o. female who has above history reviewed by me today presents for follow up visit for  anemia and endometrial cancer Overall she tolerates treatment, No new complaints.  Review of Systems  Constitutional:  Positive for fatigue. Negative for chills and fever.  HENT:   Negative for hearing loss and voice change.   Eyes:  Negative for eye problems.  Respiratory:  Negative for chest tightness and cough.   Cardiovascular:  Negative for chest pain.  Gastrointestinal:  Negative for abdominal distention, abdominal pain and blood in stool.  Endocrine: Negative for hot flashes.  Genitourinary:  Negative for difficulty urinating, frequency and vaginal bleeding.   Musculoskeletal:  Positive for arthralgias.  Skin:  Negative for itching and rash.  Neurological:  Negative for extremity weakness.  Hematological:  Negative for adenopathy.  Psychiatric/Behavioral:  Negative for confusion.     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    SURGICAL HISTORY: Past Surgical History:  Procedure Laterality Date   CESAREAN SECTION  01/23/1979   IR IMAGING GUIDED PORT INSERTION  08/09/2021   TONSILLECTOMY  03/17/1956    SOCIAL HISTORY: Social History   Socioeconomic History   Marital status: Divorced    Spouse name: Not on file   Number of children: Not on file   Years of education: Not on file   Highest education level: Not   on file  Occupational History   Not on file  Tobacco Use   Smoking status: Never   Smokeless tobacco: Never  Vaping Use   Vaping Use: Never used  Substance and Sexual Activity   Alcohol use: Not Currently   Drug use: Never   Sexual activity: Not Currently  Other Topics Concern   Not on file  Social History Narrative      Social Determinants of Health   Financial Resource  Strain: Low Risk  (09/27/2021)   Overall Financial Resource Strain (CARDIA)    Difficulty of Paying Living Expenses: Not very hard  Food Insecurity: No Food Insecurity (09/27/2021)   Hunger Vital Sign    Worried About Running Out of Food in the Last Year: Never true    Ran Out of Food in the Last Year: Never true  Transportation Needs: No Transportation Needs (09/27/2021)   PRAPARE - Transportation    Lack of Transportation (Medical): No    Lack of Transportation (Non-Medical): No  Physical Activity: Inactive (09/27/2021)   Exercise Vital Sign    Days of Exercise per Week: 0 days    Minutes of Exercise per Session: 0 min  Stress: Stress Concern Present (09/27/2021)   [employer] - Occupational Stress Questionnaire    Feeling of Stress : Rather much  Social Connections: Moderately Integrated (09/27/2021)   Social Connection and Isolation Panel [NHANES]    Frequency of Communication with Friends and Family: Three times a week    Frequency of Social Gatherings with Friends and Family: Three times a week    Attends Religious Services: 1 to 4 times per year    Active Member of Clubs or Organizations: No    Attends Club or Organization Meetings: 1 to 4 times per year    Marital Status: Divorced  Intimate Partner Violence: Not At Risk (09/27/2021)   Humiliation, Afraid, Rape, and Kick questionnaire    Fear of Current or Ex-Partner: No    Emotionally Abused: No    Physically Abused: No    Sexually Abused: No    FAMILY HISTORY: Family History  Problem Relation Age of Onset   Breast cancer Mother 73   Cancer Mother    Cancer Father    Lung cancer Father    Breast cancer Cousin        dx 40s maternal    ALLERGIES:  is allergic to aspirin.  MEDICATIONS:  Current Outpatient Medications  Medication Sig Dispense Refill   acetaminophen (TYLENOL) 650 MG CR tablet Take 1,300 mg by mouth every 8 (eight) hours as needed for pain.     albuterol (VENTOLIN HFA) 108  (90 Base) MCG/ACT inhaler Inhale 2 puffs into the lungs every 6 (six) hours as needed for wheezing or shortness of breath.     Cranberry-Vitamin C-Probiotic (AZO CRANBERRY PO) Take by mouth.     docusate sodium (COLACE) 100 MG capsule Take 1 capsule (100 mg total) by mouth 2 (two) times daily. 60 capsule 1   empagliflozin (JARDIANCE) 25 MG TABS tablet Take 25 mg by mouth daily.     ferrous sulfate 325 (65 FE) MG EC tablet Take 325 mg by mouth daily with breakfast.     FLUoxetine (PROZAC) 10 MG capsule Take 10 mg by mouth daily.     fluticasone (FLONASE) 50 MCG/ACT nasal spray Place 1 spray into both nostrils daily as needed for allergies or rhinitis.     hydrochlorothiazide (HYDRODIURIL) 12.5 MG tablet Take 12.5 mg by   mouth daily.     hydrocortisone 2.5 % cream Apply topically 2 (two) times daily. 30 g 2   ibuprofen (ADVIL) 200 MG tablet Take 400 mg by mouth every 8 (eight) hours as needed for moderate pain.     lidocaine-prilocaine (EMLA) cream Apply to affected area once 30 g 3   lisinopril (ZESTRIL) 40 MG tablet Take 40 mg by mouth at bedtime.     LORazepam (ATIVAN) 0.5 MG tablet Take 1 tablet (0.5 mg total) by mouth every 8 (eight) hours as needed for anxiety (nausea vomiting). 30 tablet 0   metFORMIN (GLUCOPHAGE-XR) 500 MG 24 hr tablet Take 1,000 mg by mouth at bedtime.     metoprolol succinate (TOPROL-XL) 25 MG 24 hr tablet Take 25 mg by mouth daily.     NOVOLIN 70/30 FLEXPEN (70-30) 100 UNIT/ML KwikPen 50-110 Units See admin instructions. 50 units in the morning, 110 units at bedtime     ondansetron (ZOFRAN) 8 MG tablet Take 1 tablet (8 mg total) by mouth 2 (two) times daily as needed. Start on the third day after chemotherapy. 30 tablet 1   OZEMPIC, 1 MG/DOSE, 4 MG/3ML SOPN Inject 1 mg into the skin every Tuesday.     prochlorperazine (COMPAZINE) 10 MG tablet Take 1 tablet (10 mg total) by mouth every 6 (six) hours as needed (Nausea or vomiting). 30 tablet 1   rosuvastatin (CRESTOR) 10 MG  tablet Take 10 mg by mouth daily.     traMADol (ULTRAM) 50 MG tablet Take 2 tablets (100 mg total) by mouth every 6 (six) hours as needed. 90 tablet 0   potassium chloride SA (KLOR-CON M) 20 MEQ tablet Take 1 tablet (20 mEq total) by mouth daily. (Patient not taking: Reported on 01/31/2022) 3 tablet 0   No current facility-administered medications for this visit.   Facility-Administered Medications Ordered in Other Visits  Medication Dose Route Frequency Provider Last Rate Last Admin   heparin lock flush 100 UNIT/ML injection              PHYSICAL EXAMINATION: ECOG PERFORMANCE STATUS: 1 - Symptomatic but completely ambulatory  Physical Exam Constitutional:      General: She is not in acute distress.    Appearance: She is obese.  HENT:     Head: Normocephalic and atraumatic.  Eyes:     General: No scleral icterus. Cardiovascular:     Rate and Rhythm: Normal rate and regular rhythm.     Heart sounds: Normal heart sounds.  Pulmonary:     Effort: Pulmonary effort is normal. No respiratory distress.     Breath sounds: No wheezing.  Abdominal:     General: Bowel sounds are normal. There is no distension.     Palpations: Abdomen is soft.  Musculoskeletal:        General: No deformity. Normal range of motion.     Cervical back: Normal range of motion and neck supple.     Right lower leg: Edema present.     Left lower leg: Edema present.     Comments:    Skin:    General: Skin is warm and dry.     Findings: No erythema or rash.     Comments: Chronic venous sufficiency skin changes  Neurological:     Mental Status: She is alert and oriented to person, place, and time. Mental status is at baseline.     Cranial Nerves: No cranial nerve deficit.     Coordination: Coordination normal.  Psychiatric:  Mood and Affect: Mood normal.      LABORATORY DATA:  I have reviewed the data as listed    Latest Ref Rng & Units 01/31/2022    8:02 AM 01/10/2022    8:11 AM 12/20/2021     8:04 AM  CBC  WBC 4.0 - 10.5 K/uL 7.8  8.4  7.1   Hemoglobin 12.0 - 15.0 g/dL 11.2  10.8  10.8   Hematocrit 36.0 - 46.0 % 35.2  34.0  33.7   Platelets 150 - 400 K/uL 250  251  279       Latest Ref Rng & Units 01/31/2022    8:02 AM 01/10/2022    8:11 AM 12/20/2021    8:04 AM  CMP  Glucose 70 - 99 mg/dL 188  179  134   BUN 8 - 23 mg/dL 26  33  21   Creatinine 0.44 - 1.00 mg/dL 1.05  1.01  0.92   Sodium 135 - 145 mmol/L 138  139  139   Potassium 3.5 - 5.1 mmol/L 4.1  3.9  3.4   Chloride 98 - 111 mmol/L 106  109  107   CO2 22 - 32 mmol/L _0 Calcium 8.9 - 10.3 mg/dL 9.8  9.7  9.3   Total Protein 6.5 - 8.1 g/dL 7.5  7.1  7.4   Total Bilirubin 0.3 - 1.2 mg/dL 0.6  0.1  0.4   Alkaline Phos 38 - 126 U/L 59  63  66   AST 15 - 41 U/L _1 ALT 0 - 44 U/L _2 RADIOGRAPHIC STUDIES: I have personally reviewed the radiological images as listed and agreed with the findings in the report. CT CHEST ABDOMEN PELVIS W CONTRAST  Result Date: 01/24/2022 CLINICAL DATA:  Follow-up of endometrial carcinoma. * Tracking Code: BO * EXAM: CT CHEST, ABDOMEN, AND PELVIS WITH CONTRAST TECHNIQUE: Multidetector CT imaging of the chest, abdomen and pelvis was performed following the standard protocol during bolus administration of intravenous contrast. RADIATION DOSE REDUCTION: This exam was performed according to the departmental dose-optimization program which includes automated exposure control, adjustment of the mA and/or kV according to patient size and/or use of iterative reconstruction technique. CONTRAST:  142m OMNIPAQUE IOHEXOL 300 MG/ML  SOLN COMPARISON:  10/03/2021 PET.  Abdominal MRI 08/05/2021. FINDINGS: CT CHEST FINDINGS Cardiovascular: Right Port-A-Cath tip, mid right atrium. Aortic atherosclerosis. Moderate cardiomegaly, without pericardial effusion. Three vessel coronary artery calcification. No central pulmonary embolism, on this non-dedicated study. Mediastinum/Nodes:  No supraclavicular adenopathy. No mediastinal or hilar adenopathy. Tiny hiatal hernia. Lungs/Pleura: No pleural fluid.  Clear lungs. Musculoskeletal: No acute osseous abnormality. Presumed sebaceous cyst about the left paramidline chest wall of 1.0 cm on 02/03. CT ABDOMEN PELVIS FINDINGS Hepatobiliary: Hepatomegaly at 22.4 cm. Similar hepatic cysts and too small to characterize lesions. Normal gallbladder, without biliary ductal dilatation. Pancreas: Normal, without mass or ductal dilatation. Spleen: Normal in size, without focal abnormality. Adrenals/Urinary Tract: Left adrenal nodule of 2.7 by 2.3 cm is similar including back to 02/12/2021. Normal right adrenal gland and kidney. Left renal interpolar lesion favored to represent a minimally complex cyst, including at up to 25 HU, when compared to the prior noncontrast CT from PET . In the absence of clinically indicated signs/symptoms require(s) no independent follow-up. Normal urinary bladder. Stomach/Bowel: Normal remainder of the stomach. Transverse duodenal diverticulum. Otherwise normal small bowel. Extensive colonic diverticulosis.  Normal terminal ileum. Appendix not visualized. Vascular/Lymphatic: Aortic atherosclerosis. No abdominopelvic adenopathy. Reproductive: No residual uterine mass, given mild limitations of patient body habitus. Subtle foci of gas within the lower uterine segment and cervix including on 107 and 110 of series 3 are nonspecific, present on prior PET. No adnexal mass. Other: No significant free fluid. Mild pelvic floor laxity. No evidence of omental or peritoneal disease. Musculoskeletal: Lumbosacral spondylosis with grade 1 L4-5 anterolisthesis. IMPRESSION: 1. No findings of progressive metastatic disease within the chest, abdomen, or pelvis. 2. Left adrenal nodule is similar in size to 10/03/2021 PET. This was felt to be suspicious for treated metastasis on prior PET. 3. Hepatomegaly 4. Coronary artery atherosclerosis. Aortic  Atherosclerosis (ICD10-I70.0). 5.  Tiny hiatal hernia. Electronically Signed   By: Kyle  Talbot M.D.   On: 01/24/2022 14:00        

## 2022-02-05 ENCOUNTER — Inpatient Hospital Stay (HOSPITAL_BASED_OUTPATIENT_CLINIC_OR_DEPARTMENT_OTHER): Payer: Medicare PPO | Admitting: Obstetrics and Gynecology

## 2022-02-05 VITALS — BP 132/62 | HR 95 | Temp 97.6°F | Resp 20 | Wt 276.3 lb

## 2022-02-05 DIAGNOSIS — Z5112 Encounter for antineoplastic immunotherapy: Secondary | ICD-10-CM | POA: Diagnosis not present

## 2022-02-05 DIAGNOSIS — E278 Other specified disorders of adrenal gland: Secondary | ICD-10-CM

## 2022-02-05 DIAGNOSIS — C541 Malignant neoplasm of endometrium: Secondary | ICD-10-CM | POA: Diagnosis not present

## 2022-02-05 DIAGNOSIS — D6481 Anemia due to antineoplastic chemotherapy: Secondary | ICD-10-CM

## 2022-02-05 NOTE — Progress Notes (Signed)
Gynecologic Oncology Interval Visit   Referring Provider: Dr. Glennon Mac  Chief Complaint: FIGO grade III endometrial adenocarcinoma  Subjective:  Valerie Case is a 71 y.o. female who is seen in consultation from Dr. Glennon Mac for poorly differentiated endometrial adenocarcinoma, figo grade III, s/p radiation x 5 weeks to control bleeding, felt to be poor surgical candidate, received chemotherapy who presents to clinic for follow up.   She is currently on maintenance pembrolizumab and doing well. She walked into clinic with a cane today. Previously she had come to clinic in a wheelchair. Her last imaging study 01/2022 was very reassuring.   01/22/2022 CT C/A/P IMPRESSION: 1. No findings of progressive metastatic disease within the chest, abdomen, or pelvis. 2. Left adrenal nodule is similar in size to 10/03/2021 PET. This was felt to be suspicious for treated metastasis on prior PET. 3. Hepatomegaly 4. Coronary artery atherosclerosis. Aortic Atherosclerosis (ICD10-I70.0). 5.  Tiny hiatal hernia.  Gynecologic Oncology History Valerie Case is a pleasant female who is seen in consultation from Dr. Glennon Mac for poorly differentiated endometrial adenocarcinoma, figo grade III. Please see prior notes for complete detail  04/01/21 She was admitted  to [facility] with vaginal bleeding and received 2 units pRBCs and iron infusion. She underwent endometrial biopsy on which revealed poorly differentiated endometrial adenocarcinoma FIGO III  04/02/2021 BILATERAL LOWER EXTREMITY VENOUS DOPPLER ULTRASOUND No evidence of deep venous thrombosis in either lower extremity.  04/16/2021 PET IMPRESSION: 1. Large hypermetabolic endometrial mass consistent with known endometrial cancer. Suspect direct invasion/involvement of the right adnexa. Right pelvic sidewall hypermetabolic adenopathy. 2. Enlarged and hypermetabolic left adrenal gland lesion could reflect a lipid poor hyperfunctioning adenoma but  metastasis is also possible. 3. No findings for metastatic disease involving the chest or bony structures.  04/24/2021 Tumor Board Documentation Patient to undergo radiation therapy. Plan to reassess after radiation with imaging to re-evaluate possible surgical intervention.     04/24/2021 - 05/29/2021: She completed WPRT Dose/Fx (Gy): 1.8 #Fx: 25 / 25. Total Dose (Gy): 45  06/12/2021 PET IMPRESSION: 1. Substantial reduction in size and activity of the uterine mass. Substantial reduction in size and activity of the right pelvic sidewall lymph node. 2. The moderately hypermetabolic left adrenal mass is stable in size back through earliest available compare comparison of 12/12/2020, with roughly similar activity level to previous. The density characteristics are not specific for adrenal adenoma. Possibilities include benign and malignant adrenal neoplasms versus metastatic lesion. 3. Other imaging findings of potential clinical significance: Left mastoid effusion. Coronary and aortic atherosclerosis. Systemic atherosclerosis. Mild cardiomegaly. Lax anterior abdominal wall. Suspected cholelithiasis. Sigmoid colon diverticulosis. Congenital anomalies of the C1 vertebra. Anterolisthesis at L4-5.  Hypermetabolic left adrenal mass has been worked up for possible pheochromocytoma which was negative. She was referred to urology and saw Dr. Bernardo Heater who recommended CT guided biopsy but this was refused by IR.   She has been followed by Dr. Tasia Catchings for iron deficiency anemia secondary to blood loss in setting of endometrial cancer. She received IV iron and palliative radiation. Bleeding has stopped and hemoglobin has improved to 11.4. Blood sugars have been elevated and last HmgA1c was 7.9 (07/18/21)  08/05/2021 Imaging    MRI abdomen w wo contrast 1. Stable solid enhancing 2.7 cm left adrenal lesion, which was hypermetabolic on prior PET/CTs but is unchanged in size dating back to December 12, 2020. While the imaging  characteristics are again nonspecific, given its relative stability since September 2022 and the relative rarity of isolated adrenal metastases  this is favored to reflect a lipid poor adenoma. However, unfortunately metastatic disease can not be entirely excluded and remains a pertinent differential consideration. Comparison with more remote prior imaging would be the most valuable tool in the assessment of this lesion, as demonstrating long-term stability would indicate this to be a benign lesion. However, if no prior imaging can be made available, would consider follow-up adrenal protocol CT with and without intravenous contrast material in 3 months as this would allow for further assessment of stability as well as enhancement and washout characteristics of the lesion potentially allowing for better characterization versus direct tissue sampling.2. Hepatomegaly and hepatic steatosis.3. Colonic diverticulosis without findings of acute diverticulitis      Her case was discussed at Tumor Board. Dr. Fransisca Connors felt like she would be a candidate for surgery. After further discussion she opted for non-surgical management.    08/16/2021 - 09/27/2021  completed  3 cycles of carbo-paclitaxel chemotherapy.   PET 10/03/21  - no residual hypermetabolism of the uterus suggestive of excellent response to treatment. No findings of metastatic disease. Resolution of hypermetabolism of left adrenal lesions. Diffuse marrow hypermetabolism likely secondary to udenyca/GCSF vs chemotherapy.   10/18/2021 - 11/08/2021 completed  2 cycles of carbo-paclitaxel chemotherapy.  11/29/2021 started on pembrolizumab 200 mg every 3 weeks.   Tumor markers 08/20/21- [facility] NGS showed TMB 48.4 mt/mb [high], MSI High, PD-L1-TPS <1%, PIK3CA H1047Q, Negative for BRAF, HER2, NTRK1 RET. MMR was not reported.   .        Problem List: Patient Active Problem List   Diagnosis Date Noted   Encounter for antineoplastic immunotherapy 12/20/2021    Encounter for antineoplastic chemotherapy 08/24/2021   Morbid obesity with body mass index (BMI) of 40.0 to 44.9 in adult ([place]) 08/24/2021   Anemia due to antineoplastic chemotherapy 08/24/2021   Drug-induced constipation 08/23/2021   Port-A-Cath in place 08/15/2021   Venous insufficiency of both lower extremities 05/20/2021   Hepatomegaly 04/14/2021   Endometrial adenocarcinoma ([place]) 04/14/2021   Adrenal mass ([place]) 04/14/2021   Goals of care, counseling/discussion 04/14/2021   B12 deficiency 04/11/2021   AKI (acute kidney injury) ([place]) 04/01/2021   Essential hypertension 04/01/2021   Insulin dependent type 2 diabetes mellitus ([place]) 04/01/2021   Depression 04/01/2021   Recent unintentional weight loss over several months 04/01/2021   History of asthma 04/01/2021   Bilateral lower extremity edema 04/01/2021     Past Medical History: Past Medical History:  Diagnosis Date   Asthma    Cancer ([place])    Depression 04/01/2021   Diabetes mellitus without complication ([place])    Essential hypertension 04/01/2021   Hx of dysplastic nevus 07/16/2010   RLQA   Hypertension    Insulin dependent type 2 diabetes mellitus ([place]) 04/01/2021    Past Surgical History: Past Surgical History:  Procedure Laterality Date   CESAREAN SECTION  01/23/1979   IR IMAGING GUIDED PORT INSERTION  08/09/2021   TONSILLECTOMY  03/17/1956    Past Gynecologic History:  Post menopausal - 2006 STD: denies   OB History:  OB History  Gravida Para Term Preterm AB Living  _0 SAB IAB Ectopic Multiple Live Births          1    # Outcome Date GA Lbr Len/2nd Weight Sex Delivery Anes PTL Lv  1 Term 1980     CS-Unspec   LIV    Family History: Family History  Problem Relation Age of Onset  Breast cancer Mother 30   Cancer Mother    Cancer Father    Lung cancer Father    Breast cancer Cousin        dx 62s maternal    Social History: Social History   Socioeconomic History   Marital  status: Divorced    Spouse name: Not on file   Number of children: Not on file   Years of education: Not on file   Highest education level: Not on file  Occupational History   Not on file  Tobacco Use   Smoking status: Never   Smokeless tobacco: Never  Vaping Use   Vaping Use: Never used  Substance and Sexual Activity   Alcohol use: Not Currently   Drug use: Never   Sexual activity: Not Currently  Other Topics Concern   Not on file  Social History Narrative      Social Determinants of Health   Financial Resource Strain: Low Risk  (09/27/2021)   Overall Financial Resource Strain (CARDIA)    Difficulty of Paying Living Expenses: Not very hard  Food Insecurity: No Food Insecurity (09/27/2021)   Hunger Vital Sign    Worried About Running Out of Food in the Last Year: Never true    Ran Out of Food in the Last Year: Never true  Transportation Needs: No Transportation Needs (09/27/2021)   PRAPARE - Hydrologist (Medical): No    Lack of Transportation (Non-Medical): No  Physical Activity: Inactive (09/27/2021)   Exercise Vital Sign    Days of Exercise per Week: 0 days    Minutes of Exercise per Session: 0 min  Stress: Stress Concern Present (09/27/2021)   [place]    Feeling of Stress : Rather much  Social Connections: Moderately Integrated (09/27/2021)   Social Connection and Isolation Panel [NHANES]    Frequency of Communication with Friends and Family: Three times a week    Frequency of Social Gatherings with Friends and Family: Three times a week    Attends Religious Services: 1 to 4 times per year    Active Member of Clubs or Organizations: No    Attends Archivist Meetings: 1 to 4 times per year    Marital Status: Divorced  Intimate Partner Violence: Not At Risk (09/27/2021)   Humiliation, Afraid, Rape, and Kick questionnaire    Fear of Current or Ex-Partner: No     Emotionally Abused: No    Physically Abused: No    Sexually Abused: No    Allergies: Allergies  Allergen Reactions   Aspirin Rash    Childhood reaction     Current Medications: Current Outpatient Medications  Medication Sig Dispense Refill   acetaminophen (TYLENOL) 650 MG CR tablet Take 1,300 mg by mouth every 8 (eight) hours as needed for pain.     albuterol (VENTOLIN HFA) 108 (90 Base) MCG/ACT inhaler Inhale 2 puffs into the lungs every 6 (six) hours as needed for wheezing or shortness of breath.     Cranberry-Vitamin C-Probiotic (AZO CRANBERRY PO) Take by mouth.     docusate sodium (COLACE) 100 MG capsule Take 1 capsule (100 mg total) by mouth 2 (two) times daily. 60 capsule 1   empagliflozin (JARDIANCE) 25 MG TABS tablet Take 25 mg by mouth daily.     ferrous sulfate 325 (65 FE) MG EC tablet Take 325 mg by mouth daily with breakfast.     FLUoxetine (PROZAC) 10 MG capsule  Take 10 mg by mouth daily.     fluticasone (FLONASE) 50 MCG/ACT nasal spray Place 1 spray into both nostrils daily as needed for allergies or rhinitis.     hydrochlorothiazide (HYDRODIURIL) 12.5 MG tablet Take 12.5 mg by mouth daily.     hydrocortisone 2.5 % cream Apply topically 2 (two) times daily. 30 g 2   ibuprofen (ADVIL) 200 MG tablet Take 400 mg by mouth every 8 (eight) hours as needed for moderate pain.     lidocaine-prilocaine (EMLA) cream Apply to affected area once 30 g 3   lisinopril (ZESTRIL) 40 MG tablet Take 40 mg by mouth at bedtime.     LORazepam (ATIVAN) 0.5 MG tablet Take 1 tablet (0.5 mg total) by mouth every 8 (eight) hours as needed for anxiety (nausea vomiting). 30 tablet 0   metFORMIN (GLUCOPHAGE-XR) 500 MG 24 hr tablet Take 1,000 mg by mouth at bedtime.     metoprolol succinate (TOPROL-XL) 25 MG 24 hr tablet Take 25 mg by mouth daily.     NOVOLIN 70/30 FLEXPEN (70-30) 100 UNIT/ML KwikPen 50-110 Units See admin instructions. 50 units in the morning, 110 units at bedtime     ondansetron  (ZOFRAN) 8 MG tablet Take 1 tablet (8 mg total) by mouth 2 (two) times daily as needed. Start on the third day after chemotherapy. 30 tablet 1   OZEMPIC, 1 MG/DOSE, 4 MG/3ML SOPN Inject 1 mg into the skin every Tuesday.     prochlorperazine (COMPAZINE) 10 MG tablet Take 1 tablet (10 mg total) by mouth every 6 (six) hours as needed (Nausea or vomiting). 30 tablet 1   rosuvastatin (CRESTOR) 10 MG tablet Take 10 mg by mouth daily.     traMADol (ULTRAM) 50 MG tablet Take 2 tablets (100 mg total) by mouth every 6 (six) hours as needed. 90 tablet 0   potassium chloride SA (KLOR-CON M) 20 MEQ tablet Take 1 tablet (20 mEq total) by mouth daily. (Patient not taking: Reported on 01/31/2022) 3 tablet 0   No current facility-administered medications for this visit.   Facility-Administered Medications Ordered in Other Visits  Medication Dose Route Frequency Provider Last Rate Last Admin   heparin lock flush 100 UNIT/ML injection             Review of Systems General: chronic fatigue, weakness  HEENT: no complaints  Lungs: chronic shortness of breath and cough  Cardiac: no complaints  GI: decreased appetite, persistent n/v with intermittent diarrhea/constipation  GU: no complaints  Musculoskeletal: chronic back pain   Extremities: bilateral leg swelling  stable  Skin: no complaints  Neuro: no complaints  Endocrine: no complaints  Psych: no complaints      Objective:  Physical Examination:  BP 132/62   Pulse 95   Temp 97.6 F (36.4 C)   Resp 20   Wt 276 lb 4.8 oz (125.3 kg)   SpO2 98%   BMI 42.01 kg/m    Performance status: 2   GENERAL: elderly appearing female in no acute distress; able to ambulate independently HEENT:  atraumatic, normocephalic NODES:  No inguinal lymphadenopathy palpated.  LUNGS:  normal respiratory effort ABDOMEN:  Soft, nontender, nondistended. No masses or ascites NEURO:  Nonfocal. Well oriented.  Appropriate affect.  Pelvic: Exam continues to be challenging  and tender EGBUS: no lesions Cervix: no lesions on palpation, nontender, mobile Vagina: no lesions, no discharge or bleeding Uterus: : unable to determine size - does not seem to be enlarged, nontender, mobile Adnexa: no  palpable masses but limited by exam Rectovaginal: deferred     Lab Review Labs on site today: Lab Results  Component Value Date   WBC 7.8 01/31/2022   HGB 11.2 (L) 01/31/2022   HCT 35.2 (L) 01/31/2022   MCV 101.4 (H) 01/31/2022   PLT 250 01/31/2022      Latest Ref Rng & Units 01/31/2022    8:02 AM 01/10/2022    8:11 AM 12/20/2021    8:04 AM  CMP  Glucose 70 - 99 mg/dL 188  179  134   BUN 8 - 23 mg/dL 26  33  21   Creatinine 0.44 - 1.00 mg/dL 1.05  1.01  0.92   Sodium 135 - 145 mmol/L 138  139  139   Potassium 3.5 - 5.1 mmol/L 4.1  3.9  3.4   Chloride 98 - 111 mmol/L 106  109  107   CO2 22 - 32 mmol/L _0 Calcium 8.9 - 10.3 mg/dL 9.8  9.7  9.3   Total Protein 6.5 - 8.1 g/dL 7.5  7.1  7.4   Total Bilirubin 0.3 - 1.2 mg/dL 0.6  0.1  0.4   Alkaline Phos 38 - 126 U/L 59  63  66   AST 15 - 41 U/L _1 ALT 0 - 44 U/L _2 Latest Ref Rng & Units 01/31/2022    8:02 AM 01/10/2022    8:11 AM 12/20/2021    8:04 AM  Hepatic Function  Total Protein 6.5 - 8.1 g/dL 7.5  7.1  7.4   Albumin 3.5 - 5.0 g/dL 3.9  3.7  3.8   AST 15 - 41 U/L _3 ALT 0 - 44 U/L _4 Alk Phosphatase 38 - 126 U/L 59  63  66   Total Bilirubin 0.3 - 1.2 mg/dL 0.6  0.1  0.4      Radiologic Imaging: Per HPI   Assessment:  Valerie Case is a 71 y.o. female diagnosed with metastatic poorly differentiated endometrial adenocarcinoma (MSI-H) with right sidewall and pelvic lymph node involvement s/p palliative WPRT d/t bleeding with evidence of completed response based on imaging and doing well on maintenance pembrolizumab.   Initial performance status = 2 poor overall performance status; no longer in a wheel chair. Not optimal surgical  candidate but improving and may be able to consider in the future   Adrenal mass, uncertain etiology. Pheocromocytoma workup was negative. Biopsy recommended by urology but denied by IR. Radiology recommended adrenal MRI. Stable findings. DDX: lipid poor adenoma vs metastatic disease  History of symptomatic anemia due to bleeding and s/p blood transfusion and IV iron. Hb 11.2  Renal insufficiency (2.6 on 04/01/2021) recent creatinine 1.05  AODM, concern for suboptimal glycemic control. HbA1c 7.5 10/2021  Medical co-morbidities complicating care: HTN, AODM, depression, morbid obesity, Chronic venous insufficiency of both lower extremities with h/o cellulitis, History of asthma. Body mass index is 42.01 kg/m.  Plan:   Problem List Items Addressed This Visit       Genitourinary   Endometrial adenocarcinoma ([place]) - Primary (Chronic)     Other   Adrenal mass ([facility]) (Chronic)   Anemia due to antineoplastic chemotherapy (Chronic)   Morbid obesity with body mass index (BMI) of 40.0 to 44.9 in adult [facility]) (Chronic)    Excellent response to chemotherapy followed  by maintenance immunotherapy.  I defer to Dr. Tasia Catchings.  The benefit of surgery may be less than her risk given that she has had a complete response to therapy.    After reviewing her tumor testing we also recommended genetic counseling for genetic testing, she declined testing based on cost. She may reconsider at any time. I strongly recommended today that she pursue testing given her family and personal history.   Follow-up in 3 months for continued surveillance.  The patient's diagnosis, an outline of the further diagnostic and laboratory studies which will be required, the recommendation, and alternatives were discussed.  All questions were answered to the patient's satisfaction. Beckey Rutter, NP scribed the note.   We spent a total of at last 30 minutes were spent with the patient/family or documentation completion today; >50% was spent  in education, counseling and coordination of care for endometrial cancer. I personally reviewed her last imaging study with her an her daughter. They are very excited about the findings.   I personally had a face to face interaction and evaluated the patient; performed the physical exam, determined assessment and plan. Counseling was completed by me.    Rastus Borton Gaetana Michaelis, MD

## 2022-02-21 ENCOUNTER — Encounter: Payer: Self-pay | Admitting: Oncology

## 2022-02-21 ENCOUNTER — Inpatient Hospital Stay: Payer: Medicare PPO | Admitting: Oncology

## 2022-02-21 ENCOUNTER — Inpatient Hospital Stay: Payer: Medicare PPO

## 2022-02-21 ENCOUNTER — Inpatient Hospital Stay: Payer: Medicare PPO | Attending: Obstetrics and Gynecology

## 2022-02-21 VITALS — BP 155/58 | HR 97

## 2022-02-21 VITALS — BP 142/76 | HR 112 | Temp 98.2°F | Wt 278.3 lb

## 2022-02-21 DIAGNOSIS — Z923 Personal history of irradiation: Secondary | ICD-10-CM | POA: Diagnosis not present

## 2022-02-21 DIAGNOSIS — Z886 Allergy status to analgesic agent status: Secondary | ICD-10-CM | POA: Insufficient documentation

## 2022-02-21 DIAGNOSIS — Z86018 Personal history of other benign neoplasm: Secondary | ICD-10-CM | POA: Diagnosis not present

## 2022-02-21 DIAGNOSIS — K5730 Diverticulosis of large intestine without perforation or abscess without bleeding: Secondary | ICD-10-CM | POA: Insufficient documentation

## 2022-02-21 DIAGNOSIS — Z79899 Other long term (current) drug therapy: Secondary | ICD-10-CM | POA: Insufficient documentation

## 2022-02-21 DIAGNOSIS — Z801 Family history of malignant neoplasm of trachea, bronchus and lung: Secondary | ICD-10-CM | POA: Insufficient documentation

## 2022-02-21 DIAGNOSIS — T451X5A Adverse effect of antineoplastic and immunosuppressive drugs, initial encounter: Secondary | ICD-10-CM

## 2022-02-21 DIAGNOSIS — M2550 Pain in unspecified joint: Secondary | ICD-10-CM | POA: Diagnosis not present

## 2022-02-21 DIAGNOSIS — M47817 Spondylosis without myelopathy or radiculopathy, lumbosacral region: Secondary | ICD-10-CM | POA: Diagnosis not present

## 2022-02-21 DIAGNOSIS — Z803 Family history of malignant neoplasm of breast: Secondary | ICD-10-CM | POA: Diagnosis not present

## 2022-02-21 DIAGNOSIS — I2510 Atherosclerotic heart disease of native coronary artery without angina pectoris: Secondary | ICD-10-CM | POA: Insufficient documentation

## 2022-02-21 DIAGNOSIS — C541 Malignant neoplasm of endometrium: Secondary | ICD-10-CM

## 2022-02-21 DIAGNOSIS — D6481 Anemia due to antineoplastic chemotherapy: Secondary | ICD-10-CM

## 2022-02-21 DIAGNOSIS — Z809 Family history of malignant neoplasm, unspecified: Secondary | ICD-10-CM | POA: Insufficient documentation

## 2022-02-21 DIAGNOSIS — K760 Fatty (change of) liver, not elsewhere classified: Secondary | ICD-10-CM | POA: Diagnosis not present

## 2022-02-21 DIAGNOSIS — R5383 Other fatigue: Secondary | ICD-10-CM | POA: Diagnosis not present

## 2022-02-21 DIAGNOSIS — R609 Edema, unspecified: Secondary | ICD-10-CM | POA: Insufficient documentation

## 2022-02-21 DIAGNOSIS — K449 Diaphragmatic hernia without obstruction or gangrene: Secondary | ICD-10-CM | POA: Insufficient documentation

## 2022-02-21 DIAGNOSIS — R160 Hepatomegaly, not elsewhere classified: Secondary | ICD-10-CM | POA: Insufficient documentation

## 2022-02-21 DIAGNOSIS — Z5112 Encounter for antineoplastic immunotherapy: Secondary | ICD-10-CM | POA: Diagnosis present

## 2022-02-21 DIAGNOSIS — I700 Atherosclerosis of aorta: Secondary | ICD-10-CM | POA: Diagnosis not present

## 2022-02-21 LAB — CBC WITH DIFFERENTIAL/PLATELET
Abs Immature Granulocytes: 0.04 10*3/uL (ref 0.00–0.07)
Basophils Absolute: 0 10*3/uL (ref 0.0–0.1)
Basophils Relative: 1 %
Eosinophils Absolute: 0.6 10*3/uL — ABNORMAL HIGH (ref 0.0–0.5)
Eosinophils Relative: 10 %
HCT: 35.2 % — ABNORMAL LOW (ref 36.0–46.0)
Hemoglobin: 11.1 g/dL — ABNORMAL LOW (ref 12.0–15.0)
Immature Granulocytes: 1 %
Lymphocytes Relative: 16 %
Lymphs Abs: 0.9 10*3/uL (ref 0.7–4.0)
MCH: 31.2 pg (ref 26.0–34.0)
MCHC: 31.5 g/dL (ref 30.0–36.0)
MCV: 98.9 fL (ref 80.0–100.0)
Monocytes Absolute: 0.6 10*3/uL (ref 0.1–1.0)
Monocytes Relative: 10 %
Neutro Abs: 3.8 10*3/uL (ref 1.7–7.7)
Neutrophils Relative %: 62 %
Platelets: 248 10*3/uL (ref 150–400)
RBC: 3.56 MIL/uL — ABNORMAL LOW (ref 3.87–5.11)
RDW: 13.7 % (ref 11.5–15.5)
WBC: 6 10*3/uL (ref 4.0–10.5)
nRBC: 0 % (ref 0.0–0.2)

## 2022-02-21 LAB — COMPREHENSIVE METABOLIC PANEL
ALT: 14 U/L (ref 0–44)
AST: 21 U/L (ref 15–41)
Albumin: 3.8 g/dL (ref 3.5–5.0)
Alkaline Phosphatase: 68 U/L (ref 38–126)
Anion gap: 10 (ref 5–15)
BUN: 26 mg/dL — ABNORMAL HIGH (ref 8–23)
CO2: 22 mmol/L (ref 22–32)
Calcium: 9.8 mg/dL (ref 8.9–10.3)
Chloride: 108 mmol/L (ref 98–111)
Creatinine, Ser: 0.91 mg/dL (ref 0.44–1.00)
GFR, Estimated: >60 mL/min (ref >60)
Glucose, Bld: 170 mg/dL — ABNORMAL HIGH (ref 70–99)
Potassium: 4.2 mmol/L (ref 3.5–5.1)
Sodium: 140 mmol/L (ref 135–145)
Total Bilirubin: 0.3 mg/dL (ref 0.3–1.2)
Total Protein: 7.3 g/dL (ref 6.5–8.1)

## 2022-02-21 MED ORDER — SODIUM CHLORIDE 0.9 % IV SOLN
Freq: Once | INTRAVENOUS | Status: AC
  Filled 2022-02-21: qty 250

## 2022-02-21 MED ORDER — HEPARIN SOD (PORK) LOCK FLUSH 100 UNIT/ML IV SOLN
INTRAVENOUS | Status: AC
  Administered 2022-02-21: 500 [IU]
  Filled 2022-02-21: qty 5

## 2022-02-21 MED ORDER — HEPARIN SOD (PORK) LOCK FLUSH 100 UNIT/ML IV SOLN
500.0000 [IU] | Freq: Once | INTRAVENOUS | Status: AC | PRN
  Filled 2022-02-21: qty 5

## 2022-02-21 MED ORDER — SODIUM CHLORIDE 0.9 % IV SOLN
200.0000 mg | Freq: Once | INTRAVENOUS | Status: AC
  Administered 2022-02-21: 200 mg via INTRAVENOUS
  Filled 2022-02-21: qty 8

## 2022-02-21 NOTE — Assessment & Plan Note (Addendum)
s/p carboplatin and Taxol for 5 cycles, on Keytruda maintenance.  Labs are reviewed and discussed with patient. Proceed with maintenance.  Keytruda  CT showed NED

## 2022-02-21 NOTE — Progress Notes (Signed)
Hematology/Oncology Progress note [phone] [phone]         Patient Care Team: Tracie Harrier, MD as PCP - General (Internal Medicine)   CHIEF COMPLAINTS/REASON FOR VISIT:  Follow up for endometrial cancer  ASSESSMENT & PLAN:   Cancer Staging  Endometrial adenocarcinoma [facility]) Staging form: Corpus Uteri - Carcinoma and Carcinosarcoma, AJCC 8th Edition - Clinical stage from 04/10/2021: FIGO Stage III, calculated as Stage Unknown (cT3, cNX, cM0) - Signed by Earlie Server, MD on 08/24/2021   Endometrial adenocarcinoma ([place]) s/p carboplatin and Taxol for 5 cycles, on Keytruda maintenance.  Labs are reviewed and discussed with patient. Proceed with maintenance.  Keytruda  CT showed NED   Encounter for antineoplastic immunotherapy Treatment plan as listed above.   Anemia due to antineoplastic chemotherapy She is currently on immunotherapy.  Hemoglobin improved.    No orders of the defined types were placed in this encounter.   Return of visit:  3 weeks Lab MD Octaviano Batty, MD, [name] [facility] Hematology Oncology 02/21/2022     HISTORY OF PRESENTING ILLNESS:   AHSLEY Case is a  71 y.o.  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 Oncology History  Endometrial adenocarcinoma ([place])  12/12/2020 Imaging   12/12/2020, CT abdomen pelvis with contrast showed no radiographic evidence of urinary tract neoplasm, calculi, hydronephrosis.  2.6 cm nonspecific left adrenal mass. 02/12/2021 CT hematuria work-up showed small uterine fibroids, no findings to explain hematuria.Hepatomegaly, diverticulosis without evidence of diverticulitis, Coronary artery disease   04/01/2021 -  Hospital Admission   04/01/2021 - 04/03/2021, patient was hospitalized due to symptomatic anemia, hemoglobin 6.9, heavy postmenopausal bleeding with passing large clots.  She also presented with increased creatinine level  to 2.58 compared to her baseline level of 0.9 in November 2022.  Patient received IV iron infusion and 2 units of PRBC during the hospital stay..  04/02/2021, iron panel showed iron saturation 37, ferritin 37, TIBC 333-the studies were done after patient received blood transfusion.  At discharge, hemoglobin was 7.7.   04/01/2021 Initial Diagnosis   04/01/2020, endometrial biopsy showed poorly differentiated endometrial adenocarcinoma. Omniseq NGS showed TMB 48.4 mt/mb [high], MSI High, PD-L1-TPS <1%, PIK3CA U2353I, Negative for BRAF, HER2, NTRK1 RET.     04/10/2021 Cancer Staging   Staging form: Corpus Uteri - Carcinoma and Carcinosarcoma, AJCC 8th Edition - Clinical stage from 04/10/2021: FIGO Stage III, calculated as Stage Unknown (cT3, cNX, cM0) - Signed by Earlie Server, MD on 08/24/2021 Stage prefix: Initial diagnosis   04/15/2021 Imaging   PET showed 1. Large hypermetabolic endometrial mass consistent with known endometrial cancer. Suspect direct invasion/involvement of the right adnexa. Right pelvic sidewall hypermetabolic adenopathy.2. 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 3. No findings for metastatic disease involving the chest or bonystructures.     04/24/2021 - 05/29/2021 Radiation Therapy   status post radiation to pelvis   08/05/2021 Imaging   MRI abdomen w wo contrast 1. Stable solid enhancing 2.7 cm left adrenal lesion, which was hypermetabolic on prior PET/CTs but is unchanged in size dating back to December 12, 2020. 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 However, unfortunately metastatic disease can not be entirely excluded and remains a pertinent differential consideration. Comparison with  more remote prior imaging would  be the most valuable tool in the assessment of this lesion, as demonstrating long-term stability would indicate this to be a benign lesion. However, if no prior imaging can be made available, would consider follow-up adrenal protocol CT with and without intravenous contrast material in 3 months as this would allow for further assessment of stability as well as enhancement and washout characteristics of the lesion potentially allowing for better characterization versus direct tissue sampling.2. Hepatomegaly and hepatic steatosis.3. Colonic diverticulosis without findings of acute diverticulitis    08/16/2021 - 09/27/2021 Chemotherapy   UTERINE Carboplatin AUC 5 / Paclitaxel q21d x 3      10/03/2021 Imaging   PET 1. No residual hypermetabolism in the uterus suggesting an excellent response to treatment. No findings for metastatic disease. 2. Resolution of hypermetabolism in the left adrenal gland lesion suggesting this was metastatic disease. 3. Diffuse marrow hypermetabolism likely due to rebound from chemotherapy or marrow stimulating drugs   10/18/2021 - 11/08/2021 Chemotherapy   AUC 5 / Paclitaxel/ Keytruda Q21d  x 2 cycles   11/29/2021 -  Chemotherapy   Patient is on Treatment Plan : UTERINE Pembrolizumab (200) q21d         INTERVAL HISTORY Valerie Case is a 71 y.o. female who has above history reviewed by me today presents for follow up visit for anemia and endometrial cancer Overall she tolerates treatment, No new complaints.  Patient denies nausea vomiting diarrhea.  Review of Systems  Constitutional:  Positive for fatigue. Negative for chills and fever.  HENT:   Negative for hearing loss and voice change.   Eyes:  Negative for eye problems.  Respiratory:  Negative for chest tightness and cough.   Cardiovascular:  Negative for chest pain.  Gastrointestinal:  Negative for abdominal distention, abdominal pain and blood in stool.  Endocrine: Negative for hot flashes.   Genitourinary:  Negative for difficulty urinating, frequency and vaginal bleeding.   Musculoskeletal:  Positive for arthralgias.  Skin:  Negative for itching and rash.  Neurological:  Negative for extremity weakness.  Hematological:  Negative for adenopathy.  Psychiatric/Behavioral:  Negative for confusion.     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    SURGICAL HISTORY: Past Surgical History:  Procedure Laterality Date   CESAREAN SECTION  01/23/1979   IR IMAGING GUIDED PORT INSERTION  08/09/2021   TONSILLECTOMY  03/17/1956    SOCIAL HISTORY: Social History   Socioeconomic History   Marital status: Divorced    Spouse name: Not on file   Number of children: Not on file   Years of education: Not on file   Highest education level: Not on file  Occupational History   Not on file  Tobacco Use   Smoking status: Never   Smokeless tobacco: Never  Vaping Use   Vaping Use: Never used  Substance and Sexual Activity   Alcohol use: Not Currently   Drug use: Never   Sexual activity: Not Currently  Other Topics Concern   Not on file  Social History Narrative      Social Determinants of Health   Financial Resource Strain: Low Risk  (09/27/2021)   Overall Emergency planning/management officer Strain (  CARDIA)    Difficulty of Paying Living Expenses: Not very hard  Food Insecurity: No Food Insecurity (09/27/2021)   Hunger Vital Sign    Worried About Running Out of Food in the Last Year: Never true    Ran Out of Food in the Last Year: Never true  Transportation Needs: No Transportation Needs (09/27/2021)   PRAPARE - Hydrologist (Medical): No    Lack of Transportation (Non-Medical): No  Physical  Activity: Inactive (09/27/2021)   Exercise Vital Sign    Days of Exercise per Week: 0 days    Minutes of Exercise per Session: 0 min  Stress: Stress Concern Present (09/27/2021)   [place]    Feeling of Stress : Rather much  Social Connections: Moderately Integrated (09/27/2021)   Social Connection and Isolation Panel [NHANES]    Frequency of Communication with Friends and Family: Three times a week    Frequency of Social Gatherings with Friends and Family: Three times a week    Attends Religious Services: 1 to 4 times per year    Active Member of Clubs or Organizations: No    Attends Archivist Meetings: 1 to 4 times per year    Marital Status: Divorced  Intimate Partner Violence: Not At Risk (09/27/2021)   Humiliation, Afraid, Rape, and Kick questionnaire    Fear of Current or Ex-Partner: No    Emotionally Abused: No    Physically Abused: No    Sexually Abused: No    FAMILY HISTORY: Family History  Problem Relation Age of Onset   Breast cancer Mother 59   Cancer Mother    Cancer Father    Lung cancer Father    Breast cancer Cousin        dx 24s maternal    ALLERGIES:  is allergic to aspirin.  MEDICATIONS:  Current Outpatient Medications  Medication Sig Dispense Refill   acetaminophen (TYLENOL) 650 MG CR tablet Take 1,300 mg by mouth every 8 (eight) hours as needed for pain.     albuterol (VENTOLIN HFA) 108 (90 Base) MCG/ACT inhaler Inhale 2 puffs into the lungs every 6 (six) hours as needed for wheezing or shortness of breath.     Cranberry-Vitamin C-Probiotic (AZO CRANBERRY PO) Take by mouth.     docusate sodium (COLACE) 100 MG capsule Take 1 capsule (100 mg total) by mouth 2 (two) times daily. 60 capsule 1   empagliflozin (JARDIANCE) 25 MG TABS tablet Take 25 mg by mouth daily.     ferrous sulfate 325 (65 FE) MG EC tablet Take 325 mg by mouth daily with breakfast.     FLUoxetine (PROZAC) 10 MG  capsule Take 10 mg by mouth daily.     fluticasone (FLONASE) 50 MCG/ACT nasal spray Place 1 spray into both nostrils daily as needed for allergies or rhinitis.     hydrochlorothiazide (HYDRODIURIL) 12.5 MG tablet Take 12.5 mg by mouth daily.     hydrocortisone 2.5 % cream Apply topically 2 (two) times daily. 30 g 2   ibuprofen (ADVIL) 200 MG tablet Take 400 mg by mouth every 8 (eight) hours as needed for moderate pain.     lidocaine-prilocaine (EMLA) cream Apply to affected area once 30 g 3   lisinopril (ZESTRIL) 40 MG tablet Take 40 mg by mouth at bedtime.     LORazepam (ATIVAN) 0.5 MG tablet Take 1 tablet (0.5 mg total) by mouth every 8 (eight) hours as  needed for anxiety (nausea vomiting). 30 tablet 0   metFORMIN (GLUCOPHAGE-XR) 500 MG 24 hr tablet Take 1,000 mg by mouth at bedtime.     metoprolol succinate (TOPROL-XL) 25 MG 24 hr tablet Take 25 mg by mouth daily.     NOVOLIN 70/30 FLEXPEN (70-30) 100 UNIT/ML KwikPen 50-110 Units See admin instructions. 50 units in the morning, 110 units at bedtime     ondansetron (ZOFRAN) 8 MG tablet Take 1 tablet (8 mg total) by mouth 2 (two) times daily as needed. Start on the third day after chemotherapy. 30 tablet 1   OZEMPIC, 1 MG/DOSE, 4 MG/3ML SOPN Inject 1 mg into the skin every Tuesday.     prochlorperazine (COMPAZINE) 10 MG tablet Take 1 tablet (10 mg total) by mouth every 6 (six) hours as needed (Nausea or vomiting). 30 tablet 1   rosuvastatin (CRESTOR) 10 MG tablet Take 10 mg by mouth daily.     traMADol (ULTRAM) 50 MG tablet Take 2 tablets (100 mg total) by mouth every 6 (six) hours as needed. 90 tablet 0   potassium chloride SA (KLOR-CON M) 20 MEQ tablet Take 1 tablet (20 mEq total) by mouth daily. (Patient not taking: Reported on 01/31/2022) 3 tablet 0   No current facility-administered medications for this visit.   Facility-Administered Medications Ordered in Other Visits  Medication Dose Route Frequency Provider Last Rate Last Admin    heparin lock flush 100 UNIT/ML injection              PHYSICAL EXAMINATION: ECOG PERFORMANCE STATUS: 1 - Symptomatic but completely ambulatory  Physical Exam Constitutional:      General: She is not in acute distress.    Appearance: She is obese.  HENT:     Head: Normocephalic and atraumatic.  Eyes:     General: No scleral icterus. Cardiovascular:     Rate and Rhythm: Normal rate and regular rhythm.     Heart sounds: Normal heart sounds.  Pulmonary:     Effort: Pulmonary effort is normal. No respiratory distress.     Breath sounds: No wheezing.  Abdominal:     General: Bowel sounds are normal. There is no distension.     Palpations: Abdomen is soft.  Musculoskeletal:        General: No deformity. Normal range of motion.     Cervical back: Normal range of motion and neck supple.     Right lower leg: Edema present.     Left lower leg: Edema present.     Comments:    Skin:    General: Skin is warm and dry.     Findings: No erythema or rash.     Comments: Chronic venous sufficiency skin changes  Neurological:     Mental Status: She is alert and oriented to person, place, and time. Mental status is at baseline.     Cranial Nerves: No cranial nerve deficit.     Coordination: Coordination normal.  Psychiatric:        Mood and Affect: Mood normal.      LABORATORY DATA:  I have reviewed the data as listed    Latest Ref Rng & Units 02/21/2022    8:12 AM 01/31/2022    8:02 AM 01/10/2022    8:11 AM  CBC  WBC 4.0 - 10.5 K/uL 6.0  7.8  8.4   Hemoglobin 12.0 - 15.0 g/dL 11.1  11.2  10.8   Hematocrit 36.0 - 46.0 % 35.2  35.2  34.0  Platelets 150 - 400 K/uL 248  250  251       Latest Ref Rng & Units 02/21/2022    8:12 AM 01/31/2022    8:02 AM 01/10/2022    8:11 AM  CMP  Glucose 70 - 99 mg/dL 170  188  179   BUN 8 - 23 mg/dL 26  26  33   Creatinine 0.44 - 1.00 mg/dL 0.91  1.05  1.01   Sodium 135 - 145 mmol/L 140  138  139   Potassium 3.5 - 5.1 mmol/L 4.2  4.1  3.9    Chloride 98 - 111 mmol/L 108  106  109   CO2 22 - 32 mmol/L _0 Calcium 8.9 - 10.3 mg/dL 9.8  9.8  9.7   Total Protein 6.5 - 8.1 g/dL 7.3  7.5  7.1   Total Bilirubin 0.3 - 1.2 mg/dL 0.3  0.6  0.1   Alkaline Phos 38 - 126 U/L 68  59  63   AST 15 - 41 U/L _1 ALT 0 - 44 U/L _2 RADIOGRAPHIC STUDIES: I have personally reviewed the radiological images as listed and agreed with the findings in the report. CT CHEST ABDOMEN PELVIS W CONTRAST  Result Date: 01/24/2022 CLINICAL DATA:  Follow-up of endometrial carcinoma. * Tracking Code: BO * EXAM: CT CHEST, ABDOMEN, AND PELVIS WITH CONTRAST TECHNIQUE: Multidetector CT imaging of the chest, abdomen and pelvis was performed following the standard protocol during bolus administration of intravenous contrast. RADIATION DOSE REDUCTION: This exam was performed according to the departmental dose-optimization program which includes automated exposure control, adjustment of the mA and/or kV according to patient size and/or use of iterative reconstruction technique. CONTRAST:  178m OMNIPAQUE IOHEXOL 300 MG/ML  SOLN COMPARISON:  10/03/2021 PET.  Abdominal MRI 08/05/2021. FINDINGS: CT CHEST FINDINGS Cardiovascular: Right Port-A-Cath tip, mid right atrium. Aortic atherosclerosis. Moderate cardiomegaly, without pericardial effusion. Three vessel coronary artery calcification. No central pulmonary embolism, on this non-dedicated study. Mediastinum/Nodes: No supraclavicular adenopathy. No mediastinal or hilar adenopathy. Tiny hiatal hernia. Lungs/Pleura: No pleural fluid.  Clear lungs. Musculoskeletal: No acute osseous abnormality. Presumed sebaceous cyst about the left paramidline chest wall of 1.0 cm on 02/03. CT ABDOMEN PELVIS FINDINGS Hepatobiliary: Hepatomegaly at 22.4 cm. Similar hepatic cysts and too small to characterize lesions. Normal gallbladder, without biliary ductal dilatation. Pancreas: Normal, without mass or ductal  dilatation. Spleen: Normal in size, without focal abnormality. Adrenals/Urinary Tract: Left adrenal nodule of 2.7 by 2.3 cm is similar including back to 02/12/2021. Normal right adrenal gland and kidney. Left renal interpolar lesion favored to represent a minimally complex cyst, including at up to 25 HU, when compared to the prior noncontrast CT from PET . In the absence of clinically indicated signs/symptoms require(s) no independent follow-up. Normal urinary bladder. Stomach/Bowel: Normal remainder of the stomach. Transverse duodenal diverticulum. Otherwise normal small bowel. Extensive colonic diverticulosis. Normal terminal ileum. Appendix not visualized. Vascular/Lymphatic: Aortic atherosclerosis. No abdominopelvic adenopathy. Reproductive: No residual uterine mass, given mild limitations of patient body habitus. Subtle foci of gas within the lower uterine segment and cervix including on 107 and 110 of series 3 are nonspecific, present on prior PET. No adnexal mass. Other: No significant free fluid. Mild pelvic floor laxity. No evidence of omental or peritoneal disease. Musculoskeletal: Lumbosacral spondylosis with grade 1 L4-5 anterolisthesis. IMPRESSION: 1. No findings of progressive metastatic disease within  the chest, abdomen, or pelvis. 2. Left adrenal nodule is similar in size to 10/03/2021 PET. This was felt to be suspicious for treated metastasis on prior PET. 3. Hepatomegaly 4. Coronary artery atherosclerosis. Aortic Atherosclerosis (ICD10-I70.0). 5.  Tiny hiatal hernia. Electronically Signed   By: Abigail Miyamoto M.D.   On: 01/24/2022 14:00

## 2022-02-21 NOTE — Patient Instructions (Signed)
[facility]  Discharge Instructions: Thank you for choosing [facility] to provide your oncology and hematology care.  If you have a lab appointment with the Cancer Center, please go directly to the Cancer Center and check in at the registration area.  Wear comfortable clothing and clothing appropriate for easy access to any Portacath or PICC line.   We strive to give you quality time with your provider. You may need to reschedule your appointment if you arrive late (15 or more minutes).  Arriving late affects you and other patients whose appointments are after yours.  Also, if you miss three or more appointments without notifying the office, you may be dismissed from the clinic at the provider's discretion.      For prescription refill requests, have your pharmacy contact our office and allow 72 hours for refills to be completed.    Today you received the following chemotherapy and/or immunotherapy agents Keytruda      To help prevent nausea and vomiting after your treatment, we encourage you to take your nausea medication as directed.  BELOW ARE SYMPTOMS THAT SHOULD BE REPORTED IMMEDIATELY: *FEVER GREATER THAN 100.4 F (38 C) OR HIGHER *CHILLS OR SWEATING *NAUSEA AND VOMITING THAT IS NOT CONTROLLED WITH YOUR NAUSEA MEDICATION *UNUSUAL SHORTNESS OF BREATH *UNUSUAL BRUISING OR BLEEDING *URINARY PROBLEMS (pain or burning when urinating, or frequent urination) *BOWEL PROBLEMS (unusual diarrhea, constipation, pain near the anus) TENDERNESS IN MOUTH AND THROAT WITH OR WITHOUT PRESENCE OF ULCERS (sore throat, sores in mouth, or a toothache) UNUSUAL RASH, SWELLING OR PAIN  UNUSUAL VAGINAL DISCHARGE OR ITCHING   Items with * indicate a potential emergency and should be followed up as soon as possible or go to the Emergency Department if any problems should occur.  Please show the CHEMOTHERAPY ALERT CARD or IMMUNOTHERAPY ALERT CARD at check-in to  the Emergency Department and triage nurse.  Should you have questions after your visit or need to cancel or reschedule your appointment, please contact [facility]  [phone] and follow the prompts.  Office hours are 8:00 a.m. to 4:30 p.m. Monday - Friday. Please note that voicemails left after 4:00 p.m. may not be returned until the following business day.  We are closed weekends and major holidays. You have access to a nurse at all times for urgent questions. Please call the main number to the clinic [phone] and follow the prompts.  For any non-urgent questions, you may also contact your provider using MyChart. We now offer e-Visits for anyone 18 and older to request care online for non-urgent symptoms. For details visit [email or web address].   Also download the MyChart app! Go to the app store, search "MyChart", open the app, select [facility], and log in with your MyChart username and password.  Masks are optional in the cancer centers. If you would like for your care team to wear a mask while they are taking care of you, please let them know. For doctor visits, patients may have with them one support person who is at least 70 years old. At this time, visitors are not allowed in the infusion area.   

## 2022-02-21 NOTE — Assessment & Plan Note (Signed)
She is currently on immunotherapy.  Hemoglobin improved.  

## 2022-02-21 NOTE — Assessment & Plan Note (Signed)
Treatment plan as listed above. 

## 2022-03-14 ENCOUNTER — Inpatient Hospital Stay: Payer: Medicare PPO

## 2022-03-14 ENCOUNTER — Inpatient Hospital Stay (HOSPITAL_BASED_OUTPATIENT_CLINIC_OR_DEPARTMENT_OTHER): Payer: Medicare PPO | Admitting: Oncology

## 2022-03-14 ENCOUNTER — Encounter: Payer: Self-pay | Admitting: Oncology

## 2022-03-14 VITALS — BP 136/74 | HR 101 | Temp 98.1°F | Resp 18 | Wt 276.7 lb

## 2022-03-14 DIAGNOSIS — T451X5A Adverse effect of antineoplastic and immunosuppressive drugs, initial encounter: Secondary | ICD-10-CM

## 2022-03-14 DIAGNOSIS — C541 Malignant neoplasm of endometrium: Secondary | ICD-10-CM

## 2022-03-14 DIAGNOSIS — D6481 Anemia due to antineoplastic chemotherapy: Secondary | ICD-10-CM

## 2022-03-14 DIAGNOSIS — Z5112 Encounter for antineoplastic immunotherapy: Secondary | ICD-10-CM | POA: Diagnosis not present

## 2022-03-14 DIAGNOSIS — E278 Other specified disorders of adrenal gland: Secondary | ICD-10-CM | POA: Diagnosis not present

## 2022-03-14 LAB — CBC WITH DIFFERENTIAL/PLATELET
Abs Immature Granulocytes: 0.05 10*3/uL (ref 0.00–0.07)
Basophils Absolute: 0 10*3/uL (ref 0.0–0.1)
Basophils Relative: 1 %
Eosinophils Absolute: 0.4 10*3/uL (ref 0.0–0.5)
Eosinophils Relative: 6 %
HCT: 33.6 % — ABNORMAL LOW (ref 36.0–46.0)
Hemoglobin: 10.9 g/dL — ABNORMAL LOW (ref 12.0–15.0)
Immature Granulocytes: 1 %
Lymphocytes Relative: 16 %
Lymphs Abs: 1 10*3/uL (ref 0.7–4.0)
MCH: 31.1 pg (ref 26.0–34.0)
MCHC: 32.4 g/dL (ref 30.0–36.0)
MCV: 96 fL (ref 80.0–100.0)
Monocytes Absolute: 0.7 10*3/uL (ref 0.1–1.0)
Monocytes Relative: 10 %
Neutro Abs: 4.5 10*3/uL (ref 1.7–7.7)
Neutrophils Relative %: 66 %
Platelets: 261 10*3/uL (ref 150–400)
RBC: 3.5 MIL/uL — ABNORMAL LOW (ref 3.87–5.11)
RDW: 13.8 % (ref 11.5–15.5)
WBC: 6.6 10*3/uL (ref 4.0–10.5)
nRBC: 0 % (ref 0.0–0.2)

## 2022-03-14 LAB — COMPREHENSIVE METABOLIC PANEL
ALT: 16 U/L (ref 0–44)
AST: 24 U/L (ref 15–41)
Albumin: 3.7 g/dL (ref 3.5–5.0)
Alkaline Phosphatase: 62 U/L (ref 38–126)
Anion gap: 10 (ref 5–15)
BUN: 34 mg/dL — ABNORMAL HIGH (ref 8–23)
CO2: 21 mmol/L — ABNORMAL LOW (ref 22–32)
Calcium: 9.9 mg/dL (ref 8.9–10.3)
Chloride: 108 mmol/L (ref 98–111)
Creatinine, Ser: 1.24 mg/dL — ABNORMAL HIGH (ref 0.44–1.00)
GFR, Estimated: 47 mL/min — ABNORMAL LOW (ref >60)
Glucose, Bld: 200 mg/dL — ABNORMAL HIGH (ref 70–99)
Potassium: 4.2 mmol/L (ref 3.5–5.1)
Sodium: 139 mmol/L (ref 135–145)
Total Bilirubin: 0.3 mg/dL (ref 0.3–1.2)
Total Protein: 7.4 g/dL (ref 6.5–8.1)

## 2022-03-14 LAB — TSH: TSH: 1.863 u[IU]/mL (ref 0.350–4.500)

## 2022-03-14 MED ORDER — SODIUM CHLORIDE 0.9 % IV SOLN
200.0000 mg | Freq: Once | INTRAVENOUS | Status: AC
  Administered 2022-03-14: 200 mg via INTRAVENOUS
  Filled 2022-03-14: qty 8

## 2022-03-14 MED ORDER — SODIUM CHLORIDE 0.9% FLUSH
10.0000 mL | INTRAVENOUS | Status: DC | PRN
  Administered 2022-03-14: 10 mL
  Filled 2022-03-14: qty 10

## 2022-03-14 MED ORDER — HEPARIN SOD (PORK) LOCK FLUSH 100 UNIT/ML IV SOLN
500.0000 [IU] | Freq: Once | INTRAVENOUS | Status: AC | PRN
  Administered 2022-03-14: 500 [IU]
  Filled 2022-03-14: qty 5

## 2022-03-14 MED ORDER — SODIUM CHLORIDE 0.9 % IV SOLN
Freq: Once | INTRAVENOUS | Status: AC
  Filled 2022-03-14: qty 250

## 2022-03-14 NOTE — Assessment & Plan Note (Signed)
She is currently on immunotherapy.  Hemoglobin improved.  

## 2022-03-14 NOTE — Progress Notes (Signed)
Hematology/Oncology Progress note [phone] [phone]         Patient Care Team: Tracie Harrier, MD as PCP - General (Internal Medicine)   CHIEF COMPLAINTS/REASON FOR VISIT:  Follow up for endometrial cancer  ASSESSMENT & PLAN:   Cancer Staging  Endometrial adenocarcinoma [facility]) Staging form: Corpus Uteri - Carcinoma and Carcinosarcoma, AJCC 8th Edition - Clinical stage from 04/10/2021: FIGO Stage III, calculated as Stage Unknown (cT3, cNX, cM0) - Signed by Earlie Server, MD on 08/24/2021   Endometrial adenocarcinoma ([place]) s/p carboplatin and Taxol for 5 cycles, on Keytruda maintenance.  CT showed NED Labs are reviewed and discussed with patient. Proceed with maintenance.  Keytruda    Adrenal mass ([place]) lipid poor hyperfunctioning adenoma vs mets.  FDG avid on PET scan.  Difficult biopsy due to patient's body habitus and deep position of the mass. Resolved on PET scan, suggesting that this lesion might be a metastatic lesion. Continue monitor   Anemia due to antineoplastic chemotherapy She is currently on immunotherapy.  Hemoglobin improved.   Encounter for antineoplastic immunotherapy Treatment plan as listed above.    No orders of the defined types were placed in this encounter.   Return of visit:  3 weeks Lab MD Octaviano Batty, MD, [name] [facility] Hematology Oncology 03/14/2022     HISTORY OF PRESENTING ILLNESS:   Valerie Case is a  71 y.o.  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 Oncology History  Endometrial adenocarcinoma ([place])  12/12/2020 Imaging   12/12/2020, CT abdomen pelvis with contrast showed no radiographic evidence of urinary tract neoplasm, calculi, hydronephrosis.  2.6 cm nonspecific left adrenal mass. 02/12/2021 CT hematuria work-up showed small uterine fibroids, no findings to explain hematuria.Hepatomegaly, diverticulosis without evidence of  diverticulitis, Coronary artery disease   04/01/2021 -  Hospital Admission   04/01/2021 - 04/03/2021, patient was hospitalized due to symptomatic anemia, hemoglobin 6.9, heavy postmenopausal bleeding with passing large clots.  She also presented with increased creatinine level to 2.58 compared to her baseline level of 0.9 in November 2022.  Patient received IV iron infusion and 2 units of PRBC during the hospital stay..  04/02/2021, iron panel showed iron saturation 37, ferritin 37, TIBC 333-the studies were done after patient received blood transfusion.  At discharge, hemoglobin was 7.7.   04/01/2021 Initial Diagnosis   04/01/2020, endometrial biopsy showed poorly differentiated endometrial adenocarcinoma. Omniseq NGS showed TMB 48.4 mt/mb [high], MSI High, PD-L1-TPS <1%, PIK3CA F6433I, Negative for BRAF, HER2, NTRK1 RET.     04/10/2021 Cancer Staging   Staging form: Corpus Uteri - Carcinoma and Carcinosarcoma, AJCC 8th Edition - Clinical stage from 04/10/2021: FIGO Stage III, calculated as Stage Unknown (cT3, cNX, cM0) - Signed by Earlie Server, MD on 08/24/2021 Stage prefix: Initial diagnosis   04/15/2021 Imaging   PET showed 1. Large hypermetabolic endometrial mass consistent with known endometrial cancer. Suspect direct invasion/involvement of the right adnexa. Right pelvic sidewall hypermetabolic adenopathy.2. 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 3. No findings for metastatic disease involving the chest or bonystructures.     04/24/2021 - 05/29/2021 Radiation Therapy   status post radiation to pelvis   08/05/2021 Imaging   MRI abdomen w wo contrast 1. Stable solid enhancing 2.7 cm left adrenal lesion, which was hypermetabolic on prior PET/CTs but is unchanged in size dating back to December 12, 2020. While the  imaging characteristics are again nonspecific, given its relative stability since September  2022 and the relative rarity of isolated adrenal metastases this is favored to reflect a lipid poor adenoma. However, unfortunately metastatic disease can not be entirely excluded and remains a pertinent differential consideration. Comparison with more remote prior imaging would be the most valuable tool in the assessment of this lesion, as demonstrating long-term stability would indicate this to be a benign lesion. However, if no prior imaging can be made available, would consider follow-up adrenal protocol CT with and without intravenous contrast material in 3 months as this would allow for further assessment of stability as well as enhancement and washout characteristics of the lesion potentially allowing for better characterization versus direct tissue sampling.2. Hepatomegaly and hepatic steatosis.3. Colonic diverticulosis without findings of acute diverticulitis    08/16/2021 - 09/27/2021 Chemotherapy   UTERINE Carboplatin AUC 5 / Paclitaxel q21d x 3      10/03/2021 Imaging   PET 1. No residual hypermetabolism in the uterus suggesting an excellent response to treatment. No findings for metastatic disease. 2. Resolution of hypermetabolism in the left adrenal gland lesion suggesting this was metastatic disease. 3. Diffuse marrow hypermetabolism likely due to rebound from chemotherapy or marrow stimulating drugs   10/18/2021 - 11/08/2021 Chemotherapy   AUC 5 / Paclitaxel/ Keytruda Q21d  x 2 cycles   11/29/2021 -  Chemotherapy   Patient is on Treatment Plan : UTERINE Pembrolizumab (200) q21d         INTERVAL HISTORY Valerie Case is a 71 y.o. female who has above history reviewed by me today presents for follow up visit for anemia and endometrial cancer Overall she tolerates treatment, No new complaints.  Patient denies nausea vomiting diarrhea.  Review of Systems  Constitutional:  Positive for fatigue. Negative for chills and fever.  HENT:   Negative for hearing loss and voice change.    Eyes:  Negative for eye problems.  Respiratory:  Negative for chest tightness and cough.   Cardiovascular:  Negative for chest pain.  Gastrointestinal:  Negative for abdominal distention, abdominal pain and blood in stool.  Endocrine: Negative for hot flashes.  Genitourinary:  Negative for difficulty urinating, frequency and vaginal bleeding.   Musculoskeletal:  Positive for arthralgias.  Skin:  Negative for itching and rash.  Neurological:  Negative for extremity weakness.  Hematological:  Negative for adenopathy.  Psychiatric/Behavioral:  Negative for confusion.     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    SURGICAL HISTORY: Past Surgical History:  Procedure Laterality Date   CESAREAN SECTION  01/23/1979   IR IMAGING GUIDED PORT INSERTION  08/09/2021   TONSILLECTOMY  03/17/1956    SOCIAL HISTORY: Social History   Socioeconomic History   Marital status: Divorced    Spouse name: Not on file   Number of children: Not on file   Years of education: Not on file   Highest education level: Not on file  Occupational History   Not on file  Tobacco Use   Smoking status: Never   Smokeless tobacco: Never  Vaping Use   Vaping Use: Never used  Substance and Sexual Activity   Alcohol use: Not Currently  Drug use: Never   Sexual activity: Not Currently  Other Topics Concern   Not on file  Social History Narrative      Social Determinants of Health   Financial Resource Strain: Low Risk  (09/27/2021)   Overall Financial Resource Strain (CARDIA)    Difficulty of Paying Living Expenses: Not very hard  Food Insecurity: No Food Insecurity (09/27/2021)   Hunger Vital Sign    Worried About Running Out of  Food in the Last Year: Never true    Ran Out of Food in the Last Year: Never true  Transportation Needs: No Transportation Needs (09/27/2021)   PRAPARE - Hydrologist (Medical): No    Lack of Transportation (Non-Medical): No  Physical Activity: Inactive (09/27/2021)   Exercise Vital Sign    Days of Exercise per Week: 0 days    Minutes of Exercise per Session: 0 min  Stress: Stress Concern Present (09/27/2021)   [place]    Feeling of Stress : Rather much  Social Connections: Moderately Integrated (09/27/2021)   Social Connection and Isolation Panel [NHANES]    Frequency of Communication with Friends and Family: Three times a week    Frequency of Social Gatherings with Friends and Family: Three times a week    Attends Religious Services: 1 to 4 times per year    Active Member of Clubs or Organizations: No    Attends Archivist Meetings: 1 to 4 times per year    Marital Status: Divorced  Intimate Partner Violence: Not At Risk (09/27/2021)   Humiliation, Afraid, Rape, and Kick questionnaire    Fear of Current or Ex-Partner: No    Emotionally Abused: No    Physically Abused: No    Sexually Abused: No    FAMILY HISTORY: Family History  Problem Relation Age of Onset   Breast cancer Mother 46   Cancer Mother    Cancer Father    Lung cancer Father    Breast cancer Cousin        dx 2s maternal    ALLERGIES:  is allergic to aspirin.  MEDICATIONS:  Current Outpatient Medications  Medication Sig Dispense Refill   acetaminophen (TYLENOL) 650 MG CR tablet Take 1,300 mg by mouth every 8 (eight) hours as needed for pain.     albuterol (VENTOLIN HFA) 108 (90 Base) MCG/ACT inhaler Inhale 2 puffs into the lungs every 6 (six) hours as needed for wheezing or shortness of breath.     Cranberry-Vitamin C-Probiotic (AZO CRANBERRY PO) Take by mouth.     docusate sodium (COLACE) 100 MG capsule  Take 1 capsule (100 mg total) by mouth 2 (two) times daily. 60 capsule 1   empagliflozin (JARDIANCE) 25 MG TABS tablet Take 25 mg by mouth daily.     ferrous sulfate 325 (65 FE) MG EC tablet Take 325 mg by mouth daily with breakfast.     FLUoxetine (PROZAC) 10 MG capsule Take 10 mg by mouth daily.     fluticasone (FLONASE) 50 MCG/ACT nasal spray Place 1 spray into both nostrils daily as needed for allergies or rhinitis.     hydrochlorothiazide (HYDRODIURIL) 12.5 MG tablet Take 12.5 mg by mouth daily.     hydrocortisone 2.5 % cream Apply topically 2 (two) times daily. 30 g 2   ibuprofen (ADVIL) 200 MG tablet Take 400 mg by mouth every 8 (eight) hours as needed for moderate pain.     lidocaine-prilocaine (  EMLA) cream Apply to affected area once 30 g 3   lisinopril (ZESTRIL) 40 MG tablet Take 40 mg by mouth at bedtime.     LORazepam (ATIVAN) 0.5 MG tablet Take 1 tablet (0.5 mg total) by mouth every 8 (eight) hours as needed for anxiety (nausea vomiting). 30 tablet 0   metFORMIN (GLUCOPHAGE-XR) 500 MG 24 hr tablet Take 1,000 mg by mouth at bedtime.     metoprolol succinate (TOPROL-XL) 25 MG 24 hr tablet Take 25 mg by mouth daily.     NOVOLIN 70/30 FLEXPEN (70-30) 100 UNIT/ML KwikPen 50-110 Units See admin instructions. 50 units in the morning, 110 units at bedtime     ondansetron (ZOFRAN) 8 MG tablet Take 1 tablet (8 mg total) by mouth 2 (two) times daily as needed. Start on the third day after chemotherapy. 30 tablet 1   OZEMPIC, 1 MG/DOSE, 4 MG/3ML SOPN Inject 1 mg into the skin every Tuesday.     prochlorperazine (COMPAZINE) 10 MG tablet Take 1 tablet (10 mg total) by mouth every 6 (six) hours as needed (Nausea or vomiting). 30 tablet 1   rosuvastatin (CRESTOR) 10 MG tablet Take 10 mg by mouth daily.     traMADol (ULTRAM) 50 MG tablet Take 2 tablets (100 mg total) by mouth every 6 (six) hours as needed. 90 tablet 0   potassium chloride SA (KLOR-CON M) 20 MEQ tablet Take 1 tablet (20 mEq total) by  mouth daily. (Patient not taking: Reported on 01/31/2022) 3 tablet 0   No current facility-administered medications for this visit.   Facility-Administered Medications Ordered in Other Visits  Medication Dose Route Frequency Provider Last Rate Last Admin   heparin lock flush 100 UNIT/ML injection            sodium chloride flush (NS) 0.9 % injection 10 mL  10 mL Intracatheter PRN Earlie Server, MD   10 mL at 03/14/22 1051     PHYSICAL EXAMINATION: ECOG PERFORMANCE STATUS: 1 - Symptomatic but completely ambulatory  Physical Exam Constitutional:      General: She is not in acute distress.    Appearance: She is obese.  HENT:     Head: Normocephalic and atraumatic.  Eyes:     General: No scleral icterus. Cardiovascular:     Rate and Rhythm: Normal rate and regular rhythm.     Heart sounds: Normal heart sounds.  Pulmonary:     Effort: Pulmonary effort is normal. No respiratory distress.     Breath sounds: No wheezing.  Abdominal:     General: Bowel sounds are normal. There is no distension.     Palpations: Abdomen is soft.  Musculoskeletal:        General: No deformity. Normal range of motion.     Cervical back: Normal range of motion and neck supple.     Right lower leg: Edema present.     Left lower leg: Edema present.     Comments:    Skin:    General: Skin is warm and dry.     Findings: No erythema or rash.     Comments: Chronic venous sufficiency skin changes  Neurological:     Mental Status: She is alert and oriented to person, place, and time. Mental status is at baseline.     Cranial Nerves: No cranial nerve deficit.     Coordination: Coordination normal.  Psychiatric:        Mood and Affect: Mood normal.      LABORATORY DATA:  I have reviewed the data as listed    Latest Ref Rng & Units 03/14/2022    8:20 AM 02/21/2022    8:12 AM 01/31/2022    8:02 AM  CBC  WBC 4.0 - 10.5 K/uL 6.6  6.0  7.8   Hemoglobin 12.0 - 15.0 g/dL 10.9  11.1  11.2   Hematocrit 36.0 -  46.0 % 33.6  35.2  35.2   Platelets 150 - 400 K/uL 261  248  250       Latest Ref Rng & Units 03/14/2022    8:20 AM 02/21/2022    8:12 AM 01/31/2022    8:02 AM  CMP  Glucose 70 - 99 mg/dL 200  170  188   BUN 8 - 23 mg/dL 34  26  26   Creatinine 0.44 - 1.00 mg/dL 1.24  0.91  1.05   Sodium 135 - 145 mmol/L 139  140  138   Potassium 3.5 - 5.1 mmol/L 4.2  4.2  4.1   Chloride 98 - 111 mmol/L 108  108  106   CO2 22 - 32 mmol/L _0 Calcium 8.9 - 10.3 mg/dL 9.9  9.8  9.8   Total Protein 6.5 - 8.1 g/dL 7.4  7.3  7.5   Total Bilirubin 0.3 - 1.2 mg/dL 0.3  0.3  0.6   Alkaline Phos 38 - 126 U/L 62  68  59   AST 15 - 41 U/L _1 ALT 0 - 44 U/L _2 RADIOGRAPHIC STUDIES: I have personally reviewed the radiological images as listed and agreed with the findings in the report. CT CHEST ABDOMEN PELVIS W CONTRAST  Result Date: 01/24/2022 CLINICAL DATA:  Follow-up of endometrial carcinoma. * Tracking Code: BO * EXAM: CT CHEST, ABDOMEN, AND PELVIS WITH CONTRAST TECHNIQUE: Multidetector CT imaging of the chest, abdomen and pelvis was performed following the standard protocol during bolus administration of intravenous contrast. RADIATION DOSE REDUCTION: This exam was performed according to the departmental dose-optimization program which includes automated exposure control, adjustment of the mA and/or kV according to patient size and/or use of iterative reconstruction technique. CONTRAST:  118m OMNIPAQUE IOHEXOL 300 MG/ML  SOLN COMPARISON:  10/03/2021 PET.  Abdominal MRI 08/05/2021. FINDINGS: CT CHEST FINDINGS Cardiovascular: Right Port-A-Cath tip, mid right atrium. Aortic atherosclerosis. Moderate cardiomegaly, without pericardial effusion. Three vessel coronary artery calcification. No central pulmonary embolism, on this non-dedicated study. Mediastinum/Nodes: No supraclavicular adenopathy. No mediastinal or hilar adenopathy. Tiny hiatal hernia. Lungs/Pleura: No pleural fluid.   Clear lungs. Musculoskeletal: No acute osseous abnormality. Presumed sebaceous cyst about the left paramidline chest wall of 1.0 cm on 02/03. CT ABDOMEN PELVIS FINDINGS Hepatobiliary: Hepatomegaly at 22.4 cm. Similar hepatic cysts and too small to characterize lesions. Normal gallbladder, without biliary ductal dilatation. Pancreas: Normal, without mass or ductal dilatation. Spleen: Normal in size, without focal abnormality. Adrenals/Urinary Tract: Left adrenal nodule of 2.7 by 2.3 cm is similar including back to 02/12/2021. Normal right adrenal gland and kidney. Left renal interpolar lesion favored to represent a minimally complex cyst, including at up to 25 HU, when compared to the prior noncontrast CT from PET . In the absence of clinically indicated signs/symptoms require(s) no independent follow-up. Normal urinary bladder. Stomach/Bowel: Normal remainder of the stomach. Transverse duodenal diverticulum. Otherwise normal small bowel. Extensive colonic diverticulosis. Normal terminal ileum. Appendix not visualized. Vascular/Lymphatic: Aortic atherosclerosis. No abdominopelvic adenopathy. Reproductive:  No residual uterine mass, given mild limitations of patient body habitus. Subtle foci of gas within the lower uterine segment and cervix including on 107 and 110 of series 3 are nonspecific, present on prior PET. No adnexal mass. Other: No significant free fluid. Mild pelvic floor laxity. No evidence of omental or peritoneal disease. Musculoskeletal: Lumbosacral spondylosis with grade 1 L4-5 anterolisthesis. IMPRESSION: 1. No findings of progressive metastatic disease within the chest, abdomen, or pelvis. 2. Left adrenal nodule is similar in size to 10/03/2021 PET. This was felt to be suspicious for treated metastasis on prior PET. 3. Hepatomegaly 4. Coronary artery atherosclerosis. Aortic Atherosclerosis (ICD10-I70.0). 5.  Tiny hiatal hernia. Electronically Signed   By: Abigail Miyamoto M.D.   On: 01/24/2022 14:00

## 2022-03-14 NOTE — Assessment & Plan Note (Addendum)
s/p carboplatin and Taxol for 5 cycles, on Keytruda maintenance.  CT showed NED Labs are reviewed and discussed with patient. Proceed with maintenance.  Keytruda

## 2022-03-14 NOTE — Assessment & Plan Note (Signed)
Treatment plan as listed above. 

## 2022-03-14 NOTE — Assessment & Plan Note (Signed)
lipid poor hyperfunctioning adenoma vs mets.  FDG avid on PET scan.  Difficult biopsy due to patient's body habitus and deep position of the mass. Resolved on PET scan, suggesting that this lesion might be a metastatic lesion. Continue monitor  

## 2022-03-14 NOTE — Patient Instructions (Signed)
[facility]  Discharge Instructions: Thank you for choosing [facility] to provide your oncology and hematology care.  If you have a lab appointment with the Cancer Center, please go directly to the Cancer Center and check in at the registration area.  Wear comfortable clothing and clothing appropriate for easy access to any Portacath or PICC line.   We strive to give you quality time with your provider. You may need to reschedule your appointment if you arrive late (15 or more minutes).  Arriving late affects you and other patients whose appointments are after yours.  Also, if you miss three or more appointments without notifying the office, you may be dismissed from the clinic at the provider's discretion.      For prescription refill requests, have your pharmacy contact our office and allow 72 hours for refills to be completed.    Today you received the following chemotherapy and/or immunotherapy agents Keytruda      To help prevent nausea and vomiting after your treatment, we encourage you to take your nausea medication as directed.  BELOW ARE SYMPTOMS THAT SHOULD BE REPORTED IMMEDIATELY: *FEVER GREATER THAN 100.4 F (38 C) OR HIGHER *CHILLS OR SWEATING *NAUSEA AND VOMITING THAT IS NOT CONTROLLED WITH YOUR NAUSEA MEDICATION *UNUSUAL SHORTNESS OF BREATH *UNUSUAL BRUISING OR BLEEDING *URINARY PROBLEMS (pain or burning when urinating, or frequent urination) *BOWEL PROBLEMS (unusual diarrhea, constipation, pain near the anus) TENDERNESS IN MOUTH AND THROAT WITH OR WITHOUT PRESENCE OF ULCERS (sore throat, sores in mouth, or a toothache) UNUSUAL RASH, SWELLING OR PAIN  UNUSUAL VAGINAL DISCHARGE OR ITCHING   Items with * indicate a potential emergency and should be followed up as soon as possible or go to the Emergency Department if any problems should occur.  Please show the CHEMOTHERAPY ALERT CARD or IMMUNOTHERAPY ALERT CARD at check-in to  the Emergency Department and triage nurse.  Should you have questions after your visit or need to cancel or reschedule your appointment, please contact [facility]  [phone] and follow the prompts.  Office hours are 8:00 a.m. to 4:30 p.m. Monday - Friday. Please note that voicemails left after 4:00 p.m. may not be returned until the following business day.  We are closed weekends and major holidays. You have access to a nurse at all times for urgent questions. Please call the main number to the clinic [phone] and follow the prompts.  For any non-urgent questions, you may also contact your provider using MyChart. We now offer e-Visits for anyone 18 and older to request care online for non-urgent symptoms. For details visit [email or web address].   Also download the MyChart app! Go to the app store, search "MyChart", open the app, select [facility], and log in with your MyChart username and password.  Masks are optional in the cancer centers. If you would like for your care team to wear a mask while they are taking care of you, please let them know. For doctor visits, patients may have with them one support person who is at least 70 years old. At this time, visitors are not allowed in the infusion area.   

## 2022-03-14 NOTE — Progress Notes (Signed)
Pt here for follow up. Pt reports that she has problems sleeping

## 2022-04-04 ENCOUNTER — Inpatient Hospital Stay: Payer: Medicare PPO | Admitting: Oncology

## 2022-04-04 ENCOUNTER — Inpatient Hospital Stay: Payer: Medicare PPO

## 2022-04-04 ENCOUNTER — Encounter: Payer: Self-pay | Admitting: Oncology

## 2022-04-04 ENCOUNTER — Inpatient Hospital Stay: Payer: Medicare PPO | Attending: Obstetrics and Gynecology

## 2022-04-04 VITALS — BP 125/66 | HR 100 | Temp 98.0°F | Resp 18 | Wt 279.9 lb

## 2022-04-04 DIAGNOSIS — Z79899 Other long term (current) drug therapy: Secondary | ICD-10-CM | POA: Diagnosis not present

## 2022-04-04 DIAGNOSIS — Z801 Family history of malignant neoplasm of trachea, bronchus and lung: Secondary | ICD-10-CM | POA: Diagnosis not present

## 2022-04-04 DIAGNOSIS — I2510 Atherosclerotic heart disease of native coronary artery without angina pectoris: Secondary | ICD-10-CM | POA: Insufficient documentation

## 2022-04-04 DIAGNOSIS — R5383 Other fatigue: Secondary | ICD-10-CM | POA: Diagnosis not present

## 2022-04-04 DIAGNOSIS — C541 Malignant neoplasm of endometrium: Secondary | ICD-10-CM

## 2022-04-04 DIAGNOSIS — I10 Essential (primary) hypertension: Secondary | ICD-10-CM | POA: Insufficient documentation

## 2022-04-04 DIAGNOSIS — K760 Fatty (change of) liver, not elsewhere classified: Secondary | ICD-10-CM | POA: Diagnosis not present

## 2022-04-04 DIAGNOSIS — M2550 Pain in unspecified joint: Secondary | ICD-10-CM | POA: Diagnosis not present

## 2022-04-04 DIAGNOSIS — Z809 Family history of malignant neoplasm, unspecified: Secondary | ICD-10-CM | POA: Insufficient documentation

## 2022-04-04 DIAGNOSIS — R160 Hepatomegaly, not elsewhere classified: Secondary | ICD-10-CM | POA: Insufficient documentation

## 2022-04-04 DIAGNOSIS — K5730 Diverticulosis of large intestine without perforation or abscess without bleeding: Secondary | ICD-10-CM | POA: Insufficient documentation

## 2022-04-04 DIAGNOSIS — T451X5A Adverse effect of antineoplastic and immunosuppressive drugs, initial encounter: Secondary | ICD-10-CM | POA: Diagnosis not present

## 2022-04-04 DIAGNOSIS — Z86018 Personal history of other benign neoplasm: Secondary | ICD-10-CM | POA: Insufficient documentation

## 2022-04-04 DIAGNOSIS — D6481 Anemia due to antineoplastic chemotherapy: Secondary | ICD-10-CM

## 2022-04-04 DIAGNOSIS — E119 Type 2 diabetes mellitus without complications: Secondary | ICD-10-CM | POA: Diagnosis not present

## 2022-04-04 DIAGNOSIS — I700 Atherosclerosis of aorta: Secondary | ICD-10-CM | POA: Insufficient documentation

## 2022-04-04 DIAGNOSIS — Z803 Family history of malignant neoplasm of breast: Secondary | ICD-10-CM | POA: Insufficient documentation

## 2022-04-04 DIAGNOSIS — Z923 Personal history of irradiation: Secondary | ICD-10-CM | POA: Insufficient documentation

## 2022-04-04 DIAGNOSIS — Z886 Allergy status to analgesic agent status: Secondary | ICD-10-CM | POA: Diagnosis not present

## 2022-04-04 DIAGNOSIS — Z5112 Encounter for antineoplastic immunotherapy: Secondary | ICD-10-CM

## 2022-04-04 LAB — CBC WITH DIFFERENTIAL/PLATELET
Abs Immature Granulocytes: 0.05 10*3/uL (ref 0.00–0.07)
Basophils Absolute: 0 10*3/uL (ref 0.0–0.1)
Basophils Relative: 1 %
Eosinophils Absolute: 0.4 10*3/uL (ref 0.0–0.5)
Eosinophils Relative: 6 %
HCT: 34.1 % — ABNORMAL LOW (ref 36.0–46.0)
Hemoglobin: 11 g/dL — ABNORMAL LOW (ref 12.0–15.0)
Immature Granulocytes: 1 %
Lymphocytes Relative: 16 %
Lymphs Abs: 1 10*3/uL (ref 0.7–4.0)
MCH: 30.9 pg (ref 26.0–34.0)
MCHC: 32.3 g/dL (ref 30.0–36.0)
MCV: 95.8 fL (ref 80.0–100.0)
Monocytes Absolute: 0.6 10*3/uL (ref 0.1–1.0)
Monocytes Relative: 10 %
Neutro Abs: 4.3 10*3/uL (ref 1.7–7.7)
Neutrophils Relative %: 66 %
Platelets: 262 10*3/uL (ref 150–400)
RBC: 3.56 MIL/uL — ABNORMAL LOW (ref 3.87–5.11)
RDW: 13.8 % (ref 11.5–15.5)
WBC: 6.4 10*3/uL (ref 4.0–10.5)
nRBC: 0 % (ref 0.0–0.2)

## 2022-04-04 LAB — COMPREHENSIVE METABOLIC PANEL
ALT: 14 U/L (ref 0–44)
AST: 24 U/L (ref 15–41)
Albumin: 3.8 g/dL (ref 3.5–5.0)
Alkaline Phosphatase: 63 U/L (ref 38–126)
Anion gap: 10 (ref 5–15)
BUN: 33 mg/dL — ABNORMAL HIGH (ref 8–23)
CO2: 20 mmol/L — ABNORMAL LOW (ref 22–32)
Calcium: 9.4 mg/dL (ref 8.9–10.3)
Chloride: 106 mmol/L (ref 98–111)
Creatinine, Ser: 0.99 mg/dL (ref 0.44–1.00)
GFR, Estimated: >60 mL/min (ref >60)
Glucose, Bld: 189 mg/dL — ABNORMAL HIGH (ref 70–99)
Potassium: 4 mmol/L (ref 3.5–5.1)
Sodium: 136 mmol/L (ref 135–145)
Total Bilirubin: 0.3 mg/dL (ref 0.3–1.2)
Total Protein: 7.2 g/dL (ref 6.5–8.1)

## 2022-04-04 MED ORDER — SODIUM CHLORIDE 0.9 % IV SOLN
Freq: Once | INTRAVENOUS | Status: AC
  Filled 2022-04-04: qty 250

## 2022-04-04 MED ORDER — SODIUM CHLORIDE 0.9 % IV SOLN
200.0000 mg | Freq: Once | INTRAVENOUS | Status: AC
  Administered 2022-04-04: 200 mg via INTRAVENOUS
  Filled 2022-04-04: qty 8

## 2022-04-04 MED ORDER — SODIUM CHLORIDE 0.9% FLUSH
10.0000 mL | INTRAVENOUS | Status: DC | PRN
  Administered 2022-04-04: 10 mL
  Filled 2022-04-04: qty 10

## 2022-04-04 MED ORDER — HEPARIN SOD (PORK) LOCK FLUSH 100 UNIT/ML IV SOLN
500.0000 [IU] | Freq: Once | INTRAVENOUS | Status: AC | PRN
  Administered 2022-04-04: 500 [IU]
  Filled 2022-04-04: qty 5

## 2022-04-04 NOTE — Assessment & Plan Note (Signed)
She is currently on immunotherapy.  Hemoglobin improved.  

## 2022-04-04 NOTE — Progress Notes (Signed)
Hematology/Oncology Progress note [phone] [phone]         Patient Care Team: Tracie Harrier, MD as PCP - General (Internal Medicine)   CHIEF COMPLAINTS/REASON FOR VISIT:  Follow up for endometrial cancer  ASSESSMENT & PLAN:   Cancer Staging  Endometrial adenocarcinoma [facility]) Staging form: Corpus Uteri - Carcinoma and Carcinosarcoma, AJCC 8th Edition - Clinical stage from 04/10/2021: FIGO Stage III, calculated as Stage Unknown (cT3, cNX, cM0) - Signed by Earlie Server, MD on 08/24/2021   Endometrial adenocarcinoma ([place]) s/p carboplatin and Taxol for 5 cycles, on Keytruda maintenance.  CT showed NED Labs are reviewed and discussed with patient. Proceed with maintenance.  Keytruda  Repeat CT in March 2024  Encounter for antineoplastic immunotherapy Treatment plan as listed above.   Anemia due to antineoplastic chemotherapy She is currently on immunotherapy.  Hemoglobin improved.    Orders Placed This Encounter  Procedures   CT CHEST ABDOMEN PELVIS W CONTRAST    Standing Status:   Future    Standing Expiration Date:   04/05/2023    Order Specific Question:   If indicated for the ordered procedure, I authorize the administration of contrast media per Radiology protocol    Answer:   Yes    Order Specific Question:   Does the patient have a contrast media/X-ray dye allergy?    Answer:   No    Order Specific Question:   Preferred imaging location?    Answer:   [facility]    Order Specific Question:   Is Oral Contrast requested for this exam?    Answer:   Yes, Per Radiology protocol   CBC with Differential    Standing Status:   Future    Standing Expiration Date:   04/26/2023   Comprehensive metabolic panel    Standing Status:   Future    Standing Expiration Date:   04/26/2023   CBC with Differential    Standing Status:   Future    Standing Expiration Date:   05/17/2023   Comprehensive metabolic panel    Standing Status:   Future    Standing  Expiration Date:   05/17/2023   TSH    Standing Status:   Future    Standing Expiration Date:   05/17/2023     Return of visit:  3 weeks Lab MD Octaviano Batty, MD, [name] [facility] Hematology Oncology 04/04/2022     HISTORY OF PRESENTING ILLNESS:   [facility] is a  72 y.o.  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 Oncology History  Endometrial adenocarcinoma ([place])  12/12/2020 Imaging   12/12/2020, CT abdomen pelvis with contrast showed no radiographic evidence of urinary tract neoplasm, calculi, hydronephrosis.  2.6 cm nonspecific left adrenal mass. 02/12/2021 CT hematuria work-up showed small uterine fibroids, no findings to explain hematuria.Hepatomegaly, diverticulosis without evidence of diverticulitis, Coronary artery disease   04/01/2021 -  Hospital Admission   04/01/2021 - 04/03/2021, patient was hospitalized due to symptomatic anemia, hemoglobin 6.9, heavy postmenopausal bleeding with passing large clots.  She also presented with increased creatinine level to 2.58 compared to her baseline level of 0.9 in November 2022.  Patient received IV iron infusion and 2 units of PRBC during the hospital stay..  04/02/2021, iron panel showed iron saturation 37, ferritin 37, TIBC 333-the studies were done after patient received blood transfusion.  At discharge, hemoglobin was 7.7.   04/01/2021 Initial Diagnosis   04/01/2020, endometrial  biopsy showed poorly differentiated endometrial adenocarcinoma. Omniseq NGS showed TMB 48.4 mt/mb [high], MSI High, PD-L1-TPS <1%, PIK3CA F7902I, Negative for BRAF, HER2, NTRK1 RET.     04/10/2021 Cancer Staging   Staging form: Corpus Uteri - Carcinoma and Carcinosarcoma, AJCC 8th Edition - Clinical stage from 04/10/2021: FIGO Stage III, calculated as Stage Unknown (cT3, cNX, cM0) - Signed by Earlie Server, MD on 08/24/2021 Stage prefix: Initial diagnosis   04/15/2021 Imaging   PET showed 1. Large  hypermetabolic endometrial mass consistent with known endometrial cancer. Suspect direct invasion/involvement of the right adnexa. Right pelvic sidewall hypermetabolic adenopathy.2. 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 3. No findings for metastatic disease involving the chest or bonystructures.     04/24/2021 - 05/29/2021 Radiation Therapy   status post radiation to pelvis   08/05/2021 Imaging   MRI abdomen w wo contrast 1. Stable solid enhancing 2.7 cm left adrenal lesion, which was hypermetabolic on prior PET/CTs but is unchanged in size dating back to December 12, 2020. 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 However, unfortunately metastatic disease can not be entirely excluded and remains a pertinent differential consideration. Comparison with more remote prior imaging would be the most valuable tool in the assessment of this lesion, as demonstrating long-term stability would indicate this to be a benign lesion. However, if no prior imaging can be made available, would consider follow-up adrenal protocol CT with and without intravenous contrast material in 3 months as this would allow for further assessment of stability as well as enhancement and washout characteristics of the lesion potentially allowing for better characterization versus direct tissue sampling.2. Hepatomegaly and hepatic steatosis.3. Colonic diverticulosis without findings of acute diverticulitis    08/16/2021 - 09/27/2021 Chemotherapy   UTERINE Carboplatin AUC 5 / Paclitaxel q21d x 3      10/03/2021 Imaging   PET 1. No residual hypermetabolism in the uterus suggesting an excellent response to treatment. No findings for metastatic disease. 2. Resolution of  hypermetabolism in the left adrenal gland lesion suggesting this was metastatic disease. 3. Diffuse marrow hypermetabolism likely due to rebound from chemotherapy or marrow stimulating drugs   10/18/2021 - 11/08/2021 Chemotherapy   AUC 5 / Paclitaxel/ Keytruda Q21d  x 2 cycles   11/29/2021 -  Chemotherapy   Patient is on Treatment Plan : UTERINE Pembrolizumab (200) q21d         INTERVAL HISTORY [facility] is a 72 y.o. female who has above history reviewed by me today presents for follow up visit for anemia and endometrial cancer Overall she tolerates treatment, No new complaints.  Patient denies nausea vomiting diarrhea. She saw PCP yesterday and was prescribed Doxycycline for cellulitis on right lower extremity.   Review of Systems  Constitutional:  Positive for fatigue. Negative for chills and fever.  HENT:   Negative for hearing loss and voice change.   Eyes:  Negative for eye problems.  Respiratory:  Negative for chest tightness and cough.   Cardiovascular:  Negative for chest pain.  Gastrointestinal:  Negative for abdominal distention, abdominal pain and blood in stool.  Endocrine: Negative for hot flashes.  Genitourinary:  Negative for difficulty urinating, frequency and vaginal bleeding.   Musculoskeletal:  Positive for arthralgias.  Skin:  Negative for itching and rash.  Neurological:  Negative for extremity weakness.  Hematological:  Negative for adenopathy.  Psychiatric/Behavioral:  Negative for confusion.     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    SURGICAL HISTORY: Past Surgical History:  Procedure Laterality Date   CESAREAN SECTION  01/23/1979   IR IMAGING GUIDED PORT INSERTION   08/09/2021   TONSILLECTOMY  03/17/1956    SOCIAL HISTORY: Social History   Socioeconomic History   Marital status: Divorced    Spouse name: Not on file   Number of children: Not on file   Years of education: Not on file   Highest education level: Not on file  Occupational History   Not on file  Tobacco Use   Smoking status: Never   Smokeless tobacco: Never  Vaping Use   Vaping Use: Never used  Substance and Sexual Activity   Alcohol use: Not Currently   Drug use: Never   Sexual activity: Not Currently  Other Topics Concern   Not on file  Social History Narrative      Social Determinants of Health   Financial Resource Strain: Low Risk  (09/27/2021)   Overall Financial Resource Strain (CARDIA)    Difficulty of Paying Living Expenses: Not very hard  Food Insecurity: No Food Insecurity (09/27/2021)   Hunger Vital Sign    Worried About Running Out of Food in the Last Year: Never true    [place] in the Last Year: Never true  Transportation Needs: No Transportation Needs (09/27/2021)   PRAPARE - Hydrologist (Medical): No    Lack of Transportation (Non-Medical): No  Physical Activity: Inactive (09/27/2021)   Exercise Vital Sign    Days of Exercise per Week: 0 days    Minutes of Exercise per Session: 0 min  Stress: Stress Concern Present (09/27/2021)   [place]    Feeling of Stress : Rather much  Social Connections: Moderately Integrated (09/27/2021)   Social Connection and Isolation Panel [NHANES]    Frequency of Communication with Friends and Family: Three times a week    Frequency of Social Gatherings with Friends and Family: Three times a week    Attends Religious Services: 1 to 4 times per year    Active Member of Clubs or Organizations: No    Attends Archivist Meetings: 1 to 4 times per year    Marital Status: Divorced  Intimate Partner Violence: Not At  Risk (09/27/2021)   Humiliation, Afraid, Rape, and Kick questionnaire    Fear of Current or Ex-Partner: No    Emotionally Abused: No    Physically Abused: No    Sexually Abused: No    FAMILY HISTORY: Family History  Problem Relation Age of Onset   Breast cancer Mother 20   Cancer Mother    Cancer Father    Lung cancer Father    Breast cancer Cousin        dx 13s maternal    ALLERGIES:  is allergic to aspirin.  MEDICATIONS:  Current Outpatient Medications  Medication Sig Dispense Refill   acetaminophen (TYLENOL) 650 MG CR tablet Take 1,300 mg by mouth every 8 (eight)  hours as needed for pain.     albuterol (VENTOLIN HFA) 108 (90 Base) MCG/ACT inhaler Inhale 2 puffs into the lungs every 6 (six) hours as needed for wheezing or shortness of breath.     Cranberry-Vitamin C-Probiotic (AZO CRANBERRY PO) Take by mouth.     docusate sodium (COLACE) 100 MG capsule Take 1 capsule (100 mg total) by mouth 2 (two) times daily. 60 capsule 1   doxycycline (VIBRAMYCIN) 100 MG capsule Take 100 mg by mouth 2 (two) times daily. Take for 2 weeks starting on 1/18     empagliflozin (JARDIANCE) 25 MG TABS tablet Take 25 mg by mouth daily.     ferrous sulfate 325 (65 FE) MG EC tablet Take 325 mg by mouth daily with breakfast.     FLUoxetine (PROZAC) 10 MG capsule Take 10 mg by mouth daily.     fluticasone (FLONASE) 50 MCG/ACT nasal spray Place 1 spray into both nostrils daily as needed for allergies or rhinitis.     hydrochlorothiazide (HYDRODIURIL) 12.5 MG tablet Take 12.5 mg by mouth daily.     hydrocortisone 2.5 % cream Apply topically 2 (two) times daily. 30 g 2   ibuprofen (ADVIL) 200 MG tablet Take 400 mg by mouth every 8 (eight) hours as needed for moderate pain.     lidocaine-prilocaine (EMLA) cream Apply to affected area once 30 g 3   lisinopril (ZESTRIL) 40 MG tablet Take 40 mg by mouth at bedtime.     LORazepam (ATIVAN) 0.5 MG tablet Take 1 tablet (0.5 mg total) by mouth every 8 (eight) hours  as needed for anxiety (nausea vomiting). 30 tablet 0   metFORMIN (GLUCOPHAGE-XR) 500 MG 24 hr tablet Take 1,000 mg by mouth at bedtime.     metoprolol succinate (TOPROL-XL) 25 MG 24 hr tablet Take 25 mg by mouth daily.     NOVOLIN 70/30 FLEXPEN (70-30) 100 UNIT/ML KwikPen 50-110 Units See admin instructions. 50 units in the morning, 110 units at bedtime     ondansetron (ZOFRAN) 8 MG tablet Take 1 tablet (8 mg total) by mouth 2 (two) times daily as needed. Start on the third day after chemotherapy. 30 tablet 1   OZEMPIC, 1 MG/DOSE, 4 MG/3ML SOPN Inject 1 mg into the skin every Tuesday.     prochlorperazine (COMPAZINE) 10 MG tablet Take 1 tablet (10 mg total) by mouth every 6 (six) hours as needed (Nausea or vomiting). 30 tablet 1   rosuvastatin (CRESTOR) 10 MG tablet Take 10 mg by mouth daily.     traMADol (ULTRAM) 50 MG tablet Take 2 tablets (100 mg total) by mouth every 6 (six) hours as needed. 90 tablet 0   potassium chloride SA (KLOR-CON M) 20 MEQ tablet Take 1 tablet (20 mEq total) by mouth daily. (Patient not taking: Reported on 01/31/2022) 3 tablet 0   No current facility-administered medications for this visit.   Facility-Administered Medications Ordered in Other Visits  Medication Dose Route Frequency Provider Last Rate Last Admin   heparin lock flush 100 UNIT/ML injection            sodium chloride flush (NS) 0.9 % injection 10 mL  10 mL Intracatheter PRN Earlie Server, MD   10 mL at 04/04/22 1050     PHYSICAL EXAMINATION: ECOG PERFORMANCE STATUS: 1 - Symptomatic but completely ambulatory  Physical Exam Constitutional:      General: She is not in acute distress.    Appearance: She is obese.  HENT:  Head: Normocephalic and atraumatic.  Eyes:     General: No scleral icterus. Cardiovascular:     Rate and Rhythm: Normal rate and regular rhythm.     Heart sounds: Normal heart sounds.  Pulmonary:     Effort: Pulmonary effort is normal. No respiratory distress.     Breath sounds:  No wheezing.  Abdominal:     General: Bowel sounds are normal. There is no distension.     Palpations: Abdomen is soft.  Musculoskeletal:        General: No deformity. Normal range of motion.     Cervical back: Normal range of motion and neck supple.     Right lower leg: Edema present.     Left lower leg: Edema present.     Comments:    Skin:    General: Skin is warm and dry.     Findings: No erythema or rash.     Comments: Chronic venous sufficiency skin changes  Neurological:     Mental Status: She is alert and oriented to person, place, and time. Mental status is at baseline.     Cranial Nerves: No cranial nerve deficit.     Coordination: Coordination normal.  Psychiatric:        Mood and Affect: Mood normal.      LABORATORY DATA:  I have reviewed the data as listed    Latest Ref Rng & Units 04/04/2022    8:27 AM 03/14/2022    8:20 AM 02/21/2022    8:12 AM  CBC  WBC 4.0 - 10.5 K/uL 6.4  6.6  6.0   Hemoglobin 12.0 - 15.0 g/dL 11.0  10.9  11.1   Hematocrit 36.0 - 46.0 % 34.1  33.6  35.2   Platelets 150 - 400 K/uL 262  261  248       Latest Ref Rng & Units 04/04/2022    8:27 AM 03/14/2022    8:20 AM 02/21/2022    8:12 AM  CMP  Glucose 70 - 99 mg/dL 189  200  170   BUN 8 - 23 mg/dL 33  34  26   Creatinine 0.44 - 1.00 mg/dL 0.99  1.24  0.91   Sodium 135 - 145 mmol/L 136  139  140   Potassium 3.5 - 5.1 mmol/L 4.0  4.2  4.2   Chloride 98 - 111 mmol/L 106  108  108   CO2 22 - 32 mmol/L '20  21  22   '$ Calcium 8.9 - 10.3 mg/dL 9.4  9.9  9.8   Total Protein 6.5 - 8.1 g/dL 7.2  7.4  7.3   Total Bilirubin 0.3 - 1.2 mg/dL 0.3  0.3  0.3   Alkaline Phos 38 - 126 U/L 63  62  68   AST 15 - 41 U/L '24  24  21   '$ ALT 0 - 44 U/L '14  16  14       '$ RADIOGRAPHIC STUDIES: I have personally reviewed the radiological images as listed and agreed with the findings in the report. CT CHEST ABDOMEN PELVIS W CONTRAST  Result Date: 01/24/2022 CLINICAL DATA:  Follow-up of endometrial  carcinoma. * Tracking Code: BO * EXAM: CT CHEST, ABDOMEN, AND PELVIS WITH CONTRAST TECHNIQUE: Multidetector CT imaging of the chest, abdomen and pelvis was performed following the standard protocol during bolus administration of intravenous contrast. RADIATION DOSE REDUCTION: This exam was performed according to the departmental dose-optimization program which includes automated exposure control, adjustment of the mA and/or  kV according to patient size and/or use of iterative reconstruction technique. CONTRAST:  170m OMNIPAQUE IOHEXOL 300 MG/ML  SOLN COMPARISON:  10/03/2021 PET.  Abdominal MRI 08/05/2021. FINDINGS: CT CHEST FINDINGS Cardiovascular: Right Port-A-Cath tip, mid right atrium. Aortic atherosclerosis. Moderate cardiomegaly, without pericardial effusion. Three vessel coronary artery calcification. No central pulmonary embolism, on this non-dedicated study. Mediastinum/Nodes: No supraclavicular adenopathy. No mediastinal or hilar adenopathy. Tiny hiatal hernia. Lungs/Pleura: No pleural fluid.  Clear lungs. Musculoskeletal: No acute osseous abnormality. Presumed sebaceous cyst about the left paramidline chest wall of 1.0 cm on 02/03. CT ABDOMEN PELVIS FINDINGS Hepatobiliary: Hepatomegaly at 22.4 cm. Similar hepatic cysts and too small to characterize lesions. Normal gallbladder, without biliary ductal dilatation. Pancreas: Normal, without mass or ductal dilatation. Spleen: Normal in size, without focal abnormality. Adrenals/Urinary Tract: Left adrenal nodule of 2.7 by 2.3 cm is similar including back to 02/12/2021. Normal right adrenal gland and kidney. Left renal interpolar lesion favored to represent a minimally complex cyst, including at up to 25 HU, when compared to the prior noncontrast CT from PET . In the absence of clinically indicated signs/symptoms require(s) no independent follow-up. Normal urinary bladder. Stomach/Bowel: Normal remainder of the stomach. Transverse duodenal diverticulum.  Otherwise normal small bowel. Extensive colonic diverticulosis. Normal terminal ileum. Appendix not visualized. Vascular/Lymphatic: Aortic atherosclerosis. No abdominopelvic adenopathy. Reproductive: No residual uterine mass, given mild limitations of patient body habitus. Subtle foci of gas within the lower uterine segment and cervix including on 107 and 110 of series 3 are nonspecific, present on prior PET. No adnexal mass. Other: No significant free fluid. Mild pelvic floor laxity. No evidence of omental or peritoneal disease. Musculoskeletal: Lumbosacral spondylosis with grade 1 L4-5 anterolisthesis. IMPRESSION: 1. No findings of progressive metastatic disease within the chest, abdomen, or pelvis. 2. Left adrenal nodule is similar in size to 10/03/2021 PET. This was felt to be suspicious for treated metastasis on prior PET. 3. Hepatomegaly 4. Coronary artery atherosclerosis. Aortic Atherosclerosis (ICD10-I70.0). 5.  Tiny hiatal hernia. Electronically Signed   By: KAbigail MiyamotoM.D.   On: 01/24/2022 14:00

## 2022-04-04 NOTE — Patient Instructions (Signed)
[facility] CTR AT [place]  Discharge Instructions: Thank you for choosing [facility] to provide your oncology and hematology care.  If you have a lab appointment with the [facility], please go directly to the [facility] and check in at the registration area.  Wear comfortable clothing and clothing appropriate for easy access to any Portacath or PICC line.   We strive to give you quality time with your provider. You may need to reschedule your appointment if you arrive late (15 or more minutes).  Arriving late affects you and other patients whose appointments are after yours.  Also, if you miss three or more appointments without notifying the office, you may be dismissed from the clinic at the provider's discretion.      For prescription refill requests, have your pharmacy contact our office and allow 72 hours for refills to be completed.    Today you received the following chemotherapy and/or immunotherapy agents- [name]      To help prevent nausea and vomiting after your treatment, we encourage you to take your nausea medication as directed.  BELOW ARE SYMPTOMS THAT SHOULD BE REPORTED IMMEDIATELY: *FEVER GREATER THAN 100.4 F (38 C) OR HIGHER *CHILLS OR SWEATING *NAUSEA AND VOMITING THAT IS NOT CONTROLLED WITH YOUR NAUSEA MEDICATION *UNUSUAL SHORTNESS OF BREATH *UNUSUAL BRUISING OR BLEEDING *URINARY PROBLEMS (pain or burning when urinating, or frequent urination) *BOWEL PROBLEMS (unusual diarrhea, constipation, pain near the anus) TENDERNESS IN MOUTH AND THROAT WITH OR WITHOUT PRESENCE OF ULCERS (sore throat, sores in mouth, or a toothache) UNUSUAL RASH, SWELLING OR PAIN  UNUSUAL VAGINAL DISCHARGE OR ITCHING   Items with * indicate a potential emergency and should be followed up as soon as possible or go to the Emergency Department if any problems should occur.  Please show the CHEMOTHERAPY ALERT CARD or IMMUNOTHERAPY ALERT CARD at check-in to  the Emergency Department and triage nurse.  Should you have questions after your visit or need to cancel or reschedule your appointment, please contact [facility]CER [place] AT [place]  [phone] and follow the prompts.  Office hours are 8:00 a.m. to 4:30 p.m. Monday - Friday. Please note that voicemails left after 4:00 p.m. may not be returned until the following business day.  We are closed weekends and major holidays. You have access to a nurse at all times for urgent questions. Please call the main number to the clinic [phone] and follow the prompts.  For any non-urgent questions, you may also contact your provider using MyChart. We now offer e-Visits for anyone 39 and older to request care online for non-urgent symptoms. For details visit [email or web address].   Also download the MyChart app! Go to the app store, search "MyChart", open the app, select [place], and log in with your MyChart username and password.

## 2022-04-04 NOTE — Progress Notes (Signed)
Patient here for follow up and treatment. Pt reports that she saw Dr. Ginette Pitman yesterday and was prescribed Doxycycline for cellulitis on right foot.

## 2022-04-04 NOTE — Assessment & Plan Note (Signed)
Treatment plan as listed above. 

## 2022-04-04 NOTE — Assessment & Plan Note (Addendum)
s/p carboplatin and Taxol for 5 cycles, on Keytruda maintenance.  CT showed NED Labs are reviewed and discussed with patient. Proceed with maintenance.  Keytruda  Repeat CT in March 2024

## 2022-04-25 ENCOUNTER — Encounter: Payer: Self-pay | Admitting: Oncology

## 2022-04-25 ENCOUNTER — Inpatient Hospital Stay: Payer: Medicare PPO | Admitting: Oncology

## 2022-04-25 ENCOUNTER — Inpatient Hospital Stay: Payer: Medicare PPO | Attending: Obstetrics and Gynecology

## 2022-04-25 ENCOUNTER — Other Ambulatory Visit: Payer: Medicare PPO

## 2022-04-25 ENCOUNTER — Inpatient Hospital Stay: Payer: Medicare PPO

## 2022-04-25 ENCOUNTER — Ambulatory Visit: Payer: Medicare PPO | Admitting: Oncology

## 2022-04-25 ENCOUNTER — Ambulatory Visit: Payer: Medicare PPO

## 2022-04-25 VITALS — BP 96/60 | HR 98 | Temp 98.5°F | Resp 18 | Wt 276.9 lb

## 2022-04-25 DIAGNOSIS — Z803 Family history of malignant neoplasm of breast: Secondary | ICD-10-CM | POA: Insufficient documentation

## 2022-04-25 DIAGNOSIS — E278 Other specified disorders of adrenal gland: Secondary | ICD-10-CM | POA: Diagnosis not present

## 2022-04-25 DIAGNOSIS — R160 Hepatomegaly, not elsewhere classified: Secondary | ICD-10-CM | POA: Insufficient documentation

## 2022-04-25 DIAGNOSIS — E669 Obesity, unspecified: Secondary | ICD-10-CM | POA: Diagnosis not present

## 2022-04-25 DIAGNOSIS — R609 Edema, unspecified: Secondary | ICD-10-CM | POA: Diagnosis not present

## 2022-04-25 DIAGNOSIS — Z923 Personal history of irradiation: Secondary | ICD-10-CM | POA: Insufficient documentation

## 2022-04-25 DIAGNOSIS — Z801 Family history of malignant neoplasm of trachea, bronchus and lung: Secondary | ICD-10-CM | POA: Diagnosis not present

## 2022-04-25 DIAGNOSIS — Z86018 Personal history of other benign neoplasm: Secondary | ICD-10-CM | POA: Insufficient documentation

## 2022-04-25 DIAGNOSIS — F32A Depression, unspecified: Secondary | ICD-10-CM | POA: Diagnosis not present

## 2022-04-25 DIAGNOSIS — I10 Essential (primary) hypertension: Secondary | ICD-10-CM | POA: Diagnosis not present

## 2022-04-25 DIAGNOSIS — D6481 Anemia due to antineoplastic chemotherapy: Secondary | ICD-10-CM | POA: Insufficient documentation

## 2022-04-25 DIAGNOSIS — Z79899 Other long term (current) drug therapy: Secondary | ICD-10-CM | POA: Diagnosis not present

## 2022-04-25 DIAGNOSIS — T451X5A Adverse effect of antineoplastic and immunosuppressive drugs, initial encounter: Secondary | ICD-10-CM | POA: Insufficient documentation

## 2022-04-25 DIAGNOSIS — K760 Fatty (change of) liver, not elsewhere classified: Secondary | ICD-10-CM | POA: Diagnosis not present

## 2022-04-25 DIAGNOSIS — I872 Venous insufficiency (chronic) (peripheral): Secondary | ICD-10-CM | POA: Insufficient documentation

## 2022-04-25 DIAGNOSIS — E279 Disorder of adrenal gland, unspecified: Secondary | ICD-10-CM | POA: Insufficient documentation

## 2022-04-25 DIAGNOSIS — M2550 Pain in unspecified joint: Secondary | ICD-10-CM | POA: Insufficient documentation

## 2022-04-25 DIAGNOSIS — C541 Malignant neoplasm of endometrium: Secondary | ICD-10-CM | POA: Insufficient documentation

## 2022-04-25 DIAGNOSIS — Z809 Family history of malignant neoplasm, unspecified: Secondary | ICD-10-CM | POA: Diagnosis not present

## 2022-04-25 DIAGNOSIS — B3731 Acute candidiasis of vulva and vagina: Secondary | ICD-10-CM | POA: Diagnosis not present

## 2022-04-25 DIAGNOSIS — Z5112 Encounter for antineoplastic immunotherapy: Secondary | ICD-10-CM | POA: Diagnosis present

## 2022-04-25 DIAGNOSIS — L03115 Cellulitis of right lower limb: Secondary | ICD-10-CM

## 2022-04-25 DIAGNOSIS — E119 Type 2 diabetes mellitus without complications: Secondary | ICD-10-CM | POA: Insufficient documentation

## 2022-04-25 DIAGNOSIS — R5383 Other fatigue: Secondary | ICD-10-CM | POA: Insufficient documentation

## 2022-04-25 DIAGNOSIS — Z886 Allergy status to analgesic agent status: Secondary | ICD-10-CM | POA: Diagnosis not present

## 2022-04-25 DIAGNOSIS — N289 Disorder of kidney and ureter, unspecified: Secondary | ICD-10-CM | POA: Diagnosis not present

## 2022-04-25 DIAGNOSIS — Z881 Allergy status to other antibiotic agents status: Secondary | ICD-10-CM | POA: Diagnosis not present

## 2022-04-25 LAB — CBC WITH DIFFERENTIAL/PLATELET
Abs Immature Granulocytes: 0.03 10*3/uL (ref 0.00–0.07)
Basophils Absolute: 0 10*3/uL (ref 0.0–0.1)
Basophils Relative: 0 %
Eosinophils Absolute: 0.3 10*3/uL (ref 0.0–0.5)
Eosinophils Relative: 4 %
HCT: 32.9 % — ABNORMAL LOW (ref 36.0–46.0)
Hemoglobin: 10.5 g/dL — ABNORMAL LOW (ref 12.0–15.0)
Immature Granulocytes: 0 %
Lymphocytes Relative: 14 %
Lymphs Abs: 0.9 10*3/uL (ref 0.7–4.0)
MCH: 30.5 pg (ref 26.0–34.0)
MCHC: 31.9 g/dL (ref 30.0–36.0)
MCV: 95.6 fL (ref 80.0–100.0)
Monocytes Absolute: 0.6 10*3/uL (ref 0.1–1.0)
Monocytes Relative: 9 %
Neutro Abs: 4.9 10*3/uL (ref 1.7–7.7)
Neutrophils Relative %: 73 %
Platelets: 228 10*3/uL (ref 150–400)
RBC: 3.44 MIL/uL — ABNORMAL LOW (ref 3.87–5.11)
RDW: 13.8 % (ref 11.5–15.5)
WBC: 6.8 10*3/uL (ref 4.0–10.5)
nRBC: 0 % (ref 0.0–0.2)

## 2022-04-25 LAB — COMPREHENSIVE METABOLIC PANEL
ALT: 16 U/L (ref 0–44)
AST: 24 U/L (ref 15–41)
Albumin: 3.8 g/dL (ref 3.5–5.0)
Alkaline Phosphatase: 55 U/L (ref 38–126)
Anion gap: 11 (ref 5–15)
BUN: 35 mg/dL — ABNORMAL HIGH (ref 8–23)
CO2: 21 mmol/L — ABNORMAL LOW (ref 22–32)
Calcium: 9.7 mg/dL (ref 8.9–10.3)
Chloride: 105 mmol/L (ref 98–111)
Creatinine, Ser: 1.03 mg/dL — ABNORMAL HIGH (ref 0.44–1.00)
GFR, Estimated: 58 mL/min — ABNORMAL LOW (ref >60)
Glucose, Bld: 142 mg/dL — ABNORMAL HIGH (ref 70–99)
Potassium: 4 mmol/L (ref 3.5–5.1)
Sodium: 137 mmol/L (ref 135–145)
Total Bilirubin: 0.3 mg/dL (ref 0.3–1.2)
Total Protein: 7.2 g/dL (ref 6.5–8.1)

## 2022-04-25 MED ORDER — SODIUM CHLORIDE 0.9% FLUSH
10.0000 mL | INTRAVENOUS | Status: DC | PRN
  Administered 2022-04-25: 10 mL via INTRAVENOUS
  Filled 2022-04-25: qty 10

## 2022-04-25 MED ORDER — SODIUM CHLORIDE 0.9 % IV SOLN
200.0000 mg | Freq: Once | INTRAVENOUS | Status: AC
  Administered 2022-04-25: 200 mg via INTRAVENOUS
  Filled 2022-04-25: qty 8

## 2022-04-25 MED ORDER — HEPARIN SOD (PORK) LOCK FLUSH 100 UNIT/ML IV SOLN
500.0000 [IU] | Freq: Once | INTRAVENOUS | Status: AC | PRN
  Administered 2022-04-25: 500 [IU]
  Filled 2022-04-25: qty 5

## 2022-04-25 MED ORDER — SODIUM CHLORIDE 0.9 % IV SOLN
Freq: Once | INTRAVENOUS | Status: AC
  Filled 2022-04-25: qty 250

## 2022-04-25 NOTE — Progress Notes (Signed)
Since last visit, she developed cellulitis on right foot and was prescribed antibiotic. Pt reports reaction doxycycline and is currently not taking any antibiotics at the moment. Pt also reports that she had a fall. No injuries.

## 2022-04-25 NOTE — Assessment & Plan Note (Signed)
lipid poor hyperfunctioning adenoma vs mets.  FDG avid on PET scan.  Difficult biopsy due to patient's body habitus and deep position of the mass. Resolved on PET scan, suggesting that this lesion might be a metastatic lesion. Continue monitor

## 2022-04-25 NOTE — Assessment & Plan Note (Addendum)
s/p carboplatin and Taxol for 5 cycles, on Keytruda maintenance.  CT showed NED Labs are reviewed and discussed with patient. Proceed with maintenance.  Keytruda  Repeat CT

## 2022-04-25 NOTE — Progress Notes (Addendum)
Hematology/Oncology Progress note [phone] [phone]      CHIEF COMPLAINTS/REASON FOR VISIT:  Follow up for endometrial cancer  ASSESSMENT & PLAN:   Cancer Staging  Endometrial adenocarcinoma [facility]) Staging form: Corpus Uteri - Carcinoma and Carcinosarcoma, AJCC 8th Edition - Clinical stage from 04/10/2021: FIGO Stage III, calculated as Stage Unknown (cT3, cNX, cM0) - Signed by Earlie Server, MD on 08/24/2021   Endometrial adenocarcinoma ([place]) s/p carboplatin and Taxol for 5 cycles, on Keytruda maintenance.  CT showed NED Labs are reviewed and discussed with patient. Proceed with maintenance.  Keytruda  Repeat CT  Adrenal mass (HCC) lipid poor hyperfunctioning adenoma vs mets.  FDG avid on PET scan.  Difficult biopsy due to patient's body habitus and deep position of the mass. Resolved on PET scan, suggesting that this lesion might be a metastatic lesion. Continue monitor   Anemia due to antineoplastic chemotherapy She is currently on immunotherapy.  Hemoglobin improved.   Encounter for antineoplastic immunotherapy Treatment plan as listed above.   Venous insufficiency of both lower extremities Refer to vascular surgey   Orders Placed This Encounter  Procedures   CBC with Differential    Standing Status:   Future    Standing Expiration Date:   06/07/2023   Comprehensive metabolic panel    Standing Status:   Future    Standing Expiration Date:   06/07/2023   Ambulatory referral to Vascular Surgery    Referral Priority:   Routine    Referral Type:   Surgical    Referral Reason:   Specialty Services Required    Requested Specialty:   Vascular Surgery    Number of Visits Requested:   1     Return of visit:  3 weeks Lab MD Octaviano Batty, MD, [name] [facility] Hematology Oncology 04/25/2022     HISTORY OF PRESENTING ILLNESS:   Valerie Case is a  72 y.o.  female presents for follow up of  FIGO grade 3 poorly differentiated  endometrial adenocarcinoma. Oncology History  Endometrial adenocarcinoma ([place])  12/12/2020 Imaging   12/12/2020, CT abdomen pelvis with contrast showed no radiographic evidence of urinary tract neoplasm, calculi, hydronephrosis.  2.6 cm nonspecific left adrenal mass. 02/12/2021 CT hematuria work-up showed small uterine fibroids, no findings to explain hematuria.Hepatomegaly, diverticulosis without evidence of diverticulitis, Coronary artery disease   04/01/2021 -  Hospital Admission   04/01/2021 - 04/03/2021, patient was hospitalized due to symptomatic anemia, hemoglobin 6.9, heavy postmenopausal bleeding with passing large clots.  She also presented with increased creatinine level to 2.58 compared to her baseline level of 0.9 in November 2022.  Patient received IV iron infusion and 2 units of PRBC during the hospital stay..  04/02/2021, iron panel showed iron saturation 37, ferritin 37, TIBC 333-the studies were done after patient received blood transfusion.  At discharge, hemoglobin was 7.7.   04/01/2021 Initial Diagnosis   04/01/2020, endometrial biopsy showed poorly differentiated endometrial adenocarcinoma. Omniseq NGS showed TMB 48.4 mt/mb [high], MSI High, PD-L1-TPS <1%, PIK3CA [id number], Negative for BRAF, HER2, NTRK1 RET.     04/10/2021 Cancer Staging   Staging form: Corpus Uteri - Carcinoma and Carcinosarcoma, AJCC 8th Edition - Clinical stage from 04/10/2021: FIGO Stage III, calculated as Stage Unknown (cT3, cNX, cM0) - Signed by Earlie Server, MD on 08/24/2021 Stage prefix: Initial diagnosis   04/15/2021 Imaging   PET showed 1. Large hypermetabolic endometrial mass consistent with known endometrial cancer. Suspect direct invasion/involvement of the right adnexa. Right pelvic  sidewall hypermetabolic adenopathy.2. 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 3. No findings for metastatic disease involving the  chest or bonystructures.     04/24/2021 - 05/29/2021 Radiation Therapy   status post radiation to pelvis   08/05/2021 Imaging   MRI abdomen w wo contrast 1. Stable solid enhancing 2.7 cm left adrenal lesion, which was hypermetabolic on prior PET/CTs but is unchanged in size dating back to December 12, 2020. 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 However, unfortunately metastatic disease can not be entirely excluded and remains a pertinent differential consideration. Comparison with more remote prior imaging would be the most valuable tool in the assessment of this lesion, as demonstrating long-term stability would indicate this to be a benign lesion. However, if no prior imaging can be made available, would consider follow-up adrenal protocol CT with and without intravenous contrast material in 3 months as this would allow for further assessment of stability as well as enhancement and washout characteristics of the lesion potentially allowing for better characterization versus direct tissue sampling.2. Hepatomegaly and hepatic steatosis.3. Colonic diverticulosis without findings of acute diverticulitis    08/16/2021 - 09/27/2021 Chemotherapy   UTERINE Carboplatin AUC 5 / Paclitaxel q21d x 3      10/03/2021 Imaging   PET 1. No residual hypermetabolism in the uterus suggesting an excellent response to treatment. No findings for metastatic disease. 2. Resolution of hypermetabolism in the left adrenal gland lesion suggesting this was metastatic disease. 3. Diffuse marrow hypermetabolism likely due to rebound from chemotherapy or marrow stimulating drugs   10/18/2021 - 11/08/2021 Chemotherapy   AUC 5 / Paclitaxel/ Keytruda Q21d  x 2 cycles   11/29/2021 -  Chemotherapy   Patient is on Treatment Plan : UTERINE Pembrolizumab (200) q21d          INTERVAL HISTORY Valerie Case is a 72 y.o. female who has above history reviewed by me today presents for follow up visit for anemia and endometrial cancer Overall she tolerates treatment, No new complaints.  Patient denies nausea vomiting diarrhea. She saw PCP yesterday and took Doxycycline for cellulitis on right foot, still has blisters.   Review of Systems  Constitutional:  Positive for fatigue. Negative for chills and fever.  HENT:   Negative for hearing loss and voice change.   Eyes:  Negative for eye problems.  Respiratory:  Negative for chest tightness and cough.   Cardiovascular:  Negative for chest pain.  Gastrointestinal:  Negative for abdominal distention, abdominal pain and blood in stool.  Endocrine: Negative for hot flashes.  Genitourinary:  Negative for difficulty urinating, frequency and vaginal bleeding.   Musculoskeletal:  Positive for arthralgias.       Blisters on dorsum of right foot.  Skin:  Negative for itching and rash.  Neurological:  Negative for extremity weakness.  Hematological:  Negative for adenopathy.  Psychiatric/Behavioral:  Negative for confusion.     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    SURGICAL HISTORY: Past Surgical  History:  Procedure Laterality Date   CESAREAN SECTION  01/23/1979   IR IMAGING GUIDED PORT INSERTION  08/09/2021   TONSILLECTOMY  03/17/1956    SOCIAL HISTORY: Social History   Socioeconomic History   Marital status: Divorced    Spouse name: Not on file   Number of children: Not on file   Years of education: Not on file   Highest education level: Not on file  Occupational History   Not on file  Tobacco Use   Smoking  status: Never   Smokeless tobacco: Never  Vaping Use   Vaping Use: Never used  Substance and Sexual Activity   Alcohol use: Not Currently   Drug use: Never   Sexual activity: Not Currently  Other Topics Concern   Not on file  Social History Narrative      Social Determinants of Health   Financial Resource Strain: Low Risk  (09/27/2021)   Overall Financial Resource Strain (CARDIA)    Difficulty of Paying Living Expenses: Not very hard  Food Insecurity: No Food Insecurity (09/27/2021)   Hunger Vital Sign    Worried About Running Out of Food in the Last Year: Never true    Ran Out of Food in the Last Year: Never true  Transportation Needs: No Transportation Needs (09/27/2021)   PRAPARE - Hydrologist (Medical): No    Lack of Transportation (Non-Medical): No  Physical Activity: Inactive (09/27/2021)   Exercise Vital Sign    Days of Exercise per Week: 0 days    Minutes of Exercise per Session: 0 min  Stress: Stress Concern Present (09/27/2021)   [place]    Feeling of Stress : Rather much  Social Connections: Moderately Integrated (09/27/2021)   Social Connection and Isolation Panel [NHANES]    Frequency of Communication with Friends and Family: Three times a week    Frequency of Social Gatherings with Friends and Family: Three times a week    Attends Religious Services: 1 to 4 times per year    Active Member of Clubs or Organizations: No    Attends Archivist Meetings: 1 to 4 times per year    Marital Status: Divorced  Intimate Partner Violence: Not At Risk (09/27/2021)   Humiliation, Afraid, Rape, and Kick questionnaire    Fear of Current or Ex-Partner: No    Emotionally Abused: No    Physically Abused: No    Sexually Abused: No    FAMILY HISTORY: Family History  Problem Relation Age of Onset   Breast cancer Mother 56   Cancer Mother    Cancer Father    Lung cancer  Father    Breast cancer Cousin        dx 30s maternal    ALLERGIES:  is allergic to doxycycline and aspirin.  MEDICATIONS:  Current Outpatient Medications  Medication Sig Dispense Refill   acetaminophen (TYLENOL) 650 MG CR tablet Take 1,300 mg by mouth every 8 (eight) hours as needed for pain.     albuterol (VENTOLIN HFA) 108 (90 Base) MCG/ACT inhaler Inhale 2 puffs into the lungs every 6 (six) hours as needed for wheezing or shortness of breath.     Cranberry-Vitamin C-Probiotic (AZO CRANBERRY PO) Take by mouth.     docusate sodium (COLACE) 100 MG capsule Take 1 capsule (100 mg total) by mouth 2 (two) times daily. 60 capsule 1   empagliflozin (JARDIANCE) 25 MG TABS tablet Take 25 mg by  mouth daily.     ferrous sulfate 325 (65 FE) MG EC tablet Take 325 mg by mouth daily with breakfast.     FLUoxetine (PROZAC) 10 MG capsule Take 10 mg by mouth daily.     fluticasone (FLONASE) 50 MCG/ACT nasal spray Place 1 spray into both nostrils daily as needed for allergies or rhinitis.     hydrochlorothiazide (HYDRODIURIL) 12.5 MG tablet Take 12.5 mg by mouth daily.     hydrocortisone 2.5 % cream Apply topically 2 (two) times daily. 30 g 2   ibuprofen (ADVIL) 200 MG tablet Take 400 mg by mouth every 8 (eight) hours as needed for moderate pain.     lidocaine-prilocaine (EMLA) cream Apply to affected area once 30 g 3   lisinopril (ZESTRIL) 40 MG tablet Take 40 mg by mouth at bedtime.     LORazepam (ATIVAN) 0.5 MG tablet Take 1 tablet (0.5 mg total) by mouth every 8 (eight) hours as needed for anxiety (nausea vomiting). 30 tablet 0   metFORMIN (GLUCOPHAGE-XR) 500 MG 24 hr tablet Take 1,000 mg by mouth at bedtime.     metoprolol succinate (TOPROL-XL) 25 MG 24 hr tablet Take 25 mg by mouth daily.     NOVOLIN 70/30 FLEXPEN (70-30) 100 UNIT/ML KwikPen 50-110 Units See admin instructions. 50 units in the morning, 110 units at bedtime     ondansetron (ZOFRAN) 8 MG tablet Take 1 tablet (8 mg total) by mouth 2  (two) times daily as needed. Start on the third day after chemotherapy. 30 tablet 1   OZEMPIC, 1 MG/DOSE, 4 MG/3ML SOPN Inject 1 mg into the skin every Tuesday.     prochlorperazine (COMPAZINE) 10 MG tablet Take 1 tablet (10 mg total) by mouth every 6 (six) hours as needed (Nausea or vomiting). 30 tablet 1   rosuvastatin (CRESTOR) 10 MG tablet Take 10 mg by mouth daily.     traMADol (ULTRAM) 50 MG tablet Take 2 tablets (100 mg total) by mouth every 6 (six) hours as needed. 90 tablet 0   potassium chloride SA (KLOR-CON M) 20 MEQ tablet Take 1 tablet (20 mEq total) by mouth daily. (Patient not taking: Reported on 01/31/2022) 3 tablet 0   No current facility-administered medications for this visit.   Facility-Administered Medications Ordered in Other Visits  Medication Dose Route Frequency Provider Last Rate Last Admin   heparin lock flush 100 UNIT/ML injection              PHYSICAL EXAMINATION: ECOG PERFORMANCE STATUS: 1 - Symptomatic but completely ambulatory  Physical Exam Constitutional:      General: She is not in acute distress.    Appearance: She is obese.  HENT:     Head: Normocephalic and atraumatic.  Eyes:     General: No scleral icterus. Cardiovascular:     Rate and Rhythm: Normal rate and regular rhythm.     Heart sounds: Normal heart sounds.  Pulmonary:     Effort: Pulmonary effort is normal. No respiratory distress.     Breath sounds: No wheezing.  Abdominal:     General: Bowel sounds are normal. There is no distension.     Palpations: Abdomen is soft.  Musculoskeletal:        General: No deformity. Normal range of motion.     Cervical back: Normal range of motion and neck supple.     Right lower leg: Edema present.     Left lower leg: Edema present.     Comments:  Skin:    General: Skin is warm and dry.     Findings: No erythema or rash.     Comments: Chronic venous sufficiency skin changes  Neurological:     Mental Status: She is alert and oriented to  person, place, and time. Mental status is at baseline.     Cranial Nerves: No cranial nerve deficit.     Coordination: Coordination normal.  Psychiatric:        Mood and Affect: Mood normal.      LABORATORY DATA:  I have reviewed the data as listed    Latest Ref Rng & Units 04/25/2022    8:30 AM 04/04/2022    8:27 AM 03/14/2022    8:20 AM  CBC  WBC 4.0 - 10.5 K/uL 6.8  6.4  6.6   Hemoglobin 12.0 - 15.0 g/dL 10.5  11.0  10.9   Hematocrit 36.0 - 46.0 % 32.9  34.1  33.6   Platelets 150 - 400 K/uL 228  262  261       Latest Ref Rng & Units 04/25/2022    8:30 AM 04/04/2022    8:27 AM 03/14/2022    8:20 AM  CMP  Glucose 70 - 99 mg/dL 142  189  200   BUN 8 - 23 mg/dL 35  33  34   Creatinine 0.44 - 1.00 mg/dL 1.03  0.99  1.24   Sodium 135 - 145 mmol/L 137  136  139   Potassium 3.5 - 5.1 mmol/L 4.0  4.0  4.2   Chloride 98 - 111 mmol/L 105  106  108   CO2 22 - 32 mmol/L 21  20  21   $ Calcium 8.9 - 10.3 mg/dL 9.7  9.4  9.9   Total Protein 6.5 - 8.1 g/dL 7.2  7.2  7.4   Total Bilirubin 0.3 - 1.2 mg/dL 0.3  0.3  0.3   Alkaline Phos 38 - 126 U/L 55  63  62   AST 15 - 41 U/L 24  24  24   $ ALT 0 - 44 U/L 16  14  16       $ RADIOGRAPHIC STUDIES: I have personally reviewed the radiological images as listed and agreed with the findings in the report. No results found.

## 2022-04-25 NOTE — Assessment & Plan Note (Signed)
She is currently on immunotherapy.  Hemoglobin improved.

## 2022-04-25 NOTE — Assessment & Plan Note (Signed)
Treatment plan as listed above. 

## 2022-04-25 NOTE — Assessment & Plan Note (Signed)
Refer to vascular surgey

## 2022-04-29 ENCOUNTER — Other Ambulatory Visit: Payer: Self-pay | Admitting: Oncology

## 2022-04-30 ENCOUNTER — Ambulatory Visit
Admission: RE | Admit: 2022-04-30 | Discharge: 2022-04-30 | Disposition: A | Discharge status: Home / Self Care | Payer: Medicare PPO | Source: Ambulatory Visit | Attending: Oncology | Admitting: Oncology

## 2022-04-30 DIAGNOSIS — C541 Malignant neoplasm of endometrium: Secondary | ICD-10-CM | POA: Insufficient documentation

## 2022-04-30 MED ORDER — IOHEXOL 300 MG/ML  SOLN
100.0000 mL | Freq: Once | INTRAMUSCULAR | Status: AC | PRN
  Administered 2022-04-30: 100 mL via INTRAVENOUS

## 2022-05-02 ENCOUNTER — Other Ambulatory Visit: Payer: Self-pay | Admitting: Oncology

## 2022-05-07 ENCOUNTER — Inpatient Hospital Stay (HOSPITAL_BASED_OUTPATIENT_CLINIC_OR_DEPARTMENT_OTHER): Payer: Medicare PPO | Admitting: Nurse Practitioner

## 2022-05-07 VITALS — BP 160/60 | HR 102 | Temp 97.8°F | Resp 20 | Wt 273.6 lb

## 2022-05-07 DIAGNOSIS — Z5112 Encounter for antineoplastic immunotherapy: Secondary | ICD-10-CM | POA: Diagnosis not present

## 2022-05-07 DIAGNOSIS — B3731 Acute candidiasis of vulva and vagina: Secondary | ICD-10-CM

## 2022-05-07 DIAGNOSIS — C541 Malignant neoplasm of endometrium: Secondary | ICD-10-CM | POA: Diagnosis not present

## 2022-05-07 MED ORDER — FLUCONAZOLE 150 MG PO TABS: 150.0000 mg | ORAL_TABLET | Freq: Every day | ORAL | 0 refills | Status: DC

## 2022-05-07 NOTE — Progress Notes (Signed)
Gynecologic Oncology Interval Visit   Referring Provider: Dr. Glennon Mac  Chief Complaint: FIGO grade III endometrial adenocarcinoma  Subjective:  Valerie Case is a 72 y.o. female who is seen in consultation from Dr. Glennon Mac for poorly differentiated endometrial adenocarcinoma, figo grade III, s/p radiation x 5 weeks to control bleeding, felt to be poor surgical candidate, received chemotherapy who presents to clinic for follow up.   She is currently on maintenance pembrolizumab and doing well. She walked into clinic with a cane today. Previously she had come to clinic in a wheelchair. Her last imaging study 04/30/22 was very reassuring. Clinically, she is feeling well and denies concerning symptoms. No vaginal bleeding or discharge. Has taken antibiotics for foot infection recently. No itching.     Gynecologic Oncology History Valerie Case is a pleasant female who is seen in consultation from Dr. Glennon Mac for poorly differentiated endometrial adenocarcinoma, figo grade III. Please see prior notes for complete detail  04/01/21 She was admitted  to [facility] with vaginal bleeding and received 2 units pRBCs and iron infusion. She underwent endometrial biopsy on which revealed poorly differentiated endometrial adenocarcinoma FIGO III  04/02/2021 BILATERAL LOWER EXTREMITY VENOUS DOPPLER ULTRASOUND No evidence of deep venous thrombosis in either lower extremity.  04/16/2021 PET IMPRESSION: 1. Large hypermetabolic endometrial mass consistent with known endometrial cancer. Suspect direct invasion/involvement of the right adnexa. Right pelvic sidewall hypermetabolic adenopathy. 2. 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 3. No findings for metastatic disease involving the chest or bony structures.  04/24/2021 Tumor Board Documentation Patient to undergo radiation therapy. Plan to reassess after  radiation with imaging to re-evaluate possible surgical intervention.     04/24/2021 - 05/29/2021: She completed WPRT Dose/Fx (Gy): 1.8 #Fx: 25 / 25. Total Dose (Gy): 45  06/12/2021 PET IMPRESSION: 1. Substantial reduction in size and activity of the uterine mass. Substantial reduction in size and activity of the right pelvic sidewall lymph node. 2. The moderately hypermetabolic left adrenal mass is stable in size back through earliest available compare comparison of 12/12/2020, with roughly similar activity level to previous. The density characteristics are not specific for adrenal adenoma. Possibilities include benign and malignant adrenal neoplasms versus metastatic lesion. 3. Other imaging findings of potential clinical significance: Left mastoid effusion. Coronary and aortic atherosclerosis. Systemic atherosclerosis. Mild cardiomegaly. Lax anterior abdominal wall. Suspected cholelithiasis. Sigmoid colon diverticulosis. Congenital anomalies of the C1 vertebra. Anterolisthesis at L4-5.  Hypermetabolic left adrenal mass has been worked up for possible pheochromocytoma which was negative. She was referred to urology and saw Dr. Bernardo Heater who recommended CT guided biopsy but this was refused by IR.   She has been followed by Dr. Tasia Catchings for iron deficiency anemia secondary to blood loss in setting of endometrial cancer. She received IV iron and palliative radiation. Bleeding has stopped and hemoglobin has improved to 11.4. Blood sugars have been elevated and last HmgA1c was 7.9 (07/18/21)  08/05/2021 Imaging    MRI abdomen w wo contrast 1. Stable solid enhancing 2.7 cm left adrenal lesion, which was hypermetabolic on prior PET/CTs but is unchanged in size dating back to December 12, 2020. 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 However,  unfortunately metastatic disease can not be entirely excluded and remains a pertinent differential consideration. Comparison with more remote prior imaging  would be the most valuable tool in the assessment of this lesion, as demonstrating long-term stability would indicate this to be a benign lesion. However, if no prior imaging can be made available, would consider follow-up adrenal protocol CT with and without intravenous contrast material in 3 months as this would allow for further assessment of stability as well as enhancement and washout characteristics of the lesion potentially allowing for better characterization versus direct tissue sampling.2. Hepatomegaly and hepatic steatosis.3. Colonic diverticulosis without findings of acute diverticulitis      Her case was discussed at Tumor Board. Dr. Fransisca Connors felt like she would be a candidate for surgery. After further discussion she opted for non-surgical management.    08/16/2021 - 09/27/2021  completed  3 cycles of carbo-paclitaxel chemotherapy.   PET 10/03/21  - no residual hypermetabolism of the uterus suggestive of excellent response to treatment. No findings of metastatic disease. Resolution of hypermetabolism of left adrenal lesions. Diffuse marrow hypermetabolism likely secondary to udenyca/GCSF vs chemotherapy.   10/18/2021 - 11/08/2021 completed  2 cycles of carbo-paclitaxel chemotherapy.  11/29/2021 started on pembrolizumab 200 mg every 3 weeks.   01/22/2022 CT C/A/P IMPRESSION: 1. No findings of progressive metastatic disease within the chest, abdomen, or pelvis. 2. Left adrenal nodule is similar in size to 10/03/2021 PET. This was felt to be suspicious for treated metastasis on prior PET. 3. Hepatomegaly 4. Coronary artery atherosclerosis. Aortic Atherosclerosis (ICD10-I70.0). 5.  Tiny hiatal hernia.   Tumor markers 08/20/21- Omn[employer] NGS showed TMB 48.4 mt/mb [high], MSI High, PD-L1-TPS <1%, PIK3CA H1047Q, Negative for BRAF, HER2, NTRK1  RET. MMR was not reported.   .        Problem List: Patient Active Problem List   Diagnosis Date Noted   Encounter for antineoplastic immunotherapy 12/20/2021   Encounter for antineoplastic chemotherapy 08/24/2021   Morbid obesity with body mass index (BMI) of 40.0 to 44.9 in adult ([place]) 08/24/2021   Anemia due to antineoplastic chemotherapy 08/24/2021   Drug-induced constipation 08/23/2021   Port-A-Cath in place 08/15/2021   Venous insufficiency of both lower extremities 05/20/2021   Hepatomegaly 04/14/2021   Endometrial adenocarcinoma ([place]) 04/14/2021   Adrenal mass ([place]) 04/14/2021   Goals of care, counseling/discussion 04/14/2021   B12 deficiency 04/11/2021   AKI (acute kidney injury) ([place]) 04/01/2021   Essential hypertension 04/01/2021   Insulin dependent type 2 diabetes mellitus ([place]) 04/01/2021   Depression 04/01/2021   Recent unintentional weight loss over several months 04/01/2021   History of asthma 04/01/2021   Bilateral lower extremity edema 04/01/2021     Past Medical History: Past Medical History:  Diagnosis Date   Asthma    Cancer ([place])    Depression 04/01/2021   Diabetes mellitus without complication ([place])    Essential hypertension 04/01/2021   Hx of dysplastic nevus 07/16/2010   RLQA   Hypertension    Insulin dependent type 2 diabetes mellitus ([place]) 04/01/2021    Past Surgical History: Past Surgical History:  Procedure Laterality Date   CESAREAN SECTION  01/23/1979   IR IMAGING GUIDED PORT INSERTION  08/09/2021   TONSILLECTOMY  03/17/1956    Past Gynecologic History:  Post menopausal - 2006 STD: denies   OB History:  OB History  Gravida Para Term Preterm AB Living  1 1 1     1  $ SAB IAB Ectopic Multiple Live Births          1    # Outcome Date GA Lbr Len/2nd Weight Sex Delivery Anes PTL Lv  1 Term 1980     CS-Unspec   LIV    Family History: Family History  Problem Relation Age of Onset   Breast cancer Mother 52   Cancer Mother     Cancer Father    Lung cancer Father    Breast cancer Cousin        dx 61s maternal    Social History: Social History   Socioeconomic History   Marital status: Divorced    Spouse name: Not on file   Number of children: Not on file   Years of education: Not on file   Highest education level: Not on file  Occupational History   Not on file  Tobacco Use   Smoking status: Never   Smokeless tobacco: Never  Vaping Use   Vaping Use: Never used  Substance and Sexual Activity   Alcohol use: Not Currently   Drug use: Never   Sexual activity: Not Currently  Other Topics Concern   Not on file  Social History Narrative      Social Determinants of Health   Financial Resource Strain: Low Risk  (09/27/2021)   Overall Financial Resource Strain (CARDIA)    Difficulty of Paying Living Expenses: Not very hard  Food Insecurity: No Food Insecurity (09/27/2021)   Hunger Vital Sign    Worried About Running Out of Food in the Last Year: Never true    Ran Out of Food in the Last Year: Never true  Transportation Needs: No Transportation Needs (09/27/2021)   PRAPARE - Hydrologist (Medical): No    Lack of Transportation (Non-Medical): No  Physical Activity: Inactive (09/27/2021)   Exercise Vital Sign    Days of Exercise per Week: 0 days    Minutes of Exercise per Session: 0 min  Stress: Stress Concern Present (09/27/2021)   [place]    Feeling of Stress : Rather much  Social Connections: Moderately Integrated (09/27/2021)   Social Connection and Isolation Panel [NHANES]    Frequency of Communication with Friends and Family: Three times a week    Frequency of Social Gatherings with Friends and Family: Three times a week    Attends Religious Services: 1 to 4 times per year    Active Member of Clubs or Organizations: No    Attends Archivist Meetings: 1 to 4 times per year    Marital  Status: Divorced  Intimate Partner Violence: Not At Risk (09/27/2021)   Humiliation, Afraid, Rape, and Kick questionnaire    Fear of Current or Ex-Partner: No    Emotionally Abused: No    Physically Abused: No    Sexually Abused: No    Allergies: Allergies  Allergen Reactions   Doxycycline     Thrush and mouth sores    Aspirin Rash    Childhood reaction     Current Medications: Current Outpatient Medications  Medication Sig Dispense Refill   acetaminophen (TYLENOL) 650 MG CR tablet Take 1,300 mg by mouth every 8 (eight) hours as needed for pain.     albuterol (VENTOLIN HFA) 108 (90 Base) MCG/ACT inhaler Inhale 2 puffs into the lungs every 6 (six) hours as needed for wheezing or shortness of breath.     Cranberry-Vitamin C-Probiotic (AZO CRANBERRY PO) Take by mouth.     docusate sodium (COLACE) 100 MG capsule Take 1 capsule (100 mg total) by mouth 2 (two) times daily. 60 capsule 1   empagliflozin (JARDIANCE) 25  MG TABS tablet Take 25 mg by mouth daily.     ferrous sulfate 325 (65 FE) MG EC tablet Take 325 mg by mouth daily with breakfast.     FLUoxetine (PROZAC) 10 MG capsule Take 10 mg by mouth daily.     fluticasone (FLONASE) 50 MCG/ACT nasal spray Place 1 spray into both nostrils daily as needed for allergies or rhinitis.     hydrochlorothiazide (HYDRODIURIL) 12.5 MG tablet Take 12.5 mg by mouth daily.     hydrocortisone 2.5 % cream Apply topically 2 (two) times daily. 30 g 2   ibuprofen (ADVIL) 200 MG tablet Take 400 mg by mouth every 8 (eight) hours as needed for moderate pain.     lidocaine-prilocaine (EMLA) cream Apply to affected area once 30 g 3   lisinopril (ZESTRIL) 40 MG tablet Take 40 mg by mouth at bedtime.     LORazepam (ATIVAN) 0.5 MG tablet Take 1 tablet (0.5 mg total) by mouth every 8 (eight) hours as needed for anxiety (nausea vomiting). 30 tablet 0   metFORMIN (GLUCOPHAGE-XR) 500 MG 24 hr tablet Take 1,000 mg by mouth at bedtime.     metoprolol succinate  (TOPROL-XL) 25 MG 24 hr tablet Take 25 mg by mouth daily.     NOVOLIN 70/30 FLEXPEN (70-30) 100 UNIT/ML KwikPen 50-110 Units See admin instructions. 50 units in the morning, 110 units at bedtime     ondansetron (ZOFRAN) 8 MG tablet Take 1 tablet (8 mg total) by mouth 2 (two) times daily as needed. Start on the third day after chemotherapy. 30 tablet 1   OZEMPIC, 1 MG/DOSE, 4 MG/3ML SOPN Inject 1 mg into the skin every Tuesday.     potassium chloride SA (KLOR-CON M) 20 MEQ tablet Take 1 tablet (20 mEq total) by mouth daily. (Patient not taking: Reported on 01/31/2022) 3 tablet 0   prochlorperazine (COMPAZINE) 10 MG tablet Take 1 tablet (10 mg total) by mouth every 6 (six) hours as needed (Nausea or vomiting). 30 tablet 1   rosuvastatin (CRESTOR) 10 MG tablet Take 10 mg by mouth daily.     traMADol (ULTRAM) 50 MG tablet Take 2 tablets (100 mg total) by mouth every 6 (six) hours as needed. 90 tablet 0   No current facility-administered medications for this visit.   Facility-Administered Medications Ordered in Other Visits  Medication Dose Route Frequency Provider Last Rate Last Admin   heparin lock flush 100 UNIT/ML injection             Review of Systems General:  no complaints Skin: no complaints Eyes: no complaints HEENT: no complaints Breasts: no complaints Pulmonary: no complaints Cardiac: no complaints Gastrointestinal: no complaints Genitourinary/Sexual: no complaints Ob/Gyn: no complaints Musculoskeletal: no complaints Hematology: no complaints Neurologic/Psych: no complaints    Objective:  Physical Examination:  BP (!) 160/60   Pulse (!) 102   Temp 97.8 F (36.6 C)   Resp 20   Wt 273 lb 9.6 oz (124.1 kg)   SpO2 99%   BMI 41.60 kg/m    Performance status: 1  GENERAL: Patient is a well appearing female in no acute distress HEENT:  Sclerae anicteric.   NODES:  No cervical, supraclavicular, or axillary lymphadenopathy palpated.  LUNGS:  Clear to auscultation  bilaterally.  No wheezes or rhonchi. HEART:  Regular rate and rhythm. No murmur appreciated. ABDOMEN:  Soft, nontender.  Positive, normoactive bowel sounds. No organomegaly palpated. MSK:  No focal spinal tenderness to palpation. Cane for ambulation. Slowed gait.  EXTREMITIES:  hemosiderin staining bilateral lower extremities SKIN:  Clear with no obvious rashes or skin changes.  NEURO:  Nonfocal. Well oriented.  Appropriate affect. Anxious appearing  Pelvic: Exam chaperoned by CMA EGBUS: benign appearing flesh colored skin tag of left labia majora. Vulva: white thick film coating labia minora and vulva.  Cervix: no lesions, mobile.  Vagina: no lesions, no discharge or bleeding. Discomfort with pelvic exam on right side likely anxiety.  Uterus: normal size, nontender, mobile. Pelvic exam limited d/t habitus Adnexa: no palpable masses. Smooth Rectovaginal: declined.    Lab Review No labs on site today  Radiologic Imaging: Per HPI   Assessment:  Valerie Case is a 72 y.o. female diagnosed with metastatic poorly differentiated endometrial adenocarcinoma (MSI-H) with right sidewall and pelvic lymph node involvement s/p palliative WPRT d/t bleeding, with evidence of completed response based on imaging, and doing well on maintenance pembrolizumab.   Vulvar candidiasis.   Initial performance status = 2 poor overall performance status; no longer in a wheel chair. Not optimal surgical candidate but improving and may be able to consider in the future   Adrenal mass, uncertain etiology. Pheocromocytoma workup was negative. Biopsy recommended by urology but denied by IR. Radiology recommended adrenal MRI. Stable findings. DDX: lipid poor adenoma vs metastatic disease  History of symptomatic anemia due to bleeding and s/p blood transfusion and IV iron. Hb 10.5  Renal insufficiency (2.6 on 04/01/2021) recent creatinine 1.03  AODM, concern for suboptimal glycemic control. HbA1c 7.2 on  02/11/22  Medical co-morbidities complicating care: HTN, AODM, depression, morbid obesity, Chronic venous insufficiency of both lower extremities with h/o cellulitis, History of asthma. Body mass index is 41.6 kg/m.  Plan:   Problem List Items Addressed This Visit   None  Excellent response to chemotherapy followed by maintenance immunotherapy.  I defer to Dr. Tasia Catchings. Again reviewed that the benefit of surgery may be less compared to her overall risk based on her complete response to therapy and good tolerance to [place]. However, I will discuss with Dr Theora Gianotti to again reconsider risk vs benefit.   Again encouraged her to consider germline genetic testing. May also consider in the future but prefers to hold off for now. She met with Faith Rogue 12/04/21.   I've also recommended that she see her pcp to update her routine cancer screenings including colonoscopy.   She has appointment with vascular for evaluation of venous insufficiency of her lower extremities.   Will send diflucan 150 mg on day 1 and 3 days later for vulvar candidiasis.   Follow-up in 3 months for continued surveillance. Patient will see Dr Theora Gianotti for next visit. Prefers female providers.   The patient's diagnosis, an outline of the further diagnostic and laboratory studies which will be required, the recommendation, and alternatives were discussed.  All questions were answered to the patient's satisfaction.   We spent a total of at last 30 minutes were spent with the patient/family or documentation completion today; >50% was spent in education, counseling and coordination of care for endometrial cancer.   Thank you Dr Tasia Catchings for allowing me to participate in the care of your pleasant patient.   Beckey Rutter, DNP, AGNP-C, [place] [facility] [phone] (clinic)

## 2022-05-08 ENCOUNTER — Other Ambulatory Visit: Payer: Self-pay

## 2022-05-16 ENCOUNTER — Inpatient Hospital Stay: Payer: Medicare PPO | Admitting: Oncology

## 2022-05-16 ENCOUNTER — Encounter: Payer: Self-pay | Admitting: Oncology

## 2022-05-16 ENCOUNTER — Inpatient Hospital Stay: Payer: Medicare PPO

## 2022-05-16 ENCOUNTER — Inpatient Hospital Stay: Payer: Medicare PPO | Attending: Obstetrics and Gynecology

## 2022-05-16 VITALS — BP 127/79 | HR 98 | Temp 97.8°F | Resp 16 | Wt 278.0 lb

## 2022-05-16 DIAGNOSIS — D6481 Anemia due to antineoplastic chemotherapy: Secondary | ICD-10-CM

## 2022-05-16 DIAGNOSIS — I872 Venous insufficiency (chronic) (peripheral): Secondary | ICD-10-CM | POA: Insufficient documentation

## 2022-05-16 DIAGNOSIS — K760 Fatty (change of) liver, not elsewhere classified: Secondary | ICD-10-CM | POA: Insufficient documentation

## 2022-05-16 DIAGNOSIS — T451X5A Adverse effect of antineoplastic and immunosuppressive drugs, initial encounter: Secondary | ICD-10-CM | POA: Insufficient documentation

## 2022-05-16 DIAGNOSIS — Z803 Family history of malignant neoplasm of breast: Secondary | ICD-10-CM | POA: Diagnosis not present

## 2022-05-16 DIAGNOSIS — E119 Type 2 diabetes mellitus without complications: Secondary | ICD-10-CM | POA: Diagnosis not present

## 2022-05-16 DIAGNOSIS — Z809 Family history of malignant neoplasm, unspecified: Secondary | ICD-10-CM | POA: Diagnosis not present

## 2022-05-16 DIAGNOSIS — Z923 Personal history of irradiation: Secondary | ICD-10-CM | POA: Insufficient documentation

## 2022-05-16 DIAGNOSIS — R5383 Other fatigue: Secondary | ICD-10-CM | POA: Diagnosis not present

## 2022-05-16 DIAGNOSIS — Z5112 Encounter for antineoplastic immunotherapy: Secondary | ICD-10-CM

## 2022-05-16 DIAGNOSIS — M2550 Pain in unspecified joint: Secondary | ICD-10-CM | POA: Diagnosis not present

## 2022-05-16 DIAGNOSIS — I700 Atherosclerosis of aorta: Secondary | ICD-10-CM | POA: Diagnosis not present

## 2022-05-16 DIAGNOSIS — Z801 Family history of malignant neoplasm of trachea, bronchus and lung: Secondary | ICD-10-CM | POA: Insufficient documentation

## 2022-05-16 DIAGNOSIS — Z79620 Long term (current) use of immunosuppressive biologic: Secondary | ICD-10-CM | POA: Diagnosis not present

## 2022-05-16 DIAGNOSIS — M47817 Spondylosis without myelopathy or radiculopathy, lumbosacral region: Secondary | ICD-10-CM | POA: Diagnosis not present

## 2022-05-16 DIAGNOSIS — K5730 Diverticulosis of large intestine without perforation or abscess without bleeding: Secondary | ICD-10-CM | POA: Insufficient documentation

## 2022-05-16 DIAGNOSIS — I10 Essential (primary) hypertension: Secondary | ICD-10-CM | POA: Diagnosis not present

## 2022-05-16 DIAGNOSIS — C541 Malignant neoplasm of endometrium: Secondary | ICD-10-CM | POA: Diagnosis not present

## 2022-05-16 DIAGNOSIS — Z881 Allergy status to other antibiotic agents status: Secondary | ICD-10-CM | POA: Diagnosis not present

## 2022-05-16 DIAGNOSIS — Z79899 Other long term (current) drug therapy: Secondary | ICD-10-CM | POA: Diagnosis not present

## 2022-05-16 DIAGNOSIS — M4316 Spondylolisthesis, lumbar region: Secondary | ICD-10-CM | POA: Diagnosis not present

## 2022-05-16 DIAGNOSIS — I2510 Atherosclerotic heart disease of native coronary artery without angina pectoris: Secondary | ICD-10-CM | POA: Diagnosis not present

## 2022-05-16 DIAGNOSIS — E278 Other specified disorders of adrenal gland: Secondary | ICD-10-CM

## 2022-05-16 DIAGNOSIS — Z886 Allergy status to analgesic agent status: Secondary | ICD-10-CM | POA: Insufficient documentation

## 2022-05-16 LAB — COMPREHENSIVE METABOLIC PANEL
ALT: 13 U/L (ref 0–44)
AST: 22 U/L (ref 15–41)
Albumin: 3.8 g/dL (ref 3.5–5.0)
Alkaline Phosphatase: 61 U/L (ref 38–126)
Anion gap: 11 (ref 5–15)
BUN: 38 mg/dL — ABNORMAL HIGH (ref 8–23)
CO2: 21 mmol/L — ABNORMAL LOW (ref 22–32)
Calcium: 9.8 mg/dL (ref 8.9–10.3)
Chloride: 106 mmol/L (ref 98–111)
Creatinine, Ser: 0.91 mg/dL (ref 0.44–1.00)
GFR, Estimated: >60 mL/min (ref >60)
Glucose, Bld: 193 mg/dL — ABNORMAL HIGH (ref 70–99)
Potassium: 4.3 mmol/L (ref 3.5–5.1)
Sodium: 138 mmol/L (ref 135–145)
Total Bilirubin: 0.3 mg/dL (ref 0.3–1.2)
Total Protein: 7.2 g/dL (ref 6.5–8.1)

## 2022-05-16 LAB — CBC WITH DIFFERENTIAL/PLATELET
Abs Immature Granulocytes: 0.04 10*3/uL (ref 0.00–0.07)
Basophils Absolute: 0 10*3/uL (ref 0.0–0.1)
Basophils Relative: 1 %
Eosinophils Absolute: 0.3 10*3/uL (ref 0.0–0.5)
Eosinophils Relative: 5 %
HCT: 34.5 % — ABNORMAL LOW (ref 36.0–46.0)
Hemoglobin: 10.8 g/dL — ABNORMAL LOW (ref 12.0–15.0)
Immature Granulocytes: 1 %
Lymphocytes Relative: 17 %
Lymphs Abs: 1 10*3/uL (ref 0.7–4.0)
MCH: 30.4 pg (ref 26.0–34.0)
MCHC: 31.3 g/dL (ref 30.0–36.0)
MCV: 97.2 fL (ref 80.0–100.0)
Monocytes Absolute: 0.6 10*3/uL (ref 0.1–1.0)
Monocytes Relative: 9 %
Neutro Abs: 4.2 10*3/uL (ref 1.7–7.7)
Neutrophils Relative %: 67 %
Platelets: 256 10*3/uL (ref 150–400)
RBC: 3.55 MIL/uL — ABNORMAL LOW (ref 3.87–5.11)
RDW: 13.5 % (ref 11.5–15.5)
WBC: 6.2 10*3/uL (ref 4.0–10.5)
nRBC: 0 % (ref 0.0–0.2)

## 2022-05-16 LAB — TSH: TSH: 1.723 u[IU]/mL (ref 0.350–4.500)

## 2022-05-16 MED ORDER — SODIUM CHLORIDE 0.9% FLUSH
10.0000 mL | INTRAVENOUS | Status: DC | PRN
  Administered 2022-05-16: 10 mL
  Filled 2022-05-16: qty 10

## 2022-05-16 MED ORDER — HEPARIN SOD (PORK) LOCK FLUSH 100 UNIT/ML IV SOLN
500.0000 [IU] | Freq: Once | INTRAVENOUS | Status: AC | PRN
  Administered 2022-05-16: 500 [IU]
  Filled 2022-05-16: qty 5

## 2022-05-16 MED ORDER — SODIUM CHLORIDE 0.9 % IV SOLN
200.0000 mg | Freq: Once | INTRAVENOUS | Status: AC
  Administered 2022-05-16: 200 mg via INTRAVENOUS
  Filled 2022-05-16: qty 8

## 2022-05-16 MED ORDER — SODIUM CHLORIDE 0.9 % IV SOLN
Freq: Once | INTRAVENOUS | Status: AC
  Filled 2022-05-16: qty 250

## 2022-05-16 MED ORDER — SODIUM CHLORIDE 0.9% FLUSH
10.0000 mL | INTRAVENOUS | Status: DC | PRN
  Administered 2022-05-16: 10 mL via INTRAVENOUS
  Filled 2022-05-16: qty 10

## 2022-05-16 NOTE — Progress Notes (Signed)
Hematology/Oncology Progress note [phone] [phone]      CHIEF COMPLAINTS/REASON FOR VISIT:  Follow up for endometrial cancer  ASSESSMENT & PLAN:   Cancer Staging  Endometrial adenocarcinoma [facility]) Staging form: Corpus Uteri - Carcinoma and Carcinosarcoma, AJCC 8th Edition - Clinical stage from 04/10/2021: FIGO Stage III, calculated as Stage Unknown (cT3, cNX, cM0) - Signed by Earlie Server, MD on 08/24/2021   Endometrial adenocarcinoma ([place]) s/p carboplatin and Taxol for 5 cycles, on Keytruda maintenance.  CT showed NED Labs are reviewed and discussed with patient. Proceed with Keytruda   Adrenal mass ([place]) lipid poor hyperfunctioning adenoma vs mets.  FDG avid on PET scan.  Difficult biopsy due to patient's body habitus and deep position of the mass. Resolved on PET scan, suggesting that this lesion might be a metastatic lesion. Continue monitor   Anemia due to antineoplastic chemotherapy She is currently on immunotherapy.  Hemoglobin improved.   Encounter for antineoplastic immunotherapy Treatment plan as listed above.   Venous insufficiency of both lower extremities Refer to vascular surgey   Orders Placed This Encounter  Procedures   CBC with Differential    Standing Status:   Future    Standing Expiration Date:   06/27/2023   Comprehensive metabolic panel    Standing Status:   Future    Standing Expiration Date:   06/27/2023   CBC with Differential    Standing Status:   Future    Standing Expiration Date:   07/18/2023   Comprehensive metabolic panel    Standing Status:   Future    Standing Expiration Date:   07/18/2023   TSH    Standing Status:   Future    Standing Expiration Date:   07/18/2023     Return of visit:  3 weeks Lab MD Octaviano Batty, MD, [name] [facility] Hematology Oncology 05/16/2022     HISTORY OF PRESENTING ILLNESS:   Valerie Case is a  72 y.o.  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 Oncology History  Endometrial adenocarcinoma ([place])  12/12/2020 Imaging   12/12/2020, CT abdomen pelvis with contrast showed no radiographic evidence of urinary tract neoplasm, calculi, hydronephrosis.  2.6 cm nonspecific left adrenal mass. 02/12/2021 CT hematuria work-up showed small uterine fibroids, no findings to explain hematuria.Hepatomegaly, diverticulosis without evidence of diverticulitis, Coronary artery disease   04/01/2021 -  Hospital Admission   04/01/2021 - 04/03/2021, patient was hospitalized due to symptomatic anemia, hemoglobin 6.9, heavy postmenopausal bleeding with passing large clots.  She also presented with increased creatinine level to 2.58 compared to her baseline level of 0.9 in November 2022.  Patient received IV iron infusion and 2 units of PRBC during the hospital stay..  04/02/2021, iron panel showed iron saturation 37, ferritin 37, TIBC 333-the studies were done after patient received blood transfusion.  At discharge, hemoglobin was 7.7.   04/01/2021 Initial Diagnosis   04/01/2020, endometrial biopsy showed poorly differentiated endometrial adenocarcinoma. Omniseq NGS showed TMB 48.4 mt/mb [high], MSI High, PD-L1-TPS <1%, PIK3CA [id number], Negative for BRAF, HER2, NTRK1 RET.     04/10/2021 Cancer Staging   Staging form: Corpus Uteri - Carcinoma and Carcinosarcoma, AJCC 8th Edition - Clinical stage from 04/10/2021: FIGO Stage III, calculated as Stage Unknown (cT3, cNX, cM0) - Signed by Earlie Server, MD on 08/24/2021 Stage prefix: Initial diagnosis   04/15/2021 Imaging   PET showed 1. Large hypermetabolic endometrial mass consistent with known endometrial cancer. Suspect  direct invasion/involvement of the right adnexa. Right pelvic sidewall hypermetabolic adenopathy.2. 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 3. No findings for metastatic disease  involving the chest or bonystructures.     04/24/2021 - 05/29/2021 Radiation Therapy   status post radiation to pelvis   08/05/2021 Imaging   MRI abdomen w wo contrast 1. Stable solid enhancing 2.7 cm left adrenal lesion, which was hypermetabolic on prior PET/CTs but is unchanged in size dating back to December 12, 2020. 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 However, unfortunately metastatic disease can not be entirely excluded and remains a pertinent differential consideration. Comparison with more remote prior imaging would be the most valuable tool in the assessment of this lesion, as demonstrating long-term stability would indicate this to be a benign lesion. However, if no prior imaging can be made available, would consider follow-up adrenal protocol CT with and without intravenous contrast material in 3 months as this would allow for further assessment of stability as well as enhancement and washout characteristics of the lesion potentially allowing for better characterization versus direct tissue sampling.2. Hepatomegaly and hepatic steatosis.3. Colonic diverticulosis without findings of acute diverticulitis    08/16/2021 - 09/27/2021 Chemotherapy   UTERINE Carboplatin AUC 5 / Paclitaxel q21d x 3      10/03/2021 Imaging   PET 1. No residual hypermetabolism in the uterus suggesting an excellent response to treatment. No findings for metastatic disease. 2. Resolution of hypermetabolism in the left adrenal gland lesion suggesting this was metastatic disease. 3. Diffuse marrow hypermetabolism likely due to rebound from chemotherapy or marrow stimulating drugs   10/18/2021 - 11/08/2021 Chemotherapy   AUC 5 / Paclitaxel/ Keytruda Q21d  x 2 cycles   11/29/2021 -  Chemotherapy   Patient is on Treatment Plan : UTERINE Pembrolizumab  (200) q21d         INTERVAL HISTORY Valerie Case is a 72 y.o. female who has above history reviewed by me today presents for follow up visit for anemia and endometrial cancer Overall she tolerates treatment, No new complaints.  Patient denies nausea vomiting diarrhea.   Review of Systems  Constitutional:  Positive for fatigue. Negative for chills and fever.  HENT:   Negative for hearing loss and voice change.   Eyes:  Negative for eye problems.  Respiratory:  Negative for chest tightness and cough.   Cardiovascular:  Negative for chest pain.  Gastrointestinal:  Negative for abdominal distention, abdominal pain and blood in stool.  Endocrine: Negative for hot flashes.  Genitourinary:  Negative for difficulty urinating, frequency and vaginal bleeding.   Musculoskeletal:  Positive for arthralgias.  Skin:  Negative for itching and rash.  Neurological:  Negative for extremity weakness.  Hematological:  Negative for adenopathy.  Psychiatric/Behavioral:  Negative for confusion.     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    SURGICAL HISTORY: Past Surgical History:  Procedure Laterality Date   CESAREAN SECTION  01/23/1979   IR IMAGING GUIDED PORT INSERTION  08/09/2021   TONSILLECTOMY  03/17/1956    SOCIAL HISTORY: Social History   Socioeconomic History   Marital status: Divorced    Spouse name: Not on file   Number of children: Not on file   Years of education: Not on file   Highest education level: Not on file  Occupational History   Not on file  Tobacco Use   Smoking status: Never   Smokeless tobacco: Never  Vaping Use   Vaping Use: Never used  Substance and Sexual Activity   Alcohol  use: Not Currently   Drug use: Never   Sexual activity: Not Currently  Other Topics Concern   Not on file  Social History Narrative      Social Determinants of Health   Financial Resource Strain: Low Risk  (09/27/2021)   Overall Financial Resource Strain (CARDIA)    Difficulty of Paying Living Expenses: Not very hard  Food Insecurity: No Food Insecurity (09/27/2021)   Hunger Vital Sign    Worried About Running Out of Food in the Last Year: Never true    Ran Out of Food in the Last Year: Never true  Transportation Needs: No Transportation Needs (09/27/2021)   PRAPARE - Hydrologist (Medical): No    Lack of Transportation (Non-Medical): No  Physical Activity: Inactive (09/27/2021)   Exercise Vital Sign    Days of Exercise per Week: 0 days    Minutes of Exercise per Session: 0 min  Stress: Stress Concern Present (09/27/2021)   [address]    Feeling of Stress : Rather much  Social Connections: Moderately Integrated (09/27/2021)   Social Connection and Isolation Panel [NHANES]    Frequency of Communication with Friends and Family: Three times a week    Frequency of Social Gatherings with Friends and Family: Three times a week    Attends Religious Services: 1 to 4 times per year    Active Member of Clubs or Organizations: No    Attends Archivist Meetings: 1 to 4 times per year    Marital Status: Divorced  Intimate Partner Violence: Not At Risk (09/27/2021)   Humiliation, Afraid, Rape, and Kick questionnaire    Fear of Current or Ex-Partner: No    Emotionally Abused: No    Physically Abused: No    Sexually Abused: No    FAMILY HISTORY: Family History  Problem Relation Age of Onset   Breast cancer Mother 54   Cancer Mother    Cancer Father    Lung cancer Father    Breast cancer Cousin        dx 57s maternal    ALLERGIES:  is allergic to doxycycline and  aspirin.  MEDICATIONS:  Current Outpatient Medications  Medication Sig Dispense Refill   acetaminophen (TYLENOL) 650 MG CR tablet Take 1,300 mg by mouth every 8 (eight) hours as needed for pain.     albuterol (VENTOLIN HFA) 108 (90 Base) MCG/ACT inhaler Inhale 2 puffs into the lungs every 6 (six) hours as needed for wheezing or shortness of breath.     Cranberry-Vitamin C-Probiotic (AZO CRANBERRY PO) Take by mouth.     docusate sodium (COLACE) 100 MG capsule Take 1 capsule (100 mg total) by mouth 2 (two) times daily. 60 capsule 1   empagliflozin (JARDIANCE) 25 MG TABS tablet Take 25 mg by mouth daily.     ferrous sulfate 325 (65 FE) MG EC tablet Take 325 mg by mouth  daily with breakfast.     fluconazole (DIFLUCAN) 150 MG tablet Take 1 tablet (150 mg total) by mouth daily. Take 1 tablet (150 mg) by mouth. Three days later, take second tablet. For vulvar yeast infection 2 tablet 0   FLUoxetine (PROZAC) 10 MG capsule Take 10 mg by mouth daily.     fluticasone (FLONASE) 50 MCG/ACT nasal spray Place 1 spray into both nostrils daily as needed for allergies or rhinitis.     hydrochlorothiazide (HYDRODIURIL) 12.5 MG tablet Take 12.5 mg by mouth daily.     hydrocortisone 2.5 % cream Apply topically 2 (two) times daily. 30 g 2   ibuprofen (ADVIL) 200 MG tablet Take 400 mg by mouth every 8 (eight) hours as needed for moderate pain.     lidocaine-prilocaine (EMLA) cream Apply to affected area once 30 g 3   lisinopril (ZESTRIL) 40 MG tablet Take 40 mg by mouth at bedtime.     LORazepam (ATIVAN) 0.5 MG tablet Take 1 tablet (0.5 mg total) by mouth every 8 (eight) hours as needed for anxiety (nausea vomiting). 30 tablet 0   metFORMIN (GLUCOPHAGE-XR) 500 MG 24 hr tablet Take 1,000 mg by mouth at bedtime.     metoprolol succinate (TOPROL-XL) 25 MG 24 hr tablet Take 25 mg by mouth daily.     NOVOLIN 70/30 FLEXPEN (70-30) 100 UNIT/ML KwikPen 50-110 Units See admin instructions. 50 units in the morning, 110 units  at bedtime     OZEMPIC, 1 MG/DOSE, 4 MG/3ML SOPN Inject 1 mg into the skin every Tuesday.     rosuvastatin (CRESTOR) 10 MG tablet Take 10 mg by mouth daily.     traMADol (ULTRAM) 50 MG tablet Take 2 tablets (100 mg total) by mouth every 6 (six) hours as needed. 90 tablet 0   ondansetron (ZOFRAN) 8 MG tablet Take 1 tablet (8 mg total) by mouth 2 (two) times daily as needed. Start on the third day after chemotherapy. (Patient not taking: Reported on 05/16/2022) 30 tablet 1   potassium chloride SA (KLOR-CON M) 20 MEQ tablet Take 1 tablet (20 mEq total) by mouth daily. (Patient not taking: Reported on 01/31/2022) 3 tablet 0   prochlorperazine (COMPAZINE) 10 MG tablet Take 1 tablet (10 mg total) by mouth every 6 (six) hours as needed (Nausea or vomiting). (Patient not taking: Reported on 05/16/2022) 30 tablet 1   No current facility-administered medications for this visit.   Facility-Administered Medications Ordered in Other Visits  Medication Dose Route Frequency Provider Last Rate Last Admin   heparin lock flush 100 UNIT/ML injection            sodium chloride flush (NS) 0.9 % injection 10 mL  10 mL Intracatheter PRN Earlie Server, MD   10 mL at 05/16/22 1109     PHYSICAL EXAMINATION: ECOG PERFORMANCE STATUS: 1 - Symptomatic but completely ambulatory  Physical Exam Constitutional:      General: She is not in acute distress.    Appearance: She is obese.  HENT:     Head: Normocephalic and atraumatic.  Eyes:     General: No scleral icterus. Cardiovascular:     Rate and Rhythm: Normal rate.  Pulmonary:     Effort: Pulmonary effort is normal. No respiratory distress.     Breath sounds: No wheezing.  Abdominal:     General: Bowel sounds are normal. There is no distension.     Palpations: Abdomen is soft.  Musculoskeletal:        General: No  deformity. Normal range of motion.     Cervical back: Normal range of motion.     Right lower leg: Edema present.     Left lower leg: Edema present.      Comments:    Skin:    General: Skin is warm and dry.     Findings: No rash.     Comments: Chronic venous sufficiency skin changes  Neurological:     Mental Status: She is alert and oriented to person, place, and time. Mental status is at baseline.     Cranial Nerves: No cranial nerve deficit.     Coordination: Coordination normal.  Psychiatric:        Mood and Affect: Mood normal.      LABORATORY DATA:  I have reviewed the data as listed    Latest Ref Rng & Units 05/16/2022    8:15 AM 04/25/2022    8:30 AM 04/04/2022    8:27 AM  CBC  WBC 4.0 - 10.5 K/uL 6.2  6.8  6.4   Hemoglobin 12.0 - 15.0 g/dL 10.8  10.5  11.0   Hematocrit 36.0 - 46.0 % 34.5  32.9  34.1   Platelets 150 - 400 K/uL 256  228  262       Latest Ref Rng & Units 05/16/2022    8:15 AM 04/25/2022    8:30 AM 04/04/2022    8:27 AM  CMP  Glucose 70 - 99 mg/dL 193  142  189   BUN 8 - 23 mg/dL 38  35  33   Creatinine 0.44 - 1.00 mg/dL 0.91  1.03  0.99   Sodium 135 - 145 mmol/L 138  137  136   Potassium 3.5 - 5.1 mmol/L 4.3  4.0  4.0   Chloride 98 - 111 mmol/L 106  105  106   CO2 22 - 32 mmol/L '21  21  20   '$ Calcium 8.9 - 10.3 mg/dL 9.8  9.7  9.4   Total Protein 6.5 - 8.1 g/dL 7.2  7.2  7.2   Total Bilirubin 0.3 - 1.2 mg/dL 0.3  0.3  0.3   Alkaline Phos 38 - 126 U/L 61  55  63   AST 15 - 41 U/L '22  24  24   '$ ALT 0 - 44 U/L '13  16  14       '$ RADIOGRAPHIC STUDIES: I have personally reviewed the radiological images as listed and agreed with the findings in the report. CT CHEST ABDOMEN PELVIS W CONTRAST  Result Date: 04/30/2022 CLINICAL DATA:  Adenocarcinoma of the endometrium. Diagnosed in January of 2023. Chemotherapy and radiation therapy finished last fall. Currently on immunotherapy. * Tracking Code: BO * EXAM: CT CHEST, ABDOMEN, AND PELVIS WITH CONTRAST TECHNIQUE: Multidetector CT imaging of the chest, abdomen and pelvis was performed following the standard protocol during bolus administration of intravenous  contrast. RADIATION DOSE REDUCTION: This exam was performed according to the departmental dose-optimization program which includes automated exposure control, adjustment of the mA and/or kV according to patient size and/or use of iterative reconstruction technique. CONTRAST:  172m OMNIPAQUE IOHEXOL 300 MG/ML  SOLN COMPARISON:  01/22/2022 FINDINGS: CT CHEST FINDINGS Cardiovascular: Right Port-A-Cath tip high right atrium. Aortic atherosclerosis. Tortuous thoracic aorta. Mild cardiomegaly, without pericardial effusion. Three vessel coronary artery calcification. No central pulmonary embolism, on this non-dedicated study. Mediastinum/Nodes: No supraclavicular adenopathy. No mediastinal or hilar adenopathy. Lungs/Pleura: No pleural fluid.  Clear lungs. Musculoskeletal: No acute osseous abnormality. CT ABDOMEN PELVIS FINDINGS  Hepatobiliary: Segment 4A 2.9 cm hepatic cyst or minimally complex cyst is similar. Other smaller low-density lesions are similar and likely cysts. Hepatomegaly again identified at approximately 22 cm craniocaudal. Normal gallbladder, without biliary ductal dilatation. Pancreas: Normal, without mass or ductal dilatation. Spleen: Normal in size, without focal abnormality. Adrenals/Urinary Tract: Normal right adrenal gland and kidney. Left adrenal nodule measures 2.7 by 2.2 cm on 52/2 versus 2.8 x 2.4 cm on the prior exam (when remeasured). An interpolar left renal 1.4 cm hypoattenuating lesion can be presumed a cyst or minimally complex cyst and is similar to on the prior exam . In the absence of clinically indicated signs/symptoms require(s) no independent follow-up. No hydronephrosis.  Normal urinary bladder. Stomach/Bowel: Normal stomach, without wall thickening. Extensive colonic diverticulosis. Colonic stool burden suggests constipation. Normal terminal ileum. Normal small bowel. Vascular/Lymphatic: Aortic atherosclerosis. No abdominopelvic adenopathy. Reproductive: Subtle foci of  hypoattenuation within the uterus at 2 mm on 104/2 and 106/2 are similar to on the prior could be treatment related trace gas. No endometrial or myometrial mass. No adnexal mass. Other: No significant free fluid. No evidence of omental or peritoneal disease. Musculoskeletal: Lumbosacral spondylosis with trace L4-5 anterolisthesis. IMPRESSION: 1. Similar to minimal decrease in size of a left adrenal nodule which was felt suspicious for metastatic disease on 10/03/2021 PET. 2. No evidence of new or progressive disease. 3. Coronary artery atherosclerosis. Aortic Atherosclerosis (ICD10-I70.0). 4. Incidental findings, including: Hepatomegaly. Possible constipation. Electronically Signed   By: Abigail Miyamoto M.D.   On: 04/30/2022 09:51

## 2022-05-16 NOTE — Assessment & Plan Note (Signed)
lipid poor hyperfunctioning adenoma vs mets.  FDG avid on PET scan.  Difficult biopsy due to patient's body habitus and deep position of the mass. Resolved on PET scan, suggesting that this lesion might be a metastatic lesion. Continue monitor

## 2022-05-16 NOTE — Patient Instructions (Signed)
[facility] [place] [facility]  Discharge Instructions: Thank you for choosing [facility] to provide your oncology and hematology care.  If you have a lab appointment with the Cancer Center, please go directly to the Cancer Center and check in at the registration area.  Wear comfortable clothing and clothing appropriate for easy access to any Portacath or PICC line.   We strive to give you quality time with your provider. You may need to reschedule your appointment if you arrive late (15 or more minutes).  Arriving late affects you and other patients whose appointments are after yours.  Also, if you miss three or more appointments without notifying the office, you may be dismissed from the clinic at the provider's discretion.      For prescription refill requests, have your pharmacy contact our office and allow 72 hours for refills to be completed.    Today you received the following chemotherapy and/or immunotherapy agents- keytruda      To help prevent nausea and vomiting after your treatment, we encourage you to take your nausea medication as directed.  BELOW ARE SYMPTOMS THAT SHOULD BE REPORTED IMMEDIATELY: *FEVER GREATER THAN 100.4 F (38 C) OR HIGHER *CHILLS OR SWEATING *NAUSEA AND VOMITING THAT IS NOT CONTROLLED WITH YOUR NAUSEA MEDICATION *UNUSUAL SHORTNESS OF BREATH *UNUSUAL BRUISING OR BLEEDING *URINARY PROBLEMS (pain or burning when urinating, or frequent urination) *BOWEL PROBLEMS (unusual diarrhea, constipation, pain near the anus) TENDERNESS IN MOUTH AND THROAT WITH OR WITHOUT PRESENCE OF ULCERS (sore throat, sores in mouth, or a toothache) UNUSUAL RASH, SWELLING OR PAIN  UNUSUAL VAGINAL DISCHARGE OR ITCHING   Items with * indicate a potential emergency and should be followed up as soon as possible or go to the Emergency Department if any problems should occur.  Please show the CHEMOTHERAPY ALERT CARD or IMMUNOTHERAPY ALERT CARD at check-in to  the Emergency Department and triage nurse.  Should you have questions after your visit or need to cancel or reschedule your appointment, please contact [facility] [place] [facility]  [phone] and follow the prompts.  Office hours are 8:00 a.m. to 4:30 p.m. Monday - Friday. Please note that voicemails left after 4:00 p.m. may not be returned until the following business day.  We are closed weekends and major holidays. You have access to a nurse at all times for urgent questions. Please call the main number to the clinic [phone] and follow the prompts.  For any non-urgent questions, you may also contact your provider using MyChart. We now offer e-Visits for anyone 18 and older to request care online for non-urgent symptoms. For details visit [email or web address].   Also download the MyChart app! Go to the app store, search "MyChart", open the app, select [facility], and log in with your MyChart username and password.    

## 2022-05-16 NOTE — Assessment & Plan Note (Signed)
She is currently on immunotherapy.  Hemoglobin improved.

## 2022-05-16 NOTE — Assessment & Plan Note (Signed)
Refer to vascular surgey

## 2022-05-16 NOTE — Progress Notes (Signed)
Pt in for follow up, denies any difficulties or concerns today,

## 2022-05-16 NOTE — Assessment & Plan Note (Signed)
Treatment plan as listed above. 

## 2022-05-16 NOTE — Assessment & Plan Note (Addendum)
s/p carboplatin and Taxol for 5 cycles, on Keytruda maintenance.  CT showed NED Labs are reviewed and discussed with patient. Proceed with [name]

## 2022-05-29 ENCOUNTER — Encounter (INDEPENDENT_AMBULATORY_CARE_PROVIDER_SITE_OTHER): Payer: Self-pay | Admitting: Vascular Surgery

## 2022-05-30 ENCOUNTER — Encounter (INDEPENDENT_AMBULATORY_CARE_PROVIDER_SITE_OTHER): Payer: Self-pay | Admitting: Nurse Practitioner

## 2022-05-30 ENCOUNTER — Ambulatory Visit (INDEPENDENT_AMBULATORY_CARE_PROVIDER_SITE_OTHER): Payer: Medicare PPO | Admitting: Nurse Practitioner

## 2022-05-30 VITALS — BP 121/62 | HR 83 | Resp 16

## 2022-05-30 DIAGNOSIS — I10 Essential (primary) hypertension: Secondary | ICD-10-CM

## 2022-05-30 DIAGNOSIS — I872 Venous insufficiency (chronic) (peripheral): Secondary | ICD-10-CM

## 2022-06-06 ENCOUNTER — Inpatient Hospital Stay: Payer: Medicare PPO

## 2022-06-06 ENCOUNTER — Encounter: Payer: Self-pay | Admitting: Oncology

## 2022-06-06 ENCOUNTER — Other Ambulatory Visit: Payer: Self-pay | Admitting: Oncology

## 2022-06-06 ENCOUNTER — Inpatient Hospital Stay: Payer: Medicare PPO | Admitting: Oncology

## 2022-06-06 VITALS — BP 116/55 | HR 96 | Temp 97.8°F | Resp 18 | Wt 276.0 lb

## 2022-06-06 DIAGNOSIS — C541 Malignant neoplasm of endometrium: Secondary | ICD-10-CM

## 2022-06-06 DIAGNOSIS — D6481 Anemia due to antineoplastic chemotherapy: Secondary | ICD-10-CM | POA: Diagnosis not present

## 2022-06-06 DIAGNOSIS — Z5112 Encounter for antineoplastic immunotherapy: Secondary | ICD-10-CM

## 2022-06-06 DIAGNOSIS — I872 Venous insufficiency (chronic) (peripheral): Secondary | ICD-10-CM

## 2022-06-06 DIAGNOSIS — L989 Disorder of the skin and subcutaneous tissue, unspecified: Secondary | ICD-10-CM

## 2022-06-06 DIAGNOSIS — E278 Other specified disorders of adrenal gland: Secondary | ICD-10-CM | POA: Diagnosis not present

## 2022-06-06 DIAGNOSIS — Z95828 Presence of other vascular implants and grafts: Secondary | ICD-10-CM

## 2022-06-06 DIAGNOSIS — T451X5A Adverse effect of antineoplastic and immunosuppressive drugs, initial encounter: Secondary | ICD-10-CM

## 2022-06-06 DIAGNOSIS — R21 Rash and other nonspecific skin eruption: Secondary | ICD-10-CM | POA: Insufficient documentation

## 2022-06-06 LAB — CBC WITH DIFFERENTIAL/PLATELET
Abs Immature Granulocytes: 0.04 10*3/uL (ref 0.00–0.07)
Basophils Absolute: 0 10*3/uL (ref 0.0–0.1)
Basophils Relative: 1 %
Eosinophils Absolute: 0.3 10*3/uL (ref 0.0–0.5)
Eosinophils Relative: 5 %
HCT: 35.1 % — ABNORMAL LOW (ref 36.0–46.0)
Hemoglobin: 11.4 g/dL — ABNORMAL LOW (ref 12.0–15.0)
Immature Granulocytes: 1 %
Lymphocytes Relative: 14 %
Lymphs Abs: 0.8 10*3/uL (ref 0.7–4.0)
MCH: 30.8 pg (ref 26.0–34.0)
MCHC: 32.5 g/dL (ref 30.0–36.0)
MCV: 94.9 fL (ref 80.0–100.0)
Monocytes Absolute: 0.5 10*3/uL (ref 0.1–1.0)
Monocytes Relative: 9 %
Neutro Abs: 4.3 10*3/uL (ref 1.7–7.7)
Neutrophils Relative %: 70 %
Platelets: 247 10*3/uL (ref 150–400)
RBC: 3.7 MIL/uL — ABNORMAL LOW (ref 3.87–5.11)
RDW: 13.5 % (ref 11.5–15.5)
WBC: 6 10*3/uL (ref 4.0–10.5)
nRBC: 0 % (ref 0.0–0.2)

## 2022-06-06 LAB — COMPREHENSIVE METABOLIC PANEL
ALT: 13 U/L (ref 0–44)
AST: 24 U/L (ref 15–41)
Albumin: 3.9 g/dL (ref 3.5–5.0)
Alkaline Phosphatase: 63 U/L (ref 38–126)
Anion gap: 9 (ref 5–15)
BUN: 32 mg/dL — ABNORMAL HIGH (ref 8–23)
CO2: 20 mmol/L — ABNORMAL LOW (ref 22–32)
Calcium: 9.9 mg/dL (ref 8.9–10.3)
Chloride: 109 mmol/L (ref 98–111)
Creatinine, Ser: 0.99 mg/dL (ref 0.44–1.00)
GFR, Estimated: >60 mL/min (ref >60)
Glucose, Bld: 205 mg/dL — ABNORMAL HIGH (ref 70–99)
Potassium: 4 mmol/L (ref 3.5–5.1)
Sodium: 138 mmol/L (ref 135–145)
Total Bilirubin: 0.3 mg/dL (ref 0.3–1.2)
Total Protein: 7.4 g/dL (ref 6.5–8.1)

## 2022-06-06 MED ORDER — SODIUM CHLORIDE 0.9 % IV SOLN
200.0000 mg | Freq: Once | INTRAVENOUS | Status: AC
  Administered 2022-06-06: 200 mg via INTRAVENOUS
  Filled 2022-06-06: qty 8

## 2022-06-06 MED ORDER — MUPIROCIN 2 % EX OINT: 1.0000 | TOPICAL_OINTMENT | Freq: Three times a day (TID) | CUTANEOUS | 0 refills | Status: DC

## 2022-06-06 MED ORDER — HYDROCORTISONE 2.5 % EX CREA: TOPICAL_CREAM | Freq: Three times a day (TID) | CUTANEOUS | 2 refills | Status: DC

## 2022-06-06 MED ORDER — MUPIROCIN CALCIUM 2 % EX CREA: 1.0000 | TOPICAL_CREAM | Freq: Two times a day (BID) | CUTANEOUS | 0 refills | Status: DC

## 2022-06-06 MED ORDER — SODIUM CHLORIDE 0.9 % IV SOLN
Freq: Once | INTRAVENOUS | Status: AC
  Filled 2022-06-06: qty 250

## 2022-06-06 NOTE — Addendum Note (Signed)
Addended by: Earlie Server on: 06/06/2022 03:40 PM   Modules accepted: Orders

## 2022-06-06 NOTE — Assessment & Plan Note (Signed)
She is currently on immunotherapy.  Hemoglobin improved.  

## 2022-06-06 NOTE — Assessment & Plan Note (Signed)
Refer to vascular surgey 

## 2022-06-06 NOTE — Assessment & Plan Note (Signed)
s/p carboplatin and Taxol for 5 cycles, on Keytruda maintenance.  CT showed NED Labs are reviewed and discussed with patient. Proceed with Keytruda  

## 2022-06-06 NOTE — Assessment & Plan Note (Signed)
Focal erythema of the skin overlying the Mediport. Recommend Bactroban cream topically She will get Keytruda treatment via peripheral vein access today.

## 2022-06-06 NOTE — Progress Notes (Signed)
Hematology/Oncology Progress note [phone] [phone]      CHIEF COMPLAINTS/REASON FOR VISIT:  Follow up for endometrial cancer  ASSESSMENT & PLAN:   Cancer Staging  Endometrial adenocarcinoma [facility]) Staging form: Corpus Uteri - Carcinoma and Carcinosarcoma, AJCC 8th Edition - Clinical stage from 04/10/2021: FIGO Stage III, calculated as Stage Unknown (cT3, cNX, cM0) - Signed by Earlie Server, MD on 08/24/2021   Endometrial adenocarcinoma ([place]) s/p carboplatin and Taxol for 5 cycles, on Keytruda maintenance.  CT showed NED Labs are reviewed and discussed with patient. Proceed with Keytruda   Adrenal mass ([place]) lipid poor hyperfunctioning adenoma vs mets.  FDG avid on PET scan.  Difficult biopsy due to patient's body habitus and deep position of the mass. Resolved on PET scan, suggesting that this lesion might be a metastatic lesion. Continue monitor   Anemia due to antineoplastic chemotherapy She is currently on immunotherapy.  Hemoglobin improved.   Encounter for antineoplastic immunotherapy Treatment plan as listed above.   Venous insufficiency of both lower extremities Refer to vascular surgey  Skin rash Recommend patient to apply topical hydrocortisone cream to skin rash.  Port-A-Cath in place Focal erythema of the skin overlying the Mediport. Recommend Bactroban cream topically She will get Keytruda treatment via peripheral vein access today.   Orders Placed This Encounter  Procedures   CBC with Differential    Standing Status:   Future    Standing Expiration Date:   08/08/2023   Comprehensive metabolic panel    Standing Status:   Future    Standing Expiration Date:   08/08/2023     Return of visit:  3 weeks Lab MD Octaviano Batty, MD, [name] [facility] Hematology Oncology 06/06/2022     HISTORY OF PRESENTING ILLNESS:   Valerie Case is a  72 y.o.  female presents for follow up of  FIGO grade 3 poorly differentiated  endometrial adenocarcinoma. Oncology History  Endometrial adenocarcinoma ([place])  12/12/2020 Imaging   12/12/2020, CT abdomen pelvis with contrast showed no radiographic evidence of urinary tract neoplasm, calculi, hydronephrosis.  2.6 cm nonspecific left adrenal mass. 02/12/2021 CT hematuria work-up showed small uterine fibroids, no findings to explain hematuria.Hepatomegaly, diverticulosis without evidence of diverticulitis, Coronary artery disease   04/01/2021 -  Hospital Admission   04/01/2021 - 04/03/2021, patient was hospitalized due to symptomatic anemia, hemoglobin 6.9, heavy postmenopausal bleeding with passing large clots.  She also presented with increased creatinine level to 2.58 compared to her baseline level of 0.9 in November 2022.  Patient received IV iron infusion and 2 units of PRBC during the hospital stay..  04/02/2021, iron panel showed iron saturation 37, ferritin 37, TIBC 333-the studies were done after patient received blood transfusion.  At discharge, hemoglobin was 7.7.   04/01/2021 Initial Diagnosis   04/01/2020, endometrial biopsy showed poorly differentiated endometrial adenocarcinoma. Omniseq NGS showed TMB 48.4 mt/mb [high], MSI High, PD-L1-TPS <1%, PIK3CA [id number], Negative for BRAF, HER2, NTRK1 RET.     04/10/2021 Cancer Staging   Staging form: Corpus Uteri - Carcinoma and Carcinosarcoma, AJCC 8th Edition - Clinical stage from 04/10/2021: FIGO Stage III, calculated as Stage Unknown (cT3, cNX, cM0) - Signed by Earlie Server, MD on 08/24/2021 Stage prefix: Initial diagnosis   04/15/2021 Imaging   PET showed 1. Large hypermetabolic endometrial mass consistent with known endometrial cancer. Suspect direct invasion/involvement of the right adnexa. Right pelvic sidewall hypermetabolic adenopathy.2. Enlarged and hypermetabolic left adrenal gland lesion could reflect a lipid poor hyperfunctioning adenoma  but metastasis is also possible. 3. No findings for metastatic disease involving the  chest or bonystructures.     04/24/2021 - 05/29/2021 Radiation Therapy   status post radiation to pelvis   08/05/2021 Imaging   MRI abdomen w wo contrast 1. Stable solid enhancing 2.7 cm left adrenal lesion, which was hypermetabolic on prior PET/CTs but is unchanged in size dating back to December 12, 2020. 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 However, unfortunately metastatic disease can not be entirely excluded and remains a pertinent differential consideration. Comparison with more remote prior imaging would be the most valuable tool in the assessment of this lesion, as demonstrating long-term stability would indicate this to be a benign lesion. However, if no prior imaging can be made available, would consider follow-up adrenal protocol CT with and without intravenous contrast material in 3 months as this would allow for further assessment of stability as well as enhancement and washout characteristics of the lesion potentially allowing for better characterization versus direct tissue sampling.2. Hepatomegaly and hepatic steatosis.3. Colonic diverticulosis without findings of acute diverticulitis    08/16/2021 - 09/27/2021 Chemotherapy   UTERINE Carboplatin AUC 5 / Paclitaxel q21d x 3      10/03/2021 Imaging   PET 1. No residual hypermetabolism in the uterus suggesting an excellent response to treatment. No findings for metastatic disease. 2. Resolution of hypermetabolism in the left adrenal gland lesion suggesting this was metastatic disease. 3. Diffuse marrow hypermetabolism likely due to rebound from chemotherapy or marrow stimulating drugs   10/18/2021 - 11/08/2021 Chemotherapy   AUC 5 / Paclitaxel/ Keytruda Q21d  x 2 cycles   11/29/2021 -  Chemotherapy   Patient is on Treatment Plan : UTERINE Pembrolizumab (200) q21d      04/30/2022 Imaging   CT chest abdomen pelvis w contrast  1. Similar to minimal decrease in size of a left adrenal nodule which was felt suspicious for metastatic disease on 10/03/2021 PET. 2. No evidence of new or progressive disease. 3. Coronary artery atherosclerosis. Aortic Atherosclerosis (ICD10-I70.0). 4. Incidental findings, including: Hepatomegaly. Possible constipation.       INTERVAL HISTORY Valerie Case is a 72 y.o. female who has above history reviewed by me today presents for follow up visit for anemia and endometrial cancer Overall she tolerates treatment, No new complaints.  Patient denies nausea vomiting diarrhea. Patient has noticed to spot of skin rash on her right thigh, itchy.  Some erythematous changes for port site.   Review of Systems  Constitutional:  Positive for fatigue. Negative for chills and fever.  HENT:   Negative for hearing loss and voice change.   Eyes:  Negative for eye problems.  Respiratory:  Negative for chest tightness and cough.   Cardiovascular:  Negative for chest pain.  Gastrointestinal:  Negative for abdominal distention, abdominal pain and blood in stool.  Endocrine: Negative for hot flashes.  Genitourinary:  Negative for difficulty urinating, frequency and vaginal bleeding.   Musculoskeletal:  Positive for arthralgias.  Skin:  Negative for itching and rash.  Neurological:  Negative for extremity weakness.  Hematological:  Negative for adenopathy.  Psychiatric/Behavioral:  Negative for confusion.     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    SURGICAL HISTORY: Past Surgical History:  Procedure Laterality Date   CESAREAN SECTION  01/23/1979   IR  IMAGING GUIDED PORT INSERTION  08/09/2021   TONSILLECTOMY  03/17/1956    SOCIAL HISTORY: Social History   Socioeconomic History   Marital status: Divorced    Spouse name: Not on file   Number of children: Not on file   Years of education: Not on file   Highest education level: Not on file  Occupational History   Not on file  Tobacco Use   Smoking status: Never   Smokeless tobacco: Never  Vaping Use   Vaping Use: Never used  Substance and Sexual Activity   Alcohol use: Not Currently   Drug use: Never   Sexual activity: Not Currently  Other Topics Concern   Not on file  Social History Narrative      Social Determinants of Health   Financial Resource Strain: Low Risk  (09/27/2021)   Overall Financial Resource Strain (CARDIA)    Difficulty of Paying Living Expenses: Not very hard  Food Insecurity: No Food Insecurity (09/27/2021)   Hunger Vital Sign    Worried About Running Out of Food in the Last Year: Never true    Ran Out of Food in the Last Year: Never true  Transportation Needs: No Transportation Needs (09/27/2021)   PRAPARE - Hydrologist (Medical): No    Lack of Transportation (Non-Medical): No  Physical Activity: Inactive (09/27/2021)   Exercise Vital Sign    Days of Exercise per Week: 0 days    Minutes of Exercise per Session: 0 min  Stress: Stress Concern Present (09/27/2021)   [place]    Feeling of Stress : Rather much  Social Connections: Moderately Integrated (09/27/2021)   Social Connection and Isolation Panel [NHANES]    Frequency of Communication with Friends and Family: Three times a week    Frequency of Social Gatherings with Friends and Family: Three times a week    Attends Religious Services: 1 to 4 times per year    Active Member of Clubs or Organizations: No    Attends Archivist Meetings: 1 to 4 times per year    Marital Status: Divorced   Intimate Partner Violence: Not At Risk (09/27/2021)   Humiliation, Afraid, Rape, and Kick questionnaire    Fear of Current or Ex-Partner: No    Emotionally Abused: No    Physically Abused: No    Sexually Abused: No    FAMILY HISTORY: Family History  Problem Relation Age of Onset   Breast cancer Mother 17   Cancer Mother    Cancer Father    Lung cancer Father    Breast cancer Cousin        dx 110s maternal    ALLERGIES:  is allergic to doxycycline and aspirin.  MEDICATIONS:  Current Outpatient Medications  Medication Sig Dispense Refill   acetaminophen (TYLENOL) 650 MG CR tablet Take 1,300 mg by mouth every 8 (eight) hours as needed for pain.     albuterol (VENTOLIN HFA) 108 (90 Base) MCG/ACT inhaler Inhale 2 puffs into the lungs every 6 (six) hours as needed for wheezing or shortness of breath.     Cranberry-Vitamin C-Probiotic (AZO CRANBERRY PO) Take by mouth.  docusate sodium (COLACE) 100 MG capsule Take 1 capsule (100 mg total) by mouth 2 (two) times daily. 60 capsule 1   empagliflozin (JARDIANCE) 25 MG TABS tablet Take 25 mg by mouth daily.     ferrous sulfate 325 (65 FE) MG EC tablet Take 325 mg by mouth daily with breakfast.     FLUoxetine (PROZAC) 10 MG capsule Take 10 mg by mouth daily.     fluticasone (FLONASE) 50 MCG/ACT nasal spray Place 1 spray into both nostrils daily as needed for allergies or rhinitis.     hydrochlorothiazide (HYDRODIURIL) 12.5 MG tablet Take 12.5 mg by mouth daily.     ibuprofen (ADVIL) 200 MG tablet Take 400 mg by mouth every 8 (eight) hours as needed for moderate pain.     lidocaine-prilocaine (EMLA) cream Apply to affected area once 30 g 3   lisinopril (ZESTRIL) 40 MG tablet Take 40 mg by mouth at bedtime.     LORazepam (ATIVAN) 0.5 MG tablet Take 1 tablet (0.5 mg total) by mouth every 8 (eight) hours as needed for anxiety (nausea vomiting). 30 tablet 0   metFORMIN (GLUCOPHAGE-XR) 500 MG 24 hr tablet Take 1,000 mg by mouth at bedtime.      metoprolol succinate (TOPROL-XL) 25 MG 24 hr tablet Take 25 mg by mouth daily.     mupirocin cream (BACTROBAN) 2 % Apply 1 Application topically 2 (two) times daily. Apply topically to skin over medi port 22 g 0   NOVOLIN 70/30 FLEXPEN (70-30) 100 UNIT/ML KwikPen 50-110 Units See admin instructions. 50 units in the morning, 110 units at bedtime     OZEMPIC, 1 MG/DOSE, 4 MG/3ML SOPN Inject 1 mg into the skin every Tuesday.     rosuvastatin (CRESTOR) 10 MG tablet Take 10 mg by mouth daily.     traMADol (ULTRAM) 50 MG tablet Take 2 tablets (100 mg total) by mouth every 6 (six) hours as needed. 90 tablet 0   fluconazole (DIFLUCAN) 150 MG tablet Take 1 tablet (150 mg total) by mouth daily. Take 1 tablet (150 mg) by mouth. Three days later, take second tablet. For vulvar yeast infection (Patient not taking: Reported on 05/30/2022) 2 tablet 0   hydrocortisone 2.5 % cream Apply topically 3 (three) times daily. 30 g 2   ondansetron (ZOFRAN) 8 MG tablet Take 1 tablet (8 mg total) by mouth 2 (two) times daily as needed. Start on the third day after chemotherapy. (Patient not taking: Reported on 05/16/2022) 30 tablet 1   potassium chloride SA (KLOR-CON M) 20 MEQ tablet Take 1 tablet (20 mEq total) by mouth daily. (Patient not taking: Reported on 01/31/2022) 3 tablet 0   prochlorperazine (COMPAZINE) 10 MG tablet Take 1 tablet (10 mg total) by mouth every 6 (six) hours as needed (Nausea or vomiting). (Patient not taking: Reported on 05/16/2022) 30 tablet 1   No current facility-administered medications for this visit.   Facility-Administered Medications Ordered in Other Visits  Medication Dose Route Frequency Provider Last Rate Last Admin   heparin lock flush 100 UNIT/ML injection              PHYSICAL EXAMINATION: ECOG PERFORMANCE STATUS: 1 - Symptomatic but completely ambulatory  Physical Exam Constitutional:      General: She is not in acute distress.    Appearance: She is obese.  HENT:     Head:  Normocephalic and atraumatic.  Eyes:     General: No scleral icterus. Cardiovascular:     Rate and Rhythm:  Normal rate.  Pulmonary:     Effort: Pulmonary effort is normal. No respiratory distress.     Breath sounds: No wheezing.  Abdominal:     General: Bowel sounds are normal. There is no distension.     Palpations: Abdomen is soft.  Musculoskeletal:        General: No deformity. Normal range of motion.     Cervical back: Normal range of motion.     Right lower leg: Edema present.     Left lower leg: Edema present.     Comments:    Skin:    General: Skin is warm and dry.     Findings: Rash present.     Comments: Chronic venous sufficiency skin changes  Neurological:     Mental Status: She is alert and oriented to person, place, and time. Mental status is at baseline.     Cranial Nerves: No cranial nerve deficit.     Coordination: Coordination normal.  Psychiatric:        Mood and Affect: Mood normal.      LABORATORY DATA:  I have reviewed the data as listed    Latest Ref Rng & Units 06/06/2022    8:34 AM 05/16/2022    8:15 AM 04/25/2022    8:30 AM  CBC  WBC 4.0 - 10.5 K/uL 6.0  6.2  6.8   Hemoglobin 12.0 - 15.0 g/dL 11.4  10.8  10.5   Hematocrit 36.0 - 46.0 % 35.1  34.5  32.9   Platelets 150 - 400 K/uL 247  256  228       Latest Ref Rng & Units 06/06/2022    8:34 AM 05/16/2022    8:15 AM 04/25/2022    8:30 AM  CMP  Glucose 70 - 99 mg/dL 205  193  142   BUN 8 - 23 mg/dL 32  38  35   Creatinine 0.44 - 1.00 mg/dL 0.99  0.91  1.03   Sodium 135 - 145 mmol/L 138  138  137   Potassium 3.5 - 5.1 mmol/L 4.0  4.3  4.0   Chloride 98 - 111 mmol/L 109  106  105   CO2 22 - 32 mmol/L 20  21  21    Calcium 8.9 - 10.3 mg/dL 9.9  9.8  9.7   Total Protein 6.5 - 8.1 g/dL 7.4  7.2  7.2   Total Bilirubin 0.3 - 1.2 mg/dL 0.3  0.3  0.3   Alkaline Phos 38 - 126 U/L 63  61  55   AST 15 - 41 U/L 24  22  24    ALT 0 - 44 U/L 13  13  16        RADIOGRAPHIC STUDIES: I have personally  reviewed the radiological images as listed and agreed with the findings in the report. CT CHEST ABDOMEN PELVIS W CONTRAST  Result Date: 04/30/2022 CLINICAL DATA:  Adenocarcinoma of the endometrium. Diagnosed in January of 2023. Chemotherapy and radiation therapy finished last fall. Currently on immunotherapy. * Tracking Code: BO * EXAM: CT CHEST, ABDOMEN, AND PELVIS WITH CONTRAST TECHNIQUE: Multidetector CT imaging of the chest, abdomen and pelvis was performed following the standard protocol during bolus administration of intravenous contrast. RADIATION DOSE REDUCTION: This exam was performed according to the departmental dose-optimization program which includes automated exposure control, adjustment of the mA and/or kV according to patient size and/or use of iterative reconstruction technique. CONTRAST:  164mL OMNIPAQUE IOHEXOL 300 MG/ML  SOLN COMPARISON:  01/22/2022 FINDINGS: CT  CHEST FINDINGS Cardiovascular: Right Port-A-Cath tip high right atrium. Aortic atherosclerosis. Tortuous thoracic aorta. Mild cardiomegaly, without pericardial effusion. Three vessel coronary artery calcification. No central pulmonary embolism, on this non-dedicated study. Mediastinum/Nodes: No supraclavicular adenopathy. No mediastinal or hilar adenopathy. Lungs/Pleura: No pleural fluid.  Clear lungs. Musculoskeletal: No acute osseous abnormality. CT ABDOMEN PELVIS FINDINGS Hepatobiliary: Segment 4A 2.9 cm hepatic cyst or minimally complex cyst is similar. Other smaller low-density lesions are similar and likely cysts. Hepatomegaly again identified at approximately 22 cm craniocaudal. Normal gallbladder, without biliary ductal dilatation. Pancreas: Normal, without mass or ductal dilatation. Spleen: Normal in size, without focal abnormality. Adrenals/Urinary Tract: Normal right adrenal gland and kidney. Left adrenal nodule measures 2.7 by 2.2 cm on 52/2 versus 2.8 x 2.4 cm on the prior exam (when remeasured). An interpolar left renal  1.4 cm hypoattenuating lesion can be presumed a cyst or minimally complex cyst and is similar to on the prior exam . In the absence of clinically indicated signs/symptoms require(s) no independent follow-up. No hydronephrosis.  Normal urinary bladder. Stomach/Bowel: Normal stomach, without wall thickening. Extensive colonic diverticulosis. Colonic stool burden suggests constipation. Normal terminal ileum. Normal small bowel. Vascular/Lymphatic: Aortic atherosclerosis. No abdominopelvic adenopathy. Reproductive: Subtle foci of hypoattenuation within the uterus at 2 mm on 104/2 and 106/2 are similar to on the prior could be treatment related trace gas. No endometrial or myometrial mass. No adnexal mass. Other: No significant free fluid. No evidence of omental or peritoneal disease. Musculoskeletal: Lumbosacral spondylosis with trace L4-5 anterolisthesis. IMPRESSION: 1. Similar to minimal decrease in size of a left adrenal nodule which was felt suspicious for metastatic disease on 10/03/2021 PET. 2. No evidence of new or progressive disease. 3. Coronary artery atherosclerosis. Aortic Atherosclerosis (ICD10-I70.0). 4. Incidental findings, including: Hepatomegaly. Possible constipation. Electronically Signed   By: Abigail Miyamoto M.D.   On: 04/30/2022 09:51

## 2022-06-06 NOTE — Assessment & Plan Note (Signed)
Treatment plan as listed above. 

## 2022-06-06 NOTE — Assessment & Plan Note (Signed)
lipid poor hyperfunctioning adenoma vs mets.  FDG avid on PET scan.  Difficult biopsy due to patient's body habitus and deep position of the mass. Resolved on PET scan, suggesting that this lesion might be a metastatic lesion. Continue monitor  

## 2022-06-06 NOTE — Patient Instructions (Signed)
[facility]  Discharge Instructions: Thank you for choosing [facility] to provide your oncology and hematology care.  If you have a lab appointment with the Cancer Center, please go directly to the Cancer Center and check in at the registration area.  Wear comfortable clothing and clothing appropriate for easy access to any Portacath or PICC line.   We strive to give you quality time with your provider. You may need to reschedule your appointment if you arrive late (15 or more minutes).  Arriving late affects you and other patients whose appointments are after yours.  Also, if you miss three or more appointments without notifying the office, you may be dismissed from the clinic at the provider's discretion.      For prescription refill requests, have your pharmacy contact our office and allow 72 hours for refills to be completed.    Today you received the following chemotherapy and/or immunotherapy agents- Keytruda      To help prevent nausea and vomiting after your treatment, we encourage you to take your nausea medication as directed.  BELOW ARE SYMPTOMS THAT SHOULD BE REPORTED IMMEDIATELY: *FEVER GREATER THAN 100.4 F (38 C) OR HIGHER *CHILLS OR SWEATING *NAUSEA AND VOMITING THAT IS NOT CONTROLLED WITH YOUR NAUSEA MEDICATION *UNUSUAL SHORTNESS OF BREATH *UNUSUAL BRUISING OR BLEEDING *URINARY PROBLEMS (pain or burning when urinating, or frequent urination) *BOWEL PROBLEMS (unusual diarrhea, constipation, pain near the anus) TENDERNESS IN MOUTH AND THROAT WITH OR WITHOUT PRESENCE OF ULCERS (sore throat, sores in mouth, or a toothache) UNUSUAL RASH, SWELLING OR PAIN  UNUSUAL VAGINAL DISCHARGE OR ITCHING   Items with * indicate a potential emergency and should be followed up as soon as possible or go to the Emergency Department if any problems should occur.  Please show the CHEMOTHERAPY ALERT CARD or IMMUNOTHERAPY ALERT CARD at check-in to  the Emergency Department and triage nurse.  Should you have questions after your visit or need to cancel or reschedule your appointment, please contact [facility] [place] [facility]  [phone] and follow the prompts.  Office hours are 8:00 a.m. to 4:30 p.m. Monday - Friday. Please note that voicemails left after 4:00 p.m. may not be returned until the following business day.  We are closed weekends and major holidays. You have access to a nurse at all times for urgent questions. Please call the main number to the clinic [phone] and follow the prompts.  For any non-urgent questions, you may also contact your provider using MyChart. We now offer e-Visits for anyone 18 and older to request care online for non-urgent symptoms. For details visit [email or web address].   Also download the MyChart app! Go to the app store, search "MyChart", open the app, select [place], and log in with your MyChart username and password.   

## 2022-06-06 NOTE — Assessment & Plan Note (Signed)
Recommend patient to apply topical hydrocortisone cream to skin rash.

## 2022-06-09 ENCOUNTER — Ambulatory Visit
Admission: RE | Admit: 2022-06-09 | Discharge: 2022-06-09 | Disposition: A | Discharge status: Home / Self Care | Payer: Medicare PPO | Source: Ambulatory Visit | Attending: Radiation Oncology | Admitting: Radiation Oncology

## 2022-06-09 VITALS — BP 153/75 | HR 100 | Temp 98.7°F | Resp 18 | Ht 68.0 in | Wt 276.7 lb

## 2022-06-09 DIAGNOSIS — C541 Malignant neoplasm of endometrium: Secondary | ICD-10-CM | POA: Insufficient documentation

## 2022-06-09 DIAGNOSIS — Z923 Personal history of irradiation: Secondary | ICD-10-CM | POA: Diagnosis not present

## 2022-06-09 NOTE — Progress Notes (Signed)
Radiation Oncology Follow up Note  Name: Valerie Case   Date:   06/09/2022 MRN:  [id number] DOB: [birth year]    This 72 y.o. female presents to the clinic today for 55-month follow-up status post palliative radiation therapy to her pelvis for poorly differentiated endometrial adenocarcinoma.  Stage III (cT3 NX M0)  REFERRING PROVIDER: Tracie Harrier, MD  HPI: Patient is a 72 year old female now out 7 months having been treated with whole pelvic radiation therapy secondary to right sided 1 pelvic lymph node involvement.  She received whole pelvic radiation secondary to bleeding..  Patient also had received carboplatinum and Taxol for 5 cycles and is currently on Keytruda maintenance.  She specifically denies any pelvic pain any increased lower urinary tract symptoms diarrhea.  She had a CT scan in February showing similar minimal decrease in size of left adrenal nodule thought to be suspicious for metastatic disease never biopsied recent scan stable.  No evidence of new or progressive disease. COMPLICATIONS OF TREATMENT: none  FOLLOW UP COMPLIANCE: keeps appointments   PHYSICAL EXAM:  BP (!) 153/75   Pulse 100   Temp 98.7 F (37.1 C)   Resp 18   Ht 5\' 8"  (1.727 m)   Wt 276 lb 11.2 oz (125.5 kg)   BMI 42.07 kg/m  Well-developed well-nourished patient in NAD. HEENT reveals PERLA, EOMI, discs not visualized.  Oral cavity is clear. No oral mucosal lesions are identified. Neck is clear without evidence of cervical or supraclavicular adenopathy. Lungs are clear to A&P. Cardiac examination is essentially unremarkable with regular rate and rhythm without murmur rub or thrill. Abdomen is benign with no organomegaly or masses noted. Motor sensory and DTR levels are equal and symmetric in the upper and lower extremities. Cranial nerves II through XII are grossly intact. Proprioception is intact. No peripheral adenopathy or edema is identified. No motor or sensory levels are noted. Crude visual  fields are within normal range.  RADIOLOGY RESULTS: CT scan reviewed compatible with above-stated findings  PLAN: Present time patient is doing well with minimal evidence of disease.  She continues on Keytruda maintenance.  I am pleased with her overall progress.  At this time of asked to see her back in 6 months should she be stable at that time will turn follow-up care over to GYN oncology and medical oncology.  Patient knows to call with any concerns.  I would like to take this opportunity to thank you for allowing me to participate in the care of your patient.Noreene Filbert, MD

## 2022-06-10 ENCOUNTER — Encounter: Payer: Self-pay | Admitting: Oncology

## 2022-06-17 ENCOUNTER — Other Ambulatory Visit: Payer: Self-pay

## 2022-06-17 ENCOUNTER — Encounter (INDEPENDENT_AMBULATORY_CARE_PROVIDER_SITE_OTHER): Payer: Self-pay | Admitting: Nurse Practitioner

## 2022-06-17 NOTE — Progress Notes (Signed)
Subjective:    Patient ID: Valerie Case, female    DOB: [birth year], 72 y.o.   MRN: [id number] Chief Complaint  Patient presents with   New Patient (Initial Visit)    Ref Tasia Catchings consult vein insufficiency    Valerie Case is a 72 year old female referred by her oncologist Dr. Tasia Catchings in regards to lower extremity swelling with blisters.  The patient had changes concerning for possible venous insufficiency.  The patient was treated with antibiotics by her PCP and the blisters and cellulitis have largely resolved.  The patient has had previous history of endometrial cancer.  She has experienced times of lower extremity edema.  Currently there is evidence of claudication-like symptoms.  No rest pain.    Review of Systems  Cardiovascular:  Positive for leg swelling.  Skin:  Positive for rash.  All other systems reviewed and are negative.      Objective:   Physical Exam Vitals reviewed.  HENT:     Head: Normocephalic.  Cardiovascular:     Rate and Rhythm: Normal rate.  Pulmonary:     Effort: Pulmonary effort is normal.  Musculoskeletal:     Right lower leg: Edema present.  Skin:    General: Skin is warm and dry.  Neurological:     Mental Status: She is alert and oriented to person, place, and time.  Psychiatric:        Mood and Affect: Mood normal.        Behavior: Behavior normal.        Thought Content: Thought content normal.        Judgment: Judgment normal.     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    Social History   Socioeconomic History   Marital status: Divorced    Spouse name: Not on file   Number of children: Not on file   Years of education: Not on file    Highest education level: Not on file  Occupational History   Not on file  Tobacco Use   Smoking status: Never   Smokeless tobacco: Never  Vaping Use   Vaping Use: Never used  Substance and Sexual Activity   Alcohol use: Not Currently   Drug use: Never   Sexual activity: Not Currently  Other Topics Concern   Not on file  Social History Narrative      Social Determinants of Health   Financial Resource Strain: Low Risk  (09/27/2021)   Overall Financial Resource Strain (CARDIA)    Difficulty of Paying Living Expenses: Not very hard  Food Insecurity: No Food Insecurity (09/27/2021)   Hunger Vital Sign    Worried About Running Out of Food in the Last Year: Never true    Ran Out of Food in the Last Year: Never true  Transportation Needs: No Transportation Needs (09/27/2021)   PRAPARE - Hydrologist (Medical): No    Lack of Transportation (Non-Medical): No  Physical Activity: Inactive (09/27/2021)   Exercise Vital Sign    Days of Exercise per Week: 0 days    Minutes of Exercise per Session: 0 min  Stress:  Stress Concern Present (09/27/2021)   [place]    Feeling of Stress : Rather much  Social Connections: Moderately Integrated (09/27/2021)   Social Connection and Isolation Panel [NHANES]    Frequency of Communication with Friends and Family: Three times a week    Frequency of Social Gatherings with Friends and Family: Three times a week    Attends Religious Services: 1 to 4 times per year    Active Member of Clubs or Organizations: No    Attends Archivist Meetings: 1 to 4 times per year    Marital Status: Divorced  Intimate Partner Violence: Not At Risk (09/27/2021)   Humiliation, Afraid, Rape, and Kick questionnaire    Fear of Current or Ex-Partner: No    Emotionally Abused: No    Physically Abused: No    Sexually Abused: No    Past Surgical History:  Procedure  Laterality Date   CESAREAN SECTION  01/23/1979   IR IMAGING GUIDED PORT INSERTION  08/09/2021   TONSILLECTOMY  03/17/1956    Family History  Problem Relation Age of Onset   Breast cancer Mother 19   Cancer Mother    Cancer Father    Lung cancer Father    Breast cancer Cousin        dx 46s maternal    Allergies  Allergen Reactions   Doxycycline     Thrush and mouth sores    Aspirin Rash    Childhood reaction        Latest Ref Rng & Units 06/06/2022    8:34 AM 05/16/2022    8:15 AM 04/25/2022    8:30 AM  CBC  WBC 4.0 - 10.5 K/uL 6.0  6.2  6.8   Hemoglobin 12.0 - 15.0 g/dL 11.4  10.8  10.5   Hematocrit 36.0 - 46.0 % 35.1  34.5  32.9   Platelets 150 - 400 K/uL 247  256  228       CMP     Component Value Date/Time   NA 138 06/06/2022 0834   K 4.0 06/06/2022 0834   CL 109 06/06/2022 0834   CO2 20 (L) 06/06/2022 0834   GLUCOSE 205 (H) 06/06/2022 0834   BUN 32 (H) 06/06/2022 0834   CREATININE 0.99 06/06/2022 0834   CALCIUM 9.9 06/06/2022 0834   PROT 7.4 06/06/2022 0834   ALBUMIN 3.9 06/06/2022 0834   AST 24 06/06/2022 0834   ALT 13 06/06/2022 0834   ALKPHOS 63 06/06/2022 0834   BILITOT 0.3 06/06/2022 0834   GFRNONAA >60 06/06/2022 0834     No results found.     Assessment & Plan:   1. Venous insufficiency of both lower extremities Recommend:  I have had a long discussion with the patient regarding swelling and why it  causes symptoms.  Patient will begin wearing graduated compression on a daily basis a prescription was given. The patient will  wear the stockings first thing in the morning and removing them in the evening. The patient is instructed specifically not to sleep in the stockings.   In addition, behavioral modification will be initiated.  This will include frequent elevation, use of over the counter pain medications and exercise such as walking.  Consideration for a lymph pump will also be made based upon the effectiveness of conservative therapy.   This would help to improve the edema control and prevent sequela such as ulcers and infections   Patient should undergo duplex ultrasound  of the venous system to ensure that DVT or reflux is not present.  The patient will follow-up with me after the ultrasound.   2. Essential hypertension Continue antihypertensive medications as already ordered, these medications have been reviewed and there are no changes at this time.   Current Outpatient Medications on File Prior to Visit  Medication Sig Dispense Refill   acetaminophen (TYLENOL) 650 MG CR tablet Take 1,300 mg by mouth every 8 (eight) hours as needed for pain.     albuterol (VENTOLIN HFA) 108 (90 Base) MCG/ACT inhaler Inhale 2 puffs into the lungs every 6 (six) hours as needed for wheezing or shortness of breath.     Cranberry-Vitamin C-Probiotic (AZO CRANBERRY PO) Take by mouth.     docusate sodium (COLACE) 100 MG capsule Take 1 capsule (100 mg total) by mouth 2 (two) times daily. 60 capsule 1   empagliflozin (JARDIANCE) 25 MG TABS tablet Take 25 mg by mouth daily.     ferrous sulfate 325 (65 FE) MG EC tablet Take 325 mg by mouth daily with breakfast.     FLUoxetine (PROZAC) 10 MG capsule Take 10 mg by mouth daily.     fluticasone (FLONASE) 50 MCG/ACT nasal spray Place 1 spray into both nostrils daily as needed for allergies or rhinitis.     hydrochlorothiazide (HYDRODIURIL) 12.5 MG tablet Take 12.5 mg by mouth daily.     ibuprofen (ADVIL) 200 MG tablet Take 400 mg by mouth every 8 (eight) hours as needed for moderate pain.     lidocaine-prilocaine (EMLA) cream Apply to affected area once 30 g 3   lisinopril (ZESTRIL) 40 MG tablet Take 40 mg by mouth at bedtime.     LORazepam (ATIVAN) 0.5 MG tablet Take 1 tablet (0.5 mg total) by mouth every 8 (eight) hours as needed for anxiety (nausea vomiting). 30 tablet 0   metFORMIN (GLUCOPHAGE-XR) 500 MG 24 hr tablet Take 1,000 mg by mouth at bedtime.     metoprolol succinate (TOPROL-XL) 25 MG 24  hr tablet Take 25 mg by mouth daily.     NOVOLIN 70/30 FLEXPEN (70-30) 100 UNIT/ML KwikPen 50-110 Units See admin instructions. 50 units in the morning, 110 units at bedtime     OZEMPIC, 1 MG/DOSE, 4 MG/3ML SOPN Inject 1 mg into the skin every Tuesday.     rosuvastatin (CRESTOR) 10 MG tablet Take 10 mg by mouth daily.     traMADol (ULTRAM) 50 MG tablet Take 2 tablets (100 mg total) by mouth every 6 (six) hours as needed. 90 tablet 0   fluconazole (DIFLUCAN) 150 MG tablet Take 1 tablet (150 mg total) by mouth daily. Take 1 tablet (150 mg) by mouth. Three days later, take second tablet. For vulvar yeast infection (Patient not taking: Reported on 05/30/2022) 2 tablet 0   ondansetron (ZOFRAN) 8 MG tablet Take 1 tablet (8 mg total) by mouth 2 (two) times daily as needed. Start on the third day after chemotherapy. (Patient not taking: Reported on 05/16/2022) 30 tablet 1   potassium chloride SA (KLOR-CON M) 20 MEQ tablet Take 1 tablet (20 mEq total) by mouth daily. (Patient not taking: Reported on 01/31/2022) 3 tablet 0   prochlorperazine (COMPAZINE) 10 MG tablet Take 1 tablet (10 mg total) by mouth every 6 (six) hours as needed (Nausea or vomiting). (Patient not taking: Reported on 05/16/2022) 30 tablet 1   Current Facility-Administered Medications on File Prior to Visit  Medication Dose Route Frequency Provider Last Rate Last Admin  heparin lock flush 100 UNIT/ML injection             There are no Patient Instructions on file for this visit. No follow-ups on file.   Kris Hartmann, NP

## 2022-06-25 ENCOUNTER — Other Ambulatory Visit (INDEPENDENT_AMBULATORY_CARE_PROVIDER_SITE_OTHER): Payer: Self-pay | Admitting: Nurse Practitioner

## 2022-06-25 DIAGNOSIS — I872 Venous insufficiency (chronic) (peripheral): Secondary | ICD-10-CM

## 2022-06-27 ENCOUNTER — Encounter: Payer: Self-pay | Admitting: Oncology

## 2022-06-27 ENCOUNTER — Inpatient Hospital Stay: Payer: Medicare PPO

## 2022-06-27 ENCOUNTER — Inpatient Hospital Stay (HOSPITAL_BASED_OUTPATIENT_CLINIC_OR_DEPARTMENT_OTHER): Payer: Medicare PPO | Admitting: Oncology

## 2022-06-27 ENCOUNTER — Inpatient Hospital Stay: Payer: Medicare PPO | Attending: Obstetrics and Gynecology

## 2022-06-27 VITALS — BP 124/62 | HR 98 | Temp 98.5°F | Resp 18 | Wt 274.8 lb

## 2022-06-27 DIAGNOSIS — Z79899 Other long term (current) drug therapy: Secondary | ICD-10-CM | POA: Diagnosis not present

## 2022-06-27 DIAGNOSIS — R21 Rash and other nonspecific skin eruption: Secondary | ICD-10-CM

## 2022-06-27 DIAGNOSIS — Z886 Allergy status to analgesic agent status: Secondary | ICD-10-CM | POA: Insufficient documentation

## 2022-06-27 DIAGNOSIS — M47817 Spondylosis without myelopathy or radiculopathy, lumbosacral region: Secondary | ICD-10-CM | POA: Insufficient documentation

## 2022-06-27 DIAGNOSIS — M2550 Pain in unspecified joint: Secondary | ICD-10-CM | POA: Diagnosis not present

## 2022-06-27 DIAGNOSIS — I2510 Atherosclerotic heart disease of native coronary artery without angina pectoris: Secondary | ICD-10-CM | POA: Insufficient documentation

## 2022-06-27 DIAGNOSIS — D6481 Anemia due to antineoplastic chemotherapy: Secondary | ICD-10-CM | POA: Diagnosis not present

## 2022-06-27 DIAGNOSIS — C541 Malignant neoplasm of endometrium: Secondary | ICD-10-CM

## 2022-06-27 DIAGNOSIS — Z801 Family history of malignant neoplasm of trachea, bronchus and lung: Secondary | ICD-10-CM | POA: Diagnosis not present

## 2022-06-27 DIAGNOSIS — Z79620 Long term (current) use of immunosuppressive biologic: Secondary | ICD-10-CM | POA: Insufficient documentation

## 2022-06-27 DIAGNOSIS — Z803 Family history of malignant neoplasm of breast: Secondary | ICD-10-CM | POA: Insufficient documentation

## 2022-06-27 DIAGNOSIS — R5383 Other fatigue: Secondary | ICD-10-CM | POA: Insufficient documentation

## 2022-06-27 DIAGNOSIS — R160 Hepatomegaly, not elsewhere classified: Secondary | ICD-10-CM | POA: Insufficient documentation

## 2022-06-27 DIAGNOSIS — M4316 Spondylolisthesis, lumbar region: Secondary | ICD-10-CM | POA: Insufficient documentation

## 2022-06-27 DIAGNOSIS — I872 Venous insufficiency (chronic) (peripheral): Secondary | ICD-10-CM | POA: Diagnosis not present

## 2022-06-27 DIAGNOSIS — Z5112 Encounter for antineoplastic immunotherapy: Secondary | ICD-10-CM

## 2022-06-27 DIAGNOSIS — Z881 Allergy status to other antibiotic agents status: Secondary | ICD-10-CM | POA: Diagnosis not present

## 2022-06-27 DIAGNOSIS — Z809 Family history of malignant neoplasm, unspecified: Secondary | ICD-10-CM | POA: Diagnosis not present

## 2022-06-27 DIAGNOSIS — Z923 Personal history of irradiation: Secondary | ICD-10-CM | POA: Diagnosis not present

## 2022-06-27 DIAGNOSIS — I10 Essential (primary) hypertension: Secondary | ICD-10-CM | POA: Insufficient documentation

## 2022-06-27 DIAGNOSIS — T451X5A Adverse effect of antineoplastic and immunosuppressive drugs, initial encounter: Secondary | ICD-10-CM | POA: Diagnosis not present

## 2022-06-27 DIAGNOSIS — Z95828 Presence of other vascular implants and grafts: Secondary | ICD-10-CM

## 2022-06-27 DIAGNOSIS — I700 Atherosclerosis of aorta: Secondary | ICD-10-CM | POA: Insufficient documentation

## 2022-06-27 LAB — COMPREHENSIVE METABOLIC PANEL
ALT: 16 U/L (ref 0–44)
AST: 21 U/L (ref 15–41)
Albumin: 4.2 g/dL (ref 3.5–5.0)
Alkaline Phosphatase: 72 U/L (ref 38–126)
Anion gap: 10 (ref 5–15)
BUN: 41 mg/dL — ABNORMAL HIGH (ref 8–23)
CO2: 20 mmol/L — ABNORMAL LOW (ref 22–32)
Calcium: 10.3 mg/dL (ref 8.9–10.3)
Chloride: 106 mmol/L (ref 98–111)
Creatinine, Ser: 1.18 mg/dL — ABNORMAL HIGH (ref 0.44–1.00)
GFR, Estimated: 50 mL/min — ABNORMAL LOW (ref >60)
Glucose, Bld: 169 mg/dL — ABNORMAL HIGH (ref 70–99)
Potassium: 4.5 mmol/L (ref 3.5–5.1)
Sodium: 136 mmol/L (ref 135–145)
Total Bilirubin: 0.4 mg/dL (ref 0.3–1.2)
Total Protein: 7.6 g/dL (ref 6.5–8.1)

## 2022-06-27 LAB — CBC WITH DIFFERENTIAL/PLATELET
Abs Immature Granulocytes: 0.03 10*3/uL (ref 0.00–0.07)
Basophils Absolute: 0 10*3/uL (ref 0.0–0.1)
Basophils Relative: 1 %
Eosinophils Absolute: 0.2 10*3/uL (ref 0.0–0.5)
Eosinophils Relative: 4 %
HCT: 36.4 % (ref 36.0–46.0)
Hemoglobin: 11.9 g/dL — ABNORMAL LOW (ref 12.0–15.0)
Immature Granulocytes: 1 %
Lymphocytes Relative: 16 %
Lymphs Abs: 0.9 10*3/uL (ref 0.7–4.0)
MCH: 30.7 pg (ref 26.0–34.0)
MCHC: 32.7 g/dL (ref 30.0–36.0)
MCV: 94.1 fL (ref 80.0–100.0)
Monocytes Absolute: 0.6 10*3/uL (ref 0.1–1.0)
Monocytes Relative: 10 %
Neutro Abs: 3.8 10*3/uL (ref 1.7–7.7)
Neutrophils Relative %: 68 %
Platelets: 278 10*3/uL (ref 150–400)
RBC: 3.87 MIL/uL (ref 3.87–5.11)
RDW: 13.5 % (ref 11.5–15.5)
WBC: 5.4 10*3/uL (ref 4.0–10.5)
nRBC: 0 % (ref 0.0–0.2)

## 2022-06-27 MED ORDER — SODIUM CHLORIDE 0.9 % IV SOLN
200.0000 mg | Freq: Once | INTRAVENOUS | Status: AC
  Administered 2022-06-27: 200 mg via INTRAVENOUS
  Filled 2022-06-27: qty 8

## 2022-06-27 MED ORDER — AMOXICILLIN-POT CLAVULANATE 875-125 MG PO TABS: 1.0000 | ORAL_TABLET | Freq: Two times a day (BID) | ORAL | 0 refills | Status: DC

## 2022-06-27 MED ORDER — SODIUM CHLORIDE 0.9 % IV SOLN
Freq: Once | INTRAVENOUS | Status: AC
  Filled 2022-06-27: qty 250

## 2022-06-27 NOTE — Assessment & Plan Note (Signed)
Treatment plan as listed above. 

## 2022-06-27 NOTE — Progress Notes (Signed)
Patient tolerated Keytruda infusion well via IV, port has healing rash. No questions/concerns voiced. Patient stable at discharge. AVS given.

## 2022-06-27 NOTE — Assessment & Plan Note (Signed)
?   Cellulitis vs chronic inflammation.  Not improving after topical antibiotics.  Recommend a course of Augmentin BID x 5 days.

## 2022-06-27 NOTE — Progress Notes (Signed)
Hematology/Oncology Progress note [phone] [phone]      CHIEF COMPLAINTS/REASON FOR VISIT:  Follow up for endometrial cancer  ASSESSMENT & PLAN:   Cancer Staging  Endometrial adenocarcinoma Staging form: Corpus Uteri - Carcinoma and Carcinosarcoma, AJCC 8th Edition - Clinical stage from 04/10/2021: FIGO Stage III, calculated as Stage Unknown (cT3, cNX, cM0) - Signed by Rickard Patience, MD on 08/24/2021   Endometrial adenocarcinoma (HCC) s/p carboplatin and Taxol for 5 cycles, on Keytruda maintenance.  CT showed NED Labs are reviewed and discussed with patient. Proceed with Keytruda   Anemia due to antineoplastic chemotherapy She is currently on immunotherapy.  Hemoglobin improved.   Encounter for antineoplastic immunotherapy Treatment plan as listed above.   Skin rash Recommend patient to apply topical hydrocortisone cream to skin rash.  Venous insufficiency of both lower extremities Follow up with  vascular surgey  Port-A-Cath in place ? Cellulitis vs chronic inflammation.  Not improving after topical antibiotics.  Recommend a course of Augmentin BID x 5 days.     Orders Placed This Encounter  Procedures   CBC with Differential    Standing Status:   Future    Standing Expiration Date:   08/29/2023   Comprehensive metabolic panel    Standing Status:   Future    Standing Expiration Date:   08/29/2023     Return of visit:  3 weeks Lab MD Greggory Brandy, MD, [name] [facility] Hematology Oncology 06/27/2022     HISTORY OF PRESENTING ILLNESS:   Valerie Case is a  72 y.o.  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 Oncology History  Endometrial adenocarcinoma  12/12/2020 Imaging   12/12/2020, CT abdomen pelvis with contrast showed no radiographic evidence of urinary tract neoplasm, calculi, hydronephrosis.  2.6 cm nonspecific left adrenal mass. 02/12/2021 CT hematuria  work-up showed small uterine fibroids, no findings to explain hematuria.Hepatomegaly, diverticulosis without evidence of diverticulitis, Coronary artery disease   04/01/2021 -  Hospital Admission   04/01/2021 - 04/03/2021, patient was hospitalized due to symptomatic anemia, hemoglobin 6.9, heavy postmenopausal bleeding with passing large clots.  She also presented with increased creatinine level to 2.58 compared to her baseline level of 0.9 in November 2022.  Patient received IV iron infusion and 2 units of PRBC during the hospital stay..  04/02/2021, iron panel showed iron saturation 37, ferritin 37, TIBC 333-the studies were done after patient received blood transfusion.  At discharge, hemoglobin was 7.7.   04/01/2021 Initial Diagnosis   04/01/2020, endometrial biopsy showed poorly differentiated endometrial adenocarcinoma. Omniseq NGS showed TMB 48.4 mt/mb [high], MSI High, PD-L1-TPS <1%, PIK3CA H1047Q, Negative for BRAF, HER2, NTRK1 RET.     04/10/2021 Cancer Staging   Staging form: Corpus Uteri - Carcinoma and Carcinosarcoma, AJCC 8th Edition - Clinical stage from 04/10/2021: FIGO Stage III, calculated as Stage Unknown (cT3, cNX, cM0) - Signed by Rickard Patience, MD on 08/24/2021 Stage prefix: Initial diagnosis   04/15/2021 Imaging   PET showed 1. Large hypermetabolic endometrial mass consistent with known endometrial cancer. Suspect direct invasion/involvement of the right adnexa. Right pelvic sidewall hypermetabolic adenopathy.2. 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 3. No findings for metastatic disease involving the chest or bonystructures.     04/24/2021 - 05/29/2021 Radiation Therapy   status post radiation to pelvis   08/05/2021 Imaging   MRI abdomen w wo contrast 1. Stable  solid enhancing 2.7 cm left adrenal lesion, which was hypermetabolic on prior PET/CTs but is unchanged in size dating back to  December 12, 2020. 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 However, unfortunately metastatic disease can not be entirely excluded and remains a pertinent differential consideration. Comparison with more remote prior imaging would be the most valuable tool in the assessment of this lesion, as demonstrating long-term stability would indicate this to be a benign lesion. However, if no prior imaging can be made available, would consider follow-up adrenal protocol CT with and without intravenous contrast material in 3 months as this would allow for further assessment of stability as well as enhancement and washout characteristics of the lesion potentially allowing for better characterization versus direct tissue sampling.2. Hepatomegaly and hepatic steatosis.3. Colonic diverticulosis without findings of acute diverticulitis    08/16/2021 - 09/27/2021 Chemotherapy   UTERINE Carboplatin AUC 5 / Paclitaxel q21d x 3      10/03/2021 Imaging   PET 1. No residual hypermetabolism in the uterus suggesting an excellent response to treatment. No findings for metastatic disease. 2. Resolution of hypermetabolism in the left adrenal gland lesion suggesting this was metastatic disease. 3. Diffuse marrow hypermetabolism likely due to rebound from chemotherapy or marrow stimulating drugs   10/18/2021 - 11/08/2021 Chemotherapy   AUC 5 / Paclitaxel/ Keytruda Q21d  x 2 cycles   11/29/2021 -  Chemotherapy   Patient is on Treatment Plan : UTERINE Pembrolizumab (200) q21d     04/30/2022 Imaging   CT chest abdomen pelvis w contrast  1. Similar to minimal decrease in size of a left adrenal nodule which was felt suspicious for metastatic disease on 10/03/2021 PET. 2. No evidence of new or progressive disease. 3. Coronary artery atherosclerosis. Aortic  Atherosclerosis (ICD10-I70.0). 4. Incidental findings, including: Hepatomegaly. Possible constipation.       INTERVAL HISTORY NICHOLETTE DOLSON is a 72 y.o. female who has above history reviewed by me today presents for follow up visit for anemia and endometrial cancer Overall she tolerates treatment, No new complaints.  Patient denies nausea vomiting diarrhea. Skin itchy, she takes benadryl PRN with some relief.  Continues to habe erythematous changes for port site, no discharge, fever chills.    Review of Systems  Constitutional:  Positive for fatigue. Negative for chills and fever.  HENT:   Negative for hearing loss and voice change.   Eyes:  Negative for eye problems.  Respiratory:  Negative for chest tightness and cough.   Cardiovascular:  Negative for chest pain.  Gastrointestinal:  Negative for abdominal distention, abdominal pain and blood in stool.  Endocrine: Negative for hot flashes.  Genitourinary:  Negative for difficulty urinating, frequency and vaginal bleeding.   Musculoskeletal:  Positive for arthralgias.  Skin:  Negative for itching and rash.  Neurological:  Negative for extremity weakness.  Hematological:  Negative for adenopathy.  Psychiatric/Behavioral:  Negative for confusion.     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    SURGICAL HISTORY: Past Surgical History:  Procedure Laterality Date   CESAREAN SECTION  01/23/1979  IR IMAGING GUIDED PORT INSERTION  08/09/2021   TONSILLECTOMY  03/17/1956    SOCIAL HISTORY: Social History   Socioeconomic History   Marital status: Divorced    Spouse name: Not on file   Number of children: Not on file   Years of education: Not on file   Highest  education level: Not on file  Occupational History   Not on file  Tobacco Use   Smoking status: Never   Smokeless tobacco: Never  Vaping Use   Vaping Use: Never used  Substance and Sexual Activity   Alcohol use: Not Currently   Drug use: Never   Sexual activity: Not Currently  Other Topics Concern   Not on file  Social History Narrative      Social Determinants of Health   Financial Resource Strain: Low Risk  (09/27/2021)   Overall Financial Resource Strain (CARDIA)    Difficulty of Paying Living Expenses: Not very hard  Food Insecurity: No Food Insecurity (09/27/2021)   Hunger Vital Sign    Worried About Running Out of Food in the Last Year: Never true    Ran Out of Food in the Last Year: Never true  Transportation Needs: No Transportation Needs (09/27/2021)   PRAPARE - Administrator, Civil Service (Medical): No    Lack of Transportation (Non-Medical): No  Physical Activity: Inactive (09/27/2021)   Exercise Vital Sign    Days of Exercise per Week: 0 days    Minutes of Exercise per Session: 0 min  Stress: Stress Concern Present (09/27/2021)   [employer] of Occupational Health - Occupational Stress Questionnaire    Feeling of Stress : Rather much  Social Connections: Moderately Integrated (09/27/2021)   Social Connection and Isolation Panel [NHANES]    Frequency of Communication with Friends and Family: Three times a week    Frequency of Social Gatherings with Friends and Family: Three times a week    Attends Religious Services: 1 to 4 times per year    Active Member of Clubs or Organizations: No    Attends Banker Meetings: 1 to 4 times per year    Marital Status: Divorced  Intimate Partner Violence: Not At Risk (09/27/2021)   Humiliation, Afraid, Rape, and Kick questionnaire    Fear of Current or Ex-Partner: No    Emotionally Abused: No    Physically Abused: No    Sexually Abused: No    FAMILY HISTORY: Family History  Problem Relation  Age of Onset   Breast cancer Mother 56   Cancer Mother    Cancer Father    Lung cancer Father    Breast cancer Cousin        dx 39s maternal    ALLERGIES:  is allergic to doxycycline and aspirin.  MEDICATIONS:  Current Outpatient Medications  Medication Sig Dispense Refill   acetaminophen (TYLENOL) 650 MG CR tablet Take 1,300 mg by mouth every 8 (eight) hours as needed for pain.     albuterol (VENTOLIN HFA) 108 (90 Base) MCG/ACT inhaler Inhale 2 puffs into the lungs every 6 (six) hours as needed for wheezing or shortness of breath.     Cranberry-Vitamin C-Probiotic (AZO CRANBERRY PO) Take by mouth.     docusate sodium (COLACE) 100 MG capsule Take 1 capsule (100 mg total) by mouth 2 (two) times daily. 60 capsule 1   empagliflozin (JARDIANCE) 25 MG TABS tablet Take 25 mg by mouth daily.     ferrous sulfate 325 (65 FE) MG EC  tablet Take 325 mg by mouth daily with breakfast.     FLUoxetine (PROZAC) 10 MG capsule Take 10 mg by mouth daily.     fluticasone (FLONASE) 50 MCG/ACT nasal spray Place 1 spray into both nostrils daily as needed for allergies or rhinitis.     hydrochlorothiazide (HYDRODIURIL) 12.5 MG tablet Take 12.5 mg by mouth daily.     hydrocortisone 2.5 % cream Apply topically 3 (three) times daily. 30 g 2   ibuprofen (ADVIL) 200 MG tablet Take 400 mg by mouth every 8 (eight) hours as needed for moderate pain.     lidocaine-prilocaine (EMLA) cream Apply to affected area once 30 g 3   lisinopril (ZESTRIL) 40 MG tablet Take 40 mg by mouth at bedtime.     LORazepam (ATIVAN) 0.5 MG tablet Take 1 tablet (0.5 mg total) by mouth every 8 (eight) hours as needed for anxiety (nausea vomiting). 30 tablet 0   metFORMIN (GLUCOPHAGE-XR) 500 MG 24 hr tablet Take 1,000 mg by mouth at bedtime.     metoprolol succinate (TOPROL-XL) 25 MG 24 hr tablet Take 25 mg by mouth daily.     mupirocin ointment (BACTROBAN) 2 % Apply 1 Application topically 3 (three) times daily. 22 g 0   NOVOLIN 70/30 FLEXPEN  (70-30) 100 UNIT/ML KwikPen 50-110 Units See admin instructions. 50 units in the morning, 110 units at bedtime     OZEMPIC, 1 MG/DOSE, 4 MG/3ML SOPN Inject 1 mg into the skin every Tuesday.     prochlorperazine (COMPAZINE) 10 MG tablet Take 1 tablet (10 mg total) by mouth every 6 (six) hours as needed (Nausea or vomiting). 30 tablet 1   rosuvastatin (CRESTOR) 10 MG tablet Take 10 mg by mouth daily.     traMADol (ULTRAM) 50 MG tablet Take 2 tablets (100 mg total) by mouth every 6 (six) hours as needed. 90 tablet 0   ondansetron (ZOFRAN) 8 MG tablet Take 1 tablet (8 mg total) by mouth 2 (two) times daily as needed. Start on the third day after chemotherapy. (Patient not taking: Reported on 05/16/2022) 30 tablet 1   potassium chloride SA (KLOR-CON M) 20 MEQ tablet Take 1 tablet (20 mEq total) by mouth daily. (Patient not taking: Reported on 01/31/2022) 3 tablet 0   No current facility-administered medications for this visit.   Facility-Administered Medications Ordered in Other Visits  Medication Dose Route Frequency Provider Last Rate Last Admin   heparin lock flush 100 UNIT/ML injection              PHYSICAL EXAMINATION: ECOG PERFORMANCE STATUS: 1 - Symptomatic but completely ambulatory  Physical Exam Constitutional:      General: She is not in acute distress.    Appearance: She is obese.  HENT:     Head: Normocephalic and atraumatic.  Eyes:     General: No scleral icterus. Cardiovascular:     Rate and Rhythm: Normal rate.  Pulmonary:     Effort: Pulmonary effort is normal. No respiratory distress.     Breath sounds: No wheezing.  Abdominal:     General: Bowel sounds are normal. There is no distension.     Palpations: Abdomen is soft.  Musculoskeletal:        General: No deformity. Normal range of motion.     Cervical back: Normal range of motion.     Right lower leg: Edema present.     Left lower leg: Edema present.     Comments:    Skin:  General: Skin is warm and dry.      Findings: Rash present.     Comments: Chronic venous sufficiency skin changes  Neurological:     Mental Status: She is alert and oriented to person, place, and time. Mental status is at baseline.     Cranial Nerves: No cranial nerve deficit.     Coordination: Coordination normal.  Psychiatric:        Mood and Affect: Mood normal.      06/27/22    LABORATORY DATA:  I have reviewed the data as listed    Latest Ref Rng & Units 06/27/2022   11:46 AM 06/06/2022    8:34 AM 05/16/2022    8:15 AM  CBC  WBC 4.0 - 10.5 K/uL 5.4  6.0  6.2   Hemoglobin 12.0 - 15.0 g/dL 47.8  29.5  62.1   Hematocrit 36.0 - 46.0 % 36.4  35.1  34.5   Platelets 150 - 400 K/uL 278  247  256       Latest Ref Rng & Units 06/06/2022    8:34 AM 05/16/2022    8:15 AM 04/25/2022    8:30 AM  CMP  Glucose 70 - 99 mg/dL 308  657  846   BUN 8 - 23 mg/dL 32  38  35   Creatinine 0.44 - 1.00 mg/dL 9.62  9.52  8.41   Sodium 135 - 145 mmol/L 138  138  137   Potassium 3.5 - 5.1 mmol/L 4.0  4.3  4.0   Chloride 98 - 111 mmol/L 109  106  105   CO2 22 - 32 mmol/L Calcium 8.9 - 10.3 mg/dL 9.9  9.8  9.7   Total Protein 6.5 - 8.1 g/dL 7.4  7.2  7.2   Total Bilirubin 0.3 - 1.2 mg/dL 0.3  0.3  0.3   Alkaline Phos 38 - 126 U/L 63  61  55   AST 15 - 41 U/L ALT 0 - 44 U/L RADIOGRAPHIC STUDIES: I have personally reviewed the radiological images as listed and agreed with the findings in the report. CT CHEST ABDOMEN PELVIS W CONTRAST  Result Date: 04/30/2022 CLINICAL DATA:  Adenocarcinoma of the endometrium. Diagnosed in January of 2023. Chemotherapy and radiation therapy finished last fall. Currently on immunotherapy. * Tracking Code: BO * EXAM: CT CHEST, ABDOMEN, AND PELVIS WITH CONTRAST TECHNIQUE: Multidetector CT imaging of the chest, abdomen and pelvis was performed following the standard protocol during bolus administration of intravenous contrast. RADIATION DOSE REDUCTION: This exam was  performed according to the departmental dose-optimization program which includes automated exposure control, adjustment of the mA and/or kV according to patient size and/or use of iterative reconstruction technique. CONTRAST:  OMNIPAQUE IOHEXOL 300 MG/ML  SOLN COMPARISON:  01/22/2022 FINDINGS: CT CHEST FINDINGS Cardiovascular: Right Port-A-Cath tip high right atrium. Aortic atherosclerosis. Tortuous thoracic aorta. Mild cardiomegaly, without pericardial effusion. Three vessel coronary artery calcification. No central pulmonary embolism, on this non-dedicated study. Mediastinum/Nodes: No supraclavicular adenopathy. No mediastinal or hilar adenopathy. Lungs/Pleura: No pleural fluid.  Clear lungs. Musculoskeletal: No acute osseous abnormality. CT ABDOMEN PELVIS FINDINGS Hepatobiliary: Segment 4A 2.9 cm hepatic cyst or minimally complex cyst is similar. Other smaller low-density lesions are similar and likely cysts. Hepatomegaly again identified at approximately 22 cm craniocaudal. Normal gallbladder, without biliary ductal dilatation. Pancreas: Normal, without mass or ductal  dilatation. Spleen: Normal in size, without focal abnormality. Adrenals/Urinary Tract: Normal right adrenal gland and kidney. Left adrenal nodule measures 2.7 by 2.2 cm on 52/2 versus 2.8 x 2.4 cm on the prior exam (when remeasured). An interpolar left renal 1.4 cm hypoattenuating lesion can be presumed a cyst or minimally complex cyst and is similar to on the prior exam . In the absence of clinically indicated signs/symptoms require(s) no independent follow-up. No hydronephrosis.  Normal urinary bladder. Stomach/Bowel: Normal stomach, without wall thickening. Extensive colonic diverticulosis. Colonic stool burden suggests constipation. Normal terminal ileum. Normal small bowel. Vascular/Lymphatic: Aortic atherosclerosis. No abdominopelvic adenopathy. Reproductive: Subtle foci of hypoattenuation within the uterus at 2 mm on 104/2 and 106/2  are similar to on the prior could be treatment related trace gas. No endometrial or myometrial mass. No adnexal mass. Other: No significant free fluid. No evidence of omental or peritoneal disease. Musculoskeletal: Lumbosacral spondylosis with trace L4-5 anterolisthesis. IMPRESSION: 1. Similar to minimal decrease in size of a left adrenal nodule which was felt suspicious for metastatic disease on 10/03/2021 PET. 2. No evidence of new or progressive disease. 3. Coronary artery atherosclerosis. Aortic Atherosclerosis (ICD10-I70.0). 4. Incidental findings, including: Hepatomegaly. Possible constipation. Electronically Signed   By: Jeronimo Greaves M.D.   On: 04/30/2022 09:51

## 2022-06-27 NOTE — Assessment & Plan Note (Signed)
Follow up with  vascular surgey

## 2022-06-27 NOTE — Patient Instructions (Signed)
[facility]  Discharge Instructions: Thank you for choosing [facility] to provide your oncology and hematology care.  If you have a lab appointment with the Cancer Center, please go directly to the Cancer Center and check in at the registration area.  Wear comfortable clothing and clothing appropriate for easy access to any Portacath or PICC line.   We strive to give you quality time with your provider. You may need to reschedule your appointment if you arrive late (15 or more minutes).  Arriving late affects you and other patients whose appointments are after yours.  Also, if you miss three or more appointments without notifying the office, you may be dismissed from the clinic at the provider's discretion.      For prescription refill requests, have your pharmacy contact our office and allow 72 hours for refills to be completed.    Today you received the following chemotherapy and/or immunotherapy agents- keytruda      To help prevent nausea and vomiting after your treatment, we encourage you to take your nausea medication as directed.  BELOW ARE SYMPTOMS THAT SHOULD BE REPORTED IMMEDIATELY: *FEVER GREATER THAN 100.4 F (38 C) OR HIGHER *CHILLS OR SWEATING *NAUSEA AND VOMITING THAT IS NOT CONTROLLED WITH YOUR NAUSEA MEDICATION *UNUSUAL SHORTNESS OF BREATH *UNUSUAL BRUISING OR BLEEDING *URINARY PROBLEMS (pain or burning when urinating, or frequent urination) *BOWEL PROBLEMS (unusual diarrhea, constipation, pain near the anus) TENDERNESS IN MOUTH AND THROAT WITH OR WITHOUT PRESENCE OF ULCERS (sore throat, sores in mouth, or a toothache) UNUSUAL RASH, SWELLING OR PAIN  UNUSUAL VAGINAL DISCHARGE OR ITCHING   Items with * indicate a potential emergency and should be followed up as soon as possible or go to the Emergency Department if any problems should occur.  Please show the CHEMOTHERAPY ALERT CARD or IMMUNOTHERAPY ALERT CARD at check-in to  the Emergency Department and triage nurse.  Should you have questions after your visit or need to cancel or reschedule your appointment, please contact [facility] [facility]  [phone] and follow the prompts.  Office hours are 8:00 a.m. to 4:30 p.m. Monday - Friday. Please note that voicemails left after 4:00 p.m. may not be returned until the following business day.  We are closed weekends and major holidays. You have access to a nurse at all times for urgent questions. Please call the main number to the clinic [phone] and follow the prompts.  For any non-urgent questions, you may also contact your provider using MyChart. We now offer e-Visits for anyone 18 and older to request care online for non-urgent symptoms. For details visit [email or web address].   Also download the MyChart app! Go to the app store, search "MyChart", open the app, select , and log in with your MyChart username and password.    

## 2022-06-27 NOTE — Assessment & Plan Note (Signed)
Recommend patient to apply topical hydrocortisone cream to skin rash. 

## 2022-06-27 NOTE — Progress Notes (Signed)
Pt here for follow up. Pt reports that she now has freestyle glucose monitor on right arm. Port a cath area appears red, it was not accessed today. Pt reports that it looks better than last time.

## 2022-06-27 NOTE — Assessment & Plan Note (Signed)
s/p carboplatin and Taxol for 5 cycles, on Keytruda maintenance.  CT showed NED Labs are reviewed and discussed with patient. Proceed with Keytruda  

## 2022-06-27 NOTE — Assessment & Plan Note (Signed)
She is currently on immunotherapy.  Hemoglobin improved.  

## 2022-07-02 ENCOUNTER — Inpatient Hospital Stay (HOSPITAL_BASED_OUTPATIENT_CLINIC_OR_DEPARTMENT_OTHER): Payer: Medicare PPO | Admitting: Medical Oncology

## 2022-07-02 ENCOUNTER — Other Ambulatory Visit: Payer: Self-pay | Admitting: Oncology

## 2022-07-02 ENCOUNTER — Encounter: Payer: Self-pay | Admitting: Oncology

## 2022-07-02 ENCOUNTER — Encounter: Payer: Self-pay | Admitting: Medical Oncology

## 2022-07-02 ENCOUNTER — Other Ambulatory Visit: Payer: Self-pay | Admitting: *Deleted

## 2022-07-02 ENCOUNTER — Inpatient Hospital Stay: Payer: Medicare PPO

## 2022-07-02 VITALS — BP 107/44 | HR 90 | Temp 99.0°F | Resp 18 | Ht 68.0 in | Wt 278.0 lb

## 2022-07-02 DIAGNOSIS — Z79899 Other long term (current) drug therapy: Secondary | ICD-10-CM | POA: Diagnosis not present

## 2022-07-02 DIAGNOSIS — Z5112 Encounter for antineoplastic immunotherapy: Secondary | ICD-10-CM | POA: Diagnosis not present

## 2022-07-02 DIAGNOSIS — T80212A Local infection due to central venous catheter, initial encounter: Secondary | ICD-10-CM

## 2022-07-02 DIAGNOSIS — R21 Rash and other nonspecific skin eruption: Secondary | ICD-10-CM | POA: Diagnosis not present

## 2022-07-02 DIAGNOSIS — C541 Malignant neoplasm of endometrium: Secondary | ICD-10-CM

## 2022-07-02 LAB — CBC WITH DIFFERENTIAL (CANCER CENTER ONLY)
Abs Immature Granulocytes: 0.02 10*3/uL (ref 0.00–0.07)
Basophils Absolute: 0 10*3/uL (ref 0.0–0.1)
Basophils Relative: 0 %
Eosinophils Absolute: 0.3 10*3/uL (ref 0.0–0.5)
Eosinophils Relative: 6 %
HCT: 36.7 % (ref 36.0–46.0)
Hemoglobin: 11.8 g/dL — ABNORMAL LOW (ref 12.0–15.0)
Immature Granulocytes: 0 %
Lymphocytes Relative: 19 %
Lymphs Abs: 0.9 10*3/uL (ref 0.7–4.0)
MCH: 30.5 pg (ref 26.0–34.0)
MCHC: 32.2 g/dL (ref 30.0–36.0)
MCV: 94.8 fL (ref 80.0–100.0)
Monocytes Absolute: 0.5 10*3/uL (ref 0.1–1.0)
Monocytes Relative: 11 %
Neutro Abs: 3.1 10*3/uL (ref 1.7–7.7)
Neutrophils Relative %: 64 %
Platelet Count: 258 10*3/uL (ref 150–400)
RBC: 3.87 MIL/uL (ref 3.87–5.11)
RDW: 13.8 % (ref 11.5–15.5)
WBC Count: 4.8 10*3/uL (ref 4.0–10.5)
nRBC: 0 % (ref 0.0–0.2)

## 2022-07-02 LAB — CMP (CANCER CENTER ONLY)
ALT: 17 U/L (ref 0–44)
AST: 24 U/L (ref 15–41)
Albumin: 3.9 g/dL (ref 3.5–5.0)
Alkaline Phosphatase: 75 U/L (ref 38–126)
Anion gap: 9 (ref 5–15)
BUN: 34 mg/dL — ABNORMAL HIGH (ref 8–23)
CO2: 20 mmol/L — ABNORMAL LOW (ref 22–32)
Calcium: 9.6 mg/dL (ref 8.9–10.3)
Chloride: 109 mmol/L (ref 98–111)
Creatinine: 1.03 mg/dL — ABNORMAL HIGH (ref 0.44–1.00)
GFR, Estimated: 58 mL/min — ABNORMAL LOW (ref >60)
Glucose, Bld: 155 mg/dL — ABNORMAL HIGH (ref 70–99)
Potassium: 4.4 mmol/L (ref 3.5–5.1)
Sodium: 138 mmol/L (ref 135–145)
Total Bilirubin: 0.3 mg/dL (ref 0.3–1.2)
Total Protein: 7.3 g/dL (ref 6.5–8.1)

## 2022-07-02 MED ORDER — TRIAMCINOLONE ACETONIDE 0.5 % EX OINT: 1.0000 | TOPICAL_OINTMENT | Freq: Two times a day (BID) | CUTANEOUS | 0 refills | Status: DC

## 2022-07-02 MED ORDER — SULFAMETHOXAZOLE-TRIMETHOPRIM 800-160 MG PO TABS: 1.0000 | ORAL_TABLET | Freq: Two times a day (BID) | ORAL | 0 refills | Status: AC

## 2022-07-02 NOTE — Progress Notes (Unsigned)
Port redness, itching and irritated.

## 2022-07-02 NOTE — Telephone Encounter (Signed)
Dr. Cathie Hoops will not send in antibiotic w/o further port site evaluation. Can [facility] appt be set up for further evaluation?

## 2022-07-02 NOTE — Progress Notes (Unsigned)
Symptom Management Clinic [facility] [phone] Fax:([phone]  Patient Care Team: Barbette Reichmann, MD as PCP - General (Internal Medicine)   Name of the patient: Valerie Case  [id number]  Feb 02, 1951   Oncological History: Endometrial Carcinoma stage III  Current Treatment: S/p carboplatin and taxol for 5 cycles now on keytruda maintenance   Date of visit: 07/02/22  Reason for Consult: Valerie Case is a 72 y.o. female who presents today for:  Port redness: Started after last infusion of Keytruda. Started as small amount of redness and itching. Was started on topical ABX then amoxil as redness did not improve. She remains without fever, chills or pain at the site. No discharge either. She asked if she should have a refill on the amoxil and instead it was suggested that she be seen by our [facility] clinic for evaluation.   Denies any neurologic complaints. Denies recent fevers or illnesses. Denies any easy bleeding or bruising. Reports good appetite and denies weight loss. Denies chest pain. Denies any nausea, vomiting, constipation, or diarrhea. Denies urinary complaints. Patient offers no further specific complaints today.    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  2.6 cm nonspecific left adrenal mass. 02/12/2021 CT hematuria work-up showed small uterine fibroids, no findings to explain hematuria.Hepatomegaly, diverticulosis without evidence of diverticulitis, Coronary artery disease   04/01/2021 -  Hospital Admission   04/01/2021 - 04/03/2021, patient was hospitalized due to symptomatic anemia, hemoglobin 6.9, heavy postmenopausal bleeding with passing large clots.  She also presented with increased creatinine level to 2.58 compared to her baseline level of 0.9 in November 2022.  Patient received IV iron infusion and 2 units of PRBC during the hospital stay..  04/02/2021, iron panel showed iron saturation 37, ferritin 37, TIBC 333-the studies were done after patient received blood transfusion.  At discharge, hemoglobin was 7.7.   04/01/2021 Initial Diagnosis   04/01/2020, endometrial biopsy showed poorly differentiated endometrial adenocarcinoma. Omniseq NGS showed TMB 48.4 mt/mb [high], MSI High, PD-L1-TPS <1%, PIK3CA H1047Q, Negative for BRAF, HER2, NTRK1 RET.     04/10/2021 Cancer Staging   Staging form: Corpus Uteri - Carcinoma and Carcinosarcoma, AJCC 8th Edition - Clinical stage from 04/10/2021: FIGO Stage III, calculated as Stage Unknown (cT3, cNX, cM0) - Signed by Rickard Patience, MD on 08/24/2021 Stage prefix: Initial diagnosis   04/15/2021 Imaging   PET showed 1. Large hypermetabolic endometrial mass consistent with known endometrial cancer. Suspect direct invasion/involvement of the right adnexa. Right pelvic sidewall hypermetabolic adenopathy.2. 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 3. No findings for metastatic disease involving the chest or bonystructures.     04/24/2021 - 05/29/2021 Radiation Therapy   status post radiation to pelvis   08/05/2021 Imaging  MRI abdomen w wo contrast 1. Stable solid enhancing 2.7 cm left adrenal lesion, which  was hypermetabolic on prior PET/CTs but is unchanged in size dating back to December 12, 2020. 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 However, unfortunately metastatic disease can not be entirely excluded and remains a pertinent differential consideration. Comparison with more remote prior imaging would be the most valuable tool in the assessment of this lesion, as demonstrating long-term stability would indicate this to be a benign lesion. However, if no prior imaging can be made available, would consider follow-up adrenal protocol CT with and without intravenous contrast material in 3 months as this would allow for further assessment of stability as well as enhancement and washout characteristics of the lesion potentially allowing for better characterization versus direct tissue sampling.2. Hepatomegaly and hepatic steatosis.3. Colonic diverticulosis without findings of acute diverticulitis    08/16/2021 - 09/27/2021 Chemotherapy   UTERINE Carboplatin AUC 5 / Paclitaxel q21d x 3      10/03/2021 Imaging   PET 1. No residual hypermetabolism in the uterus suggesting an excellent response to treatment. No findings for metastatic disease. 2. Resolution of hypermetabolism in the left adrenal gland lesion suggesting this was metastatic disease. 3. Diffuse marrow hypermetabolism likely due to rebound from chemotherapy or marrow stimulating drugs   10/18/2021 - 11/08/2021 Chemotherapy   AUC 5 / Paclitaxel/ Keytruda Q21d  x 2 cycles   11/29/2021 -  Chemotherapy   Patient is on Treatment Plan : UTERINE Pembrolizumab (200) q21d     04/30/2022 Imaging   CT chest abdomen pelvis w contrast  1. Similar to minimal decrease in size of a left adrenal nodule which was felt suspicious for metastatic disease on 10/03/2021 PET. 2. No evidence of new  or progressive disease. 3. Coronary artery atherosclerosis. Aortic Atherosclerosis (ICD10-I70.0). 4. Incidental findings, including: Hepatomegaly. Possible constipation.     ALLERGIES:  is allergic to doxycycline and aspirin.  MEDICATIONS:  Current Outpatient Medications  Medication Sig Dispense Refill   acetaminophen (TYLENOL) 650 MG CR tablet Take 1,300 mg by mouth every 8 (eight) hours as needed for pain.     albuterol (VENTOLIN HFA) 108 (90 Base) MCG/ACT inhaler Inhale 2 puffs into the lungs every 6 (six) hours as needed for wheezing or shortness of breath.     amoxicillin-clavulanate (AUGMENTIN) 875-125 MG tablet Take 1 tablet by mouth 2 (two) times daily. 10 tablet 0   Cranberry-Vitamin C-Probiotic (AZO CRANBERRY PO) Take by mouth.     docusate sodium (COLACE) 100 MG capsule Take 1 capsule (100 mg total) by mouth 2 (two) times daily. 60 capsule 1   empagliflozin (JARDIANCE) 25 MG TABS tablet Take 25 mg by mouth daily.     ferrous sulfate 325 (65 FE) MG EC tablet Take 325 mg by mouth daily with breakfast.     FLUoxetine (PROZAC) 10 MG capsule Take 10 mg by mouth daily.     fluticasone (FLONASE) 50 MCG/ACT nasal spray Place 1 spray into both nostrils daily as needed for allergies or rhinitis.     hydrochlorothiazide (HYDRODIURIL) 12.5 MG tablet Take 12.5 mg by mouth daily.     hydrocortisone 2.5 % cream Apply topically 3 (three) times daily. 30 g 2   ibuprofen (ADVIL) 200 MG tablet Take 400 mg by mouth every 8 (eight) hours as needed for moderate pain.  lidocaine-prilocaine (EMLA) cream Apply to affected area once 30 g 3   lisinopril (ZESTRIL) 40 MG tablet Take 40 mg by mouth at bedtime.     LORazepam (ATIVAN) 0.5 MG tablet Take 1 tablet (0.5 mg total) by mouth every 8 (eight) hours as needed for anxiety (nausea vomiting). 30 tablet 0   metFORMIN (GLUCOPHAGE-XR) 500 MG 24 hr tablet Take 1,000 mg by mouth at bedtime.     metoprolol succinate (TOPROL-XL) 25 MG 24 hr tablet Take 25 mg  by mouth daily.     mupirocin ointment (BACTROBAN) 2 % Apply 1 Application topically 3 (three) times daily. 22 g 0   NOVOLIN 70/30 FLEXPEN (70-30) 100 UNIT/ML KwikPen 50-110 Units See admin instructions. 50 units in the morning, 110 units at bedtime     OZEMPIC, 1 MG/DOSE, 4 MG/3ML SOPN Inject 1 mg into the skin every Tuesday.     prochlorperazine (COMPAZINE) 10 MG tablet Take 1 tablet (10 mg total) by mouth every 6 (six) hours as needed (Nausea or vomiting). 30 tablet 1   rosuvastatin (CRESTOR) 10 MG tablet Take 10 mg by mouth daily.     sulfamethoxazole-trimethoprim (BACTRIM DS) 800-160 MG tablet Take 1 tablet by mouth 2 (two) times daily for 7 days. 14 tablet 0   traMADol (ULTRAM) 50 MG tablet Take 2 tablets (100 mg total) by mouth every 6 (six) hours as needed. 90 tablet 0   triamcinolone ointment (KENALOG) 0.5 % Apply 1 Application topically 2 (two) times daily. 30 g 0   ondansetron (ZOFRAN) 8 MG tablet Take 1 tablet (8 mg total) by mouth 2 (two) times daily as needed. Start on the third day after chemotherapy. (Patient not taking: Reported on 05/16/2022) 30 tablet 1   potassium chloride SA (KLOR-CON M) 20 MEQ tablet Take 1 tablet (20 mEq total) by mouth daily. (Patient not taking: Reported on 01/31/2022) 3 tablet 0   No current facility-administered medications for this visit.   Facility-Administered Medications Ordered in Other Visits  Medication Dose Route Frequency Provider Last Rate Last Admin   heparin lock flush 100 UNIT/ML injection             VITAL SIGNS: BP (!) 107/44 (BP Location: Left Arm, Patient Position: Sitting, Cuff Size: Large)   Pulse 90   Temp 99 F (37.2 C) (Tympanic)   Resp 18   Ht  (1.727 m)   Wt 278 lb (126.1 kg)   SpO2 98%   BMI 42.27 kg/m  Filed Weights   07/02/22 1326  Weight: 278 lb (126.1 kg)    Estimated body mass index is 42.27 kg/m as calculated from the following:   Height as of this encounter:  (1.727 m).   Weight as of this  encounter: 278 lb (126.1 kg).  LABS: CBC:    Component Value Date/Time   WBC 4.8 07/02/2022 1259   WBC 5.4 06/27/2022 1146   HGB 11.8 (L) 07/02/2022 1259   HGB 12.7 06/08/2012 1128   HCT 36.7 07/02/2022 1259   HCT 37.7 06/08/2012 1128   PLT 258 07/02/2022 1259   PLT 310 06/08/2012 1128   MCV 94.8 07/02/2022 1259   MCV 88 06/08/2012 1128   NEUTROABS 3.1 07/02/2022 1259   NEUTROABS 5.9 06/08/2012 1128   LYMPHSABS 0.9 07/02/2022 1259   LYMPHSABS 3.2 06/08/2012 1128   MONOABS 0.5 07/02/2022 1259   MONOABS 0.7 06/08/2012 1128   EOSABS 0.3 07/02/2022 1259   EOSABS 0.4 06/08/2012 1128   BASOSABS 0.0 07/02/2022  1259   BASOSABS 0.1 06/08/2012 1128   Comprehensive Metabolic Panel:    Component Value Date/Time   NA 138 07/02/2022 1259   K 4.4 07/02/2022 1259   CL 109 07/02/2022 1259   CO2 20 (L) 07/02/2022 1259   BUN 34 (H) 07/02/2022 1259   CREATININE 1.03 (H) 07/02/2022 1259   GLUCOSE 155 (H) 07/02/2022 1259   CALCIUM 9.6 07/02/2022 1259   AST 24 07/02/2022 1259   ALT 17 07/02/2022 1259   ALKPHOS 75 07/02/2022 1259   BILITOT 0.3 07/02/2022 1259   PROT 7.3 07/02/2022 1259   ALBUMIN 3.9 07/02/2022 1259    RADIOGRAPHIC STUDIES: No results found.  PERFORMANCE STATUS (ECOG) : 1 - Symptomatic but completely ambulatory  Review of Systems Unless otherwise noted, a complete review of systems is negative.  Physical Exam General: NAD Cardiovascular: regular rate and rhythm Lymph: No palpable lymphadenopathy  Pulmonary: clear ant fields Extremities: no edema, no joint deformities Skin: See below Neurological: Weakness but otherwise nonfocal    Assessment and Plan- Patient is a 72 y.o. female    Encounter Diagnoses  Name Primary?   Skin rash Yes   On antineoplastic chemotherapy     Suspect dermatitis possibly from antimicrobial cleanser vs other. Could possibly be a reaction to something else or mild infection- port replacement is not off the table. Will treat with  triamcinolone cream but also cover with Bactrim given location and history. Discussed how to use the bactrim along with risks/benefits. Creatinine stable and ok for treatment today. She will stay hydrated. Discussed red flag signs and symptoms. Re-evaluation next week if the rash is not improved though high likelihood that it will begin to resolve. In the future if this occurs again we will need to isolate if this if related to cleanser vs keytruda vs bandage.    Patient expressed understanding and was in agreement with this plan. She also understands that She can call clinic at any time with any questions, concerns, or complaints.   Thank you for allowing me to participate in the care of this very pleasant patient.   Time Total: 25  Visit consisted of counseling and education dealing with the complex and emotionally intense issues of symptom management in the setting of serious illness.Greater than 50%  of this time was spent counseling and coordinating care related to the above assessment and plan.  Signed by: Clent Jacks, PA-C

## 2022-07-03 ENCOUNTER — Encounter: Payer: Self-pay | Admitting: Oncology

## 2022-07-04 ENCOUNTER — Ambulatory Visit (INDEPENDENT_AMBULATORY_CARE_PROVIDER_SITE_OTHER): Payer: Medicare PPO | Admitting: Nurse Practitioner

## 2022-07-04 ENCOUNTER — Encounter (INDEPENDENT_AMBULATORY_CARE_PROVIDER_SITE_OTHER): Payer: Self-pay | Admitting: Nurse Practitioner

## 2022-07-04 ENCOUNTER — Ambulatory Visit (INDEPENDENT_AMBULATORY_CARE_PROVIDER_SITE_OTHER): Payer: Medicare PPO

## 2022-07-04 VITALS — BP 143/74 | HR 95 | Resp 16 | Wt 277.0 lb

## 2022-07-04 DIAGNOSIS — I890 Lymphedema, not elsewhere classified: Secondary | ICD-10-CM | POA: Diagnosis not present

## 2022-07-04 DIAGNOSIS — I10 Essential (primary) hypertension: Secondary | ICD-10-CM

## 2022-07-04 NOTE — Progress Notes (Signed)
Subjective:    Patient ID: Valerie Case, female    DOB: [birth year], 72 y.o.   MRN: [id number] Chief Complaint  Patient presents with   Follow-up    Ultrasound follow up    Valerie Case is a 72 year old female that returns today for follow-up evaluation of lower extremity edema.  Initially there was concern for possible venous insufficiency.  When she initially presented to her oncologist she had blisters associated with the swelling and she was treated by antibiotics and the cellulitis and blisters largely resolved.  She has a previous history of endometrial cancer.  Since her last visit the patient has been diligently wearing medical grade compression stockings.  She notes that the swelling has greatly improved and as has the tenderness and soreness in her lower extremities.  She still has some swelling but it is notably improved.  Today noninvasive studies show no evidence of DVT or superficial thrombophlebitis bilaterally.  No evidence of deep venous insufficiency noted bilaterally.  No evidence of superficial venous reflux noted bilaterally.      Review of Systems  Cardiovascular:  Positive for leg swelling.  All other systems reviewed and are negative.      Objective:   Physical Exam Vitals reviewed.  HENT:     Head: Normocephalic.  Cardiovascular:     Rate and Rhythm: Normal rate.  Pulmonary:     Effort: Pulmonary effort is normal.  Musculoskeletal:     Right lower leg: Edema present.     Left lower leg: Edema present.  Skin:    General: Skin is warm and dry.  Neurological:     Mental Status: She is alert and oriented to person, place, and time.  Psychiatric:        Mood and Affect: Mood normal.        Behavior: Behavior normal.        Thought Content: Thought content normal.        Judgment: Judgment normal.     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    Social History   Socioeconomic History   Marital status: Divorced    Spouse name: Not on file   Number of children: Not on file   Years of education: Not on file   Highest education level: Not on file  Occupational History   Not on file  Tobacco Use   Smoking status: Never   Smokeless tobacco: Never  Vaping Use   Vaping Use: Never used  Substance and Sexual Activity   Alcohol use: Not Currently   Drug use: Never   Sexual activity: Not Currently  Other Topics Concern   Not on file  Social History Narrative      Social Determinants of Health   Financial Resource Strain: Low Risk  (09/27/2021)   Overall Financial Resource Strain (CARDIA)    Difficulty of Paying Living Expenses: Not very hard  Food Insecurity: No Food Insecurity (09/27/2021)   Hunger Vital Sign    Worried About Running Out of Food in the Last Year: Never true  Ran Out of Food in the Last Year: Never true  Transportation Needs: No Transportation Needs (09/27/2021)   PRAPARE - Administrator, Civil Service (Medical): No    Lack of Transportation (Non-Medical): No  Physical Activity: Inactive (09/27/2021)   Exercise Vital Sign    Days of Exercise per Week: 0 days    Minutes of Exercise per Session: 0 min  Stress: Stress Concern Present (09/27/2021)   [employer] of Occupational Health - Occupational Stress Questionnaire    Feeling of Stress : Rather much  Social Connections: Moderately Integrated (09/27/2021)   Social Connection and Isolation Panel [NHANES]    Frequency of Communication with Friends and Family: Three times a week    Frequency of Social Gatherings with Friends and Family: Three times a week    Attends Religious  Services: 1 to 4 times per year    Active Member of Clubs or Organizations: No    Attends Banker Meetings: 1 to 4 times per year    Marital Status: Divorced  Intimate Partner Violence: Not At Risk (09/27/2021)   Humiliation, Afraid, Rape, and Kick questionnaire    Fear of Current or Ex-Partner: No    Emotionally Abused: No    Physically Abused: No    Sexually Abused: No    Past Surgical History:  Procedure Laterality Date   CESAREAN SECTION  01/23/1979   IR IMAGING GUIDED PORT INSERTION  08/09/2021   TONSILLECTOMY  03/17/1956    Family History  Problem Relation Age of Onset   Breast cancer Mother 65   Cancer Mother    Cancer Father    Lung cancer Father    Breast cancer Cousin        dx 82s maternal    Allergies  Allergen Reactions   Doxycycline Hives    Thrush and mouth sores   Aspirin Rash    Childhood reaction        Latest Ref Rng & Units 07/02/2022   12:59 PM 06/27/2022   11:46 AM 06/06/2022    8:34 AM  CBC  WBC 4.0 - 10.5 K/uL 4.8  5.4  6.0   Hemoglobin 12.0 - 15.0 g/dL 16.1  09.6  04.5   Hematocrit 36.0 - 46.0 % 36.7  36.4  35.1   Platelets 150 - 400 K/uL 258  278  247       CMP     Component Value Date/Time   NA 138 07/02/2022 1259   K 4.4 07/02/2022 1259   CL 109 07/02/2022 1259   CO2 20 (L) 07/02/2022 1259   GLUCOSE 155 (H) 07/02/2022 1259   BUN 34 (H) 07/02/2022 1259   CREATININE 1.03 (H) 07/02/2022 1259   CALCIUM 9.6 07/02/2022 1259   PROT 7.3 07/02/2022 1259   ALBUMIN 3.9 07/02/2022 1259   AST 24 07/02/2022 1259   ALT 17 07/02/2022 1259   ALKPHOS 75 07/02/2022 1259   BILITOT 0.3 07/02/2022 1259   GFRNONAA 58 (L) 07/02/2022 1259     No results found.     Assessment & Plan:   1. Lymphedema I had a discussion with the patient and her daughter in regards to lymphedema.  Based on the patient's noninvasive studies and presentation I feel that this is likely ongoing cause of her swelling.  We discussed conservative tactics  again including use of medical grade compression, elevation and activity.  We also discussed other adjunct treatment such as a lymphedema pump.  At this time the patient  notes that the swelling is improving with the current conservative therapies of compression, elevation and activity.  Will have the patient return in 3 months in order to evaluate the progress that compression is bringing and determine if a lymphedema pump is needed at that time. - VAS [place] LOWER EXTREMITY VENOUS REFLUX  2. Essential hypertension Continue antihypertensive medications as already ordered, these medications have been reviewed and there are no changes at this time.   Current Outpatient Medications on File Prior to Visit  Medication Sig Dispense Refill   acetaminophen (TYLENOL) 650 MG CR tablet Take 1,300 mg by mouth every 8 (eight) hours as needed for pain.     albuterol (VENTOLIN HFA) 108 (90 Base) MCG/ACT inhaler Inhale 2 puffs into the lungs every 6 (six) hours as needed for wheezing or shortness of breath.     amoxicillin-clavulanate (AUGMENTIN) 875-125 MG tablet Take 1 tablet by mouth 2 (two) times daily. 10 tablet 0   Cranberry-Vitamin C-Probiotic (AZO CRANBERRY PO) Take by mouth.     docusate sodium (COLACE) 100 MG capsule Take 1 capsule (100 mg total) by mouth 2 (two) times daily. 60 capsule 1   empagliflozin (JARDIANCE) 25 MG TABS tablet Take 25 mg by mouth daily.     ferrous sulfate 325 (65 FE) MG EC tablet Take 325 mg by mouth daily with breakfast.     FLUoxetine (PROZAC) 10 MG capsule Take 10 mg by mouth daily.     fluticasone (FLONASE) 50 MCG/ACT nasal spray Place 1 spray into both nostrils daily as needed for allergies or rhinitis.     hydrochlorothiazide (HYDRODIURIL) 12.5 MG tablet Take 12.5 mg by mouth daily.     hydrocortisone 2.5 % cream Apply topically 3 (three) times daily. 30 g 2   ibuprofen (ADVIL) 200 MG tablet Take 400 mg by mouth every 8 (eight) hours as needed for moderate pain.      lidocaine-prilocaine (EMLA) cream Apply to affected area once 30 g 3   lisinopril (ZESTRIL) 40 MG tablet Take 40 mg by mouth at bedtime.     LORazepam (ATIVAN) 0.5 MG tablet Take 1 tablet (0.5 mg total) by mouth every 8 (eight) hours as needed for anxiety (nausea vomiting). 30 tablet 0   metFORMIN (GLUCOPHAGE-XR) 500 MG 24 hr tablet Take 1,000 mg by mouth at bedtime.     metoprolol succinate (TOPROL-XL) 25 MG 24 hr tablet Take 25 mg by mouth daily.     mupirocin ointment (BACTROBAN) 2 % Apply 1 Application topically 3 (three) times daily. 22 g 0   NOVOLIN 70/30 FLEXPEN (70-30) 100 UNIT/ML KwikPen 50-110 Units See admin instructions. 50 units in the morning, 110 units at bedtime     OZEMPIC, 1 MG/DOSE, 4 MG/3ML SOPN Inject 1 mg into the skin every Tuesday.     prochlorperazine (COMPAZINE) 10 MG tablet Take 1 tablet (10 mg total) by mouth every 6 (six) hours as needed (Nausea or vomiting). 30 tablet 1   rosuvastatin (CRESTOR) 10 MG tablet Take 10 mg by mouth daily.     sulfamethoxazole-trimethoprim (BACTRIM DS) 800-160 MG tablet Take 1 tablet by mouth 2 (two) times daily for 7 days. 14 tablet 0   traMADol (ULTRAM) 50 MG tablet Take 2 tablets (100 mg total) by mouth every 6 (six) hours as needed. 90 tablet 0   triamcinolone ointment (KENALOG) 0.5 % Apply 1 Application topically 2 (two) times daily. 30 g 0   ondansetron (ZOFRAN) 8 MG tablet Take 1 tablet (8 mg total)  by mouth 2 (two) times daily as needed. Start on the third day after chemotherapy. (Patient not taking: Reported on 05/16/2022) 30 tablet 1   potassium chloride SA (KLOR-CON M) 20 MEQ tablet Take 1 tablet (20 mEq total) by mouth daily. (Patient not taking: Reported on 01/31/2022) 3 tablet 0   Current Facility-Administered Medications on File Prior to Visit  Medication Dose Route Frequency Provider Last Rate Last Admin   heparin lock flush 100 UNIT/ML injection             There are no Patient Instructions on file for this visit. No  follow-ups on file.   Georgiana Spinner, NP

## 2022-07-18 ENCOUNTER — Inpatient Hospital Stay: Payer: Medicare PPO

## 2022-07-18 ENCOUNTER — Inpatient Hospital Stay: Payer: Medicare PPO | Admitting: Oncology

## 2022-07-18 ENCOUNTER — Inpatient Hospital Stay: Payer: Medicare PPO | Attending: Obstetrics and Gynecology

## 2022-07-18 ENCOUNTER — Encounter: Payer: Self-pay | Admitting: Oncology

## 2022-07-18 VITALS — BP 103/53 | HR 87 | Temp 98.0°F | Resp 18 | Wt 279.6 lb

## 2022-07-18 DIAGNOSIS — I872 Venous insufficiency (chronic) (peripheral): Secondary | ICD-10-CM | POA: Insufficient documentation

## 2022-07-18 DIAGNOSIS — Z803 Family history of malignant neoplasm of breast: Secondary | ICD-10-CM | POA: Insufficient documentation

## 2022-07-18 DIAGNOSIS — I2510 Atherosclerotic heart disease of native coronary artery without angina pectoris: Secondary | ICD-10-CM | POA: Insufficient documentation

## 2022-07-18 DIAGNOSIS — Z809 Family history of malignant neoplasm, unspecified: Secondary | ICD-10-CM | POA: Diagnosis not present

## 2022-07-18 DIAGNOSIS — R160 Hepatomegaly, not elsewhere classified: Secondary | ICD-10-CM | POA: Diagnosis not present

## 2022-07-18 DIAGNOSIS — D6481 Anemia due to antineoplastic chemotherapy: Secondary | ICD-10-CM | POA: Diagnosis not present

## 2022-07-18 DIAGNOSIS — E119 Type 2 diabetes mellitus without complications: Secondary | ICD-10-CM | POA: Insufficient documentation

## 2022-07-18 DIAGNOSIS — I10 Essential (primary) hypertension: Secondary | ICD-10-CM | POA: Insufficient documentation

## 2022-07-18 DIAGNOSIS — Z5112 Encounter for antineoplastic immunotherapy: Secondary | ICD-10-CM

## 2022-07-18 DIAGNOSIS — Z79899 Other long term (current) drug therapy: Secondary | ICD-10-CM | POA: Diagnosis not present

## 2022-07-18 DIAGNOSIS — T451X5A Adverse effect of antineoplastic and immunosuppressive drugs, initial encounter: Secondary | ICD-10-CM

## 2022-07-18 DIAGNOSIS — M2550 Pain in unspecified joint: Secondary | ICD-10-CM | POA: Insufficient documentation

## 2022-07-18 DIAGNOSIS — Z79620 Long term (current) use of immunosuppressive biologic: Secondary | ICD-10-CM | POA: Insufficient documentation

## 2022-07-18 DIAGNOSIS — R5383 Other fatigue: Secondary | ICD-10-CM | POA: Insufficient documentation

## 2022-07-18 DIAGNOSIS — M47817 Spondylosis without myelopathy or radiculopathy, lumbosacral region: Secondary | ICD-10-CM | POA: Insufficient documentation

## 2022-07-18 DIAGNOSIS — R21 Rash and other nonspecific skin eruption: Secondary | ICD-10-CM | POA: Diagnosis not present

## 2022-07-18 DIAGNOSIS — Z886 Allergy status to analgesic agent status: Secondary | ICD-10-CM | POA: Insufficient documentation

## 2022-07-18 DIAGNOSIS — Z86018 Personal history of other benign neoplasm: Secondary | ICD-10-CM | POA: Insufficient documentation

## 2022-07-18 DIAGNOSIS — C541 Malignant neoplasm of endometrium: Secondary | ICD-10-CM | POA: Diagnosis not present

## 2022-07-18 DIAGNOSIS — I700 Atherosclerosis of aorta: Secondary | ICD-10-CM | POA: Diagnosis not present

## 2022-07-18 DIAGNOSIS — Z801 Family history of malignant neoplasm of trachea, bronchus and lung: Secondary | ICD-10-CM | POA: Insufficient documentation

## 2022-07-18 DIAGNOSIS — Z923 Personal history of irradiation: Secondary | ICD-10-CM | POA: Insufficient documentation

## 2022-07-18 DIAGNOSIS — Z881 Allergy status to other antibiotic agents status: Secondary | ICD-10-CM | POA: Diagnosis not present

## 2022-07-18 DIAGNOSIS — E278 Other specified disorders of adrenal gland: Secondary | ICD-10-CM

## 2022-07-18 DIAGNOSIS — M4316 Spondylolisthesis, lumbar region: Secondary | ICD-10-CM | POA: Insufficient documentation

## 2022-07-18 LAB — CBC WITH DIFFERENTIAL/PLATELET
Abs Immature Granulocytes: 0.03 10*3/uL (ref 0.00–0.07)
Basophils Absolute: 0 10*3/uL (ref 0.0–0.1)
Basophils Relative: 1 %
Eosinophils Absolute: 0.3 10*3/uL (ref 0.0–0.5)
Eosinophils Relative: 5 %
HCT: 33.4 % — ABNORMAL LOW (ref 36.0–46.0)
Hemoglobin: 10.7 g/dL — ABNORMAL LOW (ref 12.0–15.0)
Immature Granulocytes: 1 %
Lymphocytes Relative: 18 %
Lymphs Abs: 1 10*3/uL (ref 0.7–4.0)
MCH: 30.4 pg (ref 26.0–34.0)
MCHC: 32 g/dL (ref 30.0–36.0)
MCV: 94.9 fL (ref 80.0–100.0)
Monocytes Absolute: 0.6 10*3/uL (ref 0.1–1.0)
Monocytes Relative: 10 %
Neutro Abs: 3.7 10*3/uL (ref 1.7–7.7)
Neutrophils Relative %: 65 %
Platelets: 255 10*3/uL (ref 150–400)
RBC: 3.52 MIL/uL — ABNORMAL LOW (ref 3.87–5.11)
RDW: 13.8 % (ref 11.5–15.5)
WBC: 5.6 10*3/uL (ref 4.0–10.5)
nRBC: 0 % (ref 0.0–0.2)

## 2022-07-18 LAB — COMPREHENSIVE METABOLIC PANEL
ALT: 14 U/L (ref 0–44)
AST: 18 U/L (ref 15–41)
Albumin: 3.5 g/dL (ref 3.5–5.0)
Alkaline Phosphatase: 64 U/L (ref 38–126)
Anion gap: 8 (ref 5–15)
BUN: 44 mg/dL — ABNORMAL HIGH (ref 8–23)
CO2: 20 mmol/L — ABNORMAL LOW (ref 22–32)
Calcium: 10 mg/dL (ref 8.9–10.3)
Chloride: 111 mmol/L (ref 98–111)
Creatinine, Ser: 1.05 mg/dL — ABNORMAL HIGH (ref 0.44–1.00)
GFR, Estimated: 57 mL/min — ABNORMAL LOW (ref >60)
Glucose, Bld: 99 mg/dL (ref 70–99)
Potassium: 4.6 mmol/L (ref 3.5–5.1)
Sodium: 139 mmol/L (ref 135–145)
Total Bilirubin: 0.2 mg/dL — ABNORMAL LOW (ref 0.3–1.2)
Total Protein: 6.8 g/dL (ref 6.5–8.1)

## 2022-07-18 LAB — TSH: TSH: 2.338 u[IU]/mL (ref 0.350–4.500)

## 2022-07-18 MED ORDER — HEPARIN SOD (PORK) LOCK FLUSH 100 UNIT/ML IV SOLN
500.0000 [IU] | Freq: Once | INTRAVENOUS | Status: AC | PRN
  Administered 2022-07-18: 500 [IU]
  Filled 2022-07-18: qty 5

## 2022-07-18 MED ORDER — SODIUM CHLORIDE 0.9 % IV SOLN
200.0000 mg | Freq: Once | INTRAVENOUS | Status: AC
  Administered 2022-07-18: 200 mg via INTRAVENOUS
  Filled 2022-07-18: qty 8

## 2022-07-18 MED ORDER — SODIUM CHLORIDE 0.9 % IV SOLN
Freq: Once | INTRAVENOUS | Status: AC
  Filled 2022-07-18: qty 250

## 2022-07-18 MED ORDER — SODIUM CHLORIDE 0.9% FLUSH
10.0000 mL | INTRAVENOUS | Status: DC | PRN
  Filled 2022-07-18: qty 10

## 2022-07-18 NOTE — Assessment & Plan Note (Addendum)
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 s/p carboplatin and Taxol for 5 cycles, on Keytruda maintenance.  CT showed NED Labs are reviewed and discussed with patient. Proceed with Keytruda Repeat CT in late May/June

## 2022-07-18 NOTE — Assessment & Plan Note (Signed)
She is currently on immunotherapy.  Hemoglobin improved.  

## 2022-07-18 NOTE — Assessment & Plan Note (Signed)
Treatment plan as listed above. 

## 2022-07-18 NOTE — Progress Notes (Signed)
Patient here for follow up. No new concerns voiced.  °

## 2022-07-18 NOTE — Assessment & Plan Note (Signed)
lipid poor hyperfunctioning adenoma vs mets.  FDG avid on PET scan.  Difficult biopsy due to patient's body habitus and deep position of the mass. Resolved on PET scan, suggesting that this lesion might be a metastatic lesion. Continue monitor  

## 2022-07-18 NOTE — Patient Instructions (Signed)

## 2022-07-18 NOTE — Assessment & Plan Note (Signed)
Improved, continue topical hydrocortisone cream to skin rash.

## 2022-07-18 NOTE — Patient Instructions (Signed)
[facility]  Discharge Instructions: Thank you for choosing [facility] to provide your oncology and hematology care.  If you have a lab appointment with the Cancer Center, please go directly to the Cancer Center and check in at the registration area.  Wear comfortable clothing and clothing appropriate for easy access to any Portacath or PICC line.   We strive to give you quality time with your provider. You may need to reschedule your appointment if you arrive late (15 or more minutes).  Arriving late affects you and other patients whose appointments are after yours.  Also, if you miss three or more appointments without notifying the office, you may be dismissed from the clinic at the provider's discretion.      For prescription refill requests, have your pharmacy contact our office and allow 72 hours for refills to be completed.    Today you received the following chemotherapy and/or immunotherapy agents- keytruda      To help prevent nausea and vomiting after your treatment, we encourage you to take your nausea medication as directed.  BELOW ARE SYMPTOMS THAT SHOULD BE REPORTED IMMEDIATELY: *FEVER GREATER THAN 100.4 F (38 C) OR HIGHER *CHILLS OR SWEATING *NAUSEA AND VOMITING THAT IS NOT CONTROLLED WITH YOUR NAUSEA MEDICATION *UNUSUAL SHORTNESS OF BREATH *UNUSUAL BRUISING OR BLEEDING *URINARY PROBLEMS (pain or burning when urinating, or frequent urination) *BOWEL PROBLEMS (unusual diarrhea, constipation, pain near the anus) TENDERNESS IN MOUTH AND THROAT WITH OR WITHOUT PRESENCE OF ULCERS (sore throat, sores in mouth, or a toothache) UNUSUAL RASH, SWELLING OR PAIN  UNUSUAL VAGINAL DISCHARGE OR ITCHING   Items with * indicate a potential emergency and should be followed up as soon as possible or go to the Emergency Department if any problems should occur.  Please show the CHEMOTHERAPY ALERT CARD or IMMUNOTHERAPY ALERT CARD at check-in to  the Emergency Department and triage nurse.  Should you have questions after your visit or need to cancel or reschedule your appointment, please contact [facility]  [phone] and follow the prompts.  Office hours are 8:00 a.m. to 4:30 p.m. Monday - Friday. Please note that voicemails left after 4:00 p.m. may not be returned until the following business day.  We are closed weekends and major holidays. You have access to a nurse at all times for urgent questions. Please call the main number to the clinic [phone] and follow the prompts.  For any non-urgent questions, you may also contact your provider using MyChart. We now offer e-Visits for anyone 18 and older to request care online for non-urgent symptoms. For details visit [email or web address].   Also download the MyChart app! Go to the app store, search "MyChart", open the app, select [facility], and log in with your MyChart username and password.    

## 2022-07-18 NOTE — Progress Notes (Signed)
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 s/p carboplatin and Taxol for 5 cycles, on Keytruda maintenance.  CT showed NED Labs are reviewed and discussed with patient. Proceed with Keytruda Repeat CT in late May/June   Anemia due to antineoplastic chemotherapy She is currently on immunotherapy.  Hemoglobin improved.   Encounter for antineoplastic immunotherapy Treatment plan as listed above.   Skin rash Improved, continue topical hydrocortisone cream to skin rash.  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  FDG avid on PET scan.  Difficult biopsy due to patient's body habitus and deep position of the mass. Resolved on PET scan, suggesting that this lesion might be a metastatic lesion. Continue monitor     Orders Placed This Encounter  Procedures   CBC with Differential    Standing Status:   Future    Standing Expiration Date:   09/19/2023   Comprehensive metabolic panel    Standing Status:   Future    Standing Expiration Date:   09/19/2023   TSH    Standing Status:   Future    Standing Expiration Date:   09/19/2023     Return of visit:  3 weeks Lab MD Greggory Brandy, MD, [name] [facility] Hematology Oncology 07/18/2022     HISTORY OF PRESENTING ILLNESS:   Valerie Case is a  72 y.o.  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 Oncology History  Endometrial adenocarcinoma (HCC)  12/12/2020  Imaging   12/12/2020, CT abdomen pelvis with contrast showed no radiographic evidence of urinary tract neoplasm, calculi, hydronephrosis.  2.6 cm nonspecific left adrenal mass. 02/12/2021 CT hematuria work-up showed small uterine fibroids, no findings to explain hematuria.Hepatomegaly, diverticulosis without evidence of diverticulitis, Coronary artery disease   04/01/2021 -  Hospital Admission   04/01/2021 - 04/03/2021, patient was hospitalized due to symptomatic anemia, hemoglobin 6.9, heavy postmenopausal bleeding with passing large clots.  She also presented with increased creatinine level to 2.58 compared to her baseline level of 0.9 in November 2022.  Patient received IV iron infusion and 2 units of PRBC during the hospital stay..  04/02/2021, iron panel showed iron saturation 37, ferritin 37, TIBC 333-the studies were done after patient received blood transfusion.  At discharge, hemoglobin was 7.7.   04/01/2021 Initial Diagnosis   04/01/2020, endometrial biopsy showed poorly differentiated endometrial adenocarcinoma. Omniseq NGS showed TMB 48.4 mt/mb [high], MSI High, PD-L1-TPS <1%, PIK3CA H1047Q, Negative for BRAF, HER2, NTRK1 RET.     04/10/2021 Cancer Staging   Staging form: Corpus Uteri - Carcinoma and Carcinosarcoma, AJCC 8th Edition - Clinical stage from 04/10/2021: FIGO Stage III, calculated as Stage Unknown (cT3, cNX, cM0) - Signed by Rickard Patience, MD on 08/24/2021 Stage prefix: Initial diagnosis   04/15/2021 Imaging   PET showed 1. Large hypermetabolic endometrial mass consistent with known endometrial cancer. Suspect direct invasion/involvement of the right adnexa. Right pelvic sidewall hypermetabolic adenopathy.2. 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 3.  No findings for metastatic disease involving the chest or bonystructures.     04/24/2021 - 05/29/2021 Radiation Therapy   status post radiation  to pelvis   08/05/2021 Imaging   MRI abdomen w wo contrast 1. Stable solid enhancing 2.7 cm left adrenal lesion, which was hypermetabolic on prior PET/CTs but is unchanged in size dating back to December 12, 2020. 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 However, unfortunately metastatic disease can not be entirely excluded and remains a pertinent differential consideration. Comparison with more remote prior imaging would be the most valuable tool in the assessment of this lesion, as demonstrating long-term stability would indicate this to be a benign lesion. However, if no prior imaging can be made available, would consider follow-up adrenal protocol CT with and without intravenous contrast material in 3 months as this would allow for further assessment of stability as well as enhancement and washout characteristics of the lesion potentially allowing for better characterization versus direct tissue sampling.2. Hepatomegaly and hepatic steatosis.3. Colonic diverticulosis without findings of acute diverticulitis    08/16/2021 - 09/27/2021 Chemotherapy   UTERINE Carboplatin AUC 5 / Paclitaxel q21d x 3      10/03/2021 Imaging   PET 1. No residual hypermetabolism in the uterus suggesting an excellent response to treatment. No findings for metastatic disease. 2. Resolution of hypermetabolism in the left adrenal gland lesion suggesting this was metastatic disease. 3. Diffuse marrow hypermetabolism likely due to rebound from chemotherapy or marrow stimulating drugs   10/18/2021 - 11/08/2021 Chemotherapy   AUC 5 / Paclitaxel/ Keytruda Q21d  x 2 cycles   11/29/2021 -  Chemotherapy   Patient is on Treatment Plan : UTERINE Pembrolizumab (200) q21d     04/30/2022 Imaging   CT chest abdomen pelvis w contrast  1. Similar to minimal decrease in  size of a left adrenal nodule which was felt suspicious for metastatic disease on 10/03/2021 PET. 2. No evidence of new or progressive disease. 3. Coronary artery atherosclerosis. Aortic Atherosclerosis (ICD10-I70.0). 4. Incidental findings, including: Hepatomegaly. Possible constipation.       INTERVAL HISTORY Valerie Case is a 72 y.o. female who has above history reviewed by me today presents for follow up visit for anemia and endometrial cancer Overall she tolerates treatment, No new complaints.  Patient denies nausea vomiting diarrhea. Skin itchy, she takes benadryl PRN, and hydrocortisone topically.      Review of Systems  Constitutional:  Positive for fatigue. Negative for chills and fever.  HENT:   Negative for hearing loss and voice change.   Eyes:  Negative for eye problems.  Respiratory:  Negative for chest tightness and cough.   Cardiovascular:  Negative for chest pain.  Gastrointestinal:  Negative for abdominal distention, abdominal pain and blood in stool.  Endocrine: Negative for hot flashes.  Genitourinary:  Negative for difficulty urinating, frequency and vaginal bleeding.   Musculoskeletal:  Positive for arthralgias.  Skin:  Positive for rash. Negative for itching.  Neurological:  Negative for extremity weakness.  Hematological:  Negative for adenopathy.  Psychiatric/Behavioral:  Negative for confusion.     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    SURGICAL HISTORY: Past Surgical History:  Procedure Laterality Date   CESAREAN SECTION  01/23/1979   IR IMAGING GUIDED PORT INSERTION  08/09/2021   TONSILLECTOMY  03/17/1956    SOCIAL HISTORY: Social History   Socioeconomic  History   Marital status: Divorced    Spouse name: Not on file   Number of children: Not on file   Years of education: Not on file   Highest education level: Not on file  Occupational History   Not on file  Tobacco Use   Smoking status: Never   Smokeless tobacco: Never  Vaping Use   Vaping Use: Never used  Substance and Sexual Activity   Alcohol use: Not Currently   Drug use: Never   Sexual activity: Not Currently  Other Topics Concern   Not on file  Social History Narrative      Social Determinants of Health   Financial Resource Strain: Low Risk  (09/27/2021)   Overall Financial Resource Strain (CARDIA)    Difficulty of Paying Living Expenses: Not very hard  Food Insecurity: No Food Insecurity (09/27/2021)   Hunger Vital Sign    Worried About Running Out of Food in the Last Year: Never true    Ran Out of Food in the Last Year: Never true  Transportation Needs: No Transportation Needs (09/27/2021)   PRAPARE - Administrator, Civil Service (Medical): No    Lack of Transportation (Non-Medical): No  Physical Activity: Inactive (09/27/2021)   Exercise Vital Sign    Days of Exercise per Week: 0 days    Minutes of Exercise per Session: 0 min  Stress: Stress Concern Present (09/27/2021)   [employer] of Occupational Health - Occupational Stress Questionnaire    Feeling of Stress : Rather much  Social Connections: Moderately Integrated (09/27/2021)   Social Connection and Isolation Panel [NHANES]    Frequency of Communication with Friends and Family: Three times a week    Frequency of Social Gatherings with Friends and Family: Three times a week    Attends Religious Services: 1 to 4 times per year    Active Member of Clubs or Organizations: No    Attends Banker Meetings: 1 to 4 times per year    Marital Status: Divorced  Intimate Partner Violence: Not At Risk (09/27/2021)   Humiliation, Afraid, Rape, and Kick questionnaire    Fear of Current or  Ex-Partner: No    Emotionally Abused: No    Physically Abused: No    Sexually Abused: No    FAMILY HISTORY: Family History  Problem Relation Age of Onset   Breast cancer Mother 65   Cancer Mother    Cancer Father    Lung cancer Father    Breast cancer Cousin        dx 55s maternal    ALLERGIES:  is allergic to doxycycline and aspirin.  MEDICATIONS:  Current Outpatient Medications  Medication Sig Dispense Refill   acetaminophen (TYLENOL) 650 MG CR tablet Take 1,300 mg by mouth every 8 (eight) hours as needed for pain.     albuterol (VENTOLIN HFA) 108 (90 Base) MCG/ACT inhaler Inhale 2 puffs into the lungs every 6 (six) hours as needed for wheezing or shortness of breath.     Cranberry-Vitamin C-Probiotic (AZO CRANBERRY PO) Take by mouth.     docusate sodium (COLACE) 100 MG capsule Take 1 capsule (100 mg total) by mouth  2 (two) times daily. 60 capsule 1   empagliflozin (JARDIANCE) 25 MG TABS tablet Take 25 mg by mouth daily.     ferrous sulfate 325 (65 FE) MG EC tablet Take 325 mg by mouth daily with breakfast.     FLUoxetine (PROZAC) 10 MG capsule Take 10 mg by mouth daily.     fluticasone (FLONASE) 50 MCG/ACT nasal spray Place 1 spray into both nostrils daily as needed for allergies or rhinitis.     hydrochlorothiazide (HYDRODIURIL) 12.5 MG tablet Take 12.5 mg by mouth daily.     hydrocortisone 2.5 % cream Apply topically 3 (three) times daily. 30 g 2   ibuprofen (ADVIL) 200 MG tablet Take 400 mg by mouth every 8 (eight) hours as needed for moderate pain.     lidocaine-prilocaine (EMLA) cream Apply to affected area once 30 g 3   lisinopril (ZESTRIL) 40 MG tablet Take 40 mg by mouth at bedtime.     LORazepam (ATIVAN) 0.5 MG tablet Take 1 tablet (0.5 mg total) by mouth every 8 (eight) hours as needed for anxiety (nausea vomiting). 30 tablet 0   metFORMIN (GLUCOPHAGE-XR) 500 MG 24 hr tablet Take 1,000 mg by mouth at bedtime.     metoprolol succinate (TOPROL-XL) 25 MG 24 hr tablet  Take 25 mg by mouth daily.     mupirocin ointment (BACTROBAN) 2 % Apply 1 Application topically 3 (three) times daily. 22 g 0   NOVOLIN 70/30 FLEXPEN (70-30) 100 UNIT/ML KwikPen 50-110 Units See admin instructions. 50 units in the morning, 110 units at bedtime     ondansetron (ZOFRAN) 8 MG tablet Take 1 tablet (8 mg total) by mouth 2 (two) times daily as needed. Start on the third day after chemotherapy. 30 tablet 1   OZEMPIC, 1 MG/DOSE, 4 MG/3ML SOPN Inject 1 mg into the skin every Tuesday.     potassium chloride SA (KLOR-CON M) 20 MEQ tablet Take 1 tablet (20 mEq total) by mouth daily. 3 tablet 0   prochlorperazine (COMPAZINE) 10 MG tablet Take 1 tablet (10 mg total) by mouth every 6 (six) hours as needed (Nausea or vomiting). 30 tablet 1   rosuvastatin (CRESTOR) 10 MG tablet Take 10 mg by mouth daily.     traMADol (ULTRAM) 50 MG tablet Take 2 tablets (100 mg total) by mouth every 6 (six) hours as needed. 90 tablet 0   triamcinolone ointment (KENALOG) 0.5 % Apply 1 Application topically 2 (two) times daily. 30 g 0   No current facility-administered medications for this visit.   Facility-Administered Medications Ordered in Other Visits  Medication Dose Route Frequency Provider Last Rate Last Admin   heparin lock flush 100 UNIT/ML injection            sodium chloride flush (NS) 0.9 % injection 10 mL  10 mL Intracatheter PRN Rickard Patience, MD         PHYSICAL EXAMINATION: ECOG PERFORMANCE STATUS: 1 - Symptomatic but completely ambulatory  Physical Exam Constitutional:      General: She is not in acute distress.    Appearance: She is obese.  HENT:     Head: Normocephalic and atraumatic.  Eyes:     General: No scleral icterus. Cardiovascular:     Rate and Rhythm: Normal rate.  Pulmonary:     Effort: Pulmonary effort is normal. No respiratory distress.     Breath sounds: No wheezing.  Abdominal:     General: Bowel sounds are normal. There is no distension.  Palpations: Abdomen is  soft.  Musculoskeletal:        General: No deformity. Normal range of motion.     Cervical back: Normal range of motion.     Right lower leg: Edema present.     Left lower leg: Edema present.     Comments:    Skin:    General: Skin is warm and dry.     Findings: Rash present.     Comments: Chronic venous sufficiency skin changes  Neurological:     Mental Status: She is alert and oriented to person, place, and time. Mental status is at baseline.     Cranial Nerves: No cranial nerve deficit.     Coordination: Coordination normal.  Psychiatric:        Mood and Affect: Mood normal.    LABORATORY DATA:  I have reviewed the data as listed    Latest Ref Rng & Units 07/18/2022    9:52 AM 07/02/2022   12:59 PM 06/27/2022   11:46 AM  CBC  WBC 4.0 - 10.5 K/uL 5.6  4.8  5.4   Hemoglobin 12.0 - 15.0 g/dL 19.1  47.8  29.5   Hematocrit 36.0 - 46.0 % 33.4  36.7  36.4   Platelets 150 - 400 K/uL 255  258  278       Latest Ref Rng & Units 07/18/2022    9:52 AM 07/02/2022   12:59 PM 06/27/2022   11:46 AM  CMP  Glucose 70 - 99 mg/dL 99  621  308   BUN 8 - 23 mg/dL 44  34  41   Creatinine 0.44 - 1.00 mg/dL 6.57  8.46  9.62   Sodium 135 - 145 mmol/L 139  138  136   Potassium 3.5 - 5.1 mmol/L 4.6  4.4  4.5   Chloride 98 - 111 mmol/L 111  109  106   CO2 22 - 32 mmol/L 20  20  20    Calcium 8.9 - 10.3 mg/dL 95.2  9.6  84.1   Total Protein 6.5 - 8.1 g/dL 6.8  7.3  7.6   Total Bilirubin 0.3 - 1.2 mg/dL 0.2  0.3  0.4   Alkaline Phos 38 - 126 U/L 64  75  72   AST 15 - 41 U/L 18  24  21    ALT 0 - 44 U/L 14  17  16        RADIOGRAPHIC STUDIES: I have personally reviewed the radiological images as listed and agreed with the findings in the report. VAS [place] LOWER EXTREMITY VENOUS REFLUX  Result Date: 07/07/2022  Lower Venous Reflux Study Patient Name:  KAJOL BURDA  Date of Exam:   07/04/2022 Medical Rec #: [id number]           Accession #:    [id number] Date of Birth: [birth year]           Patient  Gender: F Patient Age:   67 years Exam Location:  [facility] Procedure:      VAS [place] LOWER EXTREMITY VENOUS REFLUX Referring Phys: Sheppard Plumber --------------------------------------------------------------------------------  Indications: Swelling, and Pain.  Performing Technologist: Salvadore Farber RVT  Examination Guidelines: A complete evaluation includes B-mode imaging, spectral Doppler, color Doppler, and power Doppler as needed of all accessible portions of each vessel. Bilateral testing is considered an integral part of a complete examination. Limited examinations for reoccurring indications may be performed as noted. The reflux portion of the exam is performed with the patient  in reverse Trendelenburg. Significant venous reflux is defined as >500 ms in the superficial venous system, and >1 second in the deep venous system.   Summary: Bilateral: - No evidence of deep vein thrombosis seen in the lower extremities, bilaterally, from the common femoral through the popliteal veins. - No evidence of superficial venous thrombosis in the lower extremities, bilaterally. - No evidence of deep venous insufficiency seen bilaterally in the lower extremity. - No evidence of superficial venous reflux seen in the greater saphenous veins bilaterally. - No evidence of superficial venous reflux seen in the short saphenous veins bilaterally.  *See table(s) above for measurements and observations. Electronically signed by Festus Barren MD on 07/07/2022 at 7:27:20 AM.    Final    CT CHEST ABDOMEN PELVIS W CONTRAST  Result Date: 04/30/2022 CLINICAL DATA:  Adenocarcinoma of the endometrium. Diagnosed in January of 2023. Chemotherapy and radiation therapy finished last fall. Currently on immunotherapy. * Tracking Code: BO * EXAM: CT CHEST, ABDOMEN, AND PELVIS WITH CONTRAST TECHNIQUE: Multidetector CT imaging of the chest, abdomen and pelvis was performed following the standard protocol during bolus administration of  intravenous contrast. RADIATION DOSE REDUCTION: This exam was performed according to the departmental dose-optimization program which includes automated exposure control, adjustment of the mA and/or kV according to patient size and/or use of iterative reconstruction technique. CONTRAST:  OMNIPAQUE IOHEXOL 300 MG/ML  SOLN COMPARISON:  01/22/2022 FINDINGS: CT CHEST FINDINGS Cardiovascular: Right Port-A-Cath tip high right atrium. Aortic atherosclerosis. Tortuous thoracic aorta. Mild cardiomegaly, without pericardial effusion. Three vessel coronary artery calcification. No central pulmonary embolism, on this non-dedicated study. Mediastinum/Nodes: No supraclavicular adenopathy. No mediastinal or hilar adenopathy. Lungs/Pleura: No pleural fluid.  Clear lungs. Musculoskeletal: No acute osseous abnormality. CT ABDOMEN PELVIS FINDINGS Hepatobiliary: Segment 4A 2.9 cm hepatic cyst or minimally complex cyst is similar. Other smaller low-density lesions are similar and likely cysts. Hepatomegaly again identified at approximately 22 cm craniocaudal. Normal gallbladder, without biliary ductal dilatation. Pancreas: Normal, without mass or ductal dilatation. Spleen: Normal in size, without focal abnormality. Adrenals/Urinary Tract: Normal right adrenal gland and kidney. Left adrenal nodule measures 2.7 by 2.2 cm on 52/2 versus 2.8 x 2.4 cm on the prior exam (when remeasured). An interpolar left renal 1.4 cm hypoattenuating lesion can be presumed a cyst or minimally complex cyst and is similar to on the prior exam . In the absence of clinically indicated signs/symptoms require(s) no independent follow-up. No hydronephrosis.  Normal urinary bladder. Stomach/Bowel: Normal stomach, without wall thickening. Extensive colonic diverticulosis. Colonic stool burden suggests constipation. Normal terminal ileum. Normal small bowel. Vascular/Lymphatic: Aortic atherosclerosis. No abdominopelvic adenopathy. Reproductive: Subtle foci of  hypoattenuation within the uterus at 2 mm on 104/2 and 106/2 are similar to on the prior could be treatment related trace gas. No endometrial or myometrial mass. No adnexal mass. Other: No significant free fluid. No evidence of omental or peritoneal disease. Musculoskeletal: Lumbosacral spondylosis with trace L4-5 anterolisthesis. IMPRESSION: 1. Similar to minimal decrease in size of a left adrenal nodule which was felt suspicious for metastatic disease on 10/03/2021 PET. 2. No evidence of new or progressive disease. 3. Coronary artery atherosclerosis. Aortic Atherosclerosis (ICD10-I70.0). 4. Incidental findings, including: Hepatomegaly. Possible constipation. Electronically Signed   By: Jeronimo Greaves M.D.   On: 04/30/2022 09:51

## 2022-08-06 ENCOUNTER — Inpatient Hospital Stay: Payer: Medicare PPO

## 2022-08-08 ENCOUNTER — Encounter: Payer: Self-pay | Admitting: Oncology

## 2022-08-08 ENCOUNTER — Inpatient Hospital Stay: Payer: Medicare PPO | Admitting: Oncology

## 2022-08-08 ENCOUNTER — Inpatient Hospital Stay: Payer: Medicare PPO

## 2022-08-08 VITALS — HR 107

## 2022-08-08 VITALS — BP 123/69 | HR 109 | Temp 97.6°F | Resp 19 | Wt 280.1 lb

## 2022-08-08 DIAGNOSIS — E278 Other specified disorders of adrenal gland: Secondary | ICD-10-CM | POA: Diagnosis not present

## 2022-08-08 DIAGNOSIS — C541 Malignant neoplasm of endometrium: Secondary | ICD-10-CM

## 2022-08-08 DIAGNOSIS — I872 Venous insufficiency (chronic) (peripheral): Secondary | ICD-10-CM | POA: Diagnosis not present

## 2022-08-08 DIAGNOSIS — T451X5A Adverse effect of antineoplastic and immunosuppressive drugs, initial encounter: Secondary | ICD-10-CM

## 2022-08-08 DIAGNOSIS — Z5112 Encounter for antineoplastic immunotherapy: Secondary | ICD-10-CM

## 2022-08-08 DIAGNOSIS — T7840XA Allergy, unspecified, initial encounter: Secondary | ICD-10-CM

## 2022-08-08 DIAGNOSIS — D6481 Anemia due to antineoplastic chemotherapy: Secondary | ICD-10-CM | POA: Diagnosis not present

## 2022-08-08 LAB — CBC WITH DIFFERENTIAL/PLATELET
Abs Immature Granulocytes: 0.04 10*3/uL (ref 0.00–0.07)
Basophils Absolute: 0 10*3/uL (ref 0.0–0.1)
Basophils Relative: 1 %
Eosinophils Absolute: 0.2 10*3/uL (ref 0.0–0.5)
Eosinophils Relative: 4 %
HCT: 34.5 % — ABNORMAL LOW (ref 36.0–46.0)
Hemoglobin: 11.1 g/dL — ABNORMAL LOW (ref 12.0–15.0)
Immature Granulocytes: 1 %
Lymphocytes Relative: 14 %
Lymphs Abs: 0.7 10*3/uL (ref 0.7–4.0)
MCH: 30.7 pg (ref 26.0–34.0)
MCHC: 32.2 g/dL (ref 30.0–36.0)
MCV: 95.6 fL (ref 80.0–100.0)
Monocytes Absolute: 0.5 10*3/uL (ref 0.1–1.0)
Monocytes Relative: 10 %
Neutro Abs: 3.7 10*3/uL (ref 1.7–7.7)
Neutrophils Relative %: 70 %
Platelets: 258 10*3/uL (ref 150–400)
RBC: 3.61 MIL/uL — ABNORMAL LOW (ref 3.87–5.11)
RDW: 13.5 % (ref 11.5–15.5)
WBC: 5.2 10*3/uL (ref 4.0–10.5)
nRBC: 0 % (ref 0.0–0.2)

## 2022-08-08 LAB — COMPREHENSIVE METABOLIC PANEL
ALT: 19 U/L (ref 0–44)
AST: 28 U/L (ref 15–41)
Albumin: 3.7 g/dL (ref 3.5–5.0)
Alkaline Phosphatase: 61 U/L (ref 38–126)
Anion gap: 12 (ref 5–15)
BUN: 36 mg/dL — ABNORMAL HIGH (ref 8–23)
CO2: 21 mmol/L — ABNORMAL LOW (ref 22–32)
Calcium: 10.3 mg/dL (ref 8.9–10.3)
Chloride: 106 mmol/L (ref 98–111)
Creatinine, Ser: 1.09 mg/dL — ABNORMAL HIGH (ref 0.44–1.00)
GFR, Estimated: 54 mL/min — ABNORMAL LOW (ref >60)
Glucose, Bld: 181 mg/dL — ABNORMAL HIGH (ref 70–99)
Potassium: 4.3 mmol/L (ref 3.5–5.1)
Sodium: 139 mmol/L (ref 135–145)
Total Bilirubin: 0.2 mg/dL — ABNORMAL LOW (ref 0.3–1.2)
Total Protein: 7 g/dL (ref 6.5–8.1)

## 2022-08-08 MED ORDER — AZELASTINE HCL 0.1 % NA SOLN: 2.0000 | Freq: Every day | NASAL | 0 refills | Status: AC

## 2022-08-08 MED ORDER — SODIUM CHLORIDE 0.9 % IV SOLN
200.0000 mg | Freq: Once | INTRAVENOUS | Status: AC
  Administered 2022-08-08: 200 mg via INTRAVENOUS
  Filled 2022-08-08: qty 8

## 2022-08-08 MED ORDER — HEPARIN SOD (PORK) LOCK FLUSH 100 UNIT/ML IV SOLN
500.0000 [IU] | Freq: Once | INTRAVENOUS | Status: AC | PRN
  Administered 2022-08-08: 500 [IU]
  Filled 2022-08-08: qty 5

## 2022-08-08 MED ORDER — SODIUM CHLORIDE 0.9 % IV SOLN
Freq: Once | INTRAVENOUS | Status: AC
  Filled 2022-08-08: qty 250

## 2022-08-08 MED ORDER — SODIUM CHLORIDE 0.9% FLUSH
10.0000 mL | INTRAVENOUS | Status: DC | PRN
  Administered 2022-08-08: 10 mL via INTRAVENOUS
  Filled 2022-08-08: qty 10

## 2022-08-08 NOTE — Assessment & Plan Note (Signed)
Treatment plan as listed above. 

## 2022-08-08 NOTE — Assessment & Plan Note (Signed)
Follow up with  vascular surgey 

## 2022-08-08 NOTE — Assessment & Plan Note (Addendum)
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 s/p carboplatin and Taxol for 5 cycles, on Keytruda maintenance.  Feb 2024 CT showed NED Labs are reviewed and discussed with patient. Proceed with Keytruda Obtain CT

## 2022-08-08 NOTE — Progress Notes (Addendum)
Hematology/Oncology Progress note [phone] [phone]      CHIEF COMPLAINTS/REASON FOR VISIT:  Follow up for endometrial cancer  ASSESSMENT & PLAN:   Cancer Staging  Endometrial adenocarcinoma [facility]) Staging form: Corpus Uteri - Carcinoma and Carcinosarcoma, AJCC 8th Edition - Clinical stage from 04/10/2021: FIGO Stage III, calculated as Stage Unknown (cT3, cNX, cM0) - Signed by Rickard Patience, MD on 08/24/2021   Endometrial adenocarcinoma (HCC) FIGO grade 3 poorly differentiated endometrial adenocarcinoma. s/p carboplatin and Taxol for 5 cycles, on Keytruda maintenance.  Feb 2024 CT showed NED Labs are reviewed and discussed with patient. 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  FDG avid on PET scan.  Difficult biopsy due to patient's body habitus and deep position of the mass. Resolved on PET scan, suggesting that this lesion might be a metastatic lesion. Continue monitor   Venous insufficiency of both lower extremities Follow up with  vascular surgey  Anemia due to antineoplastic chemotherapy She is currently on immunotherapy.  Hemoglobin improved.   Encounter for antineoplastic immunotherapy Treatment plan as listed above.   Allergy Recommend otc claritin daily.  For nasal symptoms, recommend her to continue flonase nasal spray, recommend trial of azelastine nasal spray     Orders Placed This Encounter  Procedures   CT CHEST ABDOMEN PELVIS WO CONTRAST    Standing Status:   Future    Standing Expiration Date:   08/08/2023    Order Specific Question:   Preferred imaging location?    Answer:   [facility]    Order Specific Question:   If indicated for the ordered procedure, I authorize the administration of oral contrast media per Radiology protocol    Answer:   Yes    Order Specific Question:   Does the patient have a contrast media/X-ray dye allergy?    Answer:   No      Return of visit:  3 weeks Lab MD Greggory Brandy, MD, [name] [facility] Hematology Oncology 08/08/2022     HISTORY OF PRESENTING ILLNESS:   Valerie Case is a  72 y.o.  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 Oncology History  Endometrial adenocarcinoma (HCC)  12/12/2020 Imaging   12/12/2020, CT abdomen pelvis with contrast showed no radiographic evidence of urinary tract neoplasm, calculi, hydronephrosis.  2.6 cm nonspecific left adrenal mass. 02/12/2021 CT hematuria work-up showed small uterine fibroids, no findings to explain hematuria.Hepatomegaly, diverticulosis without evidence of diverticulitis, Coronary artery disease   04/01/2021 -  Hospital Admission   04/01/2021 - 04/03/2021, patient was hospitalized due to symptomatic anemia, hemoglobin 6.9, heavy postmenopausal bleeding with passing large clots.  She also presented with increased creatinine level to 2.58 compared to her baseline level of 0.9 in November 2022.  Patient received IV iron infusion and 2 units of PRBC during the hospital stay..  04/02/2021, iron panel showed iron saturation 37, ferritin 37, TIBC 333-the studies were done after patient received blood transfusion.  At discharge, hemoglobin was 7.7.   04/01/2021 Initial Diagnosis   04/01/2020, endometrial biopsy showed poorly differentiated endometrial adenocarcinoma. Omniseq NGS showed TMB 48.4 mt/mb [high], MSI High, PD-L1-TPS <1%, PIK3CA H1047Q, Negative for BRAF, HER2, NTRK1 RET.     04/10/2021 Cancer Staging   Staging form: Corpus Uteri - Carcinoma and Carcinosarcoma, AJCC 8th Edition - Clinical stage from 04/10/2021: FIGO Stage III, calculated as Stage Unknown (cT3, cNX, cM0) -  Signed by Rickard Patience, MD on 08/24/2021 Stage prefix: Initial diagnosis   04/15/2021 Imaging   PET showed 1. Large hypermetabolic endometrial mass consistent with known endometrial cancer. Suspect direct invasion/involvement  of the right adnexa. Right pelvic sidewall hypermetabolic adenopathy.2. 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 3. No findings for metastatic disease involving the chest or bonystructures.     04/24/2021 - 05/29/2021 Radiation Therapy   status post radiation to pelvis   08/05/2021 Imaging   MRI abdomen w wo contrast 1. Stable solid enhancing 2.7 cm left adrenal lesion, which was hypermetabolic on prior PET/CTs but is unchanged in size dating back to December 12, 2020. 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 However, unfortunately metastatic disease can not be entirely excluded and remains a pertinent differential consideration. Comparison with more remote prior imaging would be the most valuable tool in the assessment of this lesion, as demonstrating long-term stability would indicate this to be a benign lesion. However, if no prior imaging can be made available, would consider follow-up adrenal protocol CT with and without intravenous contrast material in 3 months as this would allow for further assessment of stability as well as enhancement and washout characteristics of the lesion potentially allowing for better characterization versus direct tissue sampling.2. Hepatomegaly and hepatic steatosis.3. Colonic diverticulosis without findings of acute diverticulitis    08/16/2021 - 09/27/2021 Chemotherapy   UTERINE Carboplatin AUC 5 / Paclitaxel q21d x 3      10/03/2021 Imaging   PET 1. No residual hypermetabolism in the uterus suggesting an excellent response to treatment. No findings for metastatic disease. 2. Resolution of hypermetabolism in the left adrenal gland lesion suggesting this was metastatic disease. 3. Diffuse marrow  hypermetabolism likely due to rebound from chemotherapy or marrow stimulating drugs   10/18/2021 - 11/08/2021 Chemotherapy   AUC 5 / Paclitaxel/ Keytruda Q21d  x 2 cycles   11/29/2021 -  Chemotherapy   Patient is on Treatment Plan : UTERINE Pembrolizumab (200) q21d     04/30/2022 Imaging   CT chest abdomen pelvis w contrast  1. Similar to minimal decrease in size of a left adrenal nodule which was felt suspicious for metastatic disease on 10/03/2021 PET. 2. No evidence of new or progressive disease. 3. Coronary artery atherosclerosis. Aortic Atherosclerosis (ICD10-I70.0). 4. Incidental findings, including: Hepatomegaly. Possible constipation.       INTERVAL HISTORY Valerie Case is a 72 y.o. female who has above history reviewed by me today presents for follow up visit for anemia and endometrial cancer Overall she tolerates treatment, Patient denies nausea vomiting diarrhea. + nasal congestion, sneezes, headache of left side. - allergy symptoms. She uses Flonase with no improvement.      Review of Systems  Constitutional:  Positive for fatigue. Negative for chills and fever.  HENT:   Negative for hearing loss and voice change.        Nasal congestion, sneezes  Eyes:  Negative for eye problems.  Respiratory:  Negative for chest tightness and cough.   Cardiovascular:  Negative for chest pain.  Gastrointestinal:  Negative for abdominal distention, abdominal pain and blood in stool.  Endocrine: Negative for hot flashes.  Genitourinary:  Negative for difficulty urinating, frequency and vaginal bleeding.   Musculoskeletal:  Positive for arthralgias.  Skin:  Positive  for rash. Negative for itching.  Neurological:  Positive for headaches. Negative for extremity weakness.  Hematological:  Negative for adenopathy.  Psychiatric/Behavioral:  Negative for confusion.     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    SURGICAL HISTORY: Past Surgical History:  Procedure Laterality Date   CESAREAN SECTION  01/23/1979   IR IMAGING GUIDED PORT INSERTION  08/09/2021   TONSILLECTOMY  03/17/1956    SOCIAL HISTORY: Social History   Socioeconomic History   Marital status: Divorced    Spouse name: Not on file   Number of children: Not on file   Years of education: Not on file   Highest education level: Not on file  Occupational History   Not on file  Tobacco Use   Smoking status: Never   Smokeless tobacco: Never  Vaping Use   Vaping Use: Never used  Substance and Sexual Activity   Alcohol use: Not Currently   Drug use: Never   Sexual activity: Not Currently  Other Topics Concern   Not on file  Social History Narrative      Social Determinants of Health   Financial Resource Strain: Low Risk  (09/27/2021)   Overall Financial Resource Strain (CARDIA)    Difficulty of Paying Living Expenses: Not very hard  Food Insecurity: No Food Insecurity (09/27/2021)   Hunger Vital Sign    Worried About Running Out of Food in the Last Year: Never true    Ran Out of Food in the Last Year: Never true  Transportation Needs: No Transportation Needs (09/27/2021)   PRAPARE - Administrator, Civil Service (Medical): No    Lack of Transportation (Non-Medical): No  Physical Activity: Inactive (09/27/2021)   Exercise Vital Sign    Days of Exercise per Week: 0 days    Minutes of Exercise per Session: 0 min  Stress: Stress Concern Present (09/27/2021)   [employer] of Occupational Health - Occupational Stress Questionnaire    Feeling of Stress : Rather much  Social Connections: Moderately Integrated (09/27/2021)   Social Connection and Isolation Panel [NHANES]    Frequency of Communication with  Friends and Family: Three times a week    Frequency of Social Gatherings with Friends and Family: Three times a week    Attends Religious Services: 1 to 4 times per year    Active Member of Clubs or Organizations: No    Attends Banker Meetings: 1 to 4 times per year    Marital Status: Divorced  Intimate Partner Violence: Not At Risk (09/27/2021)   Humiliation, Afraid, Rape, and Kick questionnaire    Fear of Current or Ex-Partner: No    Emotionally Abused: No    Physically Abused: No    Sexually Abused: No    FAMILY HISTORY: Family History  Problem Relation Age of Onset   Breast cancer Mother 7   Cancer Mother    Cancer Father    Lung cancer Father    Breast cancer Cousin        dx 59s maternal    ALLERGIES:  is allergic to doxycycline and aspirin.  MEDICATIONS:  Current Outpatient Medications  Medication Sig Dispense Refill   acetaminophen (  TYLENOL) 650 MG CR tablet Take 1,300 mg by mouth every 8 (eight) hours as needed for pain.     albuterol (VENTOLIN HFA) 108 (90 Base) MCG/ACT inhaler Inhale 2 puffs into the lungs every 6 (six) hours as needed for wheezing or shortness of breath.     azelastine (ASTELIN) 0.1 % nasal spray Place 2 sprays into both nostrils daily. Use in each nostril as directed 30 mL 0   Cranberry-Vitamin C-Probiotic (AZO CRANBERRY PO) Take by mouth.     docusate sodium (COLACE) 100 MG capsule Take 1 capsule (100 mg total) by mouth 2 (two) times daily. 60 capsule 1   empagliflozin (JARDIANCE) 25 MG TABS tablet Take 25 mg by mouth daily.     ferrous sulfate 325 (65 FE) MG EC tablet Take 325 mg by mouth daily with breakfast.     FLUoxetine (PROZAC) 10 MG capsule Take 10 mg by mouth daily.     fluticasone (FLONASE) 50 MCG/ACT nasal spray Place 1 spray into both nostrils daily as needed for allergies or rhinitis.     hydrochlorothiazide (HYDRODIURIL) 12.5 MG tablet Take 12.5 mg by mouth daily.     hydrocortisone 2.5 % cream Apply topically 3  (three) times daily. 30 g 2   ibuprofen (ADVIL) 200 MG tablet Take 400 mg by mouth every 8 (eight) hours as needed for moderate pain.     lidocaine-prilocaine (EMLA) cream Apply to affected area once 30 g 3   lisinopril (ZESTRIL) 40 MG tablet Take 40 mg by mouth at bedtime.     LORazepam (ATIVAN) 0.5 MG tablet Take 1 tablet (0.5 mg total) by mouth every 8 (eight) hours as needed for anxiety (nausea vomiting). 30 tablet 0   metFORMIN (GLUCOPHAGE-XR) 500 MG 24 hr tablet Take 1,000 mg by mouth at bedtime.     metoprolol succinate (TOPROL-XL) 25 MG 24 hr tablet Take 25 mg by mouth daily.     mupirocin ointment (BACTROBAN) 2 % Apply 1 Application topically 3 (three) times daily. 22 g 0   NOVOLIN 70/30 FLEXPEN (70-30) 100 UNIT/ML KwikPen 50-110 Units See admin instructions. 50 units in the morning, 110 units at bedtime     ondansetron (ZOFRAN) 8 MG tablet Take 1 tablet (8 mg total) by mouth 2 (two) times daily as needed. Start on the third day after chemotherapy. 30 tablet 1   OZEMPIC, 1 MG/DOSE, 4 MG/3ML SOPN Inject 1 mg into the skin every Tuesday.     potassium chloride SA (KLOR-CON M) 20 MEQ tablet Take 1 tablet (20 mEq total) by mouth daily. 3 tablet 0   prochlorperazine (COMPAZINE) 10 MG tablet Take 1 tablet (10 mg total) by mouth every 6 (six) hours as needed (Nausea or vomiting). 30 tablet 1   rosuvastatin (CRESTOR) 10 MG tablet Take 10 mg by mouth daily.     traMADol (ULTRAM) 50 MG tablet Take 2 tablets (100 mg total) by mouth every 6 (six) hours as needed. 90 tablet 0   triamcinolone ointment (KENALOG) 0.5 % Apply 1 Application topically 2 (two) times daily. 30 g 0   No current facility-administered medications for this visit.   Facility-Administered Medications Ordered in Other Visits  Medication Dose Route Frequency Provider Last Rate Last Admin   heparin lock flush 100 UNIT/ML injection            heparin lock flush 100 unit/mL  500 Units Intracatheter Once PRN Rickard Patience, MD        pembrolizumab [facility]) 200 mg in  sodium chloride 0.9 % 50 mL chemo infusion  200 mg Intravenous Once Rickard Patience, MD         PHYSICAL EXAMINATION: ECOG PERFORMANCE STATUS: 1 - Symptomatic but completely ambulatory  Physical Exam Constitutional:      General: She is not in acute distress.    Appearance: She is obese.  HENT:     Head: Normocephalic and atraumatic.  Eyes:     General: No scleral icterus. Cardiovascular:     Rate and Rhythm: Normal rate.  Pulmonary:     Effort: Pulmonary effort is normal. No respiratory distress.     Breath sounds: No wheezing.  Abdominal:     General: Bowel sounds are normal. There is no distension.     Palpations: Abdomen is soft.  Musculoskeletal:        General: No deformity. Normal range of motion.     Cervical back: Normal range of motion.     Right lower leg: Edema present.     Left lower leg: Edema present.     Comments:    Skin:    General: Skin is warm and dry.     Findings: Rash present.     Comments: Chronic venous sufficiency skin changes  Neurological:     Mental Status: She is alert and oriented to person, place, and time. Mental status is at baseline.     Cranial Nerves: No cranial nerve deficit.     Coordination: Coordination normal.  Psychiatric:        Mood and Affect: Mood normal.    LABORATORY DATA:  I have reviewed the data as listed    Latest Ref Rng & Units 08/08/2022    9:50 AM 07/18/2022    9:52 AM 07/02/2022   12:59 PM  CBC  WBC 4.0 - 10.5 K/uL 5.2  5.6  4.8   Hemoglobin 12.0 - 15.0 g/dL 16.1  09.6  04.5   Hematocrit 36.0 - 46.0 % 34.5  33.4  36.7   Platelets 150 - 400 K/uL 258  255  258       Latest Ref Rng & Units 08/08/2022    9:50 AM 07/18/2022    9:52 AM 07/02/2022   12:59 PM  CMP  Glucose 70 - 99 mg/dL 409  99  811   BUN 8 - 23 mg/dL 36  44  34   Creatinine 0.44 - 1.00 mg/dL 9.14  7.82  9.56   Sodium 135 - 145 mmol/L 139  139  138   Potassium 3.5 - 5.1 mmol/L 4.3  4.6  4.4   Chloride 98 - 111  mmol/L 106  111  109   CO2 22 - 32 mmol/L 21  20  20    Calcium 8.9 - 10.3 mg/dL 21.3  08.6  9.6   Total Protein 6.5 - 8.1 g/dL 7.0  6.8  7.3   Total Bilirubin 0.3 - 1.2 mg/dL 0.2  0.2  0.3   Alkaline Phos 38 - 126 U/L 61  64  75   AST 15 - 41 U/L 28  18  24    ALT 0 - 44 U/L 19  14  17        RADIOGRAPHIC STUDIES: I have personally reviewed the radiological images as listed and agreed with the findings in the report. VAS [place] LOWER EXTREMITY VENOUS REFLUX  Result Date: 07/07/2022  Lower Venous Reflux Study Patient Name:  Valerie Case  Date of Exam:   07/04/2022 Medical Rec #: [id number]  Accession #:    [id number] Date of Birth: 12-05-1950           Patient Gender: F Patient Age:   50 years Exam Location:  [facility] Procedure:      VAS [place] LOWER EXTREMITY VENOUS REFLUX Referring Phys: Sheppard Plumber --------------------------------------------------------------------------------  Indications: Swelling, and Pain.  Performing Technologist: Salvadore Farber RVT  Examination Guidelines: A complete evaluation includes B-mode imaging, spectral Doppler, color Doppler, and power Doppler as needed of all accessible portions of each vessel. Bilateral testing is considered an integral part of a complete examination. Limited examinations for reoccurring indications may be performed as noted. The reflux portion of the exam is performed with the patient in reverse Trendelenburg. Significant venous reflux is defined as >500 ms in the superficial venous system, and >1 second in the deep venous system.   Summary: Bilateral: - No evidence of deep vein thrombosis seen in the lower extremities, bilaterally, from the common femoral through the popliteal veins. - No evidence of superficial venous thrombosis in the lower extremities, bilaterally. - No evidence of deep venous insufficiency seen bilaterally in the lower extremity. - No evidence of superficial venous reflux seen in the greater saphenous veins  bilaterally. - No evidence of superficial venous reflux seen in the short saphenous veins bilaterally.  *See table(s) above for measurements and observations. Electronically signed by Festus Barren MD on 07/07/2022 at 7:27:20 AM.    Final

## 2022-08-08 NOTE — Assessment & Plan Note (Signed)
Recommend otc claritin daily.  For nasal symptoms, recommend her to continue flonase nasal spray, recommend trial of azelastine nasal spray

## 2022-08-08 NOTE — Assessment & Plan Note (Signed)
lipid poor hyperfunctioning adenoma vs mets.  FDG avid on PET scan.  Difficult biopsy due to patient's body habitus and deep position of the mass. Resolved on PET scan, suggesting that this lesion might be a metastatic lesion. Continue monitor  

## 2022-08-08 NOTE — Assessment & Plan Note (Signed)
She is currently on immunotherapy.  Hemoglobin improved.  

## 2022-08-08 NOTE — Patient Instructions (Signed)
[facility]  Discharge Instructions: Thank you for choosing [facility] to provide your oncology and hematology care.  If you have a lab appointment with the Cancer Center, please go directly to the Cancer Center and check in at the registration area.  Wear comfortable clothing and clothing appropriate for easy access to any Portacath or PICC line.   We strive to give you quality time with your provider. You may need to reschedule your appointment if you arrive late (15 or more minutes).  Arriving late affects you and other patients whose appointments are after yours.  Also, if you miss three or more appointments without notifying the office, you may be dismissed from the clinic at the provider's discretion.      For prescription refill requests, have your pharmacy contact our office and allow 72 hours for refills to be completed.    Today you received the following chemotherapy and/or immunotherapy agents- Keytruda      To help prevent nausea and vomiting after your treatment, we encourage you to take your nausea medication as directed.  BELOW ARE SYMPTOMS THAT SHOULD BE REPORTED IMMEDIATELY: *FEVER GREATER THAN 100.4 F (38 C) OR HIGHER *CHILLS OR SWEATING *NAUSEA AND VOMITING THAT IS NOT CONTROLLED WITH YOUR NAUSEA MEDICATION *UNUSUAL SHORTNESS OF BREATH *UNUSUAL BRUISING OR BLEEDING *URINARY PROBLEMS (pain or burning when urinating, or frequent urination) *BOWEL PROBLEMS (unusual diarrhea, constipation, pain near the anus) TENDERNESS IN MOUTH AND THROAT WITH OR WITHOUT PRESENCE OF ULCERS (sore throat, sores in mouth, or a toothache) UNUSUAL RASH, SWELLING OR PAIN  UNUSUAL VAGINAL DISCHARGE OR ITCHING   Items with * indicate a potential emergency and should be followed up as soon as possible or go to the Emergency Department if any problems should occur.  Please show the CHEMOTHERAPY ALERT CARD or IMMUNOTHERAPY ALERT CARD at check-in to  the Emergency Department and triage nurse.  Should you have questions after your visit or need to cancel or reschedule your appointment, please contact [facility] [place] [facility]  [phone] and follow the prompts.  Office hours are 8:00 a.m. to 4:30 p.m. Monday - Friday. Please note that voicemails left after 4:00 p.m. may not be returned until the following business day.  We are closed weekends and major holidays. You have access to a nurse at all times for urgent questions. Please call the main number to the clinic [phone] and follow the prompts.  For any non-urgent questions, you may also contact your provider using MyChart. We now offer e-Visits for anyone 18 and older to request care online for non-urgent symptoms. For details visit [email or web address].   Also download the MyChart app! Go to the app store, search "MyChart", open the app, select [facility], and log in with your MyChart username and password.   

## 2022-08-13 ENCOUNTER — Encounter: Payer: Self-pay | Admitting: Obstetrics and Gynecology

## 2022-08-13 ENCOUNTER — Inpatient Hospital Stay (HOSPITAL_BASED_OUTPATIENT_CLINIC_OR_DEPARTMENT_OTHER): Payer: Medicare PPO | Admitting: Obstetrics and Gynecology

## 2022-08-13 VITALS — BP 135/72 | HR 98 | Temp 96.6°F | Resp 19 | Wt 280.8 lb

## 2022-08-13 DIAGNOSIS — C541 Malignant neoplasm of endometrium: Secondary | ICD-10-CM

## 2022-08-13 DIAGNOSIS — Z5112 Encounter for antineoplastic immunotherapy: Secondary | ICD-10-CM | POA: Diagnosis not present

## 2022-08-13 NOTE — Progress Notes (Signed)
Gynecologic Oncology Interval Visit   Referring Provider: Dr. Jean Rosenthal  Chief Complaint: FIGO grade III endometrial adenocarcinoma  Subjective:  Valerie Case is a 72 y.o. female who is seen in consultation from Dr. Jean Rosenthal for poorly differentiated endometrial adenocarcinoma, FIGO grade III, s/p radiation x 5 weeks to control bleeding, felt to be poor surgical candidate, received chemotherapy who presents to clinic for follow up.   She is currently on maintenance pembrolizumab.  She saw Dr. Cathie Hoops on 08/08/2022. Her last imaging study 04/30/22 was very reassuring.  She has a CT scan scheduled for 08/22/2022.   Gynecologic Oncology History Valerie Case is a pleasant female who is seen in consultation from Dr. Jean Rosenthal for poorly differentiated endometrial adenocarcinoma, figo grade III. Please see prior notes for complete detail  04/01/21 She was admitted  to [facility] with vaginal bleeding and received 2 units pRBCs and iron infusion. She underwent endometrial biopsy on which revealed poorly differentiated endometrial adenocarcinoma FIGO III  04/02/2021 BILATERAL LOWER EXTREMITY VENOUS DOPPLER ULTRASOUND No evidence of deep venous thrombosis in either lower extremity.  04/16/2021 PET IMPRESSION: 1. Large hypermetabolic endometrial mass consistent with known endometrial cancer. Suspect direct invasion/involvement of the right adnexa. Right pelvic sidewall hypermetabolic adenopathy. 2. 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 3. No findings for metastatic disease involving the chest or bony structures.  04/24/2021 Tumor Board Documentation Patient to undergo radiation therapy. Plan to reassess after radiation with imaging to re-evaluate possible surgical intervention.     04/24/2021 - 05/29/2021: She completed WPRT Dose/Fx (Gy): 1.8 #Fx: 25 / 25. Total Dose (Gy): 45  06/12/2021 PET IMPRESSION: 1.  Substantial reduction in size and activity of the uterine mass. Substantial reduction in size and activity of the right pelvic sidewall lymph node. 2. The moderately hypermetabolic left adrenal mass is stable in size back through earliest available compare comparison of 12/12/2020, with roughly similar activity level to previous. The density characteristics are not specific for adrenal adenoma. Possibilities include benign and malignant adrenal neoplasms versus metastatic lesion. 3. Other imaging findings of potential clinical significance: Left mastoid effusion. Coronary and aortic atherosclerosis. Systemic atherosclerosis. Mild cardiomegaly. Lax anterior abdominal wall. Suspected cholelithiasis. Sigmoid colon diverticulosis. Congenital anomalies of the C1 vertebra. Anterolisthesis at L4-5.  Hypermetabolic left adrenal mass has been worked up for possible pheochromocytoma which was negative. She was referred to urology and saw Dr. Lonna Cobb who recommended CT guided biopsy but this was refused by IR.   She has been followed by Dr. Cathie Hoops for iron deficiency anemia secondary to blood loss in setting of endometrial cancer. She received IV iron and palliative radiation. Bleeding has stopped and hemoglobin has improved to 11.4. Blood sugars have been elevated and last HmgA1c was 7.9 (07/18/21)  08/05/2021 Imaging    MRI abdomen w wo contrast 1. Stable solid enhancing 2.7 cm left adrenal lesion, which was hypermetabolic on prior PET/CTs but is unchanged in size dating back to December 12, 2020. 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 However, unfortunately metastatic disease can not be entirely excluded and remains a pertinent differential consideration. Comparison with more remote prior imaging would be the most valuable tool in the assessment of  this lesion, as demonstrating long-term stability would indicate this to be a benign lesion. However, if no prior imaging  can be made available, would consider follow-up adrenal protocol CT with and without intravenous contrast material in 3 months as this would allow for further assessment of stability as well as enhancement and washout characteristics of the lesion potentially allowing for better characterization versus direct tissue sampling.2. Hepatomegaly and hepatic steatosis.3. Colonic diverticulosis without findings of acute diverticulitis      Her case was discussed at Tumor Board. Dr. Johnnette Litter felt like she would be a candidate for surgery. After further discussion she opted for non-surgical management.    08/16/2021 - 09/27/2021  completed  3 cycles of carbo-paclitaxel chemotherapy.   PET 10/03/21  - no residual hypermetabolism of the uterus suggestive of excellent response to treatment. No findings of metastatic disease. Resolution of hypermetabolism of left adrenal lesions. Diffuse marrow hypermetabolism likely secondary to udenyca/GCSF vs chemotherapy.   10/18/2021 - 11/08/2021 completed  2 cycles of carbo-paclitaxel chemotherapy.  11/29/2021 started on pembrolizumab 200 mg every 3 weeks.   01/22/2022 CT C/A/P IMPRESSION: 1. No findings of progressive metastatic disease within the chest, abdomen, or pelvis. 2. Left adrenal nodule is similar in size to 10/03/2021 PET. This was felt to be suspicious for treated metastasis on prior PET. 3. Hepatomegaly 4. Coronary artery atherosclerosis. Aortic Atherosclerosis (ICD10-I70.0). 5.  Tiny hiatal hernia.   04/30/2022 Imaging  CT C/A/P    CT chest abdomen pelvis w contrast  1. Similar to minimal decrease in size of a left adrenal nodule which was felt suspicious for metastatic disease on 10/03/2021 PET. 2. No evidence of new or progressive disease. 3. Coronary artery atherosclerosis. Aortic Atherosclerosis (ICD10-I70.0). 4. Incidental  findings, including: Hepatomegaly. Possible constipation.    GENETIC TESTING: Genetic counseling performed, patient declined testing  Tumor markers 08/20/21- Omnise[employer] NGS showed TMB 48.4 mt/mb [high], MSI High, PD-L1-TPS <1%, PIK3CA H1047Q, Negative for BRAF, HER2, NTRK1 RET. MMR was not reported.   .        Problem List: Patient Active Problem List   Diagnosis Date Noted   Allergy 08/08/2022   Skin rash 06/06/2022   Encounter for antineoplastic immunotherapy 12/20/2021   Encounter for antineoplastic chemotherapy 08/24/2021   Morbid obesity with body mass index (BMI) of 40.0 to 44.9 in adult [facility]) 08/24/2021   Anemia due to antineoplastic chemotherapy 08/24/2021   Drug-induced constipation 08/23/2021   Port-A-Cath in place 08/15/2021   Venous insufficiency of both lower extremities 05/20/2021   Hepatomegaly 04/14/2021   Endometrial adenocarcinoma (HCC) 04/14/2021   Adrenal mass (HCC) 04/14/2021   Goals of care, counseling/discussion 04/14/2021   B12 deficiency 04/11/2021   AKI (acute kidney injury) (HCC) 04/01/2021   Essential hypertension 04/01/2021   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   Depression 04/01/2021   Recent unintentional weight loss over several months 04/01/2021   History of asthma 04/01/2021   Bilateral lower extremity edema 04/01/2021     Past Medical History: Past Medical History:  Diagnosis Date   Asthma    Cancer (HCC)    Depression 04/01/2021   Diabetes mellitus without complication (HCC)    Essential hypertension 04/01/2021   Hx of dysplastic nevus 07/16/2010   RLQA   Hypertension    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    Past Surgical History: Past Surgical History:  Procedure Laterality Date   CESAREAN SECTION  01/23/1979   IR IMAGING GUIDED PORT INSERTION  08/09/2021   TONSILLECTOMY  03/17/1956    Past Gynecologic History:  Post menopausal - 2006 STD: denies   OB History:  OB  History  Gravida Para Term Preterm AB Living  1 1 1     1   SAB IAB Ectopic Multiple Live Births          1    # Outcome Date GA Lbr Len/2nd Weight Sex Delivery Anes PTL Lv  1 Term 1980     CS-Unspec   LIV    Family History: Family History  Problem Relation Age of Onset   Breast cancer Mother 54   Cancer Mother    Cancer Father    Lung cancer Father    Breast cancer Cousin        dx 76s maternal    Social History: Social History   Socioeconomic History   Marital status: Divorced    Spouse name: Not on file   Number of children: Not on file   Years of education: Not on file   Highest education level: Not on file  Occupational History   Not on file  Tobacco Use   Smoking status: Never   Smokeless tobacco: Never  Vaping Use   Vaping Use: Never used  Substance and Sexual Activity   Alcohol use: Not Currently   Drug use: Never   Sexual activity: Not Currently  Other Topics Concern   Not on file  Social History Narrative      Social Determinants of Health   Financial Resource Strain: Low Risk  (09/27/2021)   Overall Financial Resource Strain (CARDIA)    Difficulty of Paying Living Expenses: Not very hard  Food Insecurity: No Food Insecurity (09/27/2021)   Hunger Vital Sign    Worried About Running Out of Food in the Last Year: Never true    Ran Out of Food in the Last Year: Never true  Transportation Needs: No Transportation Needs (09/27/2021)   PRAPARE - Administrator, Civil Service (Medical): No    Lack of Transportation (Non-Medical): No  Physical Activity: Inactive (09/27/2021)   Exercise Vital Sign    Days of Exercise per Week: 0 days    Minutes of Exercise per Session: 0 min  Stress: Stress Concern Present (09/27/2021)   [employer] of Occupational Health - Occupational Stress Questionnaire    Feeling of Stress : Rather much  Social Connections: Moderately Integrated (09/27/2021)   Social Connection and Isolation Panel [NHANES]     Frequency of Communication with Friends and Family: Three times a week    Frequency of Social Gatherings with Friends and Family: Three times a week    Attends Religious Services: 1 to 4 times per year    Active Member of Clubs or Organizations: No    Attends Banker Meetings: 1 to 4 times per year    Marital Status: Divorced  Intimate Partner Violence: Not At Risk (09/27/2021)   Humiliation, Afraid, Rape, and Kick questionnaire    Fear of Current or Ex-Partner: No    Emotionally Abused: No    Physically Abused: No    Sexually Abused: No    Allergies: Allergies  Allergen Reactions   Doxycycline Hives    Thrush and mouth sores   Aspirin Rash    Childhood reaction     Current Medications: Current Outpatient Medications  Medication Sig Dispense Refill   acetaminophen (TYLENOL) 650 MG CR tablet Take 1,300 mg by mouth every 8 (eight) hours as needed for pain.     albuterol (VENTOLIN HFA) 108 (90 Base) MCG/ACT inhaler Inhale 2 puffs into the lungs every 6 (six) hours  as needed for wheezing or shortness of breath.     azelastine (ASTELIN) 0.1 % nasal spray Place 2 sprays into both nostrils daily. Use in each nostril as directed 30 mL 0   Cranberry-Vitamin C-Probiotic (AZO CRANBERRY PO) Take by mouth.     docusate sodium (COLACE) 100 MG capsule Take 1 capsule (100 mg total) by mouth 2 (two) times daily. 60 capsule 1   empagliflozin (JARDIANCE) 25 MG TABS tablet Take 25 mg by mouth daily.     ferrous sulfate 325 (65 FE) MG EC tablet Take 325 mg by mouth daily with breakfast.     FLUoxetine (PROZAC) 10 MG capsule Take 10 mg by mouth daily.     fluticasone (FLONASE) 50 MCG/ACT nasal spray Place 1 spray into both nostrils daily as needed for allergies or rhinitis.     hydrochlorothiazide (HYDRODIURIL) 12.5 MG tablet Take 12.5 mg by mouth daily.     hydrocortisone 2.5 % cream Apply topically 3 (three) times daily. 30 g 2   ibuprofen (ADVIL) 200 MG tablet Take 400 mg by mouth every  8 (eight) hours as needed for moderate pain.     lidocaine-prilocaine (EMLA) cream Apply to affected area once 30 g 3   lisinopril (ZESTRIL) 40 MG tablet Take 40 mg by mouth at bedtime.     LORazepam (ATIVAN) 0.5 MG tablet Take 1 tablet (0.5 mg total) by mouth every 8 (eight) hours as needed for anxiety (nausea vomiting). 30 tablet 0   metFORMIN (GLUCOPHAGE-XR) 500 MG 24 hr tablet Take 1,000 mg by mouth at bedtime.     metoprolol succinate (TOPROL-XL) 25 MG 24 hr tablet Take 25 mg by mouth daily.     mupirocin ointment (BACTROBAN) 2 % Apply 1 Application topically 3 (three) times daily. 22 g 0   NOVOLIN 70/30 FLEXPEN (70-30) 100 UNIT/ML KwikPen 50-110 Units See admin instructions. 50 units in the morning, 110 units at bedtime     ondansetron (ZOFRAN) 8 MG tablet Take 1 tablet (8 mg total) by mouth 2 (two) times daily as needed. Start on the third day after chemotherapy. 30 tablet 1   OZEMPIC, 1 MG/DOSE, 4 MG/3ML SOPN Inject 1 mg into the skin every Tuesday.     potassium chloride SA (KLOR-CON M) 20 MEQ tablet Take 1 tablet (20 mEq total) by mouth daily. 3 tablet 0   prochlorperazine (COMPAZINE) 10 MG tablet Take 1 tablet (10 mg total) by mouth every 6 (six) hours as needed (Nausea or vomiting). 30 tablet 1   rosuvastatin (CRESTOR) 10 MG tablet Take 10 mg by mouth daily.     traMADol (ULTRAM) 50 MG tablet Take 2 tablets (100 mg total) by mouth every 6 (six) hours as needed. 90 tablet 0   triamcinolone ointment (KENALOG) 0.5 % Apply 1 Application topically 2 (two) times daily. 30 g 0   No current facility-administered medications for this visit.   Facility-Administered Medications Ordered in Other Visits  Medication Dose Route Frequency Provider Last Rate Last Admin   heparin lock flush 100 UNIT/ML injection             Review of Systems General:  no complaints Skin: no complaints Eyes: no complaints HEENT: no complaints Breasts: no complaints Pulmonary: no complaints Cardiac: no  complaints Gastrointestinal: no complaints Genitourinary/Sexual: no complaints Ob/Gyn: no complaints Musculoskeletal: no complaints Hematology: no complaints Neurologic/Psych: no complaints    Objective:  Physical Examination:  BP 135/72   Pulse 98   Temp (!) 96.6 F (35.9  C)   Resp 19   Wt 280 lb 12.8 oz (127.4 kg)   SpO2 97%   BMI 42.70 kg/m    Performance status = 1  GENERAL: Patient is a well appearing female in no acute distress ABDOMEN:  Soft, nontender. Protuberant but not distended. No ascites or masses.   Skin: the skin lower abdomen with only mild erythema in two small areas.   EXTREMITIES:  No abnormal assymetrical edema.   NEURO:  Nonfocal. Well oriented.  Appropriate affect.  Pelvic: EGBUS: no lesions. No evidence of infection Cervix: soft and nontender to palpation. For adequate visualization need longer and Pederson speculum Vagina: no lesions, no discharge or bleeding Uterus: unable to palpate Adnexa: no palpable masses but unable to palpate   Lab Review No labs on site today  Radiologic Imaging: Per HPI   Assessment:  Valerie Case is a 72 y.o. female diagnosed with metastatic poorly differentiated endometrial adenocarcinoma (MSI-H) with right sidewall and pelvic lymph node involvement s/p palliative WPRT d/t bleeding, with evidence of completed response based on imaging, and doing well on maintenance pembrolizumab.    Adrenal mass, uncertain etiology. Pheocromocytoma workup was negative. Biopsy recommended by urology but denied by IR. Radiology recommended adrenal MRI. Stable findings. DDX: lipid poor adenoma vs metastatic disease  H/o Renal insufficiency (2.6 on 04/01/2021) improved  Medical co-morbidities complicating care: HTN, AODM, depression, morbid obesity, Chronic venous insufficiency of both lower extremities with h/o cellulitis, History of asthma. Body mass index is 42.7 kg/m.  Plan:   Problem List Items Addressed This Visit        Genitourinary   Endometrial adenocarcinoma (HCC) - Primary (Chronic)   Excellent response to chemotherapy followed by maintenance immunotherapy.  Continue to follow up with Dr. Cathie Hoops. Repeat imaging pending.   She has a much improved performance status now compared to initial presentation. If she does have local recurrence surgery could be considered.   Germline genetic testing declined. May also consider in the future but prefers to hold off for now. She met with Lacy Duverney 12/04/21.   Continue to follow up with pcp to update her routine cancer screenings if repeat imaging negative.    Follow-up in 3 months for continued surveillance. Patient will see Dr Sonia Side for next visit. Prefers female providers.   The patient's diagnosis, an outline of the further diagnostic and laboratory studies which will be required, the recommendation, and alternatives were discussed.  All questions were answered to the patient's satisfaction.   Samari Gorby, [place] Alvarez,*MD

## 2022-08-22 ENCOUNTER — Ambulatory Visit
Admission: RE | Admit: 2022-08-22 | Discharge: 2022-08-22 | Disposition: A | Discharge status: Home / Self Care | Payer: Medicare PPO | Source: Ambulatory Visit | Attending: Oncology | Admitting: Oncology

## 2022-08-22 DIAGNOSIS — C541 Malignant neoplasm of endometrium: Secondary | ICD-10-CM | POA: Insufficient documentation

## 2022-08-29 ENCOUNTER — Inpatient Hospital Stay: Payer: Medicare PPO

## 2022-08-29 ENCOUNTER — Inpatient Hospital Stay: Payer: Medicare PPO | Attending: Obstetrics and Gynecology

## 2022-08-29 ENCOUNTER — Encounter: Payer: Self-pay | Admitting: Oncology

## 2022-08-29 ENCOUNTER — Inpatient Hospital Stay: Payer: Medicare PPO | Admitting: Oncology

## 2022-08-29 VITALS — BP 158/61 | HR 103 | Temp 97.4°F | Resp 18 | Wt 282.5 lb

## 2022-08-29 DIAGNOSIS — C541 Malignant neoplasm of endometrium: Secondary | ICD-10-CM | POA: Insufficient documentation

## 2022-08-29 DIAGNOSIS — D631 Anemia in chronic kidney disease: Secondary | ICD-10-CM | POA: Insufficient documentation

## 2022-08-29 DIAGNOSIS — Z809 Family history of malignant neoplasm, unspecified: Secondary | ICD-10-CM | POA: Diagnosis not present

## 2022-08-29 DIAGNOSIS — N189 Chronic kidney disease, unspecified: Secondary | ICD-10-CM

## 2022-08-29 DIAGNOSIS — R160 Hepatomegaly, not elsewhere classified: Secondary | ICD-10-CM | POA: Insufficient documentation

## 2022-08-29 DIAGNOSIS — Z79620 Long term (current) use of immunosuppressive biologic: Secondary | ICD-10-CM | POA: Insufficient documentation

## 2022-08-29 DIAGNOSIS — Z881 Allergy status to other antibiotic agents status: Secondary | ICD-10-CM | POA: Diagnosis not present

## 2022-08-29 DIAGNOSIS — E278 Other specified disorders of adrenal gland: Secondary | ICD-10-CM | POA: Insufficient documentation

## 2022-08-29 DIAGNOSIS — Z801 Family history of malignant neoplasm of trachea, bronchus and lung: Secondary | ICD-10-CM | POA: Insufficient documentation

## 2022-08-29 DIAGNOSIS — Z803 Family history of malignant neoplasm of breast: Secondary | ICD-10-CM | POA: Insufficient documentation

## 2022-08-29 DIAGNOSIS — E669 Obesity, unspecified: Secondary | ICD-10-CM | POA: Diagnosis not present

## 2022-08-29 DIAGNOSIS — R5383 Other fatigue: Secondary | ICD-10-CM | POA: Diagnosis not present

## 2022-08-29 DIAGNOSIS — I129 Hypertensive chronic kidney disease with stage 1 through stage 4 chronic kidney disease, or unspecified chronic kidney disease: Secondary | ICD-10-CM | POA: Diagnosis not present

## 2022-08-29 DIAGNOSIS — Z79899 Other long term (current) drug therapy: Secondary | ICD-10-CM | POA: Diagnosis not present

## 2022-08-29 DIAGNOSIS — M2550 Pain in unspecified joint: Secondary | ICD-10-CM | POA: Insufficient documentation

## 2022-08-29 DIAGNOSIS — I700 Atherosclerosis of aorta: Secondary | ICD-10-CM | POA: Diagnosis not present

## 2022-08-29 DIAGNOSIS — I872 Venous insufficiency (chronic) (peripheral): Secondary | ICD-10-CM | POA: Diagnosis not present

## 2022-08-29 DIAGNOSIS — T7840XA Allergy, unspecified, initial encounter: Secondary | ICD-10-CM

## 2022-08-29 DIAGNOSIS — Z86018 Personal history of other benign neoplasm: Secondary | ICD-10-CM | POA: Diagnosis not present

## 2022-08-29 DIAGNOSIS — Z923 Personal history of irradiation: Secondary | ICD-10-CM | POA: Insufficient documentation

## 2022-08-29 DIAGNOSIS — R21 Rash and other nonspecific skin eruption: Secondary | ICD-10-CM | POA: Diagnosis not present

## 2022-08-29 DIAGNOSIS — Z5112 Encounter for antineoplastic immunotherapy: Secondary | ICD-10-CM | POA: Diagnosis present

## 2022-08-29 DIAGNOSIS — Z886 Allergy status to analgesic agent status: Secondary | ICD-10-CM | POA: Insufficient documentation

## 2022-08-29 DIAGNOSIS — I2510 Atherosclerotic heart disease of native coronary artery without angina pectoris: Secondary | ICD-10-CM | POA: Insufficient documentation

## 2022-08-29 LAB — CBC WITH DIFFERENTIAL/PLATELET
Abs Immature Granulocytes: 0.05 10*3/uL (ref 0.00–0.07)
Basophils Absolute: 0 10*3/uL (ref 0.0–0.1)
Basophils Relative: 1 %
Eosinophils Absolute: 0.5 10*3/uL (ref 0.0–0.5)
Eosinophils Relative: 6 %
HCT: 36.1 % (ref 36.0–46.0)
Hemoglobin: 11.6 g/dL — ABNORMAL LOW (ref 12.0–15.0)
Immature Granulocytes: 1 %
Lymphocytes Relative: 17 %
Lymphs Abs: 1.5 10*3/uL (ref 0.7–4.0)
MCH: 30.6 pg (ref 26.0–34.0)
MCHC: 32.1 g/dL (ref 30.0–36.0)
MCV: 95.3 fL (ref 80.0–100.0)
Monocytes Absolute: 0.9 10*3/uL (ref 0.1–1.0)
Monocytes Relative: 10 %
Neutro Abs: 5.9 10*3/uL (ref 1.7–7.7)
Neutrophils Relative %: 65 %
Platelets: 297 10*3/uL (ref 150–400)
RBC: 3.79 MIL/uL — ABNORMAL LOW (ref 3.87–5.11)
RDW: 14 % (ref 11.5–15.5)
WBC: 8.8 10*3/uL (ref 4.0–10.5)
nRBC: 0 % (ref 0.0–0.2)

## 2022-08-29 LAB — COMPREHENSIVE METABOLIC PANEL
ALT: 16 U/L (ref 0–44)
AST: 20 U/L (ref 15–41)
Albumin: 3.8 g/dL (ref 3.5–5.0)
Alkaline Phosphatase: 66 U/L (ref 38–126)
Anion gap: 11 (ref 5–15)
BUN: 35 mg/dL — ABNORMAL HIGH (ref 8–23)
CO2: 20 mmol/L — ABNORMAL LOW (ref 22–32)
Calcium: 9.7 mg/dL (ref 8.9–10.3)
Chloride: 107 mmol/L (ref 98–111)
Creatinine, Ser: 1.07 mg/dL — ABNORMAL HIGH (ref 0.44–1.00)
GFR, Estimated: 56 mL/min — ABNORMAL LOW (ref >60)
Glucose, Bld: 139 mg/dL — ABNORMAL HIGH (ref 70–99)
Potassium: 4.2 mmol/L (ref 3.5–5.1)
Sodium: 138 mmol/L (ref 135–145)
Total Bilirubin: 0.4 mg/dL (ref 0.3–1.2)
Total Protein: 7.3 g/dL (ref 6.5–8.1)

## 2022-08-29 MED ORDER — SODIUM CHLORIDE 0.9 % IV SOLN
Freq: Once | INTRAVENOUS | Status: AC
  Filled 2022-08-29: qty 250

## 2022-08-29 MED ORDER — HEPARIN SOD (PORK) LOCK FLUSH 100 UNIT/ML IV SOLN
500.0000 [IU] | Freq: Once | INTRAVENOUS | Status: DC | PRN
  Filled 2022-08-29: qty 5

## 2022-08-29 MED ORDER — SODIUM CHLORIDE 0.9 % IV SOLN
200.0000 mg | Freq: Once | INTRAVENOUS | Status: AC
  Administered 2022-08-29: 200 mg via INTRAVENOUS
  Filled 2022-08-29: qty 8

## 2022-08-29 NOTE — Assessment & Plan Note (Addendum)
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 s/p carboplatin and Taxol for 5 cycles, on Keytruda maintenance.  Labs are reviewed and discussed with patient. Proceed with Keytruda CT showed NED/stable disease, discussed with platelet.

## 2022-08-29 NOTE — Assessment & Plan Note (Signed)
lipid poor hyperfunctioning adenoma vs mets.  FDG avid on PET scan.  Difficult biopsy due to patient's body habitus and deep position of the mass. Resolved on PET scan, suggesting that this lesion might be a metastatic lesion. Continue monitor  

## 2022-08-29 NOTE — Patient Instructions (Signed)
[facility]  Discharge Instructions: Thank you for choosing [facility] to provide your oncology and hematology care.  If you have a lab appointment with the Cancer Center, please go directly to the Cancer Center and check in at the registration area.  Wear comfortable clothing and clothing appropriate for easy access to any Portacath or PICC line.   We strive to give you quality time with your provider. You may need to reschedule your appointment if you arrive late (15 or more minutes).  Arriving late affects you and other patients whose appointments are after yours.  Also, if you miss three or more appointments without notifying the office, you may be dismissed from the clinic at the provider's discretion.      For prescription refill requests, have your pharmacy contact our office and allow 72 hours for refills to be completed.    Today you received the following chemotherapy and/or immunotherapy agents- Keytruda      To help prevent nausea and vomiting after your treatment, we encourage you to take your nausea medication as directed.  BELOW ARE SYMPTOMS THAT SHOULD BE REPORTED IMMEDIATELY: *FEVER GREATER THAN 100.4 F (38 C) OR HIGHER *CHILLS OR SWEATING *NAUSEA AND VOMITING THAT IS NOT CONTROLLED WITH YOUR NAUSEA MEDICATION *UNUSUAL SHORTNESS OF BREATH *UNUSUAL BRUISING OR BLEEDING *URINARY PROBLEMS (pain or burning when urinating, or frequent urination) *BOWEL PROBLEMS (unusual diarrhea, constipation, pain near the anus) TENDERNESS IN MOUTH AND THROAT WITH OR WITHOUT PRESENCE OF ULCERS (sore throat, sores in mouth, or a toothache) UNUSUAL RASH, SWELLING OR PAIN  UNUSUAL VAGINAL DISCHARGE OR ITCHING   Items with * indicate a potential emergency and should be followed up as soon as possible or go to the Emergency Department if any problems should occur.  Please show the CHEMOTHERAPY ALERT CARD or IMMUNOTHERAPY ALERT CARD at check-in to  the Emergency Department and triage nurse.  Should you have questions after your visit or need to cancel or reschedule your appointment, please contact [facility]  [phone] and follow the prompts.  Office hours are 8:00 a.m. to 4:30 p.m. Monday - Friday. Please note that voicemails left after 4:00 p.m. may not be returned until the following business day.  We are closed weekends and major holidays. You have access to a nurse at all times for urgent questions. Please call the main number to the clinic [phone] and follow the prompts.  For any non-urgent questions, you may also contact your provider using MyChart. We now offer e-Visits for anyone 18 and older to request care online for non-urgent symptoms. For details visit [email or web address].   Also download the MyChart app! Go to the app store, search "MyChart", open the app, select , and log in with your MyChart username and password.   

## 2022-08-29 NOTE — Assessment & Plan Note (Signed)
Follow up with  vascular surgey 

## 2022-08-29 NOTE — Assessment & Plan Note (Signed)
She is currently on immunotherapy.  Hemoglobin improved.  

## 2022-08-29 NOTE — Assessment & Plan Note (Signed)
Recommend otc claritin daily.  For nasal symptoms, recommend her to continue flonase nasal spray, recommend trial of azelastine nasal spray 

## 2022-08-29 NOTE — Progress Notes (Signed)
Hematology/Oncology Progress note [phone] [phone]      CHIEF COMPLAINTS/REASON FOR VISIT:  Follow up for endometrial cancer  ASSESSMENT & PLAN:   Cancer Staging  Endometrial adenocarcinoma [facility]) Staging form: Corpus Uteri - Carcinoma and Carcinosarcoma, AJCC 8th Edition - Clinical stage from 04/10/2021: FIGO Stage III, calculated as Stage Unknown (cT3, cNX, cM0) - Signed by Rickard Patience, MD on 08/24/2021   Endometrial adenocarcinoma (HCC) FIGO grade 3 poorly differentiated endometrial adenocarcinoma. s/p carboplatin and Taxol for 5 cycles, on Keytruda maintenance.  Labs are reviewed and discussed with patient. Proceed with Keytruda CT showed NED/stable disease, discussed with platelet.    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  FDG avid on PET scan.  Difficult biopsy due to patient's body habitus and deep position of the mass. Resolved on PET scan, suggesting that this lesion might be a metastatic lesion. Continue monitor   Venous insufficiency of both lower extremities Follow up with  vascular surgey  Anemia in chronic kidney disease (CKD) She is currently on immunotherapy.  Hemoglobin improved.   Encounter for antineoplastic immunotherapy Treatment plan as listed above.   Allergy Recommend otc claritin daily.  For nasal symptoms, recommend her to continue flonase nasal spray, recommend trial of azelastine nasal spray  Orders Placed This Encounter  Procedures   CBC with Differential    Standing Status:   Future    Standing Expiration Date:   10/10/2023   Comprehensive metabolic panel    Standing Status:   Future    Standing Expiration Date:   10/10/2023     Return of visit:  3 weeks Lab MD Greggory Brandy, MD, [name] [facility] Hematology Oncology 08/29/2022     HISTORY OF PRESENTING ILLNESS:   Valerie Case is a  72 y.o.  female presents for follow up of  FIGO grade 3 poorly differentiated  endometrial adenocarcinoma. Oncology History  Endometrial adenocarcinoma (HCC)  12/12/2020 Imaging   12/12/2020, CT abdomen pelvis with contrast showed no radiographic evidence of urinary tract neoplasm, calculi, hydronephrosis.  2.6 cm nonspecific left adrenal mass. 02/12/2021 CT hematuria work-up showed small uterine fibroids, no findings to explain hematuria.Hepatomegaly, diverticulosis without evidence of diverticulitis, Coronary artery disease   04/01/2021 -  Hospital Admission   04/01/2021 - 04/03/2021, patient was hospitalized due to symptomatic anemia, hemoglobin 6.9, heavy postmenopausal bleeding with passing large clots.  She also presented with increased creatinine level to 2.58 compared to her baseline level of 0.9 in November 2022.  Patient received IV iron infusion and 2 units of PRBC during the hospital stay..  04/02/2021, iron panel showed iron saturation 37, ferritin 37, TIBC 333-the studies were done after patient received blood transfusion.  At discharge, hemoglobin was 7.7.   04/01/2021 Initial Diagnosis   04/01/2020, endometrial biopsy showed poorly differentiated endometrial adenocarcinoma. Omniseq NGS showed TMB 48.4 mt/mb [high], MSI High, PD-L1-TPS <1%, PIK3CA H1047Q, Negative for BRAF, HER2, NTRK1 RET.     04/10/2021 Cancer Staging   Staging form: Corpus Uteri - Carcinoma and Carcinosarcoma, AJCC 8th Edition - Clinical stage from 04/10/2021: FIGO Stage III, calculated as Stage Unknown (cT3, cNX, cM0) - Signed by Rickard Patience, MD on 08/24/2021 Stage prefix: Initial diagnosis   04/15/2021 Imaging   PET showed 1. Large hypermetabolic endometrial mass consistent with known endometrial cancer. Suspect direct invasion/involvement of the right adnexa. Right pelvic sidewall hypermetabolic adenopathy.2. Enlarged and hypermetabolic left adrenal gland lesion could reflect a lipid poor hyperfunctioning adenoma but metastasis  is also possible. 3. No findings for metastatic disease involving the  chest or bonystructures.     04/24/2021 - 05/29/2021 Radiation Therapy   status post radiation to pelvis   08/05/2021 Imaging   MRI abdomen w wo contrast 1. Stable solid enhancing 2.7 cm left adrenal lesion, which was hypermetabolic on prior PET/CTs but is unchanged in size dating back to December 12, 2020. 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 However, unfortunately metastatic disease can not be entirely excluded and remains a pertinent differential consideration. Comparison with more remote prior imaging would be the most valuable tool in the assessment of this lesion, as demonstrating long-term stability would indicate this to be a benign lesion. However, if no prior imaging can be made available, would consider follow-up adrenal protocol CT with and without intravenous contrast material in 3 months as this would allow for further assessment of stability as well as enhancement and washout characteristics of the lesion potentially allowing for better characterization versus direct tissue sampling.2. Hepatomegaly and hepatic steatosis.3. Colonic diverticulosis without findings of acute diverticulitis    08/16/2021 - 09/27/2021 Chemotherapy   UTERINE Carboplatin AUC 5 / Paclitaxel q21d x 3      10/03/2021 Imaging   PET 1. No residual hypermetabolism in the uterus suggesting an excellent response to treatment. No findings for metastatic disease. 2. Resolution of hypermetabolism in the left adrenal gland lesion suggesting this was metastatic disease. 3. Diffuse marrow hypermetabolism likely due to rebound from chemotherapy or marrow stimulating drugs   10/18/2021 - 11/08/2021 Chemotherapy   AUC 5 / Paclitaxel/ Keytruda Q21d  x 2 cycles   11/29/2021 -  Chemotherapy   Patient is on Treatment Plan : UTERINE Pembrolizumab (200) q21d      04/30/2022 Imaging   CT chest abdomen pelvis w contrast  1. Similar to minimal decrease in size of a left adrenal nodule which was felt suspicious for metastatic disease on 10/03/2021 PET. 2. No evidence of new or progressive disease. 3. Coronary artery atherosclerosis. Aortic Atherosclerosis (ICD10-I70.0). 4. Incidental findings, including: Hepatomegaly. Possible constipation.   08/28/2022 Imaging   CT chest abdomen pelvis w contrast showed 1. Similar appearance of indeterminate left adrenal nodule. 2. Otherwise, no evidence of metastatic disease. 3. Hepatomegaly 4.  Possible constipation. 5. Coronary artery atherosclerosis. Aortic Atherosclerosis (ICD10-I70.0).       INTERVAL HISTORY Valerie Case is a 72 y.o. female who has above history reviewed by me today presents for follow up visit for anemia and endometrial cancer Overall she tolerates treatment, Patient denies nausea vomiting diarrhea.   Review of Systems  Constitutional:  Positive for fatigue. Negative for chills and fever.  HENT:   Negative for hearing loss and voice change.        Nasal congestion, sneezes  Eyes:  Negative for eye problems.  Respiratory:  Negative for chest tightness and cough.   Cardiovascular:  Negative for chest pain.  Gastrointestinal:  Negative for abdominal distention, abdominal pain and blood in stool.  Endocrine: Negative for hot flashes.  Genitourinary:  Negative for difficulty urinating, frequency and vaginal bleeding.   Musculoskeletal:  Positive for arthralgias.  Skin:  Positive for rash. Negative for itching.  Neurological:  Positive for headaches. Negative for extremity weakness.  Hematological:  Negative for adenopathy.  Psychiatric/Behavioral:  Negative for confusion.     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    SURGICAL HISTORY: Past Surgical History:  Procedure Laterality Date   CESAREAN SECTION  01/23/1979   IR IMAGING GUIDED PORT INSERTION  08/09/2021   TONSILLECTOMY  03/17/1956    SOCIAL HISTORY: Social History   Socioeconomic History   Marital status: Divorced    Spouse name: Not on file   Number of children: Not on file   Years of education: Not on file   Highest education level: Not on file  Occupational History   Not on file  Tobacco Use   Smoking status: Never   Smokeless tobacco: Never  Vaping Use   Vaping Use: Never used  Substance and Sexual Activity   Alcohol use: Not Currently   Drug use: Never   Sexual activity: Not Currently  Other Topics Concern   Not on file  Social History Narrative      Social Determinants of Health   Financial Resource Strain: Low Risk  (09/27/2021)   Overall Financial Resource Strain (CARDIA)    Difficulty of Paying Living Expenses: Not very hard  Food Insecurity: No Food Insecurity (09/27/2021)   Hunger Vital Sign    Worried About Running Out of Food in the Last Year: Never true    Ran Out of Food in the Last Year: Never true  Transportation Needs: No Transportation Needs (09/27/2021)   PRAPARE - Administrator, Civil Service (Medical): No    Lack of Transportation (Non-Medical): No  Physical Activity: Inactive (09/27/2021)   Exercise Vital Sign    Days of Exercise per Week: 0 days    Minutes of Exercise per Session: 0 min  Stress: Stress Concern Present (09/27/2021)   [employer] of Occupational Health - Occupational Stress Questionnaire    Feeling of Stress : Rather much  Social Connections: Moderately Integrated (09/27/2021)   Social Connection and Isolation Panel [NHANES]    Frequency of Communication with Friends and Family: Three times a week    Frequency of Social Gatherings with Friends and  Family: Three times a week    Attends Religious Services: 1 to 4 times per year    Active Member of Clubs or Organizations: No    Attends Banker Meetings: 1 to 4 times per year    Marital Status: Divorced  Intimate Partner Violence: Not At Risk (09/27/2021)   Humiliation, Afraid, Rape, and Kick questionnaire    Fear of Current or Ex-Partner: No    Emotionally Abused: No    Physically Abused: No    Sexually Abused: No    FAMILY HISTORY: Family History  Problem Relation Age of Onset   Breast cancer Mother 58   Cancer Mother    Cancer Father    Lung cancer Father    Breast cancer Cousin        dx 56s maternal    ALLERGIES:  is allergic to doxycycline and aspirin.  MEDICATIONS:  Current Outpatient Medications  Medication Sig Dispense Refill   acetaminophen (TYLENOL) 650 MG CR tablet Take 1,300 mg by mouth every 8 (eight) hours as needed for pain.     albuterol (VENTOLIN HFA) 108 (90 Base) MCG/ACT inhaler Inhale 2 puffs into the lungs  every 6 (six) hours as needed for wheezing or shortness of breath.     azelastine (ASTELIN) 0.1 % nasal spray Place 2 sprays into both nostrils daily. Use in each nostril as directed 30 mL 0   Cranberry-Vitamin C-Probiotic (AZO CRANBERRY PO) Take by mouth.     docusate sodium (COLACE) 100 MG capsule Take 1 capsule (100 mg total) by mouth 2 (two) times daily. 60 capsule 1   empagliflozin (JARDIANCE) 25 MG TABS tablet Take 25 mg by mouth daily.     ferrous sulfate 325 (65 FE) MG EC tablet Take 325 mg by mouth daily with breakfast.     FLUoxetine (PROZAC) 10 MG capsule Take 10 mg by mouth daily.     fluticasone (FLONASE) 50 MCG/ACT nasal spray Place 1 spray into both nostrils daily as needed for allergies or rhinitis.     hydrochlorothiazide (HYDRODIURIL) 12.5 MG tablet Take 12.5 mg by mouth daily.     hydrocortisone 2.5 % cream Apply topically 3 (three) times daily. 30 g 2   ibuprofen (ADVIL) 200 MG tablet Take 400 mg by mouth every 8  (eight) hours as needed for moderate pain.     lidocaine-prilocaine (EMLA) cream Apply to affected area once 30 g 3   lisinopril (ZESTRIL) 40 MG tablet Take 40 mg by mouth at bedtime.     LORazepam (ATIVAN) 0.5 MG tablet Take 1 tablet (0.5 mg total) by mouth every 8 (eight) hours as needed for anxiety (nausea vomiting). 30 tablet 0   metFORMIN (GLUCOPHAGE-XR) 500 MG 24 hr tablet Take 1,000 mg by mouth at bedtime.     metoprolol succinate (TOPROL-XL) 25 MG 24 hr tablet Take 25 mg by mouth daily.     mupirocin ointment (BACTROBAN) 2 % Apply 1 Application topically 3 (three) times daily. 22 g 0   NOVOLIN 70/30 FLEXPEN (70-30) 100 UNIT/ML KwikPen 50-110 Units See admin instructions. 50 units in the morning, 110 units at bedtime     ondansetron (ZOFRAN) 8 MG tablet Take 1 tablet (8 mg total) by mouth 2 (two) times daily as needed. Start on the third day after chemotherapy. 30 tablet 1   OZEMPIC, 1 MG/DOSE, 4 MG/3ML SOPN Inject 1 mg into the skin every Tuesday.     potassium chloride SA (KLOR-CON M) 20 MEQ tablet Take 1 tablet (20 mEq total) by mouth daily. 3 tablet 0   prochlorperazine (COMPAZINE) 10 MG tablet Take 1 tablet (10 mg total) by mouth every 6 (six) hours as needed (Nausea or vomiting). 30 tablet 1   rosuvastatin (CRESTOR) 10 MG tablet Take 10 mg by mouth daily.     traMADol (ULTRAM) 50 MG tablet Take 2 tablets (100 mg total) by mouth every 6 (six) hours as needed. 90 tablet 0   triamcinolone ointment (KENALOG) 0.5 % Apply 1 Application topically 2 (two) times daily. 30 g 0   No current facility-administered medications for this visit.   Facility-Administered Medications Ordered in Other Visits  Medication Dose Route Frequency Provider Last Rate Last Admin   heparin lock flush 100 UNIT/ML injection            heparin lock flush 100 unit/mL  500 Units Intracatheter Once PRN Rickard Patience, MD         PHYSICAL EXAMINATION: ECOG PERFORMANCE STATUS: 1 - Symptomatic but completely  ambulatory  Physical Exam Constitutional:      General: She is not in acute distress.    Appearance: She is obese.  HENT:  Head: Normocephalic and atraumatic.  Eyes:     General: No scleral icterus. Cardiovascular:     Rate and Rhythm: Normal rate.  Pulmonary:     Effort: Pulmonary effort is normal. No respiratory distress.     Breath sounds: No wheezing.  Abdominal:     General: Bowel sounds are normal. There is no distension.     Palpations: Abdomen is soft.  Musculoskeletal:        General: No deformity. Normal range of motion.     Cervical back: Normal range of motion.     Right lower leg: Edema present.     Left lower leg: Edema present.     Comments:    Skin:    General: Skin is warm and dry.     Findings: Rash present.     Comments: Chronic venous sufficiency skin changes  Neurological:     Mental Status: She is alert and oriented to person, place, and time. Mental status is at baseline.     Cranial Nerves: No cranial nerve deficit.     Coordination: Coordination normal.  Psychiatric:        Mood and Affect: Mood normal.    LABORATORY DATA:  I have reviewed the data as listed    Latest Ref Rng & Units 08/29/2022   10:06 AM 08/08/2022    9:50 AM 07/18/2022    9:52 AM  CBC  WBC 4.0 - 10.5 K/uL 8.8  5.2  5.6   Hemoglobin 12.0 - 15.0 g/dL 66.4  40.3  47.4   Hematocrit 36.0 - 46.0 % 36.1  34.5  33.4   Platelets 150 - 400 K/uL 297  258  255       Latest Ref Rng & Units 08/29/2022   10:06 AM 08/08/2022    9:50 AM 07/18/2022    9:52 AM  CMP  Glucose 70 - 99 mg/dL 259  563  99   BUN 8 - 23 mg/dL 35  36  44   Creatinine 0.44 - 1.00 mg/dL 8.75  6.43  3.29   Sodium 135 - 145 mmol/L 138  139  139   Potassium 3.5 - 5.1 mmol/L 4.2  4.3  4.6   Chloride 98 - 111 mmol/L 107  106  111   CO2 22 - 32 mmol/L 20  21  20    Calcium 8.9 - 10.3 mg/dL 9.7  51.8  84.1   Total Protein 6.5 - 8.1 g/dL 7.3  7.0  6.8   Total Bilirubin 0.3 - 1.2 mg/dL 0.4  0.2  0.2   Alkaline Phos 38  - 126 U/L 66  61  64   AST 15 - 41 U/L 20  28  18    ALT 0 - 44 U/L 16  19  14        RADIOGRAPHIC STUDIES: I have personally reviewed the radiological images as listed and agreed with the findings in the report. CT CHEST ABDOMEN PELVIS WO CONTRAST  Result Date: 08/28/2022 CLINICAL DATA:  Endometrial cancer diagnosed in January of 2023. Chemotherapy and radiation therapy in 2023. Currently on immunotherapy. * Tracking Code: BO * EXAM: CT CHEST, ABDOMEN AND PELVIS WITHOUT CONTRAST TECHNIQUE: Multidetector CT imaging of the chest, abdomen and pelvis was performed following the standard protocol without IV contrast. RADIATION DOSE REDUCTION: This exam was performed according to the departmental dose-optimization program which includes automated exposure control, adjustment of the mA and/or kV according to patient size and/or use of iterative reconstruction technique. COMPARISON:  04/30/2022 FINDINGS: CT CHEST FINDINGS Cardiovascular: Right Port-A-Cath tip high right atrium. Aortic atherosclerosis. Borderline cardiomegaly. Three vessel coronary artery calcification. Mediastinum/Nodes: No supraclavicular adenopathy. No mediastinal or hilar adenopathy, given limitations of unenhanced CT. Lungs/Pleura: No pleural fluid. Minimal biapical pleuroparenchymal scarring. 3 mm left lower lobe pulmonary nodule on 88/3 is felt to be similar to on the prior exam. Present back to 01/22/2022, favoring a benign etiology. Musculoskeletal: No acute osseous abnormality. Subcutaneous nodules about the posterior right and midline chest wall including at 11 mm on 04/02 and 1.0 cm on 07/02. Relatively similar to the prior PET of 04/15/2021, favoring sebaceous cyst. CT ABDOMEN PELVIS FINDINGS Hepatobiliary: Hepatomegaly. Segment 4A 3.1 cm hepatic cyst again identified. Normal gallbladder, without biliary ductal dilatation. Pancreas: Normal, without mass or ductal dilatation. Spleen: Normal in size, without focal abnormality.  Adrenals/Urinary Tract: Normal right adrenal gland and kidneys. Left adrenal nodule measures 2.8 by 2.4 cm on 58/2 versus 2.9 x 2.2 cm on the prior exam (when remeasured). No hydroureter or ureteric calculi.  No bladder calculi. Stomach/Bowel: Normal stomach, without wall thickening. Transverse duodenal diverticulum. Otherwise normal small bowel. Extensive colonic diverticulosis. Colonic stool burden suggests constipation. Normal terminal ileum. Vascular/Lymphatic: Aortic atherosclerosis. No abdominopelvic adenopathy. Reproductive: No well-defined uterine mass.  No adnexal mass. Other: No significant free fluid. Mild pelvic floor laxity. No evidence of omental or peritoneal disease. Musculoskeletal: No acute osseous abnormality. Trace L4-5 anterolisthesis with advanced degenerative disc disease at this level. IMPRESSION: 1. Similar appearance of indeterminate left adrenal nodule. 2. Otherwise, no evidence of metastatic disease. 3. Hepatomegaly 4.  Possible constipation. 5. Coronary artery atherosclerosis. Aortic Atherosclerosis (ICD10-I70.0). Electronically Signed   By: Jeronimo Greaves M.D.   On: 08/28/2022 09:10   VAS [place] LOWER EXTREMITY VENOUS REFLUX  Result Date: 07/07/2022  Lower Venous Reflux Study Patient Name:  MAIKOU MOLINA  Date of Exam:   07/04/2022 Medical Rec #: [id number]           Accession #:    [id number] Date of Birth: 10-Sep-1950           Patient Gender: F Patient Age:   34 years Exam Location:  [facility] Procedure:      VAS [place] LOWER EXTREMITY VENOUS REFLUX Referring Phys: Sheppard Plumber --------------------------------------------------------------------------------  Indications: Swelling, and Pain.  Performing Technologist: Salvadore Farber RVT  Examination Guidelines: A complete evaluation includes B-mode imaging, spectral Doppler, color Doppler, and power Doppler as needed of all accessible portions of each vessel. Bilateral testing is considered an integral part of a complete  examination. Limited examinations for reoccurring indications may be performed as noted. The reflux portion of the exam is performed with the patient in reverse Trendelenburg. Significant venous reflux is defined as >500 ms in the superficial venous system, and >1 second in the deep venous system.   Summary: Bilateral: - No evidence of deep vein thrombosis seen in the lower extremities, bilaterally, from the common femoral through the popliteal veins. - No evidence of superficial venous thrombosis in the lower extremities, bilaterally. - No evidence of deep venous insufficiency seen bilaterally in the lower extremity. - No evidence of superficial venous reflux seen in the greater saphenous veins bilaterally. - No evidence of superficial venous reflux seen in the short saphenous veins bilaterally.  *See table(s) above for measurements and observations. Electronically signed by Festus Barren MD on 07/07/2022 at 7:27:20 AM.    Final

## 2022-08-29 NOTE — Assessment & Plan Note (Signed)
Treatment plan as listed above. 

## 2022-09-19 ENCOUNTER — Inpatient Hospital Stay: Payer: Medicare PPO

## 2022-09-19 ENCOUNTER — Inpatient Hospital Stay: Payer: Medicare PPO | Attending: Obstetrics and Gynecology | Admitting: Oncology

## 2022-09-19 ENCOUNTER — Encounter: Payer: Self-pay | Admitting: Oncology

## 2022-09-19 VITALS — BP 121/66 | HR 99 | Temp 98.7°F | Resp 20 | Ht 68.0 in | Wt 280.0 lb

## 2022-09-19 DIAGNOSIS — N189 Chronic kidney disease, unspecified: Secondary | ICD-10-CM | POA: Diagnosis not present

## 2022-09-19 DIAGNOSIS — Z79899 Other long term (current) drug therapy: Secondary | ICD-10-CM | POA: Insufficient documentation

## 2022-09-19 DIAGNOSIS — M2550 Pain in unspecified joint: Secondary | ICD-10-CM | POA: Insufficient documentation

## 2022-09-19 DIAGNOSIS — C541 Malignant neoplasm of endometrium: Secondary | ICD-10-CM

## 2022-09-19 DIAGNOSIS — R5383 Other fatigue: Secondary | ICD-10-CM | POA: Diagnosis not present

## 2022-09-19 DIAGNOSIS — I700 Atherosclerosis of aorta: Secondary | ICD-10-CM | POA: Insufficient documentation

## 2022-09-19 DIAGNOSIS — Z886 Allergy status to analgesic agent status: Secondary | ICD-10-CM | POA: Insufficient documentation

## 2022-09-19 DIAGNOSIS — C189 Malignant neoplasm of colon, unspecified: Secondary | ICD-10-CM

## 2022-09-19 DIAGNOSIS — Z881 Allergy status to other antibiotic agents status: Secondary | ICD-10-CM | POA: Diagnosis not present

## 2022-09-19 DIAGNOSIS — Z79620 Long term (current) use of immunosuppressive biologic: Secondary | ICD-10-CM | POA: Insufficient documentation

## 2022-09-19 DIAGNOSIS — Z809 Family history of malignant neoplasm, unspecified: Secondary | ICD-10-CM | POA: Diagnosis not present

## 2022-09-19 DIAGNOSIS — I2510 Atherosclerotic heart disease of native coronary artery without angina pectoris: Secondary | ICD-10-CM | POA: Insufficient documentation

## 2022-09-19 DIAGNOSIS — Z923 Personal history of irradiation: Secondary | ICD-10-CM | POA: Diagnosis not present

## 2022-09-19 DIAGNOSIS — I129 Hypertensive chronic kidney disease with stage 1 through stage 4 chronic kidney disease, or unspecified chronic kidney disease: Secondary | ICD-10-CM | POA: Insufficient documentation

## 2022-09-19 DIAGNOSIS — Z803 Family history of malignant neoplasm of breast: Secondary | ICD-10-CM | POA: Insufficient documentation

## 2022-09-19 DIAGNOSIS — R160 Hepatomegaly, not elsewhere classified: Secondary | ICD-10-CM | POA: Diagnosis not present

## 2022-09-19 DIAGNOSIS — R0602 Shortness of breath: Secondary | ICD-10-CM | POA: Diagnosis not present

## 2022-09-19 DIAGNOSIS — E278 Other specified disorders of adrenal gland: Secondary | ICD-10-CM | POA: Insufficient documentation

## 2022-09-19 DIAGNOSIS — I872 Venous insufficiency (chronic) (peripheral): Secondary | ICD-10-CM | POA: Insufficient documentation

## 2022-09-19 DIAGNOSIS — Z86018 Personal history of other benign neoplasm: Secondary | ICD-10-CM | POA: Insufficient documentation

## 2022-09-19 DIAGNOSIS — Z5112 Encounter for antineoplastic immunotherapy: Secondary | ICD-10-CM | POA: Diagnosis present

## 2022-09-19 DIAGNOSIS — Z9089 Acquired absence of other organs: Secondary | ICD-10-CM | POA: Diagnosis not present

## 2022-09-19 DIAGNOSIS — D631 Anemia in chronic kidney disease: Secondary | ICD-10-CM

## 2022-09-19 DIAGNOSIS — M5136 Other intervertebral disc degeneration, lumbar region: Secondary | ICD-10-CM | POA: Insufficient documentation

## 2022-09-19 DIAGNOSIS — Z801 Family history of malignant neoplasm of trachea, bronchus and lung: Secondary | ICD-10-CM | POA: Insufficient documentation

## 2022-09-19 LAB — CBC WITH DIFFERENTIAL/PLATELET
Abs Immature Granulocytes: 0.03 K/uL (ref 0.00–0.07)
Basophils Absolute: 0 K/uL (ref 0.0–0.1)
Basophils Relative: 1 %
Eosinophils Absolute: 0.3 K/uL (ref 0.0–0.5)
Eosinophils Relative: 4 %
HCT: 34.6 % — ABNORMAL LOW (ref 36.0–46.0)
Hemoglobin: 11.3 g/dL — ABNORMAL LOW (ref 12.0–15.0)
Immature Granulocytes: 1 %
Lymphocytes Relative: 18 %
Lymphs Abs: 1 K/uL (ref 0.7–4.0)
MCH: 30.8 pg (ref 26.0–34.0)
MCHC: 32.7 g/dL (ref 30.0–36.0)
MCV: 94.3 fL (ref 80.0–100.0)
Monocytes Absolute: 0.6 K/uL (ref 0.1–1.0)
Monocytes Relative: 11 %
Neutro Abs: 3.8 K/uL (ref 1.7–7.7)
Neutrophils Relative %: 65 %
Platelets: 264 K/uL (ref 150–400)
RBC: 3.67 MIL/uL — ABNORMAL LOW (ref 3.87–5.11)
RDW: 14.1 % (ref 11.5–15.5)
WBC: 5.8 K/uL (ref 4.0–10.5)
nRBC: 0 % (ref 0.0–0.2)

## 2022-09-19 LAB — COMPREHENSIVE METABOLIC PANEL WITH GFR
ALT: 17 U/L (ref 0–44)
AST: 19 U/L (ref 15–41)
Albumin: 4 g/dL (ref 3.5–5.0)
Alkaline Phosphatase: 55 U/L (ref 38–126)
Anion gap: 10 (ref 5–15)
BUN: 36 mg/dL — ABNORMAL HIGH (ref 8–23)
CO2: 21 mmol/L — ABNORMAL LOW (ref 22–32)
Calcium: 9.9 mg/dL (ref 8.9–10.3)
Chloride: 109 mmol/L (ref 98–111)
Creatinine, Ser: 1.04 mg/dL — ABNORMAL HIGH (ref 0.44–1.00)
GFR, Estimated: 57 mL/min — ABNORMAL LOW
Glucose, Bld: 117 mg/dL — ABNORMAL HIGH (ref 70–99)
Potassium: 4.3 mmol/L (ref 3.5–5.1)
Sodium: 140 mmol/L (ref 135–145)
Total Bilirubin: 0.2 mg/dL — ABNORMAL LOW (ref 0.3–1.2)
Total Protein: 7.1 g/dL (ref 6.5–8.1)

## 2022-09-19 LAB — TSH: TSH: 1.244 u[IU]/mL (ref 0.350–4.500)

## 2022-09-19 MED ORDER — HEPARIN SOD (PORK) LOCK FLUSH 100 UNIT/ML IV SOLN
500.0000 [IU] | Freq: Once | INTRAVENOUS | Status: AC | PRN
  Administered 2022-09-19: 500 [IU]
  Filled 2022-09-19: qty 5

## 2022-09-19 MED ORDER — SODIUM CHLORIDE 0.9 % IV SOLN
200.0000 mg | Freq: Once | INTRAVENOUS | Status: AC
  Administered 2022-09-19: 200 mg via INTRAVENOUS
  Filled 2022-09-19: qty 8

## 2022-09-19 MED ORDER — SODIUM CHLORIDE 0.9 % IV SOLN
Freq: Once | INTRAVENOUS | Status: AC
  Filled 2022-09-19: qty 250

## 2022-09-19 NOTE — Assessment & Plan Note (Signed)
Follow up with  vascular surgey 

## 2022-09-19 NOTE — Progress Notes (Signed)
Hematology/Oncology Progress note [phone] [phone]      CHIEF COMPLAINTS/REASON FOR VISIT:  Follow up for endometrial cancer  ASSESSMENT & PLAN:   Cancer Staging  Endometrial adenocarcinoma [facility]) Staging form: Corpus Uteri - Carcinoma and Carcinosarcoma, AJCC 8th Edition - Clinical stage from 04/10/2021: FIGO Stage III, calculated as Stage Unknown (cT3, cNX, cM0) - Signed by Rickard Patience, MD on 08/24/2021   Endometrial adenocarcinoma (HCC) FIGO grade 3 poorly differentiated endometrial adenocarcinoma. s/p carboplatin and Taxol for 5 cycles, on Keytruda maintenance.  Labs are reviewed and discussed with patient. Proceed with Keytruda CT showed NED/stable disease, discussed with    Adrenal mass (HCC) lipid poor hyperfunctioning adenoma vs mets.  FDG avid on PET scan.  Difficult biopsy due to patient's body habitus and deep position of the mass. Resolved on PET scan, suggesting that this lesion might be a metastatic lesion. Continue monitor   Venous insufficiency of both lower extremities Follow up with  vascular surgey  Anemia in chronic kidney disease (CKD) She is currently on immunotherapy.  Hemoglobin improved.   Encounter for antineoplastic immunotherapy Treatment plan as listed above.   No orders of the defined types were placed in this encounter.   Return of visit:  3 weeks Lab MD Greggory Brandy, MD, [name] [facility] 09/19/2022     HISTORY OF PRESENTING ILLNESS:   Valerie Case is a  72 y.o.  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 Oncology History  Endometrial adenocarcinoma (HCC)  12/12/2020 Imaging   12/12/2020, CT abdomen pelvis with contrast showed no radiographic evidence of urinary tract neoplasm, calculi, hydronephrosis.  2.6 cm nonspecific left adrenal mass. 02/12/2021 CT hematuria work-up showed small uterine fibroids, no findings to  explain hematuria.Hepatomegaly, diverticulosis without evidence of diverticulitis, Coronary artery disease   04/01/2021 -  Hospital Admission   04/01/2021 - 04/03/2021, patient was hospitalized due to symptomatic anemia, hemoglobin 6.9, heavy postmenopausal bleeding with passing large clots.  She also presented with increased creatinine level to 2.58 compared to her baseline level of 0.9 in November 2022.  Patient received IV iron infusion and 2 units of PRBC during the hospital stay..  04/02/2021, iron panel showed iron saturation 37, ferritin 37, TIBC 333-the studies were done after patient received blood transfusion.  At discharge, hemoglobin was 7.7.   04/01/2021 Initial Diagnosis   04/01/2020, endometrial biopsy showed poorly differentiated endometrial adenocarcinoma. Omniseq NGS showed TMB 48.4 mt/mb [high], MSI High, PD-L1-TPS <1%, PIK3CA H1047Q, Negative for BRAF, HER2, NTRK1 RET.     04/10/2021 Cancer Staging   Staging form: Corpus Uteri - Carcinoma and Carcinosarcoma, AJCC 8th Edition - Clinical stage from 04/10/2021: FIGO Stage III, calculated as Stage Unknown (cT3, cNX, cM0) - Signed by Rickard Patience, MD on 08/24/2021 Stage prefix: Initial diagnosis   04/15/2021 Imaging   PET showed 1. Large hypermetabolic endometrial mass consistent with known endometrial cancer. Suspect direct invasion/involvement of the right adnexa. Right pelvic sidewall hypermetabolic adenopathy.2. 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 3. No findings for metastatic disease involving the chest or bonystructures.     04/24/2021 - 05/29/2021 Radiation Therapy   status post radiation to pelvis   08/05/2021 Imaging   MRI abdomen w wo contrast 1. Stable solid enhancing 2.7 cm left adrenal lesion, which was hypermetabolic on prior PET/CTs but is unchanged in size dating back to December 12, 2020. While the imaging characteristics are  again nonspecific, given its relative stability since September 2022 and the relative rarity of isolated adrenal metastases this is favored to reflect a lipid poor adenoma. However, unfortunately metastatic disease can not be entirely excluded and remains a pertinent differential consideration. Comparison with more remote prior imaging would be the most valuable tool in the assessment of this lesion, as demonstrating long-term stability would indicate this to be a benign lesion. However, if no prior imaging can be made available, would consider follow-up adrenal protocol CT with and without intravenous contrast material in 3 months as this would allow for further assessment of stability as well as enhancement and washout characteristics of the lesion potentially allowing for better characterization versus direct tissue sampling.2. Hepatomegaly and hepatic steatosis.3. Colonic diverticulosis without findings of acute diverticulitis    08/16/2021 - 09/27/2021 Chemotherapy   UTERINE Carboplatin AUC 5 / Paclitaxel q21d x 3      10/03/2021 Imaging   PET 1. No residual hypermetabolism in the uterus suggesting an excellent response to treatment. No findings for metastatic disease. 2. Resolution of hypermetabolism in the left adrenal gland lesion suggesting this was metastatic disease. 3. Diffuse marrow hypermetabolism likely due to rebound from chemotherapy or marrow stimulating drugs   10/18/2021 - 11/08/2021 Chemotherapy   AUC 5 / Paclitaxel/ Keytruda Q21d  x 2 cycles   11/29/2021 -  Chemotherapy   Patient is on Treatment Plan : UTERINE Pembrolizumab (200) q21d     04/30/2022 Imaging   CT chest abdomen pelvis w contrast  1. Similar to minimal decrease in size of a left adrenal nodule which was felt suspicious for metastatic disease on 10/03/2021 PET. 2. No evidence of new or progressive disease. 3. Coronary artery atherosclerosis. Aortic Atherosclerosis (ICD10-I70.0). 4. Incidental findings, including:  Hepatomegaly. Possible constipation.   08/28/2022 Imaging   CT chest abdomen pelvis w contrast showed 1. Similar appearance of indeterminate left adrenal nodule. 2. Otherwise, no evidence of metastatic disease. 3. Hepatomegaly 4.  Possible constipation. 5. Coronary artery atherosclerosis. Aortic Atherosclerosis (ICD10-I70.0).       INTERVAL HISTORY BRINLY SHUTLER is a 72 y.o. female who has above history reviewed by me today presents for follow up visit for anemia and endometrial cancer Overall she tolerates treatment, slightly more SOB when she is outdoor, due to hot weather and high humidity.  Patient denies nausea vomiting diarrhea.   Review of Systems  Constitutional:  Positive for fatigue. Negative for chills and fever.  HENT:   Negative for hearing loss and voice change.        Nasal congestion, sneezes  Eyes:  Negative for eye problems.  Respiratory:  Negative for chest tightness and cough.   Cardiovascular:  Negative for chest pain.  Gastrointestinal:  Negative for abdominal distention, abdominal pain and blood in stool.  Endocrine: Negative for hot flashes.  Genitourinary:  Negative for difficulty urinating, frequency and vaginal bleeding.   Musculoskeletal:  Positive for arthralgias.  Skin:  Positive for rash. Negative for itching.  Neurological:  Positive for headaches. Negative for extremity weakness.  Hematological:  Negative for adenopathy.  Psychiatric/Behavioral:  Negative for confusion.     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    SURGICAL HISTORY: Past Surgical History:  Procedure Laterality Date   CESAREAN SECTION  01/23/1979   IR IMAGING GUIDED PORT INSERTION  08/09/2021    TONSILLECTOMY  03/17/1956    SOCIAL HISTORY: Social History   Socioeconomic History   Marital status: Divorced    Spouse name: Not on file   Number of children: Not on file   Years of education: Not on file   Highest education level: Not on file  Occupational History   Not on file  Tobacco Use   Smoking status: Never   Smokeless tobacco: Never  Vaping Use   Vaping Use: Never used  Substance and Sexual Activity   Alcohol use: Not Currently   Drug use: Never   Sexual activity: Not Currently  Other Topics Concern   Not on file  Social History Narrative      Social Determinants of Health   Financial Resource Strain: Low Risk  (09/27/2021)   Overall Financial Resource Strain (CARDIA)    Difficulty of Paying Living Expenses: Not very hard  Food Insecurity: No Food Insecurity (09/27/2021)   Hunger Vital Sign    Worried About Running Out of Food in the Last Year: Never true    Ran Out of Food in the Last Year: Never true  Transportation Needs: No Transportation Needs (09/27/2021)   PRAPARE - Administrator, Civil Service (Medical): No    Lack of Transportation (Non-Medical): No  Physical Activity: Inactive (09/27/2021)   Exercise Vital Sign    Days of Exercise per Week: 0 days    Minutes of Exercise per Session: 0 min  Stress: Stress Concern Present (09/27/2021)   [employer] of Occupational Health - Occupational Stress Questionnaire    Feeling of Stress : Rather much  Social Connections: Moderately Integrated (09/27/2021)   Social Connection and Isolation Panel [NHANES]    Frequency of Communication with Friends and Family: Three times a week    Frequency of Social Gatherings with Friends and Family: Three times a week    Attends Religious Services: 1 to 4 times per year    Active Member of Clubs or Organizations: No    Attends Banker Meetings: 1 to 4 times per year    Marital Status: Divorced  Intimate Partner Violence: Not At Risk  (09/27/2021)   Humiliation, Afraid, Rape, and Kick questionnaire    Fear of Current or Ex-Partner: No    Emotionally Abused: No    Physically Abused: No    Sexually Abused: No    FAMILY HISTORY: Family History  Problem Relation Age of Onset   Breast cancer Mother 53   Cancer Mother    Cancer Father    Lung cancer Father    Breast cancer Cousin        dx 38s maternal    ALLERGIES:  is allergic to doxycycline and aspirin.  MEDICATIONS:  Current Outpatient Medications  Medication Sig Dispense Refill   acetaminophen (TYLENOL) 650 MG CR tablet Take 1,300 mg by mouth every 8 (eight) hours as needed for pain.     albuterol (VENTOLIN HFA) 108 (90 Base) MCG/ACT inhaler Inhale 2 puffs into the lungs every 6 (six) hours as needed for wheezing or shortness of breath.     azelastine (ASTELIN) 0.1 % nasal spray Place 2 sprays into both nostrils daily. Use in each nostril as directed 30 mL 0   Cranberry-Vitamin C-Probiotic (AZO CRANBERRY PO) Take by mouth.  docusate sodium (COLACE) 100 MG capsule Take 1 capsule (100 mg total) by mouth 2 (two) times daily. 60 capsule 1   empagliflozin (JARDIANCE) 25 MG TABS tablet Take 25 mg by mouth daily.     ferrous sulfate 325 (65 FE) MG EC tablet Take 325 mg by mouth daily with breakfast.     FLUoxetine (PROZAC) 10 MG capsule Take 10 mg by mouth daily.     fluticasone (FLONASE) 50 MCG/ACT nasal spray Place 1 spray into both nostrils daily as needed for allergies or rhinitis.     hydrochlorothiazide (HYDRODIURIL) 12.5 MG tablet Take 12.5 mg by mouth daily.     hydrocortisone 2.5 % cream Apply topically 3 (three) times daily. 30 g 2   ibuprofen (ADVIL) 200 MG tablet Take 400 mg by mouth every 8 (eight) hours as needed for moderate pain.     lidocaine-prilocaine (EMLA) cream Apply to affected area once 30 g 3   lisinopril (ZESTRIL) 40 MG tablet Take 40 mg by mouth at bedtime.     LORazepam (ATIVAN) 0.5 MG tablet Take 1 tablet (0.5 mg total) by mouth every 8  (eight) hours as needed for anxiety (nausea vomiting). 30 tablet 0   metFORMIN (GLUCOPHAGE-XR) 500 MG 24 hr tablet Take 1,000 mg by mouth at bedtime.     metoprolol succinate (TOPROL-XL) 25 MG 24 hr tablet Take 25 mg by mouth daily.     mupirocin ointment (BACTROBAN) 2 % Apply 1 Application topically 3 (three) times daily. 22 g 0   NOVOLIN 70/30 FLEXPEN (70-30) 100 UNIT/ML KwikPen 50-110 Units See admin instructions. 50 units in the morning, 110 units at bedtime     ondansetron (ZOFRAN) 8 MG tablet Take 1 tablet (8 mg total) by mouth 2 (two) times daily as needed. Start on the third day after chemotherapy. 30 tablet 1   OZEMPIC, 1 MG/DOSE, 4 MG/3ML SOPN Inject 1 mg into the skin every Tuesday.     potassium chloride SA (KLOR-CON M) 20 MEQ tablet Take 1 tablet (20 mEq total) by mouth daily. 3 tablet 0   prochlorperazine (COMPAZINE) 10 MG tablet Take 1 tablet (10 mg total) by mouth every 6 (six) hours as needed (Nausea or vomiting). 30 tablet 1   traMADol (ULTRAM) 50 MG tablet Take 2 tablets (100 mg total) by mouth every 6 (six) hours as needed. 90 tablet 0   triamcinolone ointment (KENALOG) 0.5 % Apply 1 Application topically 2 (two) times daily. 30 g 0   No current facility-administered medications for this visit.   Facility-Administered Medications Ordered in Other Visits  Medication Dose Route Frequency Provider Last Rate Last Admin   heparin lock flush 100 UNIT/ML injection              PHYSICAL EXAMINATION: ECOG PERFORMANCE STATUS: 1 - Symptomatic but completely ambulatory  Physical Exam Constitutional:      General: She is not in acute distress.    Appearance: She is obese.  HENT:     Head: Normocephalic and atraumatic.  Eyes:     General: No scleral icterus. Cardiovascular:     Rate and Rhythm: Normal rate.  Pulmonary:     Effort: Pulmonary effort is normal. No respiratory distress.     Breath sounds: No wheezing.  Abdominal:     General: Bowel sounds are normal. There is  no distension.     Palpations: Abdomen is soft.  Musculoskeletal:        General: No deformity. Normal range of motion.  Cervical back: Normal range of motion.     Right lower leg: Edema present.     Left lower leg: Edema present.     Comments:    Skin:    General: Skin is warm and dry.     Findings: Rash present.     Comments: Chronic venous sufficiency skin changes  Neurological:     Mental Status: She is alert and oriented to person, place, and time. Mental status is at baseline.     Cranial Nerves: No cranial nerve deficit.     Coordination: Coordination normal.  Psychiatric:        Mood and Affect: Mood normal.    LABORATORY DATA:  I have reviewed the data as listed    Latest Ref Rng & Units 09/19/2022    9:37 AM 08/29/2022   10:06 AM 08/08/2022    9:50 AM  CBC  WBC 4.0 - 10.5 K/uL 5.8  8.8  5.2   Hemoglobin 12.0 - 15.0 g/dL 16.1  09.6  04.5   Hematocrit 36.0 - 46.0 % 34.6  36.1  34.5   Platelets 150 - 400 K/uL 264  297  258       Latest Ref Rng & Units 08/29/2022   10:06 AM 08/08/2022    9:50 AM 07/18/2022    9:52 AM  CMP  Glucose 70 - 99 mg/dL 409  811  99   BUN 8 - 23 mg/dL 35  36  44   Creatinine 0.44 - 1.00 mg/dL 9.14  7.82  9.56   Sodium 135 - 145 mmol/L 138  139  139   Potassium 3.5 - 5.1 mmol/L 4.2  4.3  4.6   Chloride 98 - 111 mmol/L 107  106  111   CO2 22 - 32 mmol/L 20  21  20    Calcium 8.9 - 10.3 mg/dL 9.7  21.3  08.6   Total Protein 6.5 - 8.1 g/dL 7.3  7.0  6.8   Total Bilirubin 0.3 - 1.2 mg/dL 0.4  0.2  0.2   Alkaline Phos 38 - 126 U/L 66  61  64   AST 15 - 41 U/L 20  28  18    ALT 0 - 44 U/L 16  19  14        RADIOGRAPHIC STUDIES: I have personally reviewed the radiological images as listed and agreed with the findings in the report. CT CHEST ABDOMEN PELVIS WO CONTRAST  Result Date: 08/28/2022 CLINICAL DATA:  Endometrial cancer diagnosed in January of 2023. Chemotherapy and radiation therapy in 2023. Currently on immunotherapy. * Tracking  Code: BO * EXAM: CT CHEST, ABDOMEN AND PELVIS WITHOUT CONTRAST TECHNIQUE: Multidetector CT imaging of the chest, abdomen and pelvis was performed following the standard protocol without IV contrast. RADIATION DOSE REDUCTION: This exam was performed according to the departmental dose-optimization program which includes automated exposure control, adjustment of the mA and/or kV according to patient size and/or use of iterative reconstruction technique. COMPARISON:  04/30/2022 FINDINGS: CT CHEST FINDINGS Cardiovascular: Right Port-A-Cath tip high right atrium. Aortic atherosclerosis. Borderline cardiomegaly. Three vessel coronary artery calcification. Mediastinum/Nodes: No supraclavicular adenopathy. No mediastinal or hilar adenopathy, given limitations of unenhanced CT. Lungs/Pleura: No pleural fluid. Minimal biapical pleuroparenchymal scarring. 3 mm left lower lobe pulmonary nodule on 88/3 is felt to be similar to on the prior exam. Present back to 01/22/2022, favoring a benign etiology. Musculoskeletal: No acute osseous abnormality. Subcutaneous nodules about the posterior right and midline chest wall including at 11 mm  on 04/02 and 1.0 cm on 07/02. Relatively similar to the prior PET of 04/15/2021, favoring sebaceous cyst. CT ABDOMEN PELVIS FINDINGS Hepatobiliary: Hepatomegaly. Segment 4A 3.1 cm hepatic cyst again identified. Normal gallbladder, without biliary ductal dilatation. Pancreas: Normal, without mass or ductal dilatation. Spleen: Normal in size, without focal abnormality. Adrenals/Urinary Tract: Normal right adrenal gland and kidneys. Left adrenal nodule measures 2.8 by 2.4 cm on 58/2 versus 2.9 x 2.2 cm on the prior exam (when remeasured). No hydroureter or ureteric calculi.  No bladder calculi. Stomach/Bowel: Normal stomach, without wall thickening. Transverse duodenal diverticulum. Otherwise normal small bowel. Extensive colonic diverticulosis. Colonic stool burden suggests constipation. Normal  terminal ileum. Vascular/Lymphatic: Aortic atherosclerosis. No abdominopelvic adenopathy. Reproductive: No well-defined uterine mass.  No adnexal mass. Other: No significant free fluid. Mild pelvic floor laxity. No evidence of omental or peritoneal disease. Musculoskeletal: No acute osseous abnormality. Trace L4-5 anterolisthesis with advanced degenerative disc disease at this level. IMPRESSION: 1. Similar appearance of indeterminate left adrenal nodule. 2. Otherwise, no evidence of metastatic disease. 3. Hepatomegaly 4.  Possible constipation. 5. Coronary artery atherosclerosis. Aortic Atherosclerosis (ICD10-I70.0). Electronically Signed   By: Jeronimo Greaves M.D.   On: 08/28/2022 09:10   VAS [place] LOWER EXTREMITY VENOUS REFLUX  Result Date: 07/07/2022  Lower Venous Reflux Study Patient Name:  CASIA PLA  Date of Exam:   07/04/2022 Medical Rec #: [id number]           Accession #:    [id number] Date of Birth: 11-15-1950           Patient Gender: F Patient Age:   45 years Exam Location:  [facility] Procedure:      VAS [place] LOWER EXTREMITY VENOUS REFLUX Referring Phys: Sheppard Plumber --------------------------------------------------------------------------------  Indications: Swelling, and Pain.  Performing Technologist: Salvadore Farber RVT  Examination Guidelines: A complete evaluation includes B-mode imaging, spectral Doppler, color Doppler, and power Doppler as needed of all accessible portions of each vessel. Bilateral testing is considered an integral part of a complete examination. Limited examinations for reoccurring indications may be performed as noted. The reflux portion of the exam is performed with the patient in reverse Trendelenburg. Significant venous reflux is defined as >500 ms in the superficial venous system, and >1 second in the deep venous system.   Summary: Bilateral: - No evidence of deep vein thrombosis seen in the lower extremities, bilaterally, from the common femoral through the  popliteal veins. - No evidence of superficial venous thrombosis in the lower extremities, bilaterally. - No evidence of deep venous insufficiency seen bilaterally in the lower extremity. - No evidence of superficial venous reflux seen in the greater saphenous veins bilaterally. - No evidence of superficial venous reflux seen in the short saphenous veins bilaterally.  *See table(s) above for measurements and observations. Electronically signed by Festus Barren MD on 07/07/2022 at 7:27:20 AM.    Final

## 2022-09-19 NOTE — Assessment & Plan Note (Addendum)
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 s/p carboplatin and Taxol for 5 cycles, on Keytruda maintenance.  Labs are reviewed and discussed with patient. Proceed with Keytruda CT showed NED/stable disease, discussed with

## 2022-09-19 NOTE — Assessment & Plan Note (Signed)
She is currently on immunotherapy.  Hemoglobin improved.  

## 2022-09-19 NOTE — Assessment & Plan Note (Signed)
lipid poor hyperfunctioning adenoma vs mets.  FDG avid on PET scan.  Difficult biopsy due to patient's body habitus and deep position of the mass. Resolved on PET scan, suggesting that this lesion might be a metastatic lesion. Continue monitor  

## 2022-09-19 NOTE — Assessment & Plan Note (Signed)
Treatment plan as listed above. 

## 2022-10-03 ENCOUNTER — Ambulatory Visit (INDEPENDENT_AMBULATORY_CARE_PROVIDER_SITE_OTHER): Payer: Medicare PPO | Admitting: Nurse Practitioner

## 2022-10-03 DIAGNOSIS — I10 Essential (primary) hypertension: Secondary | ICD-10-CM | POA: Diagnosis not present

## 2022-10-03 DIAGNOSIS — I890 Lymphedema, not elsewhere classified: Secondary | ICD-10-CM | POA: Diagnosis not present

## 2022-10-04 ENCOUNTER — Encounter (INDEPENDENT_AMBULATORY_CARE_PROVIDER_SITE_OTHER): Payer: Self-pay | Admitting: Nurse Practitioner

## 2022-10-04 NOTE — Progress Notes (Signed)
Subjective:    Patient ID: Valerie Case, female    DOB: 25-Oct-1950, 72 y.o.   MRN: [id number] Chief Complaint  Patient presents with   Venous Insufficiency    Valerie Case is a 72 year old female who returns today for evaluation of lower extremity edema.  She initially presented with lower extremity edema with blisters.  Since her initial visit she has been wearing medical grade compression stockings diligently.  Her swelling has been well-controlled with no recurrence of wounds or blisters.  She denies any episodes of cellulitis.  Overall she is very happy with the level of control of swelling of her lower extremity edema.    Review of Systems  Cardiovascular:  Positive for leg swelling.  All other systems reviewed and are negative.      Objective:   Physical Exam Vitals reviewed.  HENT:     Head: Normocephalic.  Cardiovascular:     Rate and Rhythm: Normal rate.     Pulses: Normal pulses.  Pulmonary:     Effort: Pulmonary effort is normal.  Musculoskeletal:     Right lower leg: 1+ Edema present.     Left lower leg: 1+ Edema present.  Skin:    General: Skin is warm and dry.  Neurological:     Mental Status: She is alert and oriented to person, place, and time.  Psychiatric:        Mood and Affect: Mood normal.        Behavior: Behavior normal.        Thought Content: Thought content normal.        Judgment: Judgment normal.     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    Social History   Socioeconomic History   Marital status: Divorced    Spouse name: Not on file    Number of children: Not on file   Years of education: Not on file   Highest education level: Not on file  Occupational History   Not on file  Tobacco Use   Smoking status: Never   Smokeless tobacco: Never  Vaping Use   Vaping status: Never Used  Substance and Sexual Activity   Alcohol use: Not Currently   Drug use: Never   Sexual activity: Not Currently  Other Topics Concern   Not on file  Social History Narrative      Social Determinants of Health   Financial Resource Strain: Low Risk  (08/12/2022)   Received from [facility], [facility]   Overall Financial Resource Strain (CARDIA)    Difficulty of Paying Living Expenses: Not hard at all  Food Insecurity: No Food Insecurity (08/12/2022)   Received from [facility], [facility]   Hunger Vital Sign    Worried About Running Out of Food in the Last Year: Never true    Ran Out of Food in the Last Year: Never true  Transportation Needs: No Transportation Needs (08/12/2022)   Received from  [facility], [facility]   PRAPARE - Transportation    In the past 12 months, has lack of transportation kept you from medical appointments or from getting medications?: No    Lack of Transportation (Non-Medical): No  Physical Activity: Inactive (09/27/2021)   Exercise Vital Sign    Days of Exercise per Week: 0 days    Minutes of Exercise per Session: 0 min  Stress: Stress Concern Present (09/27/2021)   [employer] of Occupational Health - Occupational Stress Questionnaire    Feeling of Stress : Rather much  Social Connections: Moderately Integrated (09/27/2021)   Social Connection and Isolation Panel [NHANES]    Frequency of Communication with Friends and Family: Three times a week    Frequency of Social Gatherings with Friends and Family: Three times a week    Attends Religious Services: 1 to 4 times per year    Active Member of  Clubs or Organizations: No    Attends Banker Meetings: 1 to 4 times per year    Marital Status: Divorced  Intimate Partner Violence: Not At Risk (09/27/2021)   Humiliation, Afraid, Rape, and Kick questionnaire    Fear of Current or Ex-Partner: No    Emotionally Abused: No    Physically Abused: No    Sexually Abused: No    Past Surgical History:  Procedure Laterality Date   CESAREAN SECTION  01/23/1979   IR IMAGING GUIDED PORT INSERTION  08/09/2021   TONSILLECTOMY  03/17/1956    Family History  Problem Relation Age of Onset   Breast cancer Mother 38   Cancer Mother    Cancer Father    Lung cancer Father    Breast cancer Cousin        dx 56s maternal    Allergies  Allergen Reactions   Doxycycline Hives    Thrush and mouth sores   Aspirin Rash    Childhood reaction        Latest Ref Rng & Units 09/19/2022    9:37 AM 08/29/2022   10:06 AM 08/08/2022    9:50 AM  CBC  WBC 4.0 - 10.5 K/uL 5.8  8.8  5.2   Hemoglobin 12.0 - 15.0 g/dL 16.1  09.6  04.5   Hematocrit 36.0 - 46.0 % 34.6  36.1  34.5   Platelets 150 - 400 K/uL 264  297  258       CMP     Component Value Date/Time   NA 140 09/19/2022 0937   K 4.3 09/19/2022 0937   CL 109 09/19/2022 0937   CO2 21 (L) 09/19/2022 0937   GLUCOSE 117 (H) 09/19/2022 0937   BUN 36 (H) 09/19/2022 0937   CREATININE 1.04 (H) 09/19/2022 0937   CREATININE 1.03 (H) 07/02/2022 1259   CALCIUM 9.9 09/19/2022 0937   PROT 7.1 09/19/2022 0937   ALBUMIN 4.0 09/19/2022 0937   AST 19 09/19/2022 0937   AST 24 07/02/2022 1259   ALT 17 09/19/2022 0937   ALT 17 07/02/2022 1259   ALKPHOS 55 09/19/2022 0937   BILITOT 0.2 (L) 09/19/2022 0937   BILITOT 0.3 07/02/2022 1259   GFRNONAA 57 (L) 09/19/2022 0937   GFRNONAA 58 (L) 07/02/2022 1259     No results found.     Assessment & Plan:   1. Lymphedema Today the patient's lower extremity edema is doing very good with continued conservative therapy.  She will continue to wear  medical grade compression socks, elevation and activity for  control of her symptoms.  Following discussion she feels like her swelling is well-controlled and she does not need the use of a lymphedema pump at this time.  Following this discussion the patient will follow-up with [place] on an as-needed basis.  2. Essential hypertension Continue antihypertensive medications as already ordered, these medications have been reviewed and there are no changes at this time.   Current Outpatient Medications on File Prior to Visit  Medication Sig Dispense Refill   acetaminophen (TYLENOL) 650 MG CR tablet Take 1,300 mg by mouth every 8 (eight) hours as needed for pain.     albuterol (VENTOLIN HFA) 108 (90 Base) MCG/ACT inhaler Inhale 2 puffs into the lungs every 6 (six) hours as needed for wheezing or shortness of breath.     azelastine (ASTELIN) 0.1 % nasal spray Place 2 sprays into both nostrils daily. Use in each nostril as directed 30 mL 0   Cranberry-Vitamin C-Probiotic (AZO CRANBERRY PO) Take by mouth.     docusate sodium (COLACE) 100 MG capsule Take 1 capsule (100 mg total) by mouth 2 (two) times daily. 60 capsule 1   empagliflozin (JARDIANCE) 25 MG TABS tablet Take 25 mg by mouth daily.     ferrous sulfate 325 (65 FE) MG EC tablet Take 325 mg by mouth daily with breakfast.     FLUoxetine (PROZAC) 10 MG capsule Take 10 mg by mouth daily.     fluticasone (FLONASE) 50 MCG/ACT nasal spray Place 1 spray into both nostrils daily as needed for allergies or rhinitis.     hydrochlorothiazide (HYDRODIURIL) 12.5 MG tablet Take 12.5 mg by mouth daily.     hydrocortisone 2.5 % cream Apply topically 3 (three) times daily. 30 g 2   ibuprofen (ADVIL) 200 MG tablet Take 400 mg by mouth every 8 (eight) hours as needed for moderate pain.     lidocaine-prilocaine (EMLA) cream Apply to affected area once 30 g 3   lisinopril (ZESTRIL) 40 MG tablet Take 40 mg by mouth at bedtime.     loratadine (CLARITIN) 10 MG tablet Take by  mouth.     LORazepam (ATIVAN) 0.5 MG tablet Take 1 tablet (0.5 mg total) by mouth every 8 (eight) hours as needed for anxiety (nausea vomiting). 30 tablet 0   metFORMIN (GLUCOPHAGE-XR) 500 MG 24 hr tablet Take 1,000 mg by mouth at bedtime.     metoprolol succinate (TOPROL-XL) 25 MG 24 hr tablet Take 25 mg by mouth daily.     mupirocin ointment (BACTROBAN) 2 % Apply 1 Application topically 3 (three) times daily. 22 g 0   NOVOLIN 70/30 FLEXPEN (70-30) 100 UNIT/ML KwikPen 50-110 Units See admin instructions. 50 units in the morning, 110 units at bedtime     ondansetron (ZOFRAN) 8 MG tablet Take 1 tablet (8 mg total) by mouth 2 (two) times daily as needed. Start on the third day after chemotherapy. 30 tablet 1   OZEMPIC, 1 MG/DOSE, 4 MG/3ML SOPN Inject 1 mg into the skin every Tuesday.     potassium chloride SA (KLOR-CON M) 20 MEQ tablet Take 1 tablet (20 mEq total) by mouth daily. 3 tablet 0   prochlorperazine (COMPAZINE) 10 MG tablet Take 1 tablet (10 mg total) by mouth every 6 (six) hours as needed (Nausea or vomiting). 30 tablet 1   traMADol (ULTRAM) 50 MG tablet Take 2 tablets (100 mg total) by mouth every 6 (six) hours as needed. 90 tablet 0   triamcinolone ointment (KENALOG) 0.5 % Apply 1 Application  topically 2 (two) times daily. 30 g 0   Current Facility-Administered Medications on File Prior to Visit  Medication Dose Route Frequency Provider Last Rate Last Admin   heparin lock flush 100 UNIT/ML injection             There are no Patient Instructions on file for this visit. No follow-ups on file.   Georgiana Spinner, NP

## 2022-10-10 ENCOUNTER — Inpatient Hospital Stay: Payer: Medicare PPO

## 2022-10-10 ENCOUNTER — Encounter: Payer: Self-pay | Admitting: Oncology

## 2022-10-10 ENCOUNTER — Inpatient Hospital Stay: Payer: Medicare PPO | Admitting: Oncology

## 2022-10-10 VITALS — BP 108/75 | HR 102 | Resp 18 | Ht 68.0 in | Wt 280.0 lb

## 2022-10-10 DIAGNOSIS — C541 Malignant neoplasm of endometrium: Secondary | ICD-10-CM

## 2022-10-10 DIAGNOSIS — Z5112 Encounter for antineoplastic immunotherapy: Secondary | ICD-10-CM | POA: Diagnosis not present

## 2022-10-10 LAB — COMPREHENSIVE METABOLIC PANEL
ALT: 17 U/L (ref 0–44)
AST: 20 U/L (ref 15–41)
Albumin: 3.8 g/dL (ref 3.5–5.0)
Alkaline Phosphatase: 53 U/L (ref 38–126)
Anion gap: 10 (ref 5–15)
BUN: 34 mg/dL — ABNORMAL HIGH (ref 8–23)
CO2: 20 mmol/L — ABNORMAL LOW (ref 22–32)
Calcium: 9.6 mg/dL (ref 8.9–10.3)
Chloride: 109 mmol/L (ref 98–111)
Creatinine, Ser: 0.99 mg/dL (ref 0.44–1.00)
GFR, Estimated: >60 mL/min (ref >60)
Glucose, Bld: 168 mg/dL — ABNORMAL HIGH (ref 70–99)
Potassium: 4 mmol/L (ref 3.5–5.1)
Sodium: 139 mmol/L (ref 135–145)
Total Bilirubin: 0.3 mg/dL (ref 0.3–1.2)
Total Protein: 7.1 g/dL (ref 6.5–8.1)

## 2022-10-10 LAB — CBC WITH DIFFERENTIAL/PLATELET
Abs Immature Granulocytes: 0.03 10*3/uL (ref 0.00–0.07)
Basophils Absolute: 0 10*3/uL (ref 0.0–0.1)
Basophils Relative: 0 %
Eosinophils Absolute: 0.3 10*3/uL (ref 0.0–0.5)
Eosinophils Relative: 4 %
HCT: 34.9 % — ABNORMAL LOW (ref 36.0–46.0)
Hemoglobin: 11.4 g/dL — ABNORMAL LOW (ref 12.0–15.0)
Immature Granulocytes: 0 %
Lymphocytes Relative: 11 %
Lymphs Abs: 0.8 10*3/uL (ref 0.7–4.0)
MCH: 31.1 pg (ref 26.0–34.0)
MCHC: 32.7 g/dL (ref 30.0–36.0)
MCV: 95.1 fL (ref 80.0–100.0)
Monocytes Absolute: 0.6 10*3/uL (ref 0.1–1.0)
Monocytes Relative: 9 %
Neutro Abs: 5.2 10*3/uL (ref 1.7–7.7)
Neutrophils Relative %: 76 %
Platelets: 264 10*3/uL (ref 150–400)
RBC: 3.67 MIL/uL — ABNORMAL LOW (ref 3.87–5.11)
RDW: 13.6 % (ref 11.5–15.5)
WBC: 6.9 10*3/uL (ref 4.0–10.5)
nRBC: 0 % (ref 0.0–0.2)

## 2022-10-10 MED ORDER — SODIUM CHLORIDE 0.9 % IV SOLN
Freq: Once | INTRAVENOUS | Status: AC
  Filled 2022-10-10: qty 250

## 2022-10-10 MED ORDER — SODIUM CHLORIDE 0.9 % IV SOLN
200.0000 mg | Freq: Once | INTRAVENOUS | Status: AC
  Administered 2022-10-10: 200 mg via INTRAVENOUS
  Filled 2022-10-10: qty 8

## 2022-10-10 MED ORDER — HEPARIN SOD (PORK) LOCK FLUSH 100 UNIT/ML IV SOLN
500.0000 [IU] | Freq: Once | INTRAVENOUS | Status: AC | PRN
  Administered 2022-10-10: 500 [IU]
  Filled 2022-10-10: qty 5

## 2022-10-10 NOTE — Progress Notes (Signed)
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 s/p carboplatin and Taxol for 5 cycles, on Keytruda maintenance.  Labs are reviewed and discussed with patient. Proceed with Keytruda CT showed NED/stable disease, discussed with    Orders Placed This Encounter  Procedures   CBC with Differential    Standing Status:   Future    Standing Expiration Date:   11/21/2023   Comprehensive metabolic panel    Standing Status:   Future    Standing Expiration Date:   11/21/2023   TSH    Standing Status:   Future    Standing Expiration Date:   11/21/2023   CBC with Differential    Standing Status:   Future    Standing Expiration Date:   12/12/2023   Comprehensive metabolic panel    Standing Status:   Future    Standing Expiration Date:   12/12/2023   TSH    Standing Status:   Future    Standing Expiration Date:   12/12/2023    Return of visit:  3 weeks Lab MD Greggory Brandy, MD, [name] [facility] Hematology Oncology 10/10/2022     HISTORY OF PRESENTING ILLNESS:   Valerie Case is a  72 y.o.  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 Oncology History  Endometrial adenocarcinoma (HCC)  12/12/2020 Imaging   12/12/2020, CT abdomen pelvis with contrast showed no radiographic evidence of urinary tract neoplasm, calculi, hydronephrosis.  2.6 cm nonspecific left adrenal mass. 02/12/2021 CT hematuria work-up showed small uterine fibroids, no findings to  explain hematuria.Hepatomegaly, diverticulosis without evidence of diverticulitis, Coronary artery disease   04/01/2021 -  Hospital Admission   04/01/2021 - 04/03/2021, patient was hospitalized due to symptomatic anemia, hemoglobin 6.9, heavy postmenopausal bleeding with passing large clots.  She also presented with increased creatinine level to 2.58 compared to her baseline level of 0.9 in November 2022.  Patient received IV iron infusion and 2 units of PRBC during the hospital stay..  04/02/2021, iron panel showed iron saturation 37, ferritin 37, TIBC 333-the studies were done after patient received blood transfusion.  At discharge, hemoglobin was 7.7.   04/01/2021 Initial Diagnosis   04/01/2020, endometrial biopsy showed poorly differentiated endometrial adenocarcinoma. Omniseq NGS showed TMB 48.4 mt/mb [high], MSI High, PD-L1-TPS <1%, PIK3CA H1047Q, Negative for BRAF, HER2, NTRK1 RET.     04/10/2021 Cancer Staging   Staging form: Corpus Uteri - Carcinoma and Carcinosarcoma, AJCC 8th Edition - Clinical stage from 04/10/2021: FIGO Stage III, calculated as Stage Unknown (cT3, cNX, cM0) - Signed by Rickard Patience, MD on 08/24/2021 Stage prefix: Initial diagnosis   04/15/2021 Imaging   PET showed 1. Large hypermetabolic endometrial mass consistent with known endometrial cancer. Suspect direct invasion/involvement of the right adnexa. Right pelvic sidewall hypermetabolic adenopathy.2. 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 3. No findings for metastatic disease involving the chest or bonystructures.     04/24/2021 - 05/29/2021 Radiation Therapy   status post  radiation to pelvis   08/05/2021 Imaging   MRI abdomen w wo contrast 1. Stable solid enhancing 2.7 cm left adrenal lesion, which was hypermetabolic on prior PET/CTs but is unchanged in size dating back to December 12, 2020. While the imaging characteristics are  again nonspecific, given its relative stability since September 2022 and the relative rarity of isolated adrenal metastases this is favored to reflect a lipid poor adenoma. However, unfortunately metastatic disease can not be entirely excluded and remains a pertinent differential consideration. Comparison with more remote prior imaging would be the most valuable tool in the assessment of this lesion, as demonstrating long-term stability would indicate this to be a benign lesion. However, if no prior imaging can be made available, would consider follow-up adrenal protocol CT with and without intravenous contrast material in 3 months as this would allow for further assessment of stability as well as enhancement and washout characteristics of the lesion potentially allowing for better characterization versus direct tissue sampling.2. Hepatomegaly and hepatic steatosis.3. Colonic diverticulosis without findings of acute diverticulitis    08/16/2021 - 09/27/2021 Chemotherapy   UTERINE Carboplatin AUC 5 / Paclitaxel q21d x 3      10/03/2021 Imaging   PET 1. No residual hypermetabolism in the uterus suggesting an excellent response to treatment. No findings for metastatic disease. 2. Resolution of hypermetabolism in the left adrenal gland lesion suggesting this was metastatic disease. 3. Diffuse marrow hypermetabolism likely due to rebound from chemotherapy or marrow stimulating drugs   10/18/2021 - 11/08/2021 Chemotherapy   AUC 5 / Paclitaxel/ Keytruda Q21d  x 2 cycles   11/29/2021 -  Chemotherapy   Patient is on Treatment Plan : UTERINE Pembrolizumab (200) q21d     04/30/2022 Imaging   CT chest abdomen pelvis w contrast  1. Similar to minimal decrease in size of a left adrenal nodule which was felt suspicious for metastatic disease on 10/03/2021 PET. 2. No evidence of new or progressive disease. 3. Coronary artery atherosclerosis. Aortic Atherosclerosis (ICD10-I70.0). 4. Incidental findings, including:  Hepatomegaly. Possible constipation.   08/28/2022 Imaging   CT chest abdomen pelvis w contrast showed 1. Similar appearance of indeterminate left adrenal nodule. 2. Otherwise, no evidence of metastatic disease. 3. Hepatomegaly 4.  Possible constipation. 5. Coronary artery atherosclerosis. Aortic Atherosclerosis (ICD10-I70.0).       INTERVAL HISTORY SRI BODIFORD is a 72 y.o. female who has above history reviewed by me today presents for follow up visit for anemia and endometrial cancer Overall she tolerates treatment, slightly more SOB when she is outdoor, due to hot weather and high humidity.  Patient denies nausea vomiting diarrhea.   Review of Systems  Constitutional:  Positive for fatigue. Negative for chills and fever.  HENT:   Negative for hearing loss and voice change.        Nasal congestion, sneezes  Eyes:  Negative for eye problems.  Respiratory:  Negative for chest tightness and cough.   Cardiovascular:  Negative for chest pain.  Gastrointestinal:  Negative for abdominal distention, abdominal pain and blood in stool.  Endocrine: Negative for hot flashes.  Genitourinary:  Negative for difficulty urinating, frequency and vaginal bleeding.   Musculoskeletal:  Positive for arthralgias.  Skin:  Positive for rash. Negative for itching.  Neurological:  Positive for headaches. Negative for extremity weakness.  Hematological:  Negative for adenopathy.  Psychiatric/Behavioral:  Negative for confusion.     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    SURGICAL HISTORY: Past Surgical History:  Procedure Laterality Date   CESAREAN SECTION  01/23/1979   IR IMAGING GUIDED PORT INSERTION  08/09/2021    TONSILLECTOMY  03/17/1956    SOCIAL HISTORY: Social History   Socioeconomic History   Marital status: Divorced    Spouse name: Not on file   Number of children: Not on file   Years of education: Not on file   Highest education level: Not on file  Occupational History   Not on file  Tobacco Use   Smoking status: Never   Smokeless tobacco: Never  Vaping Use   Vaping status: Never Used  Substance and Sexual Activity   Alcohol use: Not Currently   Drug use: Never   Sexual activity: Not Currently  Other Topics Concern   Not on file  Social History Narrative      Social Determinants of Health   Financial Resource Strain: Low Risk  (08/12/2022)   Received from [facility], [facility]   Overall Financial Resource Strain (CARDIA)    Difficulty of Paying Living Expenses: Not hard at all  Food Insecurity: No Food Insecurity (08/12/2022)   Received from [facility], [facility]   Hunger Vital Sign    Worried About Running Out of Food in the Last Year: Never true    Ran Out of Food in the Last Year: Never true  Transportation Needs: No Transportation Needs (08/12/2022)   Received from [facility], [facility]   [facility] - Transportation    In the past 12 months, has lack of transportation kept you from medical appointments or from getting medications?: No    Lack of Transportation (Non-Medical): No  Physical Activity: Inactive (09/27/2021)   Exercise Vital Sign    Days of Exercise per Week: 0 days    Minutes of Exercise per Session: 0 min  Stress: Stress Concern Present (09/27/2021)   [employer] of Occupational Health - Occupational Stress Questionnaire    Feeling of Stress : Rather much  Social Connections: Moderately Integrated (09/27/2021)   Social Connection and Isolation Panel [NHANES]    Frequency of Communication with Friends and Family: Three times a week     Frequency of Social Gatherings with Friends and Family: Three times a week    Attends Religious Services: 1 to 4 times per year    Active Member of Clubs or Organizations: No    Attends Banker Meetings: 1 to 4 times per year    Marital Status: Divorced  Intimate Partner Violence: Not At Risk (09/27/2021)   Humiliation, Afraid, Rape, and Kick questionnaire    Fear of Current or Ex-Partner: No    Emotionally Abused: No    Physically Abused: No    Sexually Abused: No    FAMILY HISTORY: Family History  Problem Relation Age of Onset   Breast cancer Mother 68   Cancer Mother    Cancer Father    Lung cancer Father    Breast cancer Cousin        dx 84s maternal    ALLERGIES:  is allergic to doxycycline and aspirin.  MEDICATIONS:  Current Outpatient Medications  Medication Sig Dispense Refill   acetaminophen (  TYLENOL) 650 MG CR tablet Take 1,300 mg by mouth every 8 (eight) hours as needed for pain.     albuterol (VENTOLIN HFA) 108 (90 Base) MCG/ACT inhaler Inhale 2 puffs into the lungs every 6 (six) hours as needed for wheezing or shortness of breath.     azelastine (ASTELIN) 0.1 % nasal spray Place 2 sprays into both nostrils daily. Use in each nostril as directed 30 mL 0   Cranberry-Vitamin C-Probiotic (AZO CRANBERRY PO) Take by mouth.     docusate sodium (COLACE) 100 MG capsule Take 1 capsule (100 mg total) by mouth 2 (two) times daily. 60 capsule 1   empagliflozin (JARDIANCE) 25 MG TABS tablet Take 25 mg by mouth daily.     ferrous sulfate 325 (65 FE) MG EC tablet Take 325 mg by mouth daily with breakfast.     FLUoxetine (PROZAC) 10 MG capsule Take 10 mg by mouth daily.     fluticasone (FLONASE) 50 MCG/ACT nasal spray Place 1 spray into both nostrils daily as needed for allergies or rhinitis.     hydrochlorothiazide (HYDRODIURIL) 12.5 MG tablet Take 12.5 mg by mouth daily.     hydrocortisone 2.5 % cream Apply topically 3 (three) times daily. 30 g 2   ibuprofen (ADVIL)  200 MG tablet Take 400 mg by mouth every 8 (eight) hours as needed for moderate pain.     Insulin Asp Prot & Asp FlexPen (NOVOLOG 70/30 MIX) (70-30) 100 UNIT/ML FlexPen Inject into the skin.     lidocaine-prilocaine (EMLA) cream Apply to affected area once 30 g 3   lisinopril (ZESTRIL) 40 MG tablet Take 40 mg by mouth at bedtime.     loratadine (CLARITIN) 10 MG tablet Take by mouth.     LORazepam (ATIVAN) 0.5 MG tablet Take 1 tablet (0.5 mg total) by mouth every 8 (eight) hours as needed for anxiety (nausea vomiting). 30 tablet 0   metFORMIN (GLUCOPHAGE-XR) 500 MG 24 hr tablet Take 1,000 mg by mouth at bedtime.     metoprolol succinate (TOPROL-XL) 25 MG 24 hr tablet Take 25 mg by mouth daily.     mupirocin ointment (BACTROBAN) 2 % Apply 1 Application topically 3 (three) times daily. 22 g 0   NOVOLIN 70/30 FLEXPEN (70-30) 100 UNIT/ML KwikPen 50-110 Units See admin instructions. 50 units in the morning, 110 units at bedtime     OZEMPIC, 1 MG/DOSE, 4 MG/3ML SOPN Inject 1 mg into the skin every Tuesday.     potassium chloride SA (KLOR-CON M) 20 MEQ tablet Take 1 tablet (20 mEq total) by mouth daily. 3 tablet 0   traMADol (ULTRAM) 50 MG tablet Take 2 tablets (100 mg total) by mouth every 6 (six) hours as needed. 90 tablet 0   triamcinolone ointment (KENALOG) 0.5 % Apply 1 Application topically 2 (two) times daily. 30 g 0   ondansetron (ZOFRAN) 8 MG tablet Take 1 tablet (8 mg total) by mouth 2 (two) times daily as needed. Start on the third day after chemotherapy. (Patient not taking: Reported on 10/10/2022) 30 tablet 1   prochlorperazine (COMPAZINE) 10 MG tablet Take 1 tablet (10 mg total) by mouth every 6 (six) hours as needed (Nausea or vomiting). (Patient not taking: Reported on 10/10/2022) 30 tablet 1   No current facility-administered medications for this visit.   Facility-Administered Medications Ordered in Other Visits  Medication Dose Route Frequency Provider Last Rate Last Admin   heparin lock  flush 100 UNIT/ML injection  PHYSICAL EXAMINATION: ECOG PERFORMANCE STATUS: 1 - Symptomatic but completely ambulatory  Physical Exam Constitutional:      General: She is not in acute distress.    Appearance: She is obese.  HENT:     Head: Normocephalic and atraumatic.  Eyes:     General: No scleral icterus. Cardiovascular:     Rate and Rhythm: Normal rate.  Pulmonary:     Effort: Pulmonary effort is normal. No respiratory distress.     Breath sounds: No wheezing.  Abdominal:     General: Bowel sounds are normal. There is no distension.     Palpations: Abdomen is soft.  Musculoskeletal:        General: No deformity. Normal range of motion.     Cervical back: Normal range of motion.     Right lower leg: Edema present.     Left lower leg: Edema present.     Comments:    Skin:    General: Skin is warm and dry.     Findings: Rash present.     Comments: Chronic venous sufficiency skin changes  Neurological:     Mental Status: She is alert and oriented to person, place, and time. Mental status is at baseline.     Cranial Nerves: No cranial nerve deficit.     Coordination: Coordination normal.  Psychiatric:        Mood and Affect: Mood normal.    LABORATORY DATA:  I have reviewed the data as listed    Latest Ref Rng & Units 10/10/2022    9:03 AM 09/19/2022    9:37 AM 08/29/2022   10:06 AM  CBC  WBC 4.0 - 10.5 K/uL 6.9  5.8  8.8   Hemoglobin 12.0 - 15.0 g/dL 95.6  21.3  08.6   Hematocrit 36.0 - 46.0 % 34.9  34.6  36.1   Platelets 150 - 400 K/uL 264  264  297       Latest Ref Rng & Units 10/10/2022    9:03 AM 09/19/2022    9:37 AM 08/29/2022   10:06 AM  CMP  Glucose 70 - 99 mg/dL 578  469  629   BUN 8 - 23 mg/dL 34  36  35   Creatinine 0.44 - 1.00 mg/dL 5.28  4.13  2.44   Sodium 135 - 145 mmol/L 139  140  138   Potassium 3.5 - 5.1 mmol/L 4.0  4.3  4.2   Chloride 98 - 111 mmol/L 109  109  107   CO2 22 - 32 mmol/L 20  21  20    Calcium 8.9 - 10.3 mg/dL 9.6   9.9  9.7   Total Protein 6.5 - 8.1 g/dL 7.1  7.1  7.3   Total Bilirubin 0.3 - 1.2 mg/dL 0.3  0.2  0.4   Alkaline Phos 38 - 126 U/L 53  55  66   AST 15 - 41 U/L 20  19  20    ALT 0 - 44 U/L 17  17  16        RADIOGRAPHIC STUDIES: I have personally reviewed the radiological images as listed and agreed with the findings in the report. CT CHEST ABDOMEN PELVIS WO CONTRAST  Result Date: 08/28/2022 CLINICAL DATA:  Endometrial cancer diagnosed in January of 2023. Chemotherapy and radiation therapy in 2023. Currently on immunotherapy. * Tracking Code: BO * EXAM: CT CHEST, ABDOMEN AND PELVIS WITHOUT CONTRAST TECHNIQUE: Multidetector CT imaging of the chest, abdomen and pelvis was performed following the standard  protocol without IV contrast. RADIATION DOSE REDUCTION: This exam was performed according to the departmental dose-optimization program which includes automated exposure control, adjustment of the mA and/or kV according to patient size and/or use of iterative reconstruction technique. COMPARISON:  04/30/2022 FINDINGS: CT CHEST FINDINGS Cardiovascular: Right Port-A-Cath tip high right atrium. Aortic atherosclerosis. Borderline cardiomegaly. Three vessel coronary artery calcification. Mediastinum/Nodes: No supraclavicular adenopathy. No mediastinal or hilar adenopathy, given limitations of unenhanced CT. Lungs/Pleura: No pleural fluid. Minimal biapical pleuroparenchymal scarring. 3 mm left lower lobe pulmonary nodule on 88/3 is felt to be similar to on the prior exam. Present back to 01/22/2022, favoring a benign etiology. Musculoskeletal: No acute osseous abnormality. Subcutaneous nodules about the posterior right and midline chest wall including at 11 mm on 04/02 and 1.0 cm on 07/02. Relatively similar to the prior PET of 04/15/2021, favoring sebaceous cyst. CT ABDOMEN PELVIS FINDINGS Hepatobiliary: Hepatomegaly. Segment 4A 3.1 cm hepatic cyst again identified. Normal gallbladder, without biliary ductal  dilatation. Pancreas: Normal, without mass or ductal dilatation. Spleen: Normal in size, without focal abnormality. Adrenals/Urinary Tract: Normal right adrenal gland and kidneys. Left adrenal nodule measures 2.8 by 2.4 cm on 58/2 versus 2.9 x 2.2 cm on the prior exam (when remeasured). No hydroureter or ureteric calculi.  No bladder calculi. Stomach/Bowel: Normal stomach, without wall thickening. Transverse duodenal diverticulum. Otherwise normal small bowel. Extensive colonic diverticulosis. Colonic stool burden suggests constipation. Normal terminal ileum. Vascular/Lymphatic: Aortic atherosclerosis. No abdominopelvic adenopathy. Reproductive: No well-defined uterine mass.  No adnexal mass. Other: No significant free fluid. Mild pelvic floor laxity. No evidence of omental or peritoneal disease. Musculoskeletal: No acute osseous abnormality. Trace L4-5 anterolisthesis with advanced degenerative disc disease at this level. IMPRESSION: 1. Similar appearance of indeterminate left adrenal nodule. 2. Otherwise, no evidence of metastatic disease. 3. Hepatomegaly 4.  Possible constipation. 5. Coronary artery atherosclerosis. Aortic Atherosclerosis (ICD10-I70.0). Electronically Signed   By: Jeronimo Greaves M.D.   On: 08/28/2022 09:10

## 2022-10-10 NOTE — Patient Instructions (Signed)
[facility]  Discharge Instructions: Thank you for choosing [facility] to provide your oncology and hematology care.  If you have a lab appointment with the [facility], please go directly to the [facility] and check in at the registration area.  Wear comfortable clothing and clothing appropriate for easy access to any Portacath or PICC line.   We strive to give you quality time with your provider. You may need to reschedule your appointment if you arrive late (15 or more minutes).  Arriving late affects you and other patients whose appointments are after yours.  Also, if you miss three or more appointments without notifying the office, you may be dismissed from the clinic at the provider's discretion.      For prescription refill requests, have your pharmacy contact our office and allow 72 hours for refills to be completed.    Today you received the following chemotherapy and/or immunotherapy agents: KEYTRUDA     To help prevent nausea and vomiting after your treatment, we encourage you to take your nausea medication as directed.  BELOW ARE SYMPTOMS THAT SHOULD BE REPORTED IMMEDIATELY: *FEVER GREATER THAN 100.4 F (38 C) OR HIGHER *CHILLS OR SWEATING *NAUSEA AND VOMITING THAT IS NOT CONTROLLED WITH YOUR NAUSEA MEDICATION *UNUSUAL SHORTNESS OF BREATH *UNUSUAL BRUISING OR BLEEDING *URINARY PROBLEMS (pain or burning when urinating, or frequent urination) *BOWEL PROBLEMS (unusual diarrhea, constipation, pain near the anus) TENDERNESS IN MOUTH AND THROAT WITH OR WITHOUT PRESENCE OF ULCERS (sore throat, sores in mouth, or a toothache) UNUSUAL RASH, SWELLING OR PAIN  UNUSUAL VAGINAL DISCHARGE OR ITCHING   Items with * indicate a potential emergency and should be followed up as soon as possible or go to the Emergency Department if any problems should occur.  Please show the CHEMOTHERAPY ALERT CARD or IMMUNOTHERAPY ALERT CARD at check-in to  the Emergency Department and triage nurse.  Should you have questions after your visit or need to cancel or reschedule your appointment, please contact [facility]  [phone] and follow the prompts.  Office hours are 8:00 a.m. to 4:30 p.m. Monday - Friday. Please note that voicemails left after 4:00 p.m. may not be returned until the following business day.  We are closed weekends and major holidays. You have access to a nurse at all times for urgent questions. Please call the main number to the clinic [phone] and follow the prompts.  For any non-urgent questions, you may also contact your provider using MyChart. We now offer e-Visits for anyone 48 and older to request care online for non-urgent symptoms. For details visit [email or web address].   Also download the MyChart app! Go to the app store, search "MyChart", open the app, select [facility], and log in with your MyChart username and password.

## 2022-10-10 NOTE — Assessment & Plan Note (Signed)
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 s/p carboplatin and Taxol for 5 cycles, on Keytruda maintenance.  Labs are reviewed and discussed with patient. Proceed with Keytruda CT showed NED/stable disease, discussed with

## 2022-10-31 ENCOUNTER — Inpatient Hospital Stay: Payer: Medicare PPO

## 2022-10-31 ENCOUNTER — Encounter: Payer: Self-pay | Admitting: Oncology

## 2022-10-31 ENCOUNTER — Inpatient Hospital Stay: Payer: Medicare PPO | Attending: Obstetrics and Gynecology | Admitting: Oncology

## 2022-10-31 VITALS — BP 125/76 | HR 106 | Temp 98.6°F | Resp 18 | Wt 285.2 lb

## 2022-10-31 DIAGNOSIS — Z5112 Encounter for antineoplastic immunotherapy: Secondary | ICD-10-CM | POA: Insufficient documentation

## 2022-10-31 DIAGNOSIS — Z886 Allergy status to analgesic agent status: Secondary | ICD-10-CM | POA: Insufficient documentation

## 2022-10-31 DIAGNOSIS — K760 Fatty (change of) liver, not elsewhere classified: Secondary | ICD-10-CM | POA: Diagnosis not present

## 2022-10-31 DIAGNOSIS — Z79620 Long term (current) use of immunosuppressive biologic: Secondary | ICD-10-CM | POA: Insufficient documentation

## 2022-10-31 DIAGNOSIS — Z881 Allergy status to other antibiotic agents status: Secondary | ICD-10-CM | POA: Diagnosis not present

## 2022-10-31 DIAGNOSIS — C541 Malignant neoplasm of endometrium: Secondary | ICD-10-CM

## 2022-10-31 DIAGNOSIS — I10 Essential (primary) hypertension: Secondary | ICD-10-CM | POA: Diagnosis not present

## 2022-10-31 DIAGNOSIS — E278 Other specified disorders of adrenal gland: Secondary | ICD-10-CM | POA: Insufficient documentation

## 2022-10-31 DIAGNOSIS — Z79899 Other long term (current) drug therapy: Secondary | ICD-10-CM | POA: Diagnosis not present

## 2022-10-31 DIAGNOSIS — R5383 Other fatigue: Secondary | ICD-10-CM | POA: Insufficient documentation

## 2022-10-31 DIAGNOSIS — Z86018 Personal history of other benign neoplasm: Secondary | ICD-10-CM | POA: Diagnosis not present

## 2022-10-31 DIAGNOSIS — R519 Headache, unspecified: Secondary | ICD-10-CM | POA: Diagnosis not present

## 2022-10-31 DIAGNOSIS — K5730 Diverticulosis of large intestine without perforation or abscess without bleeding: Secondary | ICD-10-CM | POA: Diagnosis not present

## 2022-10-31 DIAGNOSIS — E119 Type 2 diabetes mellitus without complications: Secondary | ICD-10-CM | POA: Insufficient documentation

## 2022-10-31 DIAGNOSIS — I700 Atherosclerosis of aorta: Secondary | ICD-10-CM | POA: Diagnosis not present

## 2022-10-31 DIAGNOSIS — Z9221 Personal history of antineoplastic chemotherapy: Secondary | ICD-10-CM | POA: Diagnosis not present

## 2022-10-31 DIAGNOSIS — R160 Hepatomegaly, not elsewhere classified: Secondary | ICD-10-CM | POA: Insufficient documentation

## 2022-10-31 DIAGNOSIS — K7689 Other specified diseases of liver: Secondary | ICD-10-CM | POA: Insufficient documentation

## 2022-10-31 DIAGNOSIS — Z923 Personal history of irradiation: Secondary | ICD-10-CM | POA: Insufficient documentation

## 2022-10-31 DIAGNOSIS — M4316 Spondylolisthesis, lumbar region: Secondary | ICD-10-CM | POA: Diagnosis not present

## 2022-10-31 DIAGNOSIS — M5136 Other intervertebral disc degeneration, lumbar region: Secondary | ICD-10-CM | POA: Diagnosis not present

## 2022-10-31 DIAGNOSIS — M2550 Pain in unspecified joint: Secondary | ICD-10-CM | POA: Diagnosis not present

## 2022-10-31 DIAGNOSIS — Z9089 Acquired absence of other organs: Secondary | ICD-10-CM | POA: Insufficient documentation

## 2022-10-31 DIAGNOSIS — Z801 Family history of malignant neoplasm of trachea, bronchus and lung: Secondary | ICD-10-CM | POA: Insufficient documentation

## 2022-10-31 DIAGNOSIS — I2510 Atherosclerotic heart disease of native coronary artery without angina pectoris: Secondary | ICD-10-CM | POA: Diagnosis not present

## 2022-10-31 DIAGNOSIS — Z803 Family history of malignant neoplasm of breast: Secondary | ICD-10-CM | POA: Insufficient documentation

## 2022-10-31 DIAGNOSIS — D649 Anemia, unspecified: Secondary | ICD-10-CM | POA: Insufficient documentation

## 2022-10-31 LAB — CBC WITH DIFFERENTIAL/PLATELET
Abs Immature Granulocytes: 0.06 10*3/uL (ref 0.00–0.07)
Basophils Absolute: 0 10*3/uL (ref 0.0–0.1)
Basophils Relative: 1 %
Eosinophils Absolute: 0.3 10*3/uL (ref 0.0–0.5)
Eosinophils Relative: 4 %
HCT: 35.7 % — ABNORMAL LOW (ref 36.0–46.0)
Hemoglobin: 11.2 g/dL — ABNORMAL LOW (ref 12.0–15.0)
Immature Granulocytes: 1 %
Lymphocytes Relative: 19 %
Lymphs Abs: 1.2 10*3/uL (ref 0.7–4.0)
MCH: 30.8 pg (ref 26.0–34.0)
MCHC: 31.4 g/dL (ref 30.0–36.0)
MCV: 98.1 fL (ref 80.0–100.0)
Monocytes Absolute: 0.6 10*3/uL (ref 0.1–1.0)
Monocytes Relative: 9 %
Neutro Abs: 4.3 10*3/uL (ref 1.7–7.7)
Neutrophils Relative %: 66 %
Platelets: 270 10*3/uL (ref 150–400)
RBC: 3.64 MIL/uL — ABNORMAL LOW (ref 3.87–5.11)
RDW: 13.5 % (ref 11.5–15.5)
WBC: 6.5 10*3/uL (ref 4.0–10.5)
nRBC: 0 % (ref 0.0–0.2)

## 2022-10-31 LAB — COMPREHENSIVE METABOLIC PANEL
ALT: 18 U/L (ref 0–44)
AST: 21 U/L (ref 15–41)
Albumin: 3.9 g/dL (ref 3.5–5.0)
Alkaline Phosphatase: 56 U/L (ref 38–126)
Anion gap: 10 (ref 5–15)
BUN: 33 mg/dL — ABNORMAL HIGH (ref 8–23)
CO2: 18 mmol/L — ABNORMAL LOW (ref 22–32)
Calcium: 9.2 mg/dL (ref 8.9–10.3)
Chloride: 109 mmol/L (ref 98–111)
Creatinine, Ser: 0.97 mg/dL (ref 0.44–1.00)
GFR, Estimated: >60 mL/min (ref >60)
Glucose, Bld: 189 mg/dL — ABNORMAL HIGH (ref 70–99)
Potassium: 4.1 mmol/L (ref 3.5–5.1)
Sodium: 137 mmol/L (ref 135–145)
Total Bilirubin: <0.1 mg/dL — ABNORMAL LOW (ref 0.3–1.2)
Total Protein: 7.1 g/dL (ref 6.5–8.1)

## 2022-10-31 MED ORDER — SODIUM CHLORIDE 0.9 % IV SOLN
200.0000 mg | Freq: Once | INTRAVENOUS | Status: AC
  Administered 2022-10-31: 200 mg via INTRAVENOUS
  Filled 2022-10-31: qty 8

## 2022-10-31 MED ORDER — HEPARIN SOD (PORK) LOCK FLUSH 100 UNIT/ML IV SOLN
500.0000 [IU] | Freq: Once | INTRAVENOUS | Status: AC | PRN
  Administered 2022-10-31: 500 [IU]
  Filled 2022-10-31: qty 5

## 2022-10-31 MED ORDER — SODIUM CHLORIDE 0.9 % IV SOLN
Freq: Once | INTRAVENOUS | Status: AC
  Filled 2022-10-31: qty 250

## 2022-10-31 NOTE — Assessment & Plan Note (Signed)
lipid poor hyperfunctioning adenoma vs mets.  FDG avid on PET scan.  Difficult biopsy due to patient's body habitus and deep position of the mass. Resolved on PET scan, suggesting that this lesion might be a metastatic lesion. Continue monitor  

## 2022-10-31 NOTE — Assessment & Plan Note (Addendum)
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 s/p carboplatin and Taxol for 5 cycles, on Keytruda maintenance.  Labs are reviewed and discussed with patient. Proceed with Keytruda Plan repeat CT in early Oct

## 2022-10-31 NOTE — Assessment & Plan Note (Signed)
Treatment plan as listed above. 

## 2022-10-31 NOTE — Progress Notes (Signed)
Hematology/Oncology Progress note [phone] [phone]      CHIEF COMPLAINTS/REASON FOR VISIT:  Follow up for endometrial cancer  ASSESSMENT & PLAN:   Cancer Staging  Endometrial adenocarcinoma 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 s/p carboplatin and Taxol for 5 cycles, on Keytruda maintenance.  Labs are reviewed and discussed with patient. Proceed with Keytruda Plan repeat CT in early Oct    Adrenal mass (HCC) lipid poor hyperfunctioning adenoma vs mets.  FDG avid on PET scan.  Difficult biopsy due to patient's body habitus and deep position of the mass. Resolved on PET scan, suggesting that this lesion might be a metastatic lesion. Continue monitor   Encounter for antineoplastic immunotherapy Treatment plan as listed above.    No orders of the defined types were placed in this encounter.   Return of visit:  3 weeks Lab MD Greggory Brandy, MD, [name] [facility] Hematology Oncology 10/31/2022     HISTORY OF PRESENTING ILLNESS:   Valerie Case is a  72 y.o.  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 Oncology History  Endometrial adenocarcinoma (HCC)  12/12/2020 Imaging   12/12/2020, CT abdomen pelvis with contrast showed no radiographic evidence of urinary tract neoplasm, calculi, hydronephrosis.  2.6 cm nonspecific left adrenal mass. 02/12/2021 CT hematuria work-up showed small uterine fibroids, no findings to explain hematuria.Hepatomegaly, diverticulosis without evidence of diverticulitis, Coronary artery disease   04/01/2021 -  Hospital Admission   04/01/2021 - 04/03/2021, patient was hospitalized due to  symptomatic anemia, hemoglobin 6.9, heavy postmenopausal bleeding with passing large clots.  She also presented with increased creatinine level to 2.58 compared to her baseline level of 0.9 in November 2022.  Patient received IV iron infusion and 2 units of PRBC during the hospital stay..  04/02/2021, iron panel showed iron saturation 37, ferritin 37, TIBC 333-the studies were done after patient received blood transfusion.  At discharge, hemoglobin was 7.7.   04/01/2021 Initial Diagnosis   04/01/2020, endometrial biopsy showed poorly differentiated endometrial adenocarcinoma. Omniseq NGS showed TMB 48.4 mt/mb [high], MSI High, PD-L1-TPS <1%, PIK3CA H1047Q, Negative for BRAF, HER2, NTRK1 RET.     04/10/2021 Cancer Staging   Staging form: Corpus Uteri - Carcinoma and Carcinosarcoma, AJCC 8th Edition - Clinical stage from 04/10/2021: FIGO Stage III, calculated as Stage Unknown (cT3, cNX, cM0) - Signed by Rickard Patience, MD on 08/24/2021 Stage prefix: Initial diagnosis   04/15/2021 Imaging   PET showed 1. Large hypermetabolic endometrial mass consistent with known endometrial cancer. Suspect direct invasion/involvement of the right adnexa. Right pelvic sidewall hypermetabolic adenopathy.2. 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 3. No findings for metastatic disease involving the chest or bonystructures.     04/24/2021 - 05/29/2021 Radiation Therapy   status post radiation to pelvis   08/05/2021 Imaging   MRI abdomen w wo contrast 1. Stable solid enhancing 2.7 cm left adrenal lesion, which was hypermetabolic on prior PET/CTs but is unchanged in size dating back to December 12, 2020. While the imaging characteristics are again nonspecific, given its relative stability since September 2022 and the relative rarity of isolated adrenal metastases this is favored to  reflect a lipid poor adenoma. However, unfortunately  metastatic disease can not be entirely excluded and remains a pertinent differential consideration. Comparison with more remote prior imaging would be the most valuable tool in the assessment of this lesion, as demonstrating long-term stability would indicate this to be a benign lesion. However, if no prior imaging can be made available, would consider follow-up adrenal protocol CT with and without intravenous contrast material in 3 months as this would allow for further assessment of stability as well as enhancement and washout characteristics of the lesion potentially allowing for better characterization versus direct tissue sampling.2. Hepatomegaly and hepatic steatosis.3. Colonic diverticulosis without findings of acute diverticulitis    08/16/2021 - 09/27/2021 Chemotherapy   UTERINE Carboplatin AUC 5 / Paclitaxel q21d x 3      10/03/2021 Imaging   PET 1. No residual hypermetabolism in the uterus suggesting an excellent response to treatment. No findings for metastatic disease. 2. Resolution of hypermetabolism in the left adrenal gland lesion suggesting this was metastatic disease. 3. Diffuse marrow hypermetabolism likely due to rebound from chemotherapy or marrow stimulating drugs   10/18/2021 - 11/08/2021 Chemotherapy   AUC 5 / Paclitaxel/ Keytruda Q21d  x 2 cycles   11/29/2021 -  Chemotherapy   Patient is on Treatment Plan : UTERINE Pembrolizumab (200) q21d     04/30/2022 Imaging   CT chest abdomen pelvis w contrast  1. Similar to minimal decrease in size of a left adrenal nodule which was felt suspicious for metastatic disease on 10/03/2021 PET. 2. No evidence of new or progressive disease. 3. Coronary artery atherosclerosis. Aortic Atherosclerosis (ICD10-I70.0). 4. Incidental findings, including: Hepatomegaly. Possible constipation.   08/28/2022 Imaging   CT chest abdomen pelvis w contrast showed 1. Similar appearance of indeterminate left adrenal nodule. 2. Otherwise, no evidence of  metastatic disease. 3. Hepatomegaly 4.  Possible constipation. 5. Coronary artery atherosclerosis. Aortic Atherosclerosis (ICD10-I70.0).       INTERVAL HISTORY TMYA PIZZOFERRATO is a 72 y.o. female who has above history reviewed by me today presents for follow up visit for anemia and endometrial cancer Overall she tolerates treatment, slightly more SOB when she is outdoor, due to hot weather and high humidity.  Patient denies nausea vomiting diarrhea.   Review of Systems  Constitutional:  Positive for fatigue. Negative for chills and fever.  HENT:   Negative for hearing loss and voice change.        Nasal congestion, sneezes  Eyes:  Negative for eye problems.  Respiratory:  Negative for chest tightness and cough.   Cardiovascular:  Negative for chest pain.  Gastrointestinal:  Negative for abdominal distention, abdominal pain and blood in stool.  Endocrine: Negative for hot flashes.  Genitourinary:  Negative for difficulty urinating, frequency and vaginal bleeding.   Musculoskeletal:  Positive for arthralgias.  Skin:  Positive for rash. Negative for itching.  Neurological:  Positive for headaches. Negative for extremity weakness.  Hematological:  Negative for adenopathy.  Psychiatric/Behavioral:  Negative for confusion.     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    SURGICAL HISTORY: Past Surgical History:  Procedure Laterality Date   CESAREAN SECTION  01/23/1979   IR IMAGING GUIDED PORT  INSERTION  08/09/2021   TONSILLECTOMY  03/17/1956    SOCIAL HISTORY: Social History   Socioeconomic History   Marital status: Divorced    Spouse name: Not on file   Number of children: Not on file   Years of  education: Not on file   Highest education level: Not on file  Occupational History   Not on file  Tobacco Use   Smoking status: Never   Smokeless tobacco: Never  Vaping Use   Vaping status: Never Used  Substance and Sexual Activity   Alcohol use: Not Currently   Drug use: Never   Sexual activity: Not Currently  Other Topics Concern   Not on file  Social History Narrative      Social Determinants of Health   Financial Resource Strain: Low Risk  (08/12/2022)   Received from [facility], [facility]   Overall Financial Resource Strain (CARDIA)    Difficulty of Paying Living Expenses: Not hard at all  Food Insecurity: No Food Insecurity (08/12/2022)   Received from [facility], [facility]   Hunger Vital Sign    Worried About Running Out of Food in the Last Year: Never true    Ran Out of Food in the Last Year: Never true  Transportation Needs: No Transportation Needs (08/12/2022)   Received from [facility], [facility]   [facility] - Transportation    In the past 12 months, has lack of transportation kept you from medical appointments or from getting medications?: No    Lack of Transportation (Non-Medical): No  Physical Activity: Inactive (09/27/2021)   Exercise Vital Sign    Days of Exercise per Week: 0 days    Minutes of Exercise per Session: 0 min  Stress: Stress Concern Present (09/27/2021)   [employer] of Occupational Health - Occupational Stress Questionnaire    Feeling of Stress : Rather much  Social Connections: Moderately Integrated (09/27/2021)   Social Connection and Isolation Panel [NHANES]    Frequency of Communication with Friends and Family: Three times a week    Frequency of Social Gatherings with Friends and Family: Three times a week    Attends Religious Services: 1 to 4 times per year    Active Member of Clubs or Organizations: No    Attends Tax inspector Meetings: 1 to 4 times per year    Marital Status: Divorced  Intimate Partner Violence: Not At Risk (09/27/2021)   Humiliation, Afraid, Rape, and Kick questionnaire    Fear of Current or Ex-Partner: No    Emotionally Abused: No    Physically Abused: No    Sexually Abused: No    FAMILY HISTORY: Family History  Problem Relation Age of Onset   Breast cancer Mother 34   Cancer Mother    Cancer Father    Lung cancer Father    Breast cancer Cousin        dx 2s maternal    ALLERGIES:  is allergic to doxycycline and aspirin.  MEDICATIONS:  Current Outpatient Medications  Medication Sig Dispense Refill   acetaminophen (TYLENOL) 650 MG CR tablet Take 1,300 mg by mouth every 8 (eight) hours as needed for pain.     albuterol (VENTOLIN HFA) 108 (90 Base) MCG/ACT inhaler Inhale 2 puffs into the lungs every 6 (six) hours as needed for wheezing or shortness of breath.     azelastine (ASTELIN) 0.1 % nasal spray Place 2 sprays into both  nostrils daily. Use in each nostril as directed 30 mL 0   Cranberry-Vitamin C-Probiotic (AZO CRANBERRY PO) Take by mouth.     docusate sodium (COLACE) 100 MG capsule Take 1 capsule (100 mg total) by mouth 2 (two) times daily. 60 capsule 1   empagliflozin (JARDIANCE) 25 MG TABS tablet Take 25 mg by mouth daily.     ferrous sulfate 325 (65 FE) MG EC tablet Take 325 mg by mouth daily with breakfast.     FLUoxetine (PROZAC) 10 MG capsule Take 10 mg by mouth daily.     fluticasone (FLONASE) 50 MCG/ACT nasal spray Place 1 spray into both nostrils daily as needed for allergies or rhinitis.     hydrochlorothiazide (HYDRODIURIL) 12.5 MG tablet Take 12.5 mg by mouth daily.     hydrocortisone 2.5 % cream Apply topically 3 (three) times daily. 30 g 2   ibuprofen (ADVIL) 200 MG tablet Take 400 mg by mouth every 8 (eight) hours as needed for moderate pain.     Insulin Asp Prot & Asp FlexPen (NOVOLOG 70/30 MIX) (70-30) 100 UNIT/ML FlexPen Inject into the skin.      lidocaine-prilocaine (EMLA) cream Apply to affected area once 30 g 3   lisinopril (ZESTRIL) 40 MG tablet Take 40 mg by mouth at bedtime.     loratadine (CLARITIN) 10 MG tablet Take by mouth.     LORazepam (ATIVAN) 0.5 MG tablet Take 1 tablet (0.5 mg total) by mouth every 8 (eight) hours as needed for anxiety (nausea vomiting). 30 tablet 0   metFORMIN (GLUCOPHAGE-XR) 500 MG 24 hr tablet Take 1,000 mg by mouth at bedtime.     metoprolol succinate (TOPROL-XL) 25 MG 24 hr tablet Take 25 mg by mouth daily.     mupirocin ointment (BACTROBAN) 2 % Apply 1 Application topically 3 (three) times daily. 22 g 0   NOVOLIN 70/30 FLEXPEN (70-30) 100 UNIT/ML KwikPen 50-110 Units See admin instructions. 50 units in the morning, 110 units at bedtime     OZEMPIC, 1 MG/DOSE, 4 MG/3ML SOPN Inject 1 mg into the skin every Tuesday.     potassium chloride SA (KLOR-CON M) 20 MEQ tablet Take 1 tablet (20 mEq total) by mouth daily. 3 tablet 0   traMADol (ULTRAM) 50 MG tablet Take 2 tablets (100 mg total) by mouth every 6 (six) hours as needed. 90 tablet 0   triamcinolone ointment (KENALOG) 0.5 % Apply 1 Application topically 2 (two) times daily. 30 g 0   ondansetron (ZOFRAN) 8 MG tablet Take 1 tablet (8 mg total) by mouth 2 (two) times daily as needed. Start on the third day after chemotherapy. (Patient not taking: Reported on 10/10/2022) 30 tablet 1   prochlorperazine (COMPAZINE) 10 MG tablet Take 1 tablet (10 mg total) by mouth every 6 (six) hours as needed (Nausea or vomiting). (Patient not taking: Reported on 10/10/2022) 30 tablet 1   No current facility-administered medications for this visit.   Facility-Administered Medications Ordered in Other Visits  Medication Dose Route Frequency Provider Last Rate Last Admin   heparin lock flush 100 UNIT/ML injection            heparin lock flush 100 unit/mL  500 Units Intracatheter Once PRN Rickard Patience, MD       pembrolizumab [facility]) 200 mg in sodium chloride 0.9 % 50 mL chemo  infusion  200 mg Intravenous Once Rickard Patience, MD         PHYSICAL EXAMINATION: ECOG PERFORMANCE STATUS: 1 - Symptomatic but  completely ambulatory  Physical Exam Constitutional:      General: She is not in acute distress.    Appearance: She is obese.  HENT:     Head: Normocephalic and atraumatic.  Eyes:     General: No scleral icterus. Cardiovascular:     Rate and Rhythm: Normal rate.  Pulmonary:     Effort: Pulmonary effort is normal. No respiratory distress.     Breath sounds: No wheezing.  Abdominal:     General: Bowel sounds are normal. There is no distension.     Palpations: Abdomen is soft.  Musculoskeletal:        General: No deformity. Normal range of motion.     Cervical back: Normal range of motion.     Right lower leg: Edema present.     Left lower leg: Edema present.     Comments:    Skin:    General: Skin is warm and dry.     Findings: Rash present.     Comments: Chronic venous sufficiency skin changes  Neurological:     Mental Status: She is alert and oriented to person, place, and time. Mental status is at baseline.     Cranial Nerves: No cranial nerve deficit.     Coordination: Coordination normal.  Psychiatric:        Mood and Affect: Mood normal.    LABORATORY DATA:  I have reviewed the data as listed    Latest Ref Rng & Units 10/31/2022    8:53 AM 10/10/2022    9:03 AM 09/19/2022    9:37 AM  CBC  WBC 4.0 - 10.5 K/uL 6.5  6.9  5.8   Hemoglobin 12.0 - 15.0 g/dL 34.7  42.5  95.6   Hematocrit 36.0 - 46.0 % 35.7  34.9  34.6   Platelets 150 - 400 K/uL 270  264  264       Latest Ref Rng & Units 10/31/2022    8:53 AM 10/10/2022    9:03 AM 09/19/2022    9:37 AM  CMP  Glucose 70 - 99 mg/dL 387  564  332   BUN 8 - 23 mg/dL 33  34  36   Creatinine 0.44 - 1.00 mg/dL 9.51  8.84  1.66   Sodium 135 - 145 mmol/L 137  139  140   Potassium 3.5 - 5.1 mmol/L 4.1  4.0  4.3   Chloride 98 - 111 mmol/L 109  109  109   CO2 22 - 32 mmol/L 18  20  21    Calcium 8.9 -  10.3 mg/dL 9.2  9.6  9.9   Total Protein 6.5 - 8.1 g/dL 7.1  7.1  7.1   Total Bilirubin 0.3 - 1.2 mg/dL <0.6  0.3  0.2   Alkaline Phos 38 - 126 U/L 56  53  55   AST 15 - 41 U/L 21  20  19    ALT 0 - 44 U/L 18  17  17        RADIOGRAPHIC STUDIES: I have personally reviewed the radiological images as listed and agreed with the findings in the report. CT CHEST ABDOMEN PELVIS WO CONTRAST  Result Date: 08/28/2022 CLINICAL DATA:  Endometrial cancer diagnosed in January of 2023. Chemotherapy and radiation therapy in 2023. Currently on immunotherapy. * Tracking Code: BO * EXAM: CT CHEST, ABDOMEN AND PELVIS WITHOUT CONTRAST TECHNIQUE: Multidetector CT imaging of the chest, abdomen and pelvis was performed following the standard protocol without IV contrast. RADIATION DOSE REDUCTION:  This exam was performed according to the departmental dose-optimization program which includes automated exposure control, adjustment of the mA and/or kV according to patient size and/or use of iterative reconstruction technique. COMPARISON:  04/30/2022 FINDINGS: CT CHEST FINDINGS Cardiovascular: Right Port-A-Cath tip high right atrium. Aortic atherosclerosis. Borderline cardiomegaly. Three vessel coronary artery calcification. Mediastinum/Nodes: No supraclavicular adenopathy. No mediastinal or hilar adenopathy, given limitations of unenhanced CT. Lungs/Pleura: No pleural fluid. Minimal biapical pleuroparenchymal scarring. 3 mm left lower lobe pulmonary nodule on 88/3 is felt to be similar to on the prior exam. Present back to 01/22/2022, favoring a benign etiology. Musculoskeletal: No acute osseous abnormality. Subcutaneous nodules about the posterior right and midline chest wall including at 11 mm on 04/02 and 1.0 cm on 07/02. Relatively similar to the prior PET of 04/15/2021, favoring sebaceous cyst. CT ABDOMEN PELVIS FINDINGS Hepatobiliary: Hepatomegaly. Segment 4A 3.1 cm hepatic cyst again identified. Normal gallbladder, without  biliary ductal dilatation. Pancreas: Normal, without mass or ductal dilatation. Spleen: Normal in size, without focal abnormality. Adrenals/Urinary Tract: Normal right adrenal gland and kidneys. Left adrenal nodule measures 2.8 by 2.4 cm on 58/2 versus 2.9 x 2.2 cm on the prior exam (when remeasured). No hydroureter or ureteric calculi.  No bladder calculi. Stomach/Bowel: Normal stomach, without wall thickening. Transverse duodenal diverticulum. Otherwise normal small bowel. Extensive colonic diverticulosis. Colonic stool burden suggests constipation. Normal terminal ileum. Vascular/Lymphatic: Aortic atherosclerosis. No abdominopelvic adenopathy. Reproductive: No well-defined uterine mass.  No adnexal mass. Other: No significant free fluid. Mild pelvic floor laxity. No evidence of omental or peritoneal disease. Musculoskeletal: No acute osseous abnormality. Trace L4-5 anterolisthesis with advanced degenerative disc disease at this level. IMPRESSION: 1. Similar appearance of indeterminate left adrenal nodule. 2. Otherwise, no evidence of metastatic disease. 3. Hepatomegaly 4.  Possible constipation. 5. Coronary artery atherosclerosis. Aortic Atherosclerosis (ICD10-I70.0). Electronically Signed   By: Jeronimo Greaves M.D.   On: 08/28/2022 09:10

## 2022-10-31 NOTE — Patient Instructions (Signed)
[facility]  Discharge Instructions: Thank you for choosing [facility] to provide your oncology and hematology care.  If you have a lab appointment with the Cancer Center, please go directly to the Cancer Center and check in at the registration area.  Wear comfortable clothing and clothing appropriate for easy access to any Portacath or PICC line.   We strive to give you quality time with your provider. You may need to reschedule your appointment if you arrive late (15 or more minutes).  Arriving late affects you and other patients whose appointments are after yours.  Also, if you miss three or more appointments without notifying the office, you may be dismissed from the clinic at the provider's discretion.      For prescription refill requests, have your pharmacy contact our office and allow 72 hours for refills to be completed.    Today you received the following chemotherapy and/or immunotherapy agents- keytruda      To help prevent nausea and vomiting after your treatment, we encourage you to take your nausea medication as directed.  BELOW ARE SYMPTOMS THAT SHOULD BE REPORTED IMMEDIATELY: *FEVER GREATER THAN 100.4 F (38 C) OR HIGHER *CHILLS OR SWEATING *NAUSEA AND VOMITING THAT IS NOT CONTROLLED WITH YOUR NAUSEA MEDICATION *UNUSUAL SHORTNESS OF BREATH *UNUSUAL BRUISING OR BLEEDING *URINARY PROBLEMS (pain or burning when urinating, or frequent urination) *BOWEL PROBLEMS (unusual diarrhea, constipation, pain near the anus) TENDERNESS IN MOUTH AND THROAT WITH OR WITHOUT PRESENCE OF ULCERS (sore throat, sores in mouth, or a toothache) UNUSUAL RASH, SWELLING OR PAIN  UNUSUAL VAGINAL DISCHARGE OR ITCHING   Items with * indicate a potential emergency and should be followed up as soon as possible or go to the Emergency Department if any problems should occur.  Please show the CHEMOTHERAPY ALERT CARD or IMMUNOTHERAPY ALERT CARD at check-in to  the Emergency Department and triage nurse.  Should you have questions after your visit or need to cancel or reschedule your appointment, please contact [facility]  [phone] and follow the prompts.  Office hours are 8:00 a.m. to 4:30 p.m. Monday - Friday. Please note that voicemails left after 4:00 p.m. may not be returned until the following business day.  We are closed weekends and major holidays. You have access to a nurse at all times for urgent questions. Please call the main number to the clinic [phone] and follow the prompts.  For any non-urgent questions, you may also contact your provider using MyChart. We now offer e-Visits for anyone 18 and older to request care online for non-urgent symptoms. For details visit [email or web address].   Also download the MyChart app! Go to the app store, search "MyChart", open the app, select , and log in with your MyChart username and password.    

## 2022-11-19 ENCOUNTER — Inpatient Hospital Stay: Payer: Medicare PPO | Attending: Obstetrics and Gynecology | Admitting: Obstetrics and Gynecology

## 2022-11-19 ENCOUNTER — Encounter: Payer: Self-pay | Admitting: Obstetrics and Gynecology

## 2022-11-19 VITALS — BP 124/72 | HR 100 | Temp 97.3°F | Resp 20 | Wt 283.5 lb

## 2022-11-19 DIAGNOSIS — D631 Anemia in chronic kidney disease: Secondary | ICD-10-CM | POA: Diagnosis not present

## 2022-11-19 DIAGNOSIS — Z86018 Personal history of other benign neoplasm: Secondary | ICD-10-CM | POA: Insufficient documentation

## 2022-11-19 DIAGNOSIS — I129 Hypertensive chronic kidney disease with stage 1 through stage 4 chronic kidney disease, or unspecified chronic kidney disease: Secondary | ICD-10-CM | POA: Diagnosis not present

## 2022-11-19 DIAGNOSIS — Z5112 Encounter for antineoplastic immunotherapy: Secondary | ICD-10-CM | POA: Insufficient documentation

## 2022-11-19 DIAGNOSIS — Z79899 Other long term (current) drug therapy: Secondary | ICD-10-CM | POA: Diagnosis not present

## 2022-11-19 DIAGNOSIS — I2510 Atherosclerotic heart disease of native coronary artery without angina pectoris: Secondary | ICD-10-CM | POA: Diagnosis not present

## 2022-11-19 DIAGNOSIS — E669 Obesity, unspecified: Secondary | ICD-10-CM | POA: Insufficient documentation

## 2022-11-19 DIAGNOSIS — Z809 Family history of malignant neoplasm, unspecified: Secondary | ICD-10-CM | POA: Insufficient documentation

## 2022-11-19 DIAGNOSIS — C541 Malignant neoplasm of endometrium: Secondary | ICD-10-CM | POA: Diagnosis not present

## 2022-11-19 DIAGNOSIS — Z881 Allergy status to other antibiotic agents status: Secondary | ICD-10-CM | POA: Insufficient documentation

## 2022-11-19 DIAGNOSIS — M4316 Spondylolisthesis, lumbar region: Secondary | ICD-10-CM | POA: Diagnosis not present

## 2022-11-19 DIAGNOSIS — Z79620 Long term (current) use of immunosuppressive biologic: Secondary | ICD-10-CM | POA: Diagnosis not present

## 2022-11-19 DIAGNOSIS — R519 Headache, unspecified: Secondary | ICD-10-CM | POA: Insufficient documentation

## 2022-11-19 DIAGNOSIS — D500 Iron deficiency anemia secondary to blood loss (chronic): Secondary | ICD-10-CM | POA: Diagnosis not present

## 2022-11-19 DIAGNOSIS — Z23 Encounter for immunization: Secondary | ICD-10-CM | POA: Insufficient documentation

## 2022-11-19 DIAGNOSIS — Z803 Family history of malignant neoplasm of breast: Secondary | ICD-10-CM | POA: Insufficient documentation

## 2022-11-19 DIAGNOSIS — R160 Hepatomegaly, not elsewhere classified: Secondary | ICD-10-CM | POA: Diagnosis not present

## 2022-11-19 DIAGNOSIS — R609 Edema, unspecified: Secondary | ICD-10-CM | POA: Diagnosis not present

## 2022-11-19 DIAGNOSIS — B3731 Acute candidiasis of vulva and vagina: Secondary | ICD-10-CM | POA: Insufficient documentation

## 2022-11-19 DIAGNOSIS — Z9089 Acquired absence of other organs: Secondary | ICD-10-CM | POA: Insufficient documentation

## 2022-11-19 DIAGNOSIS — Z6841 Body Mass Index (BMI) 40.0 and over, adult: Secondary | ICD-10-CM | POA: Insufficient documentation

## 2022-11-19 DIAGNOSIS — Z923 Personal history of irradiation: Secondary | ICD-10-CM | POA: Insufficient documentation

## 2022-11-19 DIAGNOSIS — G629 Polyneuropathy, unspecified: Secondary | ICD-10-CM | POA: Insufficient documentation

## 2022-11-19 DIAGNOSIS — E279 Disorder of adrenal gland, unspecified: Secondary | ICD-10-CM | POA: Diagnosis not present

## 2022-11-19 DIAGNOSIS — R599 Enlarged lymph nodes, unspecified: Secondary | ICD-10-CM | POA: Diagnosis not present

## 2022-11-19 DIAGNOSIS — N189 Chronic kidney disease, unspecified: Secondary | ICD-10-CM | POA: Insufficient documentation

## 2022-11-19 DIAGNOSIS — I700 Atherosclerosis of aorta: Secondary | ICD-10-CM | POA: Diagnosis not present

## 2022-11-19 DIAGNOSIS — M2550 Pain in unspecified joint: Secondary | ICD-10-CM | POA: Diagnosis not present

## 2022-11-19 DIAGNOSIS — R5383 Other fatigue: Secondary | ICD-10-CM | POA: Diagnosis not present

## 2022-11-19 DIAGNOSIS — Z886 Allergy status to analgesic agent status: Secondary | ICD-10-CM | POA: Insufficient documentation

## 2022-11-19 DIAGNOSIS — Z801 Family history of malignant neoplasm of trachea, bronchus and lung: Secondary | ICD-10-CM | POA: Insufficient documentation

## 2022-11-19 MED ORDER — FLUCONAZOLE 150 MG PO TABS: 150.0000 mg | ORAL_TABLET | Freq: Every day | ORAL | 0 refills | Status: DC

## 2022-11-19 NOTE — Progress Notes (Signed)
Gynecologic Oncology Interval Visit   Referring Provider: Dr. Jean Rosenthal  Chief Complaint: FIGO grade III endometrial adenocarcinoma  Subjective:  Valerie Case is a 72 y.o. female who is seen in consultation from Dr. Jean Rosenthal for poorly differentiated endometrial adenocarcinoma, FIGO grade III, s/p radiation x 5 weeks to control bleeding, felt to be poor surgical candidate, received chemotherapy who presents to clinic for follow up.   She is currently on maintenance pembrolizumab.  She saw Dr. Cathie Hoops on 10/31/2022. Her last imaging study was a CT scan on 08/22/2022. IMPRESSION: 1. Similar appearance of indeterminate left adrenal nodule. 2. Otherwise, no evidence of metastatic disease. 3. Hepatomegaly 4.  Possible constipation. 5. Coronary artery atherosclerosis. Aortic Atherosclerosis  She feels that she may have a yeast infection.   Gynecologic Oncology History Valerie Case is a pleasant female who is seen in consultation from Dr. Jean Rosenthal for poorly differentiated endometrial adenocarcinoma, figo grade III. Please see prior notes for complete detail  04/01/21 She was admitted  to [facility] with vaginal bleeding and received 2 units pRBCs and iron infusion. She underwent endometrial biopsy on which revealed poorly differentiated endometrial adenocarcinoma FIGO III  04/02/2021 BILATERAL LOWER EXTREMITY VENOUS DOPPLER ULTRASOUND No evidence of deep venous thrombosis in either lower extremity.  04/16/2021 PET IMPRESSION: 1. Large hypermetabolic endometrial mass consistent with known endometrial cancer. Suspect direct invasion/involvement of the right adnexa. Right pelvic sidewall hypermetabolic adenopathy. 2. 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 3. No findings for metastatic disease involving the chest or bony structures.  04/24/2021 Tumor Board Documentation Patient to undergo radiation  therapy. Plan to reassess after radiation with imaging to re-evaluate possible surgical intervention.     04/24/2021 - 05/29/2021: She completed WPRT Dose/Fx (Gy): 1.8 #Fx: 25 / 25. Total Dose (Gy): 45  06/12/2021 PET IMPRESSION: 1. Substantial reduction in size and activity of the uterine mass. Substantial reduction in size and activity of the right pelvic sidewall lymph node. 2. The moderately hypermetabolic left adrenal mass is stable in size back through earliest available compare comparison of 12/12/2020, with roughly similar activity level to previous. The density characteristics are not specific for adrenal adenoma. Possibilities include benign and malignant adrenal neoplasms versus metastatic lesion. 3. Other imaging findings of potential clinical significance: Left mastoid effusion. Coronary and aortic atherosclerosis. Systemic atherosclerosis. Mild cardiomegaly. Lax anterior abdominal wall. Suspected cholelithiasis. Sigmoid colon diverticulosis. Congenital anomalies of the C1 vertebra. Anterolisthesis at L4-5.  Hypermetabolic left adrenal mass has been worked up for possible pheochromocytoma which was negative. She was referred to urology and saw Dr. Lonna Cobb who recommended CT guided biopsy but this was refused by IR.   She has been followed by Dr. Cathie Hoops for iron deficiency anemia secondary to blood loss in setting of endometrial cancer. She received IV iron and palliative radiation. Bleeding has stopped and hemoglobin has improved to 11.4. Blood sugars have been elevated and last HmgA1c was 7.9 (07/18/21)  08/05/2021 Imaging    MRI abdomen w wo contrast 1. Stable solid enhancing 2.7 cm left adrenal lesion, which was hypermetabolic on prior PET/CTs but is unchanged in size dating back to December 12, 2020. While the imaging characteristics are again nonspecific, given its relative stability since September 2022 and the relative rarity of isolated adrenal metastases this is favored to reflect a  lipid poor adenoma. However, unfortunately metastatic disease can not be entirely excluded and remains a pertinent differential consideration. Comparison with more remote  prior imaging would be the most valuable tool in the assessment of this lesion, as demonstrating long-term stability would indicate this to be a benign lesion. However, if no prior imaging can be made available, would consider follow-up adrenal protocol CT with and without intravenous contrast material in 3 months as this would allow for further assessment of stability as well as enhancement and washout characteristics of the lesion potentially allowing for better characterization versus direct tissue sampling.2. Hepatomegaly and hepatic steatosis.3. Colonic diverticulosis without findings of acute diverticulitis      Her case was discussed at Tumor Board. Dr. Johnnette Litter felt like she would be a candidate for surgery. After further discussion she opted for non-surgical management.    08/16/2021 - 09/27/2021  completed  3 cycles of carbo-paclitaxel chemotherapy.   PET 10/03/21  - no residual hypermetabolism of the uterus suggestive of excellent response to treatment. No findings of metastatic disease. Resolution of hypermetabolism of left adrenal lesions. Diffuse marrow hypermetabolism likely secondary to udenyca/GCSF vs chemotherapy.   10/18/2021 - 11/08/2021 completed  2 cycles of carbo-paclitaxel chemotherapy.  11/29/2021 started on pembrolizumab 200 mg every 3 weeks.   01/22/2022 CT C/A/P IMPRESSION: 1. No findings of progressive metastatic disease within the chest, abdomen, or pelvis. 2. Left adrenal nodule is similar in size to 10/03/2021 PET. This was felt to be suspicious for treated metastasis on prior PET. 3. Hepatomegaly 4. Coronary artery atherosclerosis. Aortic Atherosclerosis (ICD10-I70.0). 5.  Tiny hiatal hernia.   04/30/2022 Imaging  CT C/A/P    CT chest abdomen pelvis w contrast  1. Similar to minimal decrease in size  of a left adrenal nodule which was felt suspicious for metastatic disease on 10/03/2021 PET. 2. No evidence of new or progressive disease. 3. Coronary artery atherosclerosis. Aortic Atherosclerosis (ICD10-I70.0). 4. Incidental findings, including: Hepatomegaly. Possible constipation.   08/28/2022 CT C/A/P IMPRESSION: 1. Similar appearance of indeterminate left adrenal nodule. 2. Otherwise, no evidence of metastatic disease. 3. Hepatomegaly 4.  Possible constipation. 5. Coronary artery atherosclerosis. Aortic Atherosclerosis  GENETIC TESTING: Genetic counseling performed, patient declined testing  Tumor markers 08/20/21- Omn[employer] NGS showed TMB 48.4 mt/mb [high], MSI High, PD-L1-TPS <1%, PIK3CA H1047Q, Negative for BRAF, HER2, NTRK1 RET. MMR was not reported.   .        Problem List: Patient Active Problem List   Diagnosis Date Noted   Allergy 08/08/2022   Skin rash 06/06/2022   Encounter for antineoplastic immunotherapy 12/20/2021   Cancer of endometrium (HCC) 11/28/2021   Encounter for antineoplastic chemotherapy 08/24/2021   Morbid obesity with body mass index (BMI) of 40.0 to 44.9 in adult (HCC) 08/24/2021   Anemia in chronic kidney disease (CKD) 08/24/2021   Drug-induced constipation 08/23/2021   Port-A-Cath in place 08/15/2021   Venous insufficiency of both lower extremities 05/20/2021   Hepatomegaly 04/14/2021   Endometrial adenocarcinoma (HCC) 04/14/2021   Adrenal mass (HCC) 04/14/2021   Goals of care, counseling/discussion 04/14/2021   B12 deficiency 04/11/2021   AKI (acute kidney injury) (HCC) 04/01/2021   Essential hypertension 04/01/2021   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   Depression 04/01/2021   Recent unintentional weight loss over several months 04/01/2021   History of asthma 04/01/2021   Bilateral lower extremity edema 04/01/2021     Past Medical History: Past Medical History:  Diagnosis Date   Asthma    Cancer (HCC)     Depression 04/01/2021   Diabetes mellitus without complication (HCC)    Essential hypertension 04/01/2021   Hx  of dysplastic nevus 07/16/2010   RLQA   Hypertension    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    Past Surgical History: Past Surgical History:  Procedure Laterality Date   CESAREAN SECTION  01/23/1979   IR IMAGING GUIDED PORT INSERTION  08/09/2021   TONSILLECTOMY  03/17/1956    Past Gynecologic History:  Post menopausal - 2006 STD: denies   OB History:  OB History  Gravida Para Term Preterm AB Living  1 1 1     1   SAB IAB Ectopic Multiple Live Births          1    # Outcome Date GA Lbr Len/2nd Weight Sex Type Anes PTL Lv  1 Term 1980     CS-Unspec   LIV    Family History: Family History  Problem Relation Age of Onset   Breast cancer Mother 65   Cancer Mother    Cancer Father    Lung cancer Father    Breast cancer Cousin        dx 33s maternal    Social History: Social History   Socioeconomic History   Marital status: Divorced    Spouse name: Not on file   Number of children: Not on file   Years of education: Not on file   Highest education level: Not on file  Occupational History   Not on file  Tobacco Use   Smoking status: Never   Smokeless tobacco: Never  Vaping Use   Vaping status: Never Used  Substance and Sexual Activity   Alcohol use: Not Currently   Drug use: Never   Sexual activity: Not Currently  Other Topics Concern   Not on file  Social History Narrative      Social Determinants of Health   Financial Resource Strain: Low Risk  (08/12/2022)   Received from [facility], [facility]   Overall Financial Resource Strain (CARDIA)    Difficulty of Paying Living Expenses: Not hard at all  Food Insecurity: No Food Insecurity (08/12/2022)   Received from [facility], [facility]   Hunger Vital Sign    Worried About Running Out of Food in  the Last Year: Never true    Ran Out of Food in the Last Year: Never true  Transportation Needs: No Transportation Needs (08/12/2022)   Received from [facility], [facility]   [facility] - Transportation    In the past 12 months, has lack of transportation kept you from medical appointments or from getting medications?: No    Lack of Transportation (Non-Medical): No  Physical Activity: Inactive (09/27/2021)   Exercise Vital Sign    Days of Exercise per Week: 0 days    Minutes of Exercise per Session: 0 min  Stress: Stress Concern Present (09/27/2021)   [employer] of Occupational [employer] - Occupational Stress Questionnaire    Feeling of Stress : Rather much  Social Connections: Moderately Integrated (09/27/2021)   Social Connection and Isolation Panel [NHANES]    Frequency of Communication with Friends and Family: Three times a week    Frequency of Social Gatherings with Friends and Family: Three times a week    Attends Religious Services: 1 to 4 times per year    Active Member of Clubs or Organizations: No    Attends Banker Meetings: 1 to 4 times per year    Marital Status: Divorced  Catering manager Violence: Not At  Risk (09/27/2021)   Humiliation, Afraid, Rape, and Kick questionnaire    Fear of Current or Ex-Partner: No    Emotionally Abused: No    Physically Abused: No    Sexually Abused: No    Allergies: Allergies  Allergen Reactions   Doxycycline Hives    Thrush and mouth sores   Aspirin Rash    Childhood reaction     Current Medications: Current Outpatient Medications  Medication Sig Dispense Refill   acetaminophen (TYLENOL) 650 MG CR tablet Take 1,300 mg by mouth every 8 (eight) hours as needed for pain.     albuterol (VENTOLIN HFA) 108 (90 Base) MCG/ACT inhaler Inhale 2 puffs into the lungs every 6 (six) hours as needed for wheezing or shortness of breath.     azelastine (ASTELIN) 0.1 % nasal spray Place 2 sprays  into both nostrils daily. Use in each nostril as directed 30 mL 0   Cranberry-Vitamin C-Probiotic (AZO CRANBERRY PO) Take by mouth.     docusate sodium (COLACE) 100 MG capsule Take 1 capsule (100 mg total) by mouth 2 (two) times daily. 60 capsule 1   empagliflozin (JARDIANCE) 25 MG TABS tablet Take 25 mg by mouth daily.     ferrous sulfate 325 (65 FE) MG EC tablet Take 325 mg by mouth daily with breakfast.     fluconazole (DIFLUCAN) 150 MG tablet Take 1 tablet (150 mg total) by mouth daily. Take 1 tablet day one and repeat 2 days later. 2 tablet 0   FLUoxetine (PROZAC) 10 MG capsule Take 10 mg by mouth daily.     fluticasone (FLONASE) 50 MCG/ACT nasal spray Place 1 spray into both nostrils daily as needed for allergies or rhinitis.     hydrochlorothiazide (HYDRODIURIL) 12.5 MG tablet Take 12.5 mg by mouth daily.     hydrocortisone 2.5 % cream Apply topically 3 (three) times daily. 30 g 2   ibuprofen (ADVIL) 200 MG tablet Take 400 mg by mouth every 8 (eight) hours as needed for moderate pain.     Insulin Asp Prot & Asp FlexPen (NOVOLOG 70/30 MIX) (70-30) 100 UNIT/ML FlexPen Inject into the skin.     lidocaine-prilocaine (EMLA) cream Apply to affected area once 30 g 3   lisinopril (ZESTRIL) 40 MG tablet Take 40 mg by mouth at bedtime.     loratadine (CLARITIN) 10 MG tablet Take by mouth.     LORazepam (ATIVAN) 0.5 MG tablet Take 1 tablet (0.5 mg total) by mouth every 8 (eight) hours as needed for anxiety (nausea vomiting). 30 tablet 0   metFORMIN (GLUCOPHAGE-XR) 500 MG 24 hr tablet Take 1,000 mg by mouth at bedtime.     metoprolol succinate (TOPROL-XL) 25 MG 24 hr tablet Take 25 mg by mouth daily.     mupirocin ointment (BACTROBAN) 2 % Apply 1 Application topically 3 (three) times daily. 22 g 0   NOVOLIN 70/30 FLEXPEN (70-30) 100 UNIT/ML KwikPen 50-110 Units See admin instructions. 50 units in the morning, 110 units at bedtime     OZEMPIC, 1 MG/DOSE, 4 MG/3ML SOPN Inject 1 mg into the skin every  Tuesday.     potassium chloride SA (KLOR-CON M) 20 MEQ tablet Take 1 tablet (20 mEq total) by mouth daily. 3 tablet 0   traMADol (ULTRAM) 50 MG tablet Take 2 tablets (100 mg total) by mouth every 6 (six) hours as needed. 90 tablet 0   triamcinolone ointment (KENALOG) 0.5 % Apply 1 Application topically 2 (two) times daily. 30 g 0  ondansetron (ZOFRAN) 8 MG tablet Take 1 tablet (8 mg total) by mouth 2 (two) times daily as needed. Start on the third day after chemotherapy. (Patient not taking: Reported on 10/10/2022) 30 tablet 1   prochlorperazine (COMPAZINE) 10 MG tablet Take 1 tablet (10 mg total) by mouth every 6 (six) hours as needed (Nausea or vomiting). (Patient not taking: Reported on 10/10/2022) 30 tablet 1   No current facility-administered medications for this visit.   Facility-Administered Medications Ordered in Other Visits  Medication Dose Route Frequency Provider Last Rate Last Admin   heparin lock flush 100 UNIT/ML injection             Review of Systems General:  no complaints Skin: no complaints Eyes: no complaints HEENT: no complaints Breasts: no complaints Pulmonary: no complaints Cardiac: no complaints Gastrointestinal: no complaints Genitourinary/Sexual: no complaints Ob/Gyn: no complaints Musculoskeletal: no complaints Hematology: no complaints Neurologic/Psych: no complaints    Objective:  Physical Examination:  BP 124/72   Pulse 100   Temp (!) 97.3 F (36.3 C)   Resp 20   Wt 283 lb 8 oz (128.6 kg)   SpO2 98%   BMI 43.11 kg/m    GENERAL: Patient is a well appearing female in no acute distress HEENT:  Atraumatic and normocephalic. PERRL, neck supple. NODES:  No cervical, supraclavicular, axillary, or inguinal lymphadenopathy palpated.  LUNGS:  Normal respiratory effort ABDOMEN:  Soft, nontender. Nondistended. No masses/ascites/hernia/or hepatomegaly.  EXTREMITIES:  No peripheral edema.   NEURO:  Nonfocal. Well oriented.  Appropriate  affect.  Pelvic: EGBUS: positive erythema o/w no lesions Cervix: palpable cervix smooth on exam with normal consistency. Challenging to see on exam and deviated inferiorly/posteriorly.   Vagina: no lesions, no discharge or bleeding Uterus: not grossly enlarged but limited by habitus BME: no palpable masses Rectovaginal: deferred   Lab Review No labs on site today  Radiologic Imaging: Per HPI   Assessment:  Valerie Case is a 72 y.o. female diagnosed with metastatic poorly differentiated endometrial adenocarcinoma (MSI-H) with right sidewall and pelvic lymph node involvement s/p palliative WPRT d/t bleeding, with evidence of completed response based on imaging, and doing well on maintenance pembrolizumab.   Vulvar candidiasis  Adrenal mass, uncertain etiology. Pheocromocytoma workup was negative. Biopsy recommended by urology but denied by IR. Radiology recommended adrenal MRI. Stable findings. DDX: lipid poor adenoma vs metastatic disease  H/o Renal insufficiency (2.6 on 04/01/2021) improved  Medical co-morbidities complicating care: HTN, AODM, depression, morbid obesity, Chronic venous insufficiency of both lower extremities with h/o cellulitis, History of asthma. Body mass index is 43.11 kg/m.  Plan:   Problem List Items Addressed This Visit       Genitourinary   Endometrial adenocarcinoma (HCC) - Primary (Chronic)   Relevant Medications   fluconazole (DIFLUCAN) 150 MG tablet   Other Visit Diagnoses     Vulvar candidiasis       Relevant Medications   fluconazole (DIFLUCAN) 150 MG tablet      Continue to follow up with Dr. Cathie Hoops. Repeat imaging pending.  She prefers oral therapy for vulvar candidiasis; prescription for Diflucan given.    She has a much improved performance status now compared to initial presentation. If she does have local recurrence surgery could be considered.   Germline genetic testing declined. May also consider in the future but prefers to  hold off for now. She met with Lacy Duverney 12/04/21.   Continue to follow up with pcp to update her routine cancer screenings if  repeat imaging negative.    Follow-up in 3 months for continued surveillance. Patient will see Dr Sonia Side for next visit. Prefers female providers.   The patient's diagnosis, an outline of the further diagnostic and laboratory studies which will be required, the recommendation, and alternatives were discussed.  All questions were answered to the patient's satisfaction.   Courtny Bennison Leta Jungling, MD

## 2022-11-20 ENCOUNTER — Other Ambulatory Visit: Payer: Self-pay

## 2022-11-21 ENCOUNTER — Inpatient Hospital Stay: Payer: Medicare PPO | Admitting: Oncology

## 2022-11-21 ENCOUNTER — Other Ambulatory Visit: Payer: Self-pay | Admitting: Oncology

## 2022-11-21 ENCOUNTER — Inpatient Hospital Stay: Payer: Medicare PPO

## 2022-11-21 ENCOUNTER — Encounter: Payer: Self-pay | Admitting: Oncology

## 2022-11-21 VITALS — BP 140/69 | HR 102 | Temp 97.8°F | Resp 18 | Wt 282.9 lb

## 2022-11-21 VITALS — BP 144/60 | HR 89 | Temp 97.6°F | Resp 18

## 2022-11-21 DIAGNOSIS — Z5112 Encounter for antineoplastic immunotherapy: Secondary | ICD-10-CM

## 2022-11-21 DIAGNOSIS — C541 Malignant neoplasm of endometrium: Secondary | ICD-10-CM

## 2022-11-21 DIAGNOSIS — D631 Anemia in chronic kidney disease: Secondary | ICD-10-CM

## 2022-11-21 DIAGNOSIS — E278 Other specified disorders of adrenal gland: Secondary | ICD-10-CM

## 2022-11-21 DIAGNOSIS — G629 Polyneuropathy, unspecified: Secondary | ICD-10-CM | POA: Insufficient documentation

## 2022-11-21 DIAGNOSIS — N189 Chronic kidney disease, unspecified: Secondary | ICD-10-CM | POA: Diagnosis not present

## 2022-11-21 LAB — COMPREHENSIVE METABOLIC PANEL
ALT: 19 U/L (ref 0–44)
AST: 22 U/L (ref 15–41)
Albumin: 4 g/dL (ref 3.5–5.0)
Alkaline Phosphatase: 56 U/L (ref 38–126)
Anion gap: 10 (ref 5–15)
BUN: 36 mg/dL — ABNORMAL HIGH (ref 8–23)
CO2: 20 mmol/L — ABNORMAL LOW (ref 22–32)
Calcium: 10.4 mg/dL — ABNORMAL HIGH (ref 8.9–10.3)
Chloride: 108 mmol/L (ref 98–111)
Creatinine, Ser: 1.11 mg/dL — ABNORMAL HIGH (ref 0.44–1.00)
GFR, Estimated: 53 mL/min — ABNORMAL LOW (ref >60)
Glucose, Bld: 180 mg/dL — ABNORMAL HIGH (ref 70–99)
Potassium: 4.5 mmol/L (ref 3.5–5.1)
Sodium: 138 mmol/L (ref 135–145)
Total Bilirubin: 0.2 mg/dL — ABNORMAL LOW (ref 0.3–1.2)
Total Protein: 7.1 g/dL (ref 6.5–8.1)

## 2022-11-21 LAB — CBC WITH DIFFERENTIAL/PLATELET
Abs Immature Granulocytes: 0.06 10*3/uL (ref 0.00–0.07)
Basophils Absolute: 0 10*3/uL (ref 0.0–0.1)
Basophils Relative: 1 %
Eosinophils Absolute: 0.2 10*3/uL (ref 0.0–0.5)
Eosinophils Relative: 4 %
HCT: 35.4 % — ABNORMAL LOW (ref 36.0–46.0)
Hemoglobin: 11.3 g/dL — ABNORMAL LOW (ref 12.0–15.0)
Immature Granulocytes: 1 %
Lymphocytes Relative: 16 %
Lymphs Abs: 1 10*3/uL (ref 0.7–4.0)
MCH: 30.9 pg (ref 26.0–34.0)
MCHC: 31.9 g/dL (ref 30.0–36.0)
MCV: 96.7 fL (ref 80.0–100.0)
Monocytes Absolute: 0.7 10*3/uL (ref 0.1–1.0)
Monocytes Relative: 10 %
Neutro Abs: 4.4 10*3/uL (ref 1.7–7.7)
Neutrophils Relative %: 68 %
Platelets: 252 10*3/uL (ref 150–400)
RBC: 3.66 MIL/uL — ABNORMAL LOW (ref 3.87–5.11)
RDW: 13.6 % (ref 11.5–15.5)
WBC: 6.4 10*3/uL (ref 4.0–10.5)
nRBC: 0 % (ref 0.0–0.2)

## 2022-11-21 LAB — TSH: TSH: 1.312 u[IU]/mL (ref 0.350–4.500)

## 2022-11-21 MED ORDER — HEPARIN SOD (PORK) LOCK FLUSH 100 UNIT/ML IV SOLN
500.0000 [IU] | Freq: Once | INTRAVENOUS | Status: AC | PRN
  Administered 2022-11-21: 500 [IU]
  Filled 2022-11-21: qty 5

## 2022-11-21 MED ORDER — SODIUM CHLORIDE 0.9 % IV SOLN
200.0000 mg | Freq: Once | INTRAVENOUS | Status: AC
  Administered 2022-11-21: 200 mg via INTRAVENOUS
  Filled 2022-11-21: qty 8

## 2022-11-21 MED ORDER — GABAPENTIN 100 MG PO CAPS: 100.0000 mg | ORAL_CAPSULE | Freq: Two times a day (BID) | ORAL | 0 refills | Status: DC

## 2022-11-21 MED ORDER — SODIUM CHLORIDE 0.9 % IV SOLN
Freq: Once | INTRAVENOUS | Status: AC
  Filled 2022-11-21: qty 250

## 2022-11-21 NOTE — Patient Instructions (Signed)
[facility]  Discharge Instructions: Thank you for choosing [facility] to provide your oncology and hematology care.  If you have a lab appointment with the Cancer Center, please go directly to the Cancer Center and check in at the registration area.  Wear comfortable clothing and clothing appropriate for easy access to any Portacath or PICC line.   We strive to give you quality time with your provider. You may need to reschedule your appointment if you arrive late (15 or more minutes).  Arriving late affects you and other patients whose appointments are after yours.  Also, if you miss three or more appointments without notifying the office, you may be dismissed from the clinic at the provider's discretion.      For prescription refill requests, have your pharmacy contact our office and allow 72 hours for refills to be completed.    Today you received the following chemotherapy and/or immunotherapy agents- keytruda      To help prevent nausea and vomiting after your treatment, we encourage you to take your nausea medication as directed.  BELOW ARE SYMPTOMS THAT SHOULD BE REPORTED IMMEDIATELY: *FEVER GREATER THAN 100.4 F (38 C) OR HIGHER *CHILLS OR SWEATING *NAUSEA AND VOMITING THAT IS NOT CONTROLLED WITH YOUR NAUSEA MEDICATION *UNUSUAL SHORTNESS OF BREATH *UNUSUAL BRUISING OR BLEEDING *URINARY PROBLEMS (pain or burning when urinating, or frequent urination) *BOWEL PROBLEMS (unusual diarrhea, constipation, pain near the anus) TENDERNESS IN MOUTH AND THROAT WITH OR WITHOUT PRESENCE OF ULCERS (sore throat, sores in mouth, or a toothache) UNUSUAL RASH, SWELLING OR PAIN  UNUSUAL VAGINAL DISCHARGE OR ITCHING   Items with * indicate a potential emergency and should be followed up as soon as possible or go to the Emergency Department if any problems should occur.  Please show the CHEMOTHERAPY ALERT CARD or IMMUNOTHERAPY ALERT CARD at check-in to  the Emergency Department and triage nurse.  Should you have questions after your visit or need to cancel or reschedule your appointment, please contact [facility]  [phone] and follow the prompts.  Office hours are 8:00 a.m. to 4:30 p.m. Monday - Friday. Please note that voicemails left after 4:00 p.m. may not be returned until the following business day.  We are closed weekends and major holidays. You have access to a nurse at all times for urgent questions. Please call the main number to the clinic [phone] and follow the prompts.  For any non-urgent questions, you may also contact your provider using MyChart. We now offer e-Visits for anyone 18 and older to request care online for non-urgent symptoms. For details visit [email or web address].   Also download the MyChart app! Go to the app store, search "MyChart", open the app, select , and log in with your MyChart username and password.    

## 2022-11-21 NOTE — Progress Notes (Signed)
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 s/p carboplatin and Taxol for 5 cycles, on Keytruda maintenance.  Labs are reviewed and discussed with patient. Proceed with Keytruda Plan repeat CT in early Oct    Adrenal mass (HCC) lipid poor hyperfunctioning adenoma vs mets.  FDG avid on PET scan.  Difficult biopsy due to patient's body habitus and deep position of the mass. Resolved on PET scan, suggesting that this lesion might be a metastatic lesion. Continue monitor   Anemia in chronic kidney disease (CKD) She is currently on immunotherapy.  Hemoglobin improved.   Neuropathy Trial of gabapentin 100mg  BID. Rx sent.   Encounter for antineoplastic immunotherapy Treatment plan as listed above.    Orders Placed This Encounter  Procedures   CT CHEST ABDOMEN PELVIS WO CONTRAST    Standing Status:   Future    Standing Expiration Date:   11/21/2023    Order Specific Question:   Preferred imaging location?    Answer:   [facility]    Order Specific Question:   If indicated for the ordered procedure, I authorize the administration of oral contrast media per Radiology protocol    Answer:   Yes    Order Specific Question:   Does the patient have a contrast media/X-ray dye allergy?    Answer:   No    Return of visit:  3 weeks Lab MD Greggory Brandy, MD, [name] [facility] Hematology Oncology 11/21/2022     HISTORY OF PRESENTING ILLNESS:   Valerie Case is a  72 y.o.  female  presents for follow up of  FIGO grade 3 poorly differentiated endometrial adenocarcinoma. Oncology History  Endometrial adenocarcinoma (HCC)  12/12/2020 Imaging   12/12/2020, CT abdomen pelvis with contrast showed no radiographic evidence of urinary tract neoplasm, calculi, hydronephrosis.  2.6 cm nonspecific left adrenal mass. 02/12/2021 CT hematuria work-up showed small uterine fibroids, no findings to explain hematuria.Hepatomegaly, diverticulosis without evidence of diverticulitis, Coronary artery disease   04/01/2021 -  Hospital Admission   04/01/2021 - 04/03/2021, patient was hospitalized due to symptomatic anemia, hemoglobin 6.9, heavy postmenopausal bleeding with passing large clots.  She also presented with increased creatinine level to 2.58 compared to her baseline level of 0.9 in November 2022.  Patient received IV iron infusion and 2 units of PRBC during the hospital stay..  04/02/2021, iron panel showed iron saturation 37, ferritin 37, TIBC 333-the studies were done after patient received blood transfusion.  At discharge, hemoglobin was 7.7.   04/01/2021 Initial Diagnosis   04/01/2020, endometrial biopsy showed poorly differentiated endometrial adenocarcinoma. Omniseq NGS showed TMB 48.4 mt/mb [high], MSI High, PD-L1-TPS <1%, PIK3CA H1047Q, Negative for BRAF, HER2, NTRK1 RET.     04/10/2021 Cancer Staging   Staging form: Corpus Uteri - Carcinoma and Carcinosarcoma, AJCC 8th Edition - Clinical stage from 04/10/2021: FIGO Stage III, calculated as Stage Unknown (cT3, cNX, cM0) - Signed by Rickard Patience, MD on 08/24/2021 Stage prefix: Initial diagnosis   04/15/2021 Imaging   PET showed 1. Large hypermetabolic endometrial mass consistent with known endometrial  cancer. Suspect direct invasion/involvement of the right adnexa. Right pelvic sidewall hypermetabolic adenopathy.2. Enlarged and hypermetabolic left adrenal gland lesion could reflect a lipid poor hyperfunctioning adenoma but metastasis is also  possible. 3. No findings for metastatic disease involving the chest or bonystructures.     04/24/2021 - 05/29/2021 Radiation Therapy   status post radiation to pelvis   08/05/2021 Imaging   MRI abdomen w wo contrast 1. Stable solid enhancing 2.7 cm left adrenal lesion, which was hypermetabolic on prior PET/CTs but is unchanged in size dating back to December 12, 2020. 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 However, unfortunately metastatic disease can not be entirely excluded and remains a pertinent differential consideration. Comparison with more remote prior imaging would be the most valuable tool in the assessment of this lesion, as demonstrating long-term stability would indicate this to be a benign lesion. However, if no prior imaging can be made available, would consider follow-up adrenal protocol CT with and without intravenous contrast material in 3 months as this would allow for further assessment of stability as well as enhancement and washout characteristics of the lesion potentially allowing for better characterization versus direct tissue sampling.2. Hepatomegaly and hepatic steatosis.3. Colonic diverticulosis without findings of acute diverticulitis    08/16/2021 - 09/27/2021 Chemotherapy   UTERINE Carboplatin AUC 5 / Paclitaxel q21d x 3      10/03/2021 Imaging   PET 1. No residual hypermetabolism in the uterus suggesting an excellent response to treatment. No findings for metastatic disease. 2. Resolution of hypermetabolism in the left adrenal gland lesion suggesting this was metastatic disease. 3. Diffuse marrow hypermetabolism likely due to rebound from chemotherapy or marrow stimulating drugs   10/18/2021 - 11/08/2021 Chemotherapy   AUC 5 / Paclitaxel/ Keytruda Q21d  x 2 cycles   11/29/2021 -  Chemotherapy    Patient is on Treatment Plan : UTERINE Pembrolizumab (200) q21d     04/30/2022 Imaging   CT chest abdomen pelvis w contrast  1. Similar to minimal decrease in size of a left adrenal nodule which was felt suspicious for metastatic disease on 10/03/2021 PET. 2. No evidence of new or progressive disease. 3. Coronary artery atherosclerosis. Aortic Atherosclerosis (ICD10-I70.0). 4. Incidental findings, including: Hepatomegaly. Possible constipation.   08/28/2022 Imaging   CT chest abdomen pelvis w contrast showed 1. Similar appearance of indeterminate left adrenal nodule. 2. Otherwise, no evidence of metastatic disease. 3. Hepatomegaly 4.  Possible constipation. 5. Coronary artery atherosclerosis. Aortic Atherosclerosis (ICD10-I70.0).       INTERVAL HISTORY Valerie Case is a 73 y.o. female who has above history reviewed by me today presents for follow up visit for anemia and endometrial cancer Overall she tolerates treatment, slightly more SOB when she is outdoor, due to hot weather and high humidity.  Patient denies nausea vomiting diarrhea. Recently see Gynonc and was treated with fluconazole for vaginal yeast infection.    Review of Systems  Constitutional:  Positive for fatigue. Negative for chills and fever.  HENT:   Negative for hearing loss and voice change.   Eyes:  Negative for eye problems.  Respiratory:  Negative for chest tightness and cough.   Cardiovascular:  Negative for chest pain.  Gastrointestinal:  Negative for abdominal distention, abdominal pain and blood in stool.  Endocrine: Negative for hot flashes.  Genitourinary:  Negative for difficulty urinating, frequency and vaginal  bleeding.   Musculoskeletal:  Positive for arthralgias.  Skin:  Positive for rash. Negative for itching.  Neurological:  Positive for headaches. Negative for extremity weakness.  Hematological:  Negative for adenopathy.  Psychiatric/Behavioral:  Negative for confusion.     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    SURGICAL HISTORY: Past Surgical History:  Procedure Laterality Date   CESAREAN SECTION  01/23/1979   IR IMAGING GUIDED PORT INSERTION  08/09/2021   TONSILLECTOMY  03/17/1956    SOCIAL HISTORY: Social History   Socioeconomic History   Marital status: Divorced    Spouse name: Not on file   Number of children: Not on file   Years of education: Not on file   Highest education level: Not on file  Occupational History   Not on file  Tobacco Use   Smoking status: Never   Smokeless tobacco: Never  Vaping Use   Vaping status: Never Used  Substance and Sexual Activity   Alcohol use: Not Currently   Drug use: Never   Sexual activity: Not Currently  Other Topics Concern   Not on file  Social History Narrative      Social Determinants of Health   Financial Resource Strain: Low Risk  (08/12/2022)   Received from [facility], [facility]   Overall Financial Resource Strain (CARDIA)    Difficulty of Paying Living Expenses: Not hard at all  Food Insecurity: No Food Insecurity (08/12/2022)   Received [facility], [facility]   Hunger Vital Sign    Worried About Running Out of Food in the Last Year: Never true    Ran Out of Food in the Last Year: Never true  Transportation Needs: No Transportation Needs (08/12/2022)   Received from [facility], [facility]   [facility] - Transportation    In the past 12 months, has lack of transportation kept you from medical appointments or from getting medications?: No    Lack of Transportation (Non-Medical): No  Physical Activity:  Inactive (09/27/2021)   Exercise Vital Sign    Days of Exercise per Week: 0 days    Minutes of Exercise per Session: 0 min  Stress: Stress Concern Present (09/27/2021)   [employer] of Occupational Health - Occupational Stress Questionnaire    Feeling of Stress : Rather much  Social Connections: Moderately Integrated (09/27/2021)   Social Connection and Isolation Panel [NHANES]    Frequency of Communication with Friends and Family: Three times a week    Frequency of Social Gatherings with Friends and Family: Three times a week    Attends Religious Services: 1 to 4 times per year    Active Member of Clubs or Organizations: No    Attends Banker Meetings: 1 to 4 times per year    Marital Status: Divorced  Intimate Partner Violence: Not At Risk (09/27/2021)   Humiliation, Afraid, Rape, and Kick questionnaire    Fear of Current or Ex-Partner: No    Emotionally Abused: No    Physically Abused: No    Sexually Abused: No    FAMILY HISTORY: Family History  Problem Relation Age of  Onset   Breast cancer Mother 57   Cancer Mother    Cancer Father    Lung cancer Father    Breast cancer Cousin        dx 69s maternal    ALLERGIES:  is allergic to doxycycline and aspirin.  MEDICATIONS:  Current Outpatient Medications  Medication Sig Dispense Refill   acetaminophen (TYLENOL) 650 MG CR tablet Take 1,300 mg by mouth every 8 (eight) hours as needed for pain.     albuterol (VENTOLIN HFA) 108 (90 Base) MCG/ACT inhaler Inhale 2 puffs into the lungs every 6 (six) hours as needed for wheezing or shortness of breath.     azelastine (ASTELIN) 0.1 % nasal spray Place 2 sprays into both nostrils daily. Use in each nostril as directed 30 mL 0   Cranberry-Vitamin C-Probiotic (AZO CRANBERRY PO) Take by mouth.     docusate sodium (COLACE) 100 MG capsule Take 1 capsule (100 mg total) by mouth 2 (two) times daily. 60 capsule 1   empagliflozin (JARDIANCE) 25 MG TABS tablet Take 25 mg by mouth  daily.     ferrous sulfate 325 (65 FE) MG EC tablet Take 325 mg by mouth daily with breakfast.     FLUoxetine (PROZAC) 10 MG capsule Take 10 mg by mouth daily.     fluticasone (FLONASE) 50 MCG/ACT nasal spray Place 1 spray into both nostrils daily as needed for allergies or rhinitis.     gabapentin (NEURONTIN) 100 MG capsule Take 1 capsule (100 mg total) by mouth 2 (two) times daily. 60 capsule 0   hydrochlorothiazide (HYDRODIURIL) 12.5 MG tablet Take 12.5 mg by mouth daily.     hydrocortisone 2.5 % cream Apply topically 3 (three) times daily. 30 g 2   ibuprofen (ADVIL) 200 MG tablet Take 400 mg by mouth every 8 (eight) hours as needed for moderate pain.     Insulin Asp Prot & Asp FlexPen (NOVOLOG 70/30 MIX) (70-30) 100 UNIT/ML FlexPen Inject into the skin.     lidocaine-prilocaine (EMLA) cream Apply to affected area once 30 g 3   lisinopril (ZESTRIL) 40 MG tablet Take 40 mg by mouth at bedtime.     loratadine (CLARITIN) 10 MG tablet Take by mouth.     LORazepam (ATIVAN) 0.5 MG tablet Take 1 tablet (0.5 mg total) by mouth every 8 (eight) hours as needed for anxiety (nausea vomiting). 30 tablet 0   metFORMIN (GLUCOPHAGE-XR) 500 MG 24 hr tablet Take 1,000 mg by mouth at bedtime.     metoprolol succinate (TOPROL-XL) 25 MG 24 hr tablet Take 25 mg by mouth daily.     mupirocin ointment (BACTROBAN) 2 % Apply 1 Application topically 3 (three) times daily. 22 g 0   NOVOLIN 70/30 FLEXPEN (70-30) 100 UNIT/ML KwikPen 50-110 Units See admin instructions. 50 units in the morning, 110 units at bedtime     OZEMPIC, 1 MG/DOSE, 4 MG/3ML SOPN Inject 1 mg into the skin every Tuesday.     potassium chloride SA (KLOR-CON M) 20 MEQ tablet Take 1 tablet (20 mEq total) by mouth daily. 3 tablet 0   traMADol (ULTRAM) 50 MG tablet Take 2 tablets (100 mg total) by mouth every 6 (six) hours as needed. 90 tablet 0   triamcinolone ointment (KENALOG) 0.5 % Apply 1 Application topically 2 (two) times daily. 30 g 0   fluconazole  (DIFLUCAN) 150 MG tablet Take 1 tablet (150 mg total) by mouth daily. Take 1 tablet day one and repeat 2 days later. (  Patient not taking: Reported on 11/21/2022) 2 tablet 0   ondansetron (ZOFRAN) 8 MG tablet Take 1 tablet (8 mg total) by mouth 2 (two) times daily as needed. Start on the third day after chemotherapy. (Patient not taking: Reported on 10/10/2022) 30 tablet 1   prochlorperazine (COMPAZINE) 10 MG tablet Take 1 tablet (10 mg total) by mouth every 6 (six) hours as needed (Nausea or vomiting). (Patient not taking: Reported on 10/10/2022) 30 tablet 1   No current facility-administered medications for this visit.   Facility-Administered Medications Ordered in Other Visits  Medication Dose Route Frequency Provider Last Rate Last Admin   heparin lock flush 100 UNIT/ML injection              PHYSICAL EXAMINATION: ECOG PERFORMANCE STATUS: 1 - Symptomatic but completely ambulatory  Physical Exam Constitutional:      General: She is not in acute distress.    Appearance: She is obese.  HENT:     Head: Normocephalic and atraumatic.  Eyes:     General: No scleral icterus. Cardiovascular:     Rate and Rhythm: Normal rate.  Pulmonary:     Effort: Pulmonary effort is normal. No respiratory distress.     Breath sounds: No wheezing.  Abdominal:     General: Bowel sounds are normal. There is no distension.     Palpations: Abdomen is soft.  Musculoskeletal:        General: No deformity. Normal range of motion.     Cervical back: Normal range of motion.     Right lower leg: Edema present.     Left lower leg: Edema present.     Comments:    Skin:    General: Skin is warm and dry.     Findings: Rash present.     Comments: Chronic venous sufficiency skin changes  Neurological:     Mental Status: She is alert and oriented to person, place, and time. Mental status is at baseline.     Cranial Nerves: No cranial nerve deficit.     Coordination: Coordination normal.  Psychiatric:        Mood  and Affect: Mood normal.    LABORATORY DATA:  I have reviewed the data as listed    Latest Ref Rng & Units 11/21/2022    8:37 AM 10/31/2022    8:53 AM 10/10/2022    9:03 AM  CBC  WBC 4.0 - 10.5 K/uL 6.4  6.5  6.9   Hemoglobin 12.0 - 15.0 g/dL 75.6  43.3  29.5   Hematocrit 36.0 - 46.0 % 35.4  35.7  34.9   Platelets 150 - 400 K/uL 252  270  264       Latest Ref Rng & Units 11/21/2022    8:37 AM 10/31/2022    8:53 AM 10/10/2022    9:03 AM  CMP  Glucose 70 - 99 mg/dL 188  416  606   BUN 8 - 23 mg/dL 36  33  34   Creatinine 0.44 - 1.00 mg/dL 3.01  6.01  0.93   Sodium 135 - 145 mmol/L 138  137  139   Potassium 3.5 - 5.1 mmol/L 4.5  4.1  4.0   Chloride 98 - 111 mmol/L 108  109  109   CO2 22 - 32 mmol/L 20  18  20    Calcium 8.9 - 10.3 mg/dL 23.5  9.2  9.6   Total Protein 6.5 - 8.1 g/dL 7.1  7.1  7.1   Total  Bilirubin 0.3 - 1.2 mg/dL 0.2  <7.8  0.3   Alkaline Phos 38 - 126 U/L 56  56  53   AST 15 - 41 U/L 22  21  20    ALT 0 - 44 U/L 19  18  17        RADIOGRAPHIC STUDIES: I have personally reviewed the radiological images as listed and agreed with the findings in the report. No results found.

## 2022-11-21 NOTE — Assessment & Plan Note (Signed)
Trial of gabapentin 100mg  BID. Rx sent.

## 2022-11-21 NOTE — Assessment & Plan Note (Signed)
lipid poor hyperfunctioning adenoma vs mets.  FDG avid on PET scan.  Difficult biopsy due to patient's body habitus and deep position of the mass. Resolved on PET scan, suggesting that this lesion might be a metastatic lesion. Continue monitor  

## 2022-11-21 NOTE — Assessment & Plan Note (Signed)
Treatment plan as listed above. 

## 2022-11-21 NOTE — Assessment & Plan Note (Signed)
She is currently on immunotherapy.  Hemoglobin improved.  

## 2022-11-21 NOTE — Assessment & Plan Note (Addendum)
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 s/p carboplatin and Taxol for 5 cycles, on Keytruda maintenance.  Labs are reviewed and discussed with patient. Proceed with Keytruda Plan repeat CT in early Oct

## 2022-12-12 ENCOUNTER — Inpatient Hospital Stay: Payer: Medicare PPO

## 2022-12-12 ENCOUNTER — Encounter: Payer: Self-pay | Admitting: Oncology

## 2022-12-12 ENCOUNTER — Inpatient Hospital Stay: Payer: Medicare PPO | Admitting: Oncology

## 2022-12-12 VITALS — BP 131/71 | HR 106 | Temp 99.5°F | Resp 18 | Wt 282.7 lb

## 2022-12-12 VITALS — BP 133/57 | HR 87 | Resp 18

## 2022-12-12 DIAGNOSIS — C541 Malignant neoplasm of endometrium: Secondary | ICD-10-CM

## 2022-12-12 DIAGNOSIS — Z5112 Encounter for antineoplastic immunotherapy: Secondary | ICD-10-CM

## 2022-12-12 DIAGNOSIS — G629 Polyneuropathy, unspecified: Secondary | ICD-10-CM | POA: Diagnosis not present

## 2022-12-12 DIAGNOSIS — E278 Other specified disorders of adrenal gland: Secondary | ICD-10-CM | POA: Diagnosis not present

## 2022-12-12 DIAGNOSIS — N189 Chronic kidney disease, unspecified: Secondary | ICD-10-CM

## 2022-12-12 DIAGNOSIS — D631 Anemia in chronic kidney disease: Secondary | ICD-10-CM

## 2022-12-12 DIAGNOSIS — Z23 Encounter for immunization: Secondary | ICD-10-CM

## 2022-12-12 LAB — COMPREHENSIVE METABOLIC PANEL
ALT: 18 U/L (ref 0–44)
AST: 22 U/L (ref 15–41)
Albumin: 3.9 g/dL (ref 3.5–5.0)
Alkaline Phosphatase: 56 U/L (ref 38–126)
Anion gap: 12 (ref 5–15)
BUN: 38 mg/dL — ABNORMAL HIGH (ref 8–23)
CO2: 17 mmol/L — ABNORMAL LOW (ref 22–32)
Calcium: 9.5 mg/dL (ref 8.9–10.3)
Chloride: 109 mmol/L (ref 98–111)
Creatinine, Ser: 1.01 mg/dL — ABNORMAL HIGH (ref 0.44–1.00)
GFR, Estimated: 60 mL/min — ABNORMAL LOW (ref >60)
Glucose, Bld: 194 mg/dL — ABNORMAL HIGH (ref 70–99)
Potassium: 4 mmol/L (ref 3.5–5.1)
Sodium: 138 mmol/L (ref 135–145)
Total Bilirubin: 0.3 mg/dL (ref 0.3–1.2)
Total Protein: 7.2 g/dL (ref 6.5–8.1)

## 2022-12-12 LAB — CBC WITH DIFFERENTIAL/PLATELET
Abs Immature Granulocytes: 0.04 10*3/uL (ref 0.00–0.07)
Basophils Absolute: 0.1 10*3/uL (ref 0.0–0.1)
Basophils Relative: 1 %
Eosinophils Absolute: 0.2 10*3/uL (ref 0.0–0.5)
Eosinophils Relative: 4 %
HCT: 35.7 % — ABNORMAL LOW (ref 36.0–46.0)
Hemoglobin: 11.4 g/dL — ABNORMAL LOW (ref 12.0–15.0)
Immature Granulocytes: 1 %
Lymphocytes Relative: 16 %
Lymphs Abs: 1.1 10*3/uL (ref 0.7–4.0)
MCH: 30.9 pg (ref 26.0–34.0)
MCHC: 31.9 g/dL (ref 30.0–36.0)
MCV: 96.7 fL (ref 80.0–100.0)
Monocytes Absolute: 0.7 10*3/uL (ref 0.1–1.0)
Monocytes Relative: 10 %
Neutro Abs: 4.6 10*3/uL (ref 1.7–7.7)
Neutrophils Relative %: 68 %
Platelets: 279 10*3/uL (ref 150–400)
RBC: 3.69 MIL/uL — ABNORMAL LOW (ref 3.87–5.11)
RDW: 13.7 % (ref 11.5–15.5)
WBC: 6.6 10*3/uL (ref 4.0–10.5)
nRBC: 0 % (ref 0.0–0.2)

## 2022-12-12 LAB — TSH: TSH: 1.737 u[IU]/mL (ref 0.350–4.500)

## 2022-12-12 MED ORDER — SODIUM CHLORIDE 0.9 % IV SOLN
Freq: Once | INTRAVENOUS | Status: AC
  Filled 2022-12-12: qty 250

## 2022-12-12 MED ORDER — HEPARIN SOD (PORK) LOCK FLUSH 100 UNIT/ML IV SOLN
500.0000 [IU] | Freq: Once | INTRAVENOUS | Status: AC | PRN
  Administered 2022-12-12: 500 [IU]
  Filled 2022-12-12: qty 5

## 2022-12-12 MED ORDER — SODIUM CHLORIDE 0.9 % IV SOLN
200.0000 mg | Freq: Once | INTRAVENOUS | Status: AC
  Administered 2022-12-12: 200 mg via INTRAVENOUS
  Filled 2022-12-12: qty 8

## 2022-12-12 MED ORDER — SODIUM CHLORIDE 0.9% FLUSH
10.0000 mL | INTRAVENOUS | Status: DC | PRN
  Administered 2022-12-12: 10 mL
  Filled 2022-12-12: qty 10

## 2022-12-12 MED ORDER — SODIUM CHLORIDE 0.9% FLUSH
10.0000 mL | Freq: Once | INTRAVENOUS | Status: AC
  Administered 2022-12-12: 10 mL via INTRAVENOUS
  Filled 2022-12-12: qty 10

## 2022-12-12 MED ORDER — INFLUENZA VAC A&B SA ADJ QUAD 0.5 ML IM PRSY: 0.5000 mL | PREFILLED_SYRINGE | Freq: Once | INTRAMUSCULAR | Status: DC

## 2022-12-12 MED ORDER — INFLUENZA VAC A&B SURF ANT ADJ 0.5 ML IM SUSY
0.5000 mL | PREFILLED_SYRINGE | Freq: Once | INTRAMUSCULAR | Status: AC
  Administered 2022-12-12: 0.5 mL via INTRAMUSCULAR
  Filled 2022-12-12: qty 0.5

## 2022-12-12 NOTE — Assessment & Plan Note (Signed)
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 s/p carboplatin and Taxol for 5 cycles, on Keytruda maintenance.  Labs are reviewed and discussed with patient. Proceed with Keytruda Plan repeat CT in early Oct

## 2022-12-12 NOTE — Assessment & Plan Note (Addendum)
Trial of gabapentin 100mg  BID.

## 2022-12-12 NOTE — Addendum Note (Signed)
Addended by: Rickard Patience on: 12/12/2022 02:18 PM   Modules accepted: Orders

## 2022-12-12 NOTE — Progress Notes (Signed)
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 s/p carboplatin and Taxol for 5 cycles, on Keytruda maintenance.  Labs are reviewed and discussed with patient. Proceed with Keytruda Plan repeat CT in early Oct    Adrenal mass (HCC) lipid poor hyperfunctioning adenoma vs mets.  FDG avid on PET scan.  Difficult biopsy due to patient's body habitus and deep position of the mass. Resolved on PET scan, suggesting that this lesion might be a metastatic lesion. Continue monitor   Anemia in chronic kidney disease (CKD) Stable hemoglobin  Encounter for antineoplastic immunotherapy Treatment plan as listed above.   Neuropathy Trial of gabapentin 100mg  BID.    No orders of the defined types were placed in this encounter.   Return of visit:  3 weeks Lab MD Greggory Brandy, MD, [name] [facility] Hematology Oncology 12/12/2022     HISTORY OF PRESENTING ILLNESS:   Valerie Case is a  72 y.o.  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 Oncology History  Endometrial adenocarcinoma (HCC)  12/12/2020 Imaging   12/12/2020, CT abdomen pelvis with contrast showed no radiographic evidence of urinary tract neoplasm, calculi, hydronephrosis.  2.6 cm nonspecific left adrenal mass. 02/12/2021 CT hematuria work-up showed small uterine fibroids, no findings to explain hematuria.Hepatomegaly, diverticulosis without evidence of diverticulitis, Coronary  artery disease   04/01/2021 -  Hospital Admission   04/01/2021 - 04/03/2021, patient was hospitalized due to symptomatic anemia, hemoglobin 6.9, heavy postmenopausal bleeding with passing large clots.  She also presented with increased creatinine level to 2.58 compared to her baseline level of 0.9 in November 2022.  Patient received IV iron infusion and 2 units of PRBC during the hospital stay..  04/02/2021, iron panel showed iron saturation 37, ferritin 37, TIBC 333-the studies were done after patient received blood transfusion.  At discharge, hemoglobin was 7.7.   04/01/2021 Initial Diagnosis   04/01/2020, endometrial biopsy showed poorly differentiated endometrial adenocarcinoma. Omniseq NGS showed TMB 48.4 mt/mb [high], MSI High, PD-L1-TPS <1%, PIK3CA H1047Q, Negative for BRAF, HER2, NTRK1 RET.     04/10/2021 Cancer Staging   Staging form: Corpus Uteri - Carcinoma and Carcinosarcoma, AJCC 8th Edition - Clinical stage from 04/10/2021: FIGO Stage III, calculated as Stage Unknown (cT3, cNX, cM0) - Signed by Rickard Patience, MD on 08/24/2021 Stage prefix: Initial diagnosis   04/15/2021 Imaging   PET showed 1. Large hypermetabolic endometrial mass consistent with known endometrial cancer. Suspect direct invasion/involvement of the right adnexa. Right pelvic sidewall hypermetabolic adenopathy.2. 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 3. No findings for metastatic disease involving the chest or bonystructures.     04/24/2021 - 05/29/2021 Radiation Therapy   status post radiation to pelvis   08/05/2021 Imaging   MRI abdomen w wo contrast 1. Stable solid enhancing 2.7 cm left adrenal lesion, which was hypermetabolic on prior PET/CTs but is unchanged in size dating back to December 12, 2020. While the imaging characteristics are again nonspecific, given its  relative stability since September 2022 and the relative  rarity of isolated adrenal metastases this is favored to reflect a lipid poor adenoma. However, unfortunately metastatic disease can not be entirely excluded and remains a pertinent differential consideration. Comparison with more remote prior imaging would be the most valuable tool in the assessment of this lesion, as demonstrating long-term stability would indicate this to be a benign lesion. However, if no prior imaging can be made available, would consider follow-up adrenal protocol CT with and without intravenous contrast material in 3 months as this would allow for further assessment of stability as well as enhancement and washout characteristics of the lesion potentially allowing for better characterization versus direct tissue sampling.2. Hepatomegaly and hepatic steatosis.3. Colonic diverticulosis without findings of acute diverticulitis    08/16/2021 - 09/27/2021 Chemotherapy   UTERINE Carboplatin AUC 5 / Paclitaxel q21d x 3      10/03/2021 Imaging   PET 1. No residual hypermetabolism in the uterus suggesting an excellent response to treatment. No findings for metastatic disease. 2. Resolution of hypermetabolism in the left adrenal gland lesion suggesting this was metastatic disease. 3. Diffuse marrow hypermetabolism likely due to rebound from chemotherapy or marrow stimulating drugs   10/18/2021 - 11/08/2021 Chemotherapy   AUC 5 / Paclitaxel/ Keytruda Q21d  x 2 cycles   11/29/2021 -  Chemotherapy   Patient is on Treatment Plan : UTERINE Pembrolizumab (200) q21d     04/30/2022 Imaging   CT chest abdomen pelvis w contrast  1. Similar to minimal decrease in size of a left adrenal nodule which was felt suspicious for metastatic disease on 10/03/2021 PET. 2. No evidence of new or progressive disease. 3. Coronary artery atherosclerosis. Aortic Atherosclerosis (ICD10-I70.0). 4. Incidental findings, including: Hepatomegaly. Possible constipation.   08/28/2022 Imaging   CT chest abdomen pelvis  w contrast showed 1. Similar appearance of indeterminate left adrenal nodule. 2. Otherwise, no evidence of metastatic disease. 3. Hepatomegaly 4.  Possible constipation. 5. Coronary artery atherosclerosis. Aortic Atherosclerosis (ICD10-I70.0).       INTERVAL HISTORY MAYCI HANING is a 72 y.o. female who has above history reviewed by me today presents for follow up visit for anemia and endometrial cancer Overall she tolerates treatment Patient denies nausea vomiting diarrhea. Heart rate was high at 106 in clinic today. She was in a rush coming to her appointment and has not drank much water in the morning.    Review of Systems  Constitutional:  Positive for fatigue. Negative for chills and fever.  HENT:   Negative for hearing loss and voice change.   Eyes:  Negative for eye problems.  Respiratory:  Negative for chest tightness and cough.   Cardiovascular:  Negative for chest pain.  Gastrointestinal:  Negative for abdominal distention, abdominal pain and blood in stool.  Endocrine: Negative for hot flashes.  Genitourinary:  Negative for difficulty urinating, frequency and vaginal bleeding.   Musculoskeletal:  Positive for arthralgias.  Skin:  Negative for itching and rash.  Neurological:  Positive for headaches. Negative for extremity weakness.  Hematological:  Negative for adenopathy.  Psychiatric/Behavioral:  Negative for confusion.     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    SURGICAL  HISTORY: Past Surgical History:  Procedure Laterality Date   CESAREAN SECTION  01/23/1979   IR IMAGING GUIDED PORT INSERTION  08/09/2021   TONSILLECTOMY  03/17/1956    SOCIAL HISTORY: Social History    Socioeconomic History   Marital status: Divorced    Spouse name: Not on file   Number of children: Not on file   Years of education: Not on file   Highest education level: Not on file  Occupational History   Not on file  Tobacco Use   Smoking status: Never   Smokeless tobacco: Never  Vaping Use   Vaping status: Never Used  Substance and Sexual Activity   Alcohol use: Not Currently   Drug use: Never   Sexual activity: Not Currently  Other Topics Concern   Not on file  Social History Narrative      Social Determinants of Health   Financial Resource Strain: Low Risk  (08/12/2022)   Received from [facility], [facility]   Overall Financial Resource Strain (CARDIA)    Difficulty of Paying Living Expenses: Not hard at all  Food Insecurity: No Food Insecurity (08/12/2022)   Received from [facility], [facility]   Hunger Vital Sign    Worried About Running Out of Food in the Last Year: Never true    Ran Out of Food in the Last Year: Never true  Transportation Needs: No Transportation Needs (08/12/2022)   Received from [facility], [facility]   [employer] - Transportation    In the past 12 months, has lack of transportation kept you from medical appointments or from getting medications?: No    Lack of Transportation (Non-Medical): No  Physical Activity: Inactive (09/27/2021)   Exercise Vital Sign    Days of Exercise per Week: 0 days    Minutes of Exercise per Session: 0 min  Stress: Stress Concern Present (09/27/2021)   [employer] of Occupational Health - Occupational Stress Questionnaire    Feeling of Stress : Rather much  Social Connections: Moderately Integrated (09/27/2021)   Social Connection and Isolation Panel [NHANES]    Frequency of Communication with Friends and Family: Three times a week    Frequency of Social Gatherings with Friends and Family: Three  times a week    Attends Religious Services: 1 to 4 times per year    Active Member of Clubs or Organizations: No    Attends Banker Meetings: 1 to 4 times per year    Marital Status: Divorced  Intimate Partner Violence: Not At Risk (09/27/2021)   Humiliation, Afraid, Rape, and Kick questionnaire    Fear of Current or Ex-Partner: No    Emotionally Abused: No    Physically Abused: No    Sexually Abused: No    FAMILY HISTORY: Family History  Problem Relation Age of Onset   Breast cancer Mother 53   Cancer Mother    Cancer Father    Lung cancer Father    Breast cancer Cousin        dx 58s maternal    ALLERGIES:  is allergic to doxycycline and aspirin.  MEDICATIONS:  Current Outpatient Medications  Medication Sig Dispense Refill   acetaminophen (TYLENOL) 650 MG CR tablet Take 1,300 mg by mouth every 8 (eight) hours as needed for pain.     albuterol (VENTOLIN HFA) 108 (90 Base) MCG/ACT inhaler Inhale 2 puffs into the lungs every 6 (six) hours as needed for  wheezing or shortness of breath.     azelastine (ASTELIN) 0.1 % nasal spray Place 2 sprays into both nostrils daily. Use in each nostril as directed 30 mL 0   Cranberry-Vitamin C-Probiotic (AZO CRANBERRY PO) Take by mouth.     docusate sodium (COLACE) 100 MG capsule Take 1 capsule (100 mg total) by mouth 2 (two) times daily. 60 capsule 1   empagliflozin (JARDIANCE) 25 MG TABS tablet Take 25 mg by mouth daily.     ferrous sulfate 325 (65 FE) MG EC tablet Take 325 mg by mouth daily with breakfast.     FLUoxetine (PROZAC) 10 MG capsule Take 10 mg by mouth daily.     fluticasone (FLONASE) 50 MCG/ACT nasal spray Place 1 spray into both nostrils daily as needed for allergies or rhinitis.     gabapentin (NEURONTIN) 100 MG capsule Take 1 capsule (100 mg total) by mouth 2 (two) times daily. (Patient taking differently: Take 100 mg by mouth daily.) 60 capsule 0   hydrochlorothiazide (HYDRODIURIL) 12.5 MG tablet Take 12.5 mg by  mouth daily.     hydrocortisone 2.5 % cream Apply topically 3 (three) times daily. 30 g 2   ibuprofen (ADVIL) 200 MG tablet Take 400 mg by mouth every 8 (eight) hours as needed for moderate pain.     Insulin Asp Prot & Asp FlexPen (NOVOLOG 70/30 MIX) (70-30) 100 UNIT/ML FlexPen Inject into the skin.     lidocaine-prilocaine (EMLA) cream Apply to affected area once 30 g 3   lisinopril (ZESTRIL) 40 MG tablet Take 40 mg by mouth at bedtime.     loratadine (CLARITIN) 10 MG tablet Take by mouth.     LORazepam (ATIVAN) 0.5 MG tablet Take 1 tablet (0.5 mg total) by mouth every 8 (eight) hours as needed for anxiety (nausea vomiting). 30 tablet 0   metFORMIN (GLUCOPHAGE-XR) 500 MG 24 hr tablet Take 1,000 mg by mouth at bedtime.     metoprolol succinate (TOPROL-XL) 25 MG 24 hr tablet Take 25 mg by mouth daily.     mupirocin ointment (BACTROBAN) 2 % Apply 1 Application topically 3 (three) times daily. 22 g 0   NOVOLIN 70/30 FLEXPEN (70-30) 100 UNIT/ML KwikPen 50-110 Units See admin instructions. 50 units in the morning, 110 units at bedtime     OZEMPIC, 1 MG/DOSE, 4 MG/3ML SOPN Inject 1 mg into the skin every Tuesday.     potassium chloride SA (KLOR-CON M) 20 MEQ tablet Take 1 tablet (20 mEq total) by mouth daily. 3 tablet 0   triamcinolone ointment (KENALOG) 0.5 % Apply 1 Application topically 2 (two) times daily. 30 g 0   fluconazole (DIFLUCAN) 150 MG tablet Take 1 tablet (150 mg total) by mouth daily. Take 1 tablet day one and repeat 2 days later. (Patient not taking: Reported on 11/21/2022) 2 tablet 0   ondansetron (ZOFRAN) 8 MG tablet Take 1 tablet (8 mg total) by mouth 2 (two) times daily as needed. Start on the third day after chemotherapy. (Patient not taking: Reported on 10/10/2022) 30 tablet 1   prochlorperazine (COMPAZINE) 10 MG tablet Take 1 tablet (10 mg total) by mouth every 6 (six) hours as needed (Nausea or vomiting). (Patient not taking: Reported on 10/10/2022) 30 tablet 1   traMADol (ULTRAM) 50  MG tablet Take 2 tablets (100 mg total) by mouth every 6 (six) hours as needed. (Patient not taking: Reported on 12/12/2022) 90 tablet 0   No current facility-administered medications for this visit.   Facility-Administered  Medications Ordered in Other Visits  Medication Dose Route Frequency Provider Last Rate Last Admin   heparin lock flush 100 UNIT/ML injection            sodium chloride flush (NS) 0.9 % injection 10 mL  10 mL Intracatheter PRN Rickard Patience, MD   10 mL at 12/12/22 1106     PHYSICAL EXAMINATION: ECOG PERFORMANCE STATUS: 1 - Symptomatic but completely ambulatory  Physical Exam Constitutional:      General: She is not in acute distress.    Appearance: She is obese.  HENT:     Head: Normocephalic and atraumatic.  Eyes:     General: No scleral icterus. Cardiovascular:     Rate and Rhythm: Regular rhythm. Tachycardia present.  Pulmonary:     Effort: Pulmonary effort is normal. No respiratory distress.     Breath sounds: No wheezing.  Abdominal:     General: Bowel sounds are normal. There is no distension.     Palpations: Abdomen is soft.  Musculoskeletal:        General: No deformity. Normal range of motion.     Cervical back: Normal range of motion.     Right lower leg: Edema present.     Left lower leg: Edema present.     Comments:    Skin:    General: Skin is warm and dry.     Findings: Rash present.     Comments: Chronic venous sufficiency skin changes  Neurological:     Mental Status: She is alert and oriented to person, place, and time. Mental status is at baseline.     Cranial Nerves: No cranial nerve deficit.     Coordination: Coordination normal.  Psychiatric:        Mood and Affect: Mood normal.    LABORATORY DATA:  I have reviewed the data as listed    Latest Ref Rng & Units 12/12/2022    8:54 AM 11/21/2022    8:37 AM 10/31/2022    8:53 AM  CBC  WBC 4.0 - 10.5 K/uL 6.6  6.4  6.5   Hemoglobin 12.0 - 15.0 g/dL 72.5  36.6  44.0   Hematocrit 36.0  - 46.0 % 35.7  35.4  35.7   Platelets 150 - 400 K/uL 279  252  270       Latest Ref Rng & Units 12/12/2022    8:54 AM 11/21/2022    8:37 AM 10/31/2022    8:53 AM  CMP  Glucose 70 - 99 mg/dL 347  425  956   BUN 8 - 23 mg/dL 38  36  33   Creatinine 0.44 - 1.00 mg/dL 3.87  5.64  3.32   Sodium 135 - 145 mmol/L 138  138  137   Potassium 3.5 - 5.1 mmol/L 4.0  4.5  4.1   Chloride 98 - 111 mmol/L 109  108  109   CO2 22 - 32 mmol/L 17  20  18    Calcium 8.9 - 10.3 mg/dL 9.5  95.1  9.2   Total Protein 6.5 - 8.1 g/dL 7.2  7.1  7.1   Total Bilirubin 0.3 - 1.2 mg/dL 0.3  0.2  <8.8   Alkaline Phos 38 - 126 U/L 56  56  56   AST 15 - 41 U/L 22  22  21    ALT 0 - 44 U/L 18  19  18        RADIOGRAPHIC STUDIES: I have personally reviewed the radiological images as  listed and agreed with the findings in the report. No results found.

## 2022-12-12 NOTE — Assessment & Plan Note (Addendum)
Stable hemoglobin 

## 2022-12-12 NOTE — Assessment & Plan Note (Signed)
lipid poor hyperfunctioning adenoma vs mets.  FDG avid on PET scan.  Difficult biopsy due to patient's body habitus and deep position of the mass. Resolved on PET scan, suggesting that this lesion might be a metastatic lesion. Continue monitor  

## 2022-12-12 NOTE — Assessment & Plan Note (Signed)
Treatment plan as listed above. 

## 2022-12-18 ENCOUNTER — Other Ambulatory Visit: Payer: Self-pay

## 2022-12-22 ENCOUNTER — Ambulatory Visit
Admission: RE | Admit: 2022-12-22 | Discharge: 2022-12-22 | Disposition: A | Discharge status: Home / Self Care | Payer: Medicare PPO | Source: Ambulatory Visit | Attending: Oncology | Admitting: Oncology

## 2022-12-22 ENCOUNTER — Ambulatory Visit
Admission: RE | Admit: 2022-12-22 | Discharge: 2022-12-22 | Disposition: A | Discharge status: Home / Self Care | Payer: Medicare Other | Source: Ambulatory Visit | Attending: Radiation Oncology | Admitting: Radiation Oncology

## 2022-12-22 ENCOUNTER — Encounter: Payer: Self-pay | Admitting: Radiation Oncology

## 2022-12-22 VITALS — BP 130/76 | HR 96 | Temp 99.0°F | Resp 20 | Wt 288.0 lb

## 2022-12-22 DIAGNOSIS — Z923 Personal history of irradiation: Secondary | ICD-10-CM | POA: Diagnosis not present

## 2022-12-22 DIAGNOSIS — C541 Malignant neoplasm of endometrium: Secondary | ICD-10-CM | POA: Diagnosis present

## 2022-12-22 NOTE — Progress Notes (Signed)
Radiation Oncology Follow up Note  Name: Valerie Case   Date:   12/22/2022 MRN:  [id number] DOB: 05/26/1950    This 72 y.o. female presents to the clinic today for 62-month follow-up status post palliative radiation therapy to her pelvis for poorly differentiated endometrial adenocarcinoma stage III (cT3 NX M0)..  REFERRING PROVIDER: Barbette Reichmann, MD  HPI: Patient is a 72 year old female now at 13 months having completed palliative radiation therapy to her pelvis for poorly differentiated endometrial adenocarcinoma stage III.  Seen today in routine follow-up she is doing well specifically denies any increased lower urinary tract symptoms diarrhea or fatigue or any vaginal bleeding or discharge..  She is under observation by Dr. Sonia Side and medical oncology.  Her overall general condition is greatly improved.  She had a CT scan today which I have reviewed which looks clean with no evidence of progressive disease by my interpretation has not been formally read.  COMPLICATIONS OF TREATMENT: none  FOLLOW UP COMPLIANCE: keeps appointments   PHYSICAL EXAM:  BP 130/76   Pulse 96   Temp 99 F (37.2 C) (Tympanic)   Resp 20   Wt 288 lb (130.6 kg)   BMI 43.79 kg/m  Obese female in NAD.  Well-developed well-nourished patient in NAD. HEENT reveals PERLA, EOMI, discs not visualized.  Oral cavity is clear. No oral mucosal lesions are identified. Neck is clear without evidence of cervical or supraclavicular adenopathy. Lungs are clear to A&P. Cardiac examination is essentially unremarkable with regular rate and rhythm without murmur rub or thrill. Abdomen is benign with no organomegaly or masses noted. Motor sensory and DTR levels are equal and symmetric in the upper and lower extremities. Cranial nerves II through XII are grossly intact. Proprioception is intact. No peripheral adenopathy or edema is identified. No motor or sensory levels are noted. Crude visual fields are within normal  range.  RADIOLOGY RESULTS: CT scan reviewed compatible with above-stated findings  PLAN: Present time patient will continue to be observed.  Dr. Sonia Side is mention possibility of has a hysterectomy should disease recur.  At this time we will see her back in 6 months for follow-up.  She continues close follow-up care with met both medical oncology as well as GYN oncology.  Patient and daughter know to call with any concerns.  I would like to take this opportunity to thank you for allowing me to participate in the care of your patient.Carmina Miller, MD

## 2022-12-23 ENCOUNTER — Other Ambulatory Visit: Payer: Self-pay

## 2022-12-25 ENCOUNTER — Other Ambulatory Visit: Payer: Self-pay | Admitting: Internal Medicine

## 2022-12-25 DIAGNOSIS — Z1231 Encounter for screening mammogram for malignant neoplasm of breast: Secondary | ICD-10-CM

## 2022-12-26 ENCOUNTER — Ambulatory Visit
Admission: RE | Admit: 2022-12-26 | Discharge: 2022-12-26 | Disposition: A | Discharge status: Home / Self Care | Payer: Medicare PPO | Source: Ambulatory Visit | Attending: Internal Medicine | Admitting: Internal Medicine

## 2022-12-26 DIAGNOSIS — Z1231 Encounter for screening mammogram for malignant neoplasm of breast: Secondary | ICD-10-CM | POA: Insufficient documentation

## 2023-01-02 ENCOUNTER — Inpatient Hospital Stay (HOSPITAL_BASED_OUTPATIENT_CLINIC_OR_DEPARTMENT_OTHER): Payer: Medicare PPO | Admitting: Oncology

## 2023-01-02 ENCOUNTER — Encounter: Payer: Self-pay | Admitting: Oncology

## 2023-01-02 ENCOUNTER — Inpatient Hospital Stay: Payer: Medicare PPO

## 2023-01-02 ENCOUNTER — Inpatient Hospital Stay: Payer: Medicare PPO | Attending: Obstetrics and Gynecology

## 2023-01-02 VITALS — BP 133/56 | HR 103 | Temp 98.6°F | Resp 19 | Wt 286.6 lb

## 2023-01-02 VITALS — BP 144/60 | HR 87

## 2023-01-02 DIAGNOSIS — Z881 Allergy status to other antibiotic agents status: Secondary | ICD-10-CM | POA: Diagnosis not present

## 2023-01-02 DIAGNOSIS — Z86018 Personal history of other benign neoplasm: Secondary | ICD-10-CM | POA: Insufficient documentation

## 2023-01-02 DIAGNOSIS — D631 Anemia in chronic kidney disease: Secondary | ICD-10-CM | POA: Diagnosis not present

## 2023-01-02 DIAGNOSIS — N189 Chronic kidney disease, unspecified: Secondary | ICD-10-CM | POA: Diagnosis not present

## 2023-01-02 DIAGNOSIS — I2510 Atherosclerotic heart disease of native coronary artery without angina pectoris: Secondary | ICD-10-CM | POA: Insufficient documentation

## 2023-01-02 DIAGNOSIS — Z79899 Other long term (current) drug therapy: Secondary | ICD-10-CM | POA: Diagnosis not present

## 2023-01-02 DIAGNOSIS — Z5112 Encounter for antineoplastic immunotherapy: Secondary | ICD-10-CM

## 2023-01-02 DIAGNOSIS — E1122 Type 2 diabetes mellitus with diabetic chronic kidney disease: Secondary | ICD-10-CM | POA: Diagnosis not present

## 2023-01-02 DIAGNOSIS — Z79620 Long term (current) use of immunosuppressive biologic: Secondary | ICD-10-CM | POA: Insufficient documentation

## 2023-01-02 DIAGNOSIS — G629 Polyneuropathy, unspecified: Secondary | ICD-10-CM | POA: Insufficient documentation

## 2023-01-02 DIAGNOSIS — I129 Hypertensive chronic kidney disease with stage 1 through stage 4 chronic kidney disease, or unspecified chronic kidney disease: Secondary | ICD-10-CM | POA: Diagnosis not present

## 2023-01-02 DIAGNOSIS — E278 Other specified disorders of adrenal gland: Secondary | ICD-10-CM

## 2023-01-02 DIAGNOSIS — L723 Sebaceous cyst: Secondary | ICD-10-CM | POA: Insufficient documentation

## 2023-01-02 DIAGNOSIS — C541 Malignant neoplasm of endometrium: Secondary | ICD-10-CM | POA: Diagnosis not present

## 2023-01-02 DIAGNOSIS — Z809 Family history of malignant neoplasm, unspecified: Secondary | ICD-10-CM | POA: Insufficient documentation

## 2023-01-02 DIAGNOSIS — M2550 Pain in unspecified joint: Secondary | ICD-10-CM | POA: Diagnosis not present

## 2023-01-02 DIAGNOSIS — R160 Hepatomegaly, not elsewhere classified: Secondary | ICD-10-CM | POA: Diagnosis not present

## 2023-01-02 DIAGNOSIS — Z886 Allergy status to analgesic agent status: Secondary | ICD-10-CM | POA: Insufficient documentation

## 2023-01-02 DIAGNOSIS — R5383 Other fatigue: Secondary | ICD-10-CM | POA: Diagnosis not present

## 2023-01-02 DIAGNOSIS — Z801 Family history of malignant neoplasm of trachea, bronchus and lung: Secondary | ICD-10-CM | POA: Diagnosis not present

## 2023-01-02 DIAGNOSIS — Z923 Personal history of irradiation: Secondary | ICD-10-CM | POA: Insufficient documentation

## 2023-01-02 DIAGNOSIS — Z9089 Acquired absence of other organs: Secondary | ICD-10-CM | POA: Insufficient documentation

## 2023-01-02 DIAGNOSIS — I700 Atherosclerosis of aorta: Secondary | ICD-10-CM | POA: Insufficient documentation

## 2023-01-02 DIAGNOSIS — Z803 Family history of malignant neoplasm of breast: Secondary | ICD-10-CM | POA: Diagnosis not present

## 2023-01-02 LAB — COMPREHENSIVE METABOLIC PANEL
ALT: 19 U/L (ref 0–44)
AST: 21 U/L (ref 15–41)
Albumin: 3.8 g/dL (ref 3.5–5.0)
Alkaline Phosphatase: 56 U/L (ref 38–126)
Anion gap: 8 (ref 5–15)
BUN: 39 mg/dL — ABNORMAL HIGH (ref 8–23)
CO2: 19 mmol/L — ABNORMAL LOW (ref 22–32)
Calcium: 9.7 mg/dL (ref 8.9–10.3)
Chloride: 112 mmol/L — ABNORMAL HIGH (ref 98–111)
Creatinine, Ser: 1.14 mg/dL — ABNORMAL HIGH (ref 0.44–1.00)
GFR, Estimated: 51 mL/min — ABNORMAL LOW (ref >60)
Glucose, Bld: 187 mg/dL — ABNORMAL HIGH (ref 70–99)
Potassium: 4.3 mmol/L (ref 3.5–5.1)
Sodium: 139 mmol/L (ref 135–145)
Total Bilirubin: 0.3 mg/dL (ref 0.3–1.2)
Total Protein: 7.2 g/dL (ref 6.5–8.1)

## 2023-01-02 LAB — CBC WITH DIFFERENTIAL/PLATELET
Abs Immature Granulocytes: 0.07 10*3/uL (ref 0.00–0.07)
Basophils Absolute: 0 10*3/uL (ref 0.0–0.1)
Basophils Relative: 1 %
Eosinophils Absolute: 0.2 10*3/uL (ref 0.0–0.5)
Eosinophils Relative: 4 %
HCT: 34.8 % — ABNORMAL LOW (ref 36.0–46.0)
Hemoglobin: 11.2 g/dL — ABNORMAL LOW (ref 12.0–15.0)
Immature Granulocytes: 1 %
Lymphocytes Relative: 17 %
Lymphs Abs: 1.2 10*3/uL (ref 0.7–4.0)
MCH: 30.9 pg (ref 26.0–34.0)
MCHC: 32.2 g/dL (ref 30.0–36.0)
MCV: 96.1 fL (ref 80.0–100.0)
Monocytes Absolute: 0.6 10*3/uL (ref 0.1–1.0)
Monocytes Relative: 9 %
Neutro Abs: 4.7 10*3/uL (ref 1.7–7.7)
Neutrophils Relative %: 68 %
Platelets: 275 10*3/uL (ref 150–400)
RBC: 3.62 MIL/uL — ABNORMAL LOW (ref 3.87–5.11)
RDW: 13.8 % (ref 11.5–15.5)
WBC: 6.9 10*3/uL (ref 4.0–10.5)
nRBC: 0 % (ref 0.0–0.2)

## 2023-01-02 LAB — TSH: TSH: 1.581 u[IU]/mL (ref 0.350–4.500)

## 2023-01-02 MED ORDER — SODIUM CHLORIDE 0.9 % IV SOLN
200.0000 mg | Freq: Once | INTRAVENOUS | Status: AC
  Administered 2023-01-02: 200 mg via INTRAVENOUS
  Filled 2023-01-02: qty 8

## 2023-01-02 MED ORDER — SODIUM CHLORIDE 0.9% FLUSH
10.0000 mL | INTRAVENOUS | Status: DC | PRN
  Administered 2023-01-02: 10 mL
  Filled 2023-01-02: qty 10

## 2023-01-02 MED ORDER — HEPARIN SOD (PORK) LOCK FLUSH 100 UNIT/ML IV SOLN
500.0000 [IU] | Freq: Once | INTRAVENOUS | Status: AC | PRN
  Administered 2023-01-02: 500 [IU]
  Filled 2023-01-02: qty 5

## 2023-01-02 MED ORDER — SODIUM CHLORIDE 0.9 % IV SOLN
Freq: Once | INTRAVENOUS | Status: AC
  Filled 2023-01-02: qty 250

## 2023-01-02 NOTE — Assessment & Plan Note (Signed)
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 s/p carboplatin and Taxol for 5 cycles, on Keytruda maintenance.  Labs are reviewed and discussed with patient. Proceed with Keytruda CT shows stable disease/NED

## 2023-01-02 NOTE — Progress Notes (Signed)
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 s/p carboplatin and Taxol for 5 cycles, on Keytruda maintenance.  Labs are reviewed and discussed with patient. Proceed with Keytruda CT shows stable disease/NED   Adrenal mass (HCC) lipid poor hyperfunctioning adenoma vs mets.  FDG avid on PET scan.  Difficult biopsy due to patient's body habitus and deep position of the mass. Resolved on PET scan, suggesting that this lesion might be a metastatic lesion. CT showed stable lesion  Continue monitor   Encounter for antineoplastic immunotherapy Treatment plan as listed above.   Neuropathy Trial of gabapentin 100mg  BID.    Orders Placed This Encounter  Procedures   CBC with Differential    Standing Status:   Future    Standing Expiration Date:   03/18/2024   Comprehensive metabolic panel    Standing Status:   Future    Standing Expiration Date:   03/18/2024   TSH    Standing Status:   Future    Standing Expiration Date:   03/18/2024   CBC with Differential    Standing Status:   Future    Standing Expiration Date:   04/08/2024   Comprehensive metabolic panel    Standing Status:   Future    Standing Expiration Date:   04/08/2024   TSH    Standing Status:   Future    Standing Expiration Date:   04/08/2024    Return of visit:  3 weeks Lab MD Greggory Brandy, MD, [name] [facility] Hematology Oncology 01/02/2023     HISTORY OF  PRESENTING ILLNESS:   Valerie Case is a  72 y.o.  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 Oncology History  Endometrial adenocarcinoma (HCC)  12/12/2020 Imaging   12/12/2020, CT abdomen pelvis with contrast showed no radiographic evidence of urinary tract neoplasm, calculi, hydronephrosis.  2.6 cm nonspecific left adrenal mass. 02/12/2021 CT hematuria work-up showed small uterine fibroids, no findings to explain hematuria.Hepatomegaly, diverticulosis without evidence of diverticulitis, Coronary artery disease   04/01/2021 -  Hospital Admission   04/01/2021 - 04/03/2021, patient was hospitalized due to symptomatic anemia, hemoglobin 6.9, heavy postmenopausal bleeding with passing large clots.  She also presented with increased creatinine level to 2.58 compared to her baseline level of 0.9 in November 2022.  Patient received IV iron infusion and 2 units of PRBC during the hospital stay..  04/02/2021, iron panel showed iron saturation 37, ferritin 37, TIBC 333-the studies were done after patient received blood transfusion.  At discharge, hemoglobin was 7.7.   04/01/2021 Initial Diagnosis   04/01/2020, endometrial biopsy showed poorly differentiated endometrial adenocarcinoma. Omniseq NGS showed TMB 48.4 mt/mb [high], MSI High, PD-L1-TPS <1%, PIK3CA H1047Q, Negative for BRAF, HER2, NTRK1 RET.     04/10/2021 Cancer Staging   Staging form: Corpus Uteri - Carcinoma and Carcinosarcoma, AJCC 8th Edition - Clinical stage from 04/10/2021: FIGO Stage III, calculated as Stage Unknown (cT3, cNX, cM0) - Signed by Rickard Patience, MD on 08/24/2021  Stage prefix: Initial diagnosis   04/15/2021 Imaging   PET showed 1. Large hypermetabolic endometrial mass consistent with known endometrial cancer. Suspect direct invasion/involvement of the right adnexa. Right pelvic sidewall hypermetabolic adenopathy.2. Enlarged and hypermetabolic left adrenal gland lesion could reflect  a lipid poor hyperfunctioning adenoma but metastasis is also possible. 3. No findings for metastatic disease involving the chest or bonystructures.     04/24/2021 - 05/29/2021 Radiation Therapy   status post radiation to pelvis   08/05/2021 Imaging   MRI abdomen w wo contrast 1. Stable solid enhancing 2.7 cm left adrenal lesion, which was hypermetabolic on prior PET/CTs but is unchanged in size dating back to December 12, 2020. 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 However, unfortunately metastatic disease can not be entirely excluded and remains a pertinent differential consideration. Comparison with more remote prior imaging would be the most valuable tool in the assessment of this lesion, as demonstrating long-term stability would indicate this to be a benign lesion. However, if no prior imaging can be made available, would consider follow-up adrenal protocol CT with and without intravenous contrast material in 3 months as this would allow for further assessment of stability as well as enhancement and washout characteristics of the lesion potentially allowing for better characterization versus direct tissue sampling.2. Hepatomegaly and hepatic steatosis.3. Colonic diverticulosis without findings of acute diverticulitis    08/16/2021 - 09/27/2021 Chemotherapy   UTERINE Carboplatin AUC 5 / Paclitaxel q21d x 3      10/03/2021 Imaging   PET 1. No residual hypermetabolism in the uterus suggesting an excellent response to treatment. No findings for metastatic disease. 2. Resolution of hypermetabolism in the left adrenal gland lesion suggesting this was metastatic disease. 3. Diffuse marrow hypermetabolism likely due to rebound from chemotherapy or marrow stimulating drugs   10/18/2021 - 11/08/2021 Chemotherapy   AUC 5 / Paclitaxel/  Keytruda Q21d  x 2 cycles   11/29/2021 -  Chemotherapy   Patient is on Treatment Plan : UTERINE Pembrolizumab (200) q21d     04/30/2022 Imaging   CT chest abdomen pelvis w contrast  1. Similar to minimal decrease in size of a left adrenal nodule which was felt suspicious for metastatic disease on 10/03/2021 PET. 2. No evidence of new or progressive disease. 3. Coronary artery atherosclerosis. Aortic Atherosclerosis (ICD10-I70.0). 4. Incidental findings, including: Hepatomegaly. Possible constipation.   08/28/2022 Imaging   CT chest abdomen pelvis w contrast showed 1. Similar appearance of indeterminate left adrenal nodule. 2. Otherwise, no evidence of metastatic disease. 3. Hepatomegaly 4.  Possible constipation. 5. Coronary artery atherosclerosis. Aortic Atherosclerosis (ICD10-I70.0).       INTERVAL HISTORY Valerie Case is a 72 y.o. female who has above history reviewed by me today presents for follow up visit for anemia and endometrial cancer Overall she tolerates treatment Patient denies nausea vomiting diarrhea. Heart rate was high at 106 in clinic today.    Review of Systems  Constitutional:  Positive for fatigue. Negative for chills and fever.  HENT:   Negative for hearing loss and voice change.   Eyes:  Negative for eye problems.  Respiratory:  Negative for chest tightness and cough.   Cardiovascular:  Negative for chest pain.  Gastrointestinal:  Negative for abdominal distention, abdominal pain and blood in stool.  Endocrine: Negative for hot flashes.  Genitourinary:  Negative for difficulty urinating,  frequency and vaginal bleeding.   Musculoskeletal:  Positive for arthralgias.  Skin:  Negative for itching and rash.  Neurological:  Negative for extremity weakness and headaches.  Hematological:  Negative for adenopathy.  Psychiatric/Behavioral:  Negative for confusion.     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    SURGICAL HISTORY: Past Surgical History:  Procedure Laterality Date   CESAREAN SECTION  01/23/1979   IR IMAGING GUIDED PORT INSERTION  08/09/2021   TONSILLECTOMY  03/17/1956    SOCIAL HISTORY: Social History   Socioeconomic History   Marital status: Divorced    Spouse name: Not on file   Number of children: Not on file   Years of education: Not on file   Highest education level: Not on file  Occupational History   Not on file  Tobacco Use   Smoking status: Never   Smokeless tobacco: Never  Vaping Use   Vaping status: Never Used  Substance and Sexual Activity   Alcohol use: Not Currently   Drug use: Never   Sexual activity: Not Currently  Other Topics Concern   Not on file  Social History Narrative      Social Determinants of Health   Financial Resource Strain: Low Risk  (08/12/2022)   Received from [facility], [facility]   Overall Financial Resource Strain (CARDIA)    Difficulty of Paying Living Expenses: Not hard at all  Food Insecurity: No Food Insecurity (08/12/2022)   Received from [facility], [facility]   Hunger Vital Sign    Worried About Running Out of Food in the Last Year: Never true    Ran Out of Food in the Last Year: Never true  Transportation Needs: No Transportation Needs (08/12/2022)   Received from [facility], [facility]   [facility] - Transportation    In the past 12 months, has lack of transportation kept you from medical appointments or from getting medications?: No    Lack of Transportation (Non-Medical): No  Physical Activity: Inactive (09/27/2021)   Exercise Vital Sign    Days of Exercise per Week: 0  days    Minutes of Exercise per Session: 0 min  Stress: Stress Concern Present (09/27/2021)   [employer] of Occupational [employer] - Occupational Stress Questionnaire    Feeling of Stress : Rather much  Social Connections: Moderately Integrated (09/27/2021)   Social Connection and Isolation Panel [NHANES]    Frequency of Communication with Friends and Family: Three times a week    Frequency of Social Gatherings with Friends and Family: Three times a week    Attends Religious Services: 1 to 4 times per year    Active Member of Clubs or Organizations: No    Attends Banker Meetings: 1 to 4 times per year    Marital Status: Divorced  Intimate Partner Violence: Not At Risk (09/27/2021)   Humiliation, Afraid, Rape, and Kick questionnaire    Fear of Current or Ex-Partner: No    Emotionally Abused: No    Physically Abused: No    Sexually Abused: No    FAMILY HISTORY: Family History  Problem Relation Age  of Onset   Breast cancer Mother 40   Cancer Mother    Cancer Father    Lung cancer Father    Breast cancer Cousin        dx 10s maternal    ALLERGIES:  is allergic to doxycycline and aspirin.  MEDICATIONS:  Current Outpatient Medications  Medication Sig Dispense Refill   acetaminophen (TYLENOL) 650 MG CR tablet Take 1,300 mg by mouth every 8 (eight) hours as needed for pain.     albuterol (VENTOLIN HFA) 108 (90 Base) MCG/ACT inhaler Inhale 2 puffs into the lungs every 6 (six) hours as needed for wheezing or shortness of breath.     azelastine (ASTELIN) 0.1 % nasal spray Place 2 sprays into both nostrils daily. Use in each nostril as directed 30 mL 0   Cranberry-Vitamin C-Probiotic (AZO CRANBERRY PO) Take by mouth.     docusate sodium (COLACE) 100 MG capsule Take 1 capsule (100 mg total) by mouth 2 (two) times daily. 60 capsule 1   empagliflozin (JARDIANCE) 25 MG TABS tablet Take 25 mg by mouth daily.     ferrous sulfate 325 (65 FE) MG EC tablet Take 325 mg by mouth  daily with breakfast.     FLUoxetine (PROZAC) 10 MG capsule Take 10 mg by mouth daily.     fluticasone (FLONASE) 50 MCG/ACT nasal spray Place 1 spray into both nostrils daily as needed for allergies or rhinitis.     gabapentin (NEURONTIN) 100 MG capsule Take 1 capsule (100 mg total) by mouth 2 (two) times daily. (Patient taking differently: Take 100 mg by mouth daily.) 60 capsule 0   hydrochlorothiazide (HYDRODIURIL) 12.5 MG tablet Take 12.5 mg by mouth daily.     hydrocortisone 2.5 % cream Apply topically 3 (three) times daily. 30 g 2   ibuprofen (ADVIL) 200 MG tablet Take 400 mg by mouth every 8 (eight) hours as needed for moderate pain.     Insulin Asp Prot & Asp FlexPen (NOVOLOG 70/30 MIX) (70-30) 100 UNIT/ML FlexPen Inject into the skin.     lidocaine-prilocaine (EMLA) cream Apply to affected area once 30 g 3   lisinopril (ZESTRIL) 40 MG tablet Take 40 mg by mouth at bedtime.     loratadine (CLARITIN) 10 MG tablet Take by mouth.     LORazepam (ATIVAN) 0.5 MG tablet Take 1 tablet (0.5 mg total) by mouth every 8 (eight) hours as needed for anxiety (nausea vomiting). 30 tablet 0   metFORMIN (GLUCOPHAGE-XR) 500 MG 24 hr tablet Take 1,000 mg by mouth at bedtime.     metoprolol succinate (TOPROL-XL) 25 MG 24 hr tablet Take 25 mg by mouth daily.     mupirocin ointment (BACTROBAN) 2 % Apply 1 Application topically 3 (three) times daily. 22 g 0   NOVOLIN 70/30 FLEXPEN (70-30) 100 UNIT/ML KwikPen 50-110 Units See admin instructions. 50 units in the morning, 110 units at bedtime     ondansetron (ZOFRAN) 8 MG tablet Take 1 tablet (8 mg total) by mouth 2 (two) times daily as needed. Start on the third day after chemotherapy. 30 tablet 1   OZEMPIC, 1 MG/DOSE, 4 MG/3ML SOPN Inject 1 mg into the skin every Tuesday.     potassium chloride SA (KLOR-CON M) 20 MEQ tablet Take 1 tablet (20 mEq total) by mouth daily. 3 tablet 0   prochlorperazine (COMPAZINE) 10 MG tablet Take 1 tablet (10 mg total) by mouth every  6 (six) hours as needed (Nausea or vomiting). 30 tablet 1  traMADol (ULTRAM) 50 MG tablet Take 2 tablets (100 mg total) by mouth every 6 (six) hours as needed. 90 tablet 0   triamcinolone ointment (KENALOG) 0.5 % Apply 1 Application topically 2 (two) times daily. 30 g 0   fluconazole (DIFLUCAN) 150 MG tablet Take 1 tablet (150 mg total) by mouth daily. Take 1 tablet day one and repeat 2 days later. (Patient not taking: Reported on 01/02/2023) 2 tablet 0   No current facility-administered medications for this visit.   Facility-Administered Medications Ordered in Other Visits  Medication Dose Route Frequency Provider Last Rate Last Admin   heparin lock flush 100 UNIT/ML injection              PHYSICAL EXAMINATION: ECOG PERFORMANCE STATUS: 1 - Symptomatic but completely ambulatory  Physical Exam Constitutional:      General: She is not in acute distress.    Appearance: She is obese.  HENT:     Head: Normocephalic and atraumatic.  Eyes:     General: No scleral icterus. Cardiovascular:     Rate and Rhythm: Regular rhythm. Tachycardia present.     Heart sounds: Murmur heard.  Pulmonary:     Effort: Pulmonary effort is normal. No respiratory distress.  Abdominal:     General: Bowel sounds are normal. There is no distension.     Palpations: Abdomen is soft.  Musculoskeletal:        General: No deformity. Normal range of motion.     Cervical back: Normal range of motion.     Right lower leg: Edema present.     Left lower leg: Edema present.     Comments:    Skin:    General: Skin is warm and dry.     Findings: No rash.     Comments: Chronic venous sufficiency skin changes  Neurological:     Mental Status: She is alert and oriented to person, place, and time. Mental status is at baseline.     Cranial Nerves: No cranial nerve deficit.     Coordination: Coordination normal.  Psychiatric:        Mood and Affect: Mood normal.    LABORATORY DATA:  I have reviewed the data as  listed    Latest Ref Rng & Units 12/12/2022    8:54 AM 11/21/2022    8:37 AM 10/31/2022    8:53 AM  CBC  WBC 4.0 - 10.5 K/uL 6.6  6.4  6.5   Hemoglobin 12.0 - 15.0 g/dL 16.1  09.6  04.5   Hematocrit 36.0 - 46.0 % 35.7  35.4  35.7   Platelets 150 - 400 K/uL 279  252  270       Latest Ref Rng & Units 12/12/2022    8:54 AM 11/21/2022    8:37 AM 10/31/2022    8:53 AM  CMP  Glucose 70 - 99 mg/dL 409  811  914   BUN 8 - 23 mg/dL 38  36  33   Creatinine 0.44 - 1.00 mg/dL 7.82  9.56  2.13   Sodium 135 - 145 mmol/L 138  138  137   Potassium 3.5 - 5.1 mmol/L 4.0  4.5  4.1   Chloride 98 - 111 mmol/L 109  108  109   CO2 22 - 32 mmol/L 17  20  18    Calcium 8.9 - 10.3 mg/dL 9.5  08.6  9.2   Total Protein 6.5 - 8.1 g/dL 7.2  7.1  7.1   Total Bilirubin 0.3 -  1.2 mg/dL 0.3  0.2  <6.0   Alkaline Phos 38 - 126 U/L 56  56  56   AST 15 - 41 U/L 22  22  21    ALT 0 - 44 U/L 18  19  18        RADIOGRAPHIC STUDIES: I have personally reviewed the radiological images as listed and agreed with the findings in the report. MM 3D SCREENING MAMMOGRAM BILATERAL BREAST  Result Date: 12/30/2022 CLINICAL DATA:  Screening. EXAM: DIGITAL SCREENING BILATERAL MAMMOGRAM WITH TOMOSYNTHESIS AND CAD TECHNIQUE: Bilateral screening digital craniocaudal and mediolateral oblique mammograms were obtained. Bilateral screening digital breast tomosynthesis was performed. The images were evaluated with computer-aided detection. COMPARISON:  Previous exam(s). ACR Breast Density Category a: The breasts are almost entirely fatty. FINDINGS: There are no findings suspicious for malignancy. IMPRESSION: No mammographic evidence of malignancy. A result letter of this screening mammogram will be mailed directly to the patient. RECOMMENDATION: Screening mammogram in one year. (Code:SM-B-01Y) BI-RADS CATEGORY  1: Negative. Electronically Signed   By: Edwin Cap M.D.   On: 12/30/2022 13:06   CT CHEST ABDOMEN PELVIS WO CONTRAST  Addendum  Date: 12/22/2022   ADDENDUM REPORT: 12/22/2022 14:29 ADDENDUM: Mentioned in the body of the report but not the impression: There is similar nodular enlargement of the left lobe of the thyroid, suggest further evaluation with thyroid ultrasound if not previously performed. Electronically Signed   By: Maudry Mayhew M.D.   On: 12/22/2022 14:29   Result Date: 12/22/2022 CLINICAL DATA:  History of endometrial adenocarcinoma, follow-up. * Tracking Code: BO * EXAM: CT CHEST, ABDOMEN AND PELVIS WITHOUT CONTRAST TECHNIQUE: Multidetector CT imaging of the chest, abdomen and pelvis was performed following the standard protocol without IV contrast. RADIATION DOSE REDUCTION: This exam was performed according to the departmental dose-optimization program which includes automated exposure control, adjustment of the mA and/or kV according to patient size and/or use of iterative reconstruction technique. COMPARISON:  Multiple priors including most recent CT August 22, 2022 FINDINGS: CT CHEST FINDINGS Cardiovascular: Right chest Port-A-Cath with tip at the superior cavoatrial junction. Normal size heart. Three-vessel coronary artery calcifications. Mediastinum/Nodes: No supraclavicular adenopathy. Similar irregular lobularity along the left side of the thyroid gland. No pathologically enlarged mediastinal, hilar or axillary lymph nodes. The esophagus is grossly unremarkable. Lungs/Pleura: Left lower lobe pulmonary nodule measuring 3 mm on image 91/3 is stable over multiple prior examinations dating back to at least 01/22/2022 favoring a benign etiology. No new suspicious pulmonary nodules or masses. Scattered atelectasis/scarring. Minimal biapical pleuroparenchymal scarring. Musculoskeletal: No aggressive lytic or blastic lesion of bone. Multilevel degenerative change of the spine. Similar subcutaneous nodules about the posterior right and midline chest wall measuring up to 11 mm favoring sebaceous cysts. CT ABDOMEN PELVIS FINDINGS  Hepatobiliary: Hypodense segment 4 a hepatic lesion now measures 15 mm on image 55/2 previously 3.1 cm favored to reflect an involuting cyst. Gallbladder is unremarkable. No biliary ductal dilation. Pancreas: No pancreatic ductal dilation or evidence of acute inflammation. Spleen: No splenomegaly. Adrenals/Urinary Tract: Left adrenal nodule measures 2.7 x 2.3 cm on image 56/2 previously 2.8 x 2.4 cm. Right adrenal gland appears normal. No hydronephrosis. Urinary bladder is unremarkable for degree of distension. Stomach/Bowel: Stomach is unremarkable for degree of distension. No pathologic dilation of small or large bowel. Colonic diverticulosis without findings of acute diverticulitis. No evidence of acute bowel inflammation. Vascular/Lymphatic: Aortic atherosclerosis. Normal caliber abdominal aorta. Smooth IVC contours. No pathologically enlarged abdominal or pelvic lymph nodes. Reproductive: No discrete  uterine mass identified. No suspicious adnexal lesion. Other: No significant abdominopelvic free fluid. No discrete peritoneal or omental nodularity. Laxity of the pelvic floor. Musculoskeletal: No aggressive lytic or blastic lesion of bone. Multilevel degenerative change of the spine. IMPRESSION: 1. Stable indeterminate left adrenal nodule. 2. Otherwise, no noncontrast enhanced CT evidence of metastatic disease. 3. Colonic diverticulosis without findings of acute diverticulitis. 4.  Aortic Atherosclerosis (ICD10-I70.0). Electronically Signed: By: Maudry Mayhew M.D. On: 12/22/2022 14:24

## 2023-01-02 NOTE — Assessment & Plan Note (Signed)
Trial of gabapentin 100mg  BID.

## 2023-01-02 NOTE — Assessment & Plan Note (Signed)
Treatment plan as listed above. 

## 2023-01-02 NOTE — Assessment & Plan Note (Signed)
lipid poor hyperfunctioning adenoma vs mets.  FDG avid on PET scan.  Difficult biopsy due to patient's body habitus and deep position of the mass. Resolved on PET scan, suggesting that this lesion might be a metastatic lesion. CT showed stable lesion  Continue monitor

## 2023-01-02 NOTE — Patient Instructions (Signed)
[facility]  Discharge Instructions: Thank you for choosing [facility] to provide your oncology and hematology care.  If you have a lab appointment with the Cancer Center, please go directly to the Cancer Center and check in at the registration area.  Wear comfortable clothing and clothing appropriate for easy access to any Portacath or PICC line.   We strive to give you quality time with your provider. You may need to reschedule your appointment if you arrive late (15 or more minutes).  Arriving late affects you and other patients whose appointments are after yours.  Also, if you miss three or more appointments without notifying the office, you may be dismissed from the clinic at the provider's discretion.      For prescription refill requests, have your pharmacy contact our office and allow 72 hours for refills to be completed.    Today you received the following chemotherapy and/or immunotherapy agents- keytruda      To help prevent nausea and vomiting after your treatment, we encourage you to take your nausea medication as directed.  BELOW ARE SYMPTOMS THAT SHOULD BE REPORTED IMMEDIATELY: *FEVER GREATER THAN 100.4 F (38 C) OR HIGHER *CHILLS OR SWEATING *NAUSEA AND VOMITING THAT IS NOT CONTROLLED WITH YOUR NAUSEA MEDICATION *UNUSUAL SHORTNESS OF BREATH *UNUSUAL BRUISING OR BLEEDING *URINARY PROBLEMS (pain or burning when urinating, or frequent urination) *BOWEL PROBLEMS (unusual diarrhea, constipation, pain near the anus) TENDERNESS IN MOUTH AND THROAT WITH OR WITHOUT PRESENCE OF ULCERS (sore throat, sores in mouth, or a toothache) UNUSUAL RASH, SWELLING OR PAIN  UNUSUAL VAGINAL DISCHARGE OR ITCHING   Items with * indicate a potential emergency and should be followed up as soon as possible or go to the Emergency Department if any problems should occur.  Please show the CHEMOTHERAPY ALERT CARD or IMMUNOTHERAPY ALERT CARD at check-in to  the Emergency Department and triage nurse.  Should you have questions after your visit or need to cancel or reschedule your appointment, please contact [facility]  [phone] and follow the prompts.  Office hours are 8:00 a.m. to 4:30 p.m. Monday - Friday. Please note that voicemails left after 4:00 p.m. may not be returned until the following business day.  We are closed weekends and major holidays. You have access to a nurse at all times for urgent questions. Please call the main number to the clinic [phone] and follow the prompts.  For any non-urgent questions, you may also contact your provider using MyChart. We now offer e-Visits for anyone 18 and older to request care online for non-urgent symptoms. For details visit [email or web address].   Also download the MyChart app! Go to the app store, search "MyChart", open the app, select , and log in with your MyChart username and password.    

## 2023-01-02 NOTE — Assessment & Plan Note (Signed)
Stable hemoglobin 

## 2023-01-23 ENCOUNTER — Inpatient Hospital Stay: Payer: Medicare PPO

## 2023-01-23 ENCOUNTER — Encounter: Payer: Self-pay | Admitting: Oncology

## 2023-01-23 ENCOUNTER — Inpatient Hospital Stay: Payer: Medicare PPO | Attending: Obstetrics and Gynecology

## 2023-01-23 ENCOUNTER — Inpatient Hospital Stay (HOSPITAL_BASED_OUTPATIENT_CLINIC_OR_DEPARTMENT_OTHER): Payer: Medicare PPO | Admitting: Oncology

## 2023-01-23 VITALS — BP 129/61 | HR 99 | Temp 98.4°F | Resp 18 | Wt 283.2 lb

## 2023-01-23 DIAGNOSIS — E279 Disorder of adrenal gland, unspecified: Secondary | ICD-10-CM | POA: Diagnosis not present

## 2023-01-23 DIAGNOSIS — E278 Other specified disorders of adrenal gland: Secondary | ICD-10-CM

## 2023-01-23 DIAGNOSIS — Z5112 Encounter for antineoplastic immunotherapy: Secondary | ICD-10-CM | POA: Diagnosis present

## 2023-01-23 DIAGNOSIS — Z801 Family history of malignant neoplasm of trachea, bronchus and lung: Secondary | ICD-10-CM | POA: Insufficient documentation

## 2023-01-23 DIAGNOSIS — Z881 Allergy status to other antibiotic agents status: Secondary | ICD-10-CM | POA: Insufficient documentation

## 2023-01-23 DIAGNOSIS — Z923 Personal history of irradiation: Secondary | ICD-10-CM | POA: Insufficient documentation

## 2023-01-23 DIAGNOSIS — N189 Chronic kidney disease, unspecified: Secondary | ICD-10-CM | POA: Insufficient documentation

## 2023-01-23 DIAGNOSIS — D631 Anemia in chronic kidney disease: Secondary | ICD-10-CM | POA: Insufficient documentation

## 2023-01-23 DIAGNOSIS — C541 Malignant neoplasm of endometrium: Secondary | ICD-10-CM

## 2023-01-23 DIAGNOSIS — I700 Atherosclerosis of aorta: Secondary | ICD-10-CM | POA: Diagnosis not present

## 2023-01-23 DIAGNOSIS — Z79899 Other long term (current) drug therapy: Secondary | ICD-10-CM | POA: Diagnosis not present

## 2023-01-23 DIAGNOSIS — Z803 Family history of malignant neoplasm of breast: Secondary | ICD-10-CM | POA: Diagnosis not present

## 2023-01-23 DIAGNOSIS — K760 Fatty (change of) liver, not elsewhere classified: Secondary | ICD-10-CM | POA: Insufficient documentation

## 2023-01-23 DIAGNOSIS — Z9089 Acquired absence of other organs: Secondary | ICD-10-CM | POA: Diagnosis not present

## 2023-01-23 DIAGNOSIS — Z86018 Personal history of other benign neoplasm: Secondary | ICD-10-CM | POA: Insufficient documentation

## 2023-01-23 DIAGNOSIS — K5730 Diverticulosis of large intestine without perforation or abscess without bleeding: Secondary | ICD-10-CM | POA: Insufficient documentation

## 2023-01-23 DIAGNOSIS — E1122 Type 2 diabetes mellitus with diabetic chronic kidney disease: Secondary | ICD-10-CM | POA: Insufficient documentation

## 2023-01-23 DIAGNOSIS — Z886 Allergy status to analgesic agent status: Secondary | ICD-10-CM | POA: Insufficient documentation

## 2023-01-23 DIAGNOSIS — L723 Sebaceous cyst: Secondary | ICD-10-CM | POA: Insufficient documentation

## 2023-01-23 DIAGNOSIS — I129 Hypertensive chronic kidney disease with stage 1 through stage 4 chronic kidney disease, or unspecified chronic kidney disease: Secondary | ICD-10-CM | POA: Diagnosis not present

## 2023-01-23 DIAGNOSIS — G629 Polyneuropathy, unspecified: Secondary | ICD-10-CM

## 2023-01-23 LAB — COMPREHENSIVE METABOLIC PANEL
ALT: 21 U/L (ref 0–44)
AST: 22 U/L (ref 15–41)
Albumin: 4.1 g/dL (ref 3.5–5.0)
Alkaline Phosphatase: 56 U/L (ref 38–126)
Anion gap: 7 (ref 5–15)
BUN: 37 mg/dL — ABNORMAL HIGH (ref 8–23)
CO2: 19 mmol/L — ABNORMAL LOW (ref 22–32)
Calcium: 10.8 mg/dL — ABNORMAL HIGH (ref 8.9–10.3)
Chloride: 111 mmol/L (ref 98–111)
Creatinine, Ser: 1.14 mg/dL — ABNORMAL HIGH (ref 0.44–1.00)
GFR, Estimated: 51 mL/min — ABNORMAL LOW (ref >60)
Glucose, Bld: 131 mg/dL — ABNORMAL HIGH (ref 70–99)
Potassium: 4.9 mmol/L (ref 3.5–5.1)
Sodium: 137 mmol/L (ref 135–145)
Total Bilirubin: 0.4 mg/dL (ref <1.2)
Total Protein: 7.5 g/dL (ref 6.5–8.1)

## 2023-01-23 LAB — CBC WITH DIFFERENTIAL/PLATELET
Abs Immature Granulocytes: 0.04 10*3/uL (ref 0.00–0.07)
Basophils Absolute: 0 10*3/uL (ref 0.0–0.1)
Basophils Relative: 1 %
Eosinophils Absolute: 0.3 10*3/uL (ref 0.0–0.5)
Eosinophils Relative: 4 %
HCT: 36.1 % (ref 36.0–46.0)
Hemoglobin: 11.6 g/dL — ABNORMAL LOW (ref 12.0–15.0)
Immature Granulocytes: 1 %
Lymphocytes Relative: 20 %
Lymphs Abs: 1.4 10*3/uL (ref 0.7–4.0)
MCH: 30.8 pg (ref 26.0–34.0)
MCHC: 32.1 g/dL (ref 30.0–36.0)
MCV: 95.8 fL (ref 80.0–100.0)
Monocytes Absolute: 0.7 10*3/uL (ref 0.1–1.0)
Monocytes Relative: 10 %
Neutro Abs: 4.5 10*3/uL (ref 1.7–7.7)
Neutrophils Relative %: 64 %
Platelets: 297 10*3/uL (ref 150–400)
RBC: 3.77 MIL/uL — ABNORMAL LOW (ref 3.87–5.11)
RDW: 13.7 % (ref 11.5–15.5)
WBC: 6.8 10*3/uL (ref 4.0–10.5)
nRBC: 0 % (ref 0.0–0.2)

## 2023-01-23 LAB — TSH: TSH: 1.606 u[IU]/mL (ref 0.350–4.500)

## 2023-01-23 MED ORDER — GABAPENTIN 100 MG PO CAPS: 100.0000 mg | ORAL_CAPSULE | Freq: Every day | ORAL | 1 refills | Status: DC

## 2023-01-23 MED ORDER — SODIUM CHLORIDE 0.9 % IV SOLN
Freq: Once | INTRAVENOUS | Status: AC
  Filled 2023-01-23: qty 250

## 2023-01-23 MED ORDER — SODIUM CHLORIDE 0.9 % IV SOLN
200.0000 mg | Freq: Once | INTRAVENOUS | Status: AC
  Administered 2023-01-23: 200 mg via INTRAVENOUS
  Filled 2023-01-23: qty 200

## 2023-01-23 MED ORDER — HEPARIN SOD (PORK) LOCK FLUSH 100 UNIT/ML IV SOLN
500.0000 [IU] | Freq: Once | INTRAVENOUS | Status: AC | PRN
  Administered 2023-01-23: 500 [IU]
  Filled 2023-01-23: qty 5

## 2023-01-23 NOTE — Assessment & Plan Note (Addendum)
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 s/p carboplatin and Taxol for 5 cycles, on Keytruda maintenance.  Labs are reviewed and discussed with patient. CT shows stable disease/NED Proceed with Keytruda  nodular enlargement of the left lobe of the thyroid  check thyroid US

## 2023-01-23 NOTE — Progress Notes (Signed)
Hematology/Oncology Progress note [phone] [phone]      CHIEF COMPLAINTS/REASON FOR VISIT:  Follow up for endometrial cancer  ASSESSMENT & PLAN:   Cancer Staging  Endometrial adenocarcinoma [facility]) Staging form: Corpus Uteri - Carcinoma and Carcinosarcoma, AJCC 8th Edition - Clinical stage from 04/10/2021: FIGO Stage III, calculated as Stage Unknown (cT3, cNX, cM0) - Signed by Rickard Patience, MD on 08/24/2021   Endometrial adenocarcinoma (HCC) FIGO grade 3 poorly differentiated endometrial adenocarcinoma. s/p carboplatin and Taxol for 5 cycles, on Keytruda maintenance.  Labs are reviewed and discussed with patient. CT shows stable disease/NED Proceed with Keytruda  nodular enlargement of the left lobe of the thyroid  check thyroid US   Adrenal mass (HCC) lipid poor hyperfunctioning adenoma vs mets.  FDG avid on PET scan.  Difficult biopsy due to patient's body habitus and deep position of the mass. Resolved on PET scan, suggesting that this lesion might be a metastatic lesion. CT showed stable lesion  Continue monitor   Anemia in chronic kidney disease (CKD) Stable hemoglobin  Encounter for antineoplastic immunotherapy Treatment plan as listed above.   Neuropathy Trial of gabapentin 100mg  BID.    No orders of the defined types were placed in this encounter.   Return of visit:  3 weeks Lab MD Greggory Brandy, MD, [name] [facility] Hematology Oncology 01/23/2023     HISTORY OF PRESENTING ILLNESS:   Valerie Case is a  72 y.o.  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 Oncology History  Endometrial adenocarcinoma (HCC)  12/12/2020 Imaging   12/12/2020, CT abdomen pelvis with contrast showed no radiographic evidence of urinary tract neoplasm, calculi, hydronephrosis.  2.6 cm nonspecific left adrenal mass. 02/12/2021 CT hematuria work-up showed small uterine fibroids, no findings  to explain hematuria.Hepatomegaly, diverticulosis without evidence of diverticulitis, Coronary artery disease   04/01/2021 -  Hospital Admission   04/01/2021 - 04/03/2021, patient was hospitalized due to symptomatic anemia, hemoglobin 6.9, heavy postmenopausal bleeding with passing large clots.  She also presented with increased creatinine level to 2.58 compared to her baseline level of 0.9 in November 2022.  Patient received IV iron infusion and 2 units of PRBC during the hospital stay..  04/02/2021, iron panel showed iron saturation 37, ferritin 37, TIBC 333-the studies were done after patient received blood transfusion.  At discharge, hemoglobin was 7.7.   04/01/2021 Initial Diagnosis   04/01/2020, endometrial biopsy showed poorly differentiated endometrial adenocarcinoma. Omniseq NGS showed TMB 48.4 mt/mb [high], MSI High, PD-L1-TPS <1%, PIK3CA H1047Q, Negative for BRAF, HER2, NTRK1 RET.     04/10/2021 Cancer Staging   Staging form: Corpus Uteri - Carcinoma and Carcinosarcoma, AJCC 8th Edition - Clinical stage from 04/10/2021: FIGO Stage III, calculated as Stage Unknown (cT3, cNX, cM0) - Signed by Rickard Patience, MD on 08/24/2021 Stage prefix: Initial diagnosis   04/15/2021 Imaging   PET showed 1. Large hypermetabolic endometrial mass consistent with known endometrial cancer. Suspect direct invasion/involvement of the right adnexa. Right pelvic sidewall hypermetabolic adenopathy.2. 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 3. No findings for metastatic disease involving the chest or bonystructures.     04/24/2021 - 05/29/2021 Radiation Therapy   status post radiation to pelvis   08/05/2021 Imaging   MRI abdomen w wo contrast 1. Stable solid enhancing 2.7 cm left adrenal lesion, which was hypermetabolic on prior PET/CTs but is unchanged in  size dating back to December 12, 2020. While the imaging characteristics  are again nonspecific, given its relative stability since September 2022 and the relative rarity of isolated adrenal metastases this is favored to reflect a lipid poor adenoma. However, unfortunately metastatic disease can not be entirely excluded and remains a pertinent differential consideration. Comparison with more remote prior imaging would be the most valuable tool in the assessment of this lesion, as demonstrating long-term stability would indicate this to be a benign lesion. However, if no prior imaging can be made available, would consider follow-up adrenal protocol CT with and without intravenous contrast material in 3 months as this would allow for further assessment of stability as well as enhancement and washout characteristics of the lesion potentially allowing for better characterization versus direct tissue sampling.2. Hepatomegaly and hepatic steatosis.3. Colonic diverticulosis without findings of acute diverticulitis    08/16/2021 - 09/27/2021 Chemotherapy   UTERINE Carboplatin AUC 5 / Paclitaxel q21d x 3      10/03/2021 Imaging   PET 1. No residual hypermetabolism in the uterus suggesting an excellent response to treatment. No findings for metastatic disease. 2. Resolution of hypermetabolism in the left adrenal gland lesion suggesting this was metastatic disease. 3. Diffuse marrow hypermetabolism likely due to rebound from chemotherapy or marrow stimulating drugs   10/18/2021 - 11/08/2021 Chemotherapy   AUC 5 / Paclitaxel/ Keytruda Q21d  x 2 cycles   11/29/2021 -  Chemotherapy   Patient is on Treatment Plan : UTERINE Pembrolizumab (200) q21d     04/30/2022 Imaging   CT chest abdomen pelvis w contrast  1. Similar to minimal decrease in size of a left adrenal nodule which was felt suspicious for metastatic disease on 10/03/2021 PET. 2. No evidence of new or progressive disease. 3. Coronary artery atherosclerosis. Aortic Atherosclerosis (ICD10-I70.0). 4. Incidental findings,  including: Hepatomegaly. Possible constipation.   08/28/2022 Imaging   CT chest abdomen pelvis w contrast showed 1. Similar appearance of indeterminate left adrenal nodule. 2. Otherwise, no evidence of metastatic disease. 3. Hepatomegaly 4.  Possible constipation. 5. Coronary artery atherosclerosis. Aortic Atherosclerosis (ICD10-I70.0).   12/22/2022 Imaging   CT chest abdomen pelvis w contrast showed 1. Stable indeterminate left adrenal nodule. 2. Otherwise, no noncontrast enhanced CT evidence of metastatic disease. 3. Colonic diverticulosis without findings of acute diverticulitis. 4.  Aortic Atherosclerosis (ICD10-I70.0).         INTERVAL HISTORY Valerie Case is a 72 y.o. female who has above history reviewed by me today presents for follow up visit for anemia and endometrial cancer Overall she tolerates treatment Patient denies nausea vomiting diarrhea.   Review of Systems  Constitutional:  Positive for fatigue. Negative for chills and fever.  HENT:   Negative for hearing loss and voice change.   Eyes:  Negative for eye problems.  Respiratory:  Negative for chest tightness and cough.   Cardiovascular:  Negative for chest pain.  Gastrointestinal:  Negative for abdominal distention, abdominal pain and blood in stool.  Endocrine: Negative for hot flashes.  Genitourinary:  Negative for difficulty urinating, frequency and vaginal bleeding.   Musculoskeletal:  Positive for arthralgias.  Skin:  Negative for itching and rash.  Neurological:  Negative for extremity weakness and headaches.  Hematological:  Negative for adenopathy.  Psychiatric/Behavioral:  Negative for confusion.     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    SURGICAL HISTORY: Past Surgical History:  Procedure Laterality Date   CESAREAN SECTION  01/23/1979   IR IMAGING GUIDED PORT INSERTION  08/09/2021   TONSILLECTOMY  03/17/1956    SOCIAL HISTORY: Social History   Socioeconomic History   Marital status: Divorced    Spouse name: Not on file   Number of children: Not on file   Years of education: Not on file   Highest education level: Not on file  Occupational History   Not on file  Tobacco Use   Smoking status: Never   Smokeless tobacco: Never  Vaping Use   Vaping status: Never Used  Substance and Sexual Activity   Alcohol use: Not Currently   Drug use: Never   Sexual activity: Not Currently  Other Topics Concern   Not on file  Social History Narrative      Social Determinants of Health   Financial Resource Strain: Low Risk  (01/15/2023)   Received from [facility]   Overall Financial Resource Strain (CARDIA)    Difficulty of Paying Living Expenses: Not hard at all  Food Insecurity: No Food Insecurity (01/15/2023)   Received from [facility]   Hunger Vital Sign    Worried About Running Out of Food in the Last Year: Never true    Ran Out of Food in the Last Year: Never true  Transportation Needs: No Transportation Needs (01/15/2023)   Received from [facility] - Transportation    In the past 12 months, has lack of transportation kept you from medical appointments or from getting medications?: No    Lack of Transportation (Non-Medical): No  Physical Activity: Inactive (09/27/2021)   Exercise Vital Sign    Days of Exercise per Week: 0 days    Minutes of Exercise per Session: 0 min  Stress: Stress Concern Present (09/27/2021)   [employer] of Occupational Health - Occupational Stress Questionnaire    Feeling of Stress : Rather much  Social Connections: Moderately Integrated (09/27/2021)   Social Connection and Isolation  Panel [NHANES]    Frequency of Communication with Friends and Family: Three times a week    Frequency of Social Gatherings with Friends and Family: Three times a week    Attends Religious Services: 1 to 4 times per year    Active Member of Clubs or Organizations: No    Attends Banker Meetings: 1 to 4 times per year    Marital Status: Divorced  Intimate Partner Violence: Not At Risk (09/27/2021)   Humiliation, Afraid, Rape, and Kick questionnaire    Fear of Current or Ex-Partner: No    Emotionally Abused: No    Physically Abused: No    Sexually Abused: No    FAMILY HISTORY: Family History  Problem Relation Age of Onset   Breast cancer Mother 38   Cancer Mother    Cancer Father    Lung cancer Father    Breast cancer Cousin        dx 25s maternal    ALLERGIES:  is allergic to doxycycline and aspirin.  MEDICATIONS:  Current Outpatient Medications  Medication Sig Dispense Refill   acetaminophen (TYLENOL) 650 MG CR tablet Take 1,300 mg by mouth every 8 (eight) hours as needed for pain.     albuterol (VENTOLIN  HFA) 108 (90 Base) MCG/ACT inhaler Inhale 2 puffs into the lungs every 6 (six) hours as needed for wheezing or shortness of breath.     azelastine (ASTELIN) 0.1 % nasal spray Place 2 sprays into both nostrils daily. Use in each nostril as directed 30 mL 0   Cranberry-Vitamin C-Probiotic (AZO CRANBERRY PO) Take by mouth.     docusate sodium (COLACE) 100 MG capsule Take 1 capsule (100 mg total) by mouth 2 (two) times daily. 60 capsule 1   empagliflozin (JARDIANCE) 25 MG TABS tablet Take 25 mg by mouth daily.     ferrous sulfate 325 (65 FE) MG EC tablet Take 325 mg by mouth daily with breakfast.     FLUoxetine (PROZAC) 10 MG capsule Take 10 mg by mouth daily.     fluticasone (FLONASE) 50 MCG/ACT nasal spray Place 1 spray into both nostrils daily as needed for allergies or rhinitis.     hydrochlorothiazide (HYDRODIURIL) 12.5 MG tablet Take 12.5 mg by mouth daily.      hydrocortisone 2.5 % cream Apply topically 3 (three) times daily. 30 g 2   ibuprofen (ADVIL) 200 MG tablet Take 400 mg by mouth every 8 (eight) hours as needed for moderate pain.     Insulin Asp Prot & Asp FlexPen (NOVOLOG 70/30 MIX) (70-30) 100 UNIT/ML FlexPen Inject into the skin.     lidocaine-prilocaine (EMLA) cream Apply to affected area once 30 g 3   lisinopril (ZESTRIL) 40 MG tablet Take 40 mg by mouth at bedtime.     loratadine (CLARITIN) 10 MG tablet Take by mouth.     LORazepam (ATIVAN) 0.5 MG tablet Take 1 tablet (0.5 mg total) by mouth every 8 (eight) hours as needed for anxiety (nausea vomiting). 30 tablet 0   metFORMIN (GLUCOPHAGE-XR) 500 MG 24 hr tablet Take 1,000 mg by mouth at bedtime.     metoprolol succinate (TOPROL-XL) 25 MG 24 hr tablet Take 25 mg by mouth daily.     mupirocin ointment (BACTROBAN) 2 % Apply 1 Application topically 3 (three) times daily. 22 g 0   NOVOLIN 70/30 FLEXPEN (70-30) 100 UNIT/ML KwikPen 50-110 Units See admin instructions. 50 units in the morning, 110 units at bedtime     ondansetron (ZOFRAN) 8 MG tablet Take 1 tablet (8 mg total) by mouth 2 (two) times daily as needed. Start on the third day after chemotherapy. 30 tablet 1   OZEMPIC, 1 MG/DOSE, 4 MG/3ML SOPN Inject 1 mg into the skin every Tuesday.     potassium chloride SA (KLOR-CON M) 20 MEQ tablet Take 1 tablet (20 mEq total) by mouth daily. 3 tablet 0   prochlorperazine (COMPAZINE) 10 MG tablet Take 1 tablet (10 mg total) by mouth every 6 (six) hours as needed (Nausea or vomiting). 30 tablet 1   traMADol (ULTRAM) 50 MG tablet Take 2 tablets (100 mg total) by mouth every 6 (six) hours as needed. 90 tablet 0   triamcinolone ointment (KENALOG) 0.5 % Apply 1 Application topically 2 (two) times daily. 30 g 0   fluconazole (DIFLUCAN) 150 MG tablet Take 1 tablet (150 mg total) by mouth daily. Take 1 tablet day one and repeat 2 days later. (Patient not taking: Reported on 01/02/2023) 2 tablet 0    gabapentin (NEURONTIN) 100 MG capsule Take 1 capsule (100 mg total) by mouth daily. 90 capsule 1   No current facility-administered medications for this visit.   Facility-Administered Medications Ordered in Other Visits  Medication Dose Route Frequency Provider  Last Rate Last Admin   heparin lock flush 100 UNIT/ML injection              PHYSICAL EXAMINATION: ECOG PERFORMANCE STATUS: 1 - Symptomatic but completely ambulatory  Physical Exam Constitutional:      General: She is not in acute distress.    Appearance: She is obese.  HENT:     Head: Normocephalic and atraumatic.  Eyes:     General: No scleral icterus. Cardiovascular:     Rate and Rhythm: Regular rhythm. Tachycardia present.     Heart sounds: Murmur heard.  Pulmonary:     Effort: Pulmonary effort is normal. No respiratory distress.  Abdominal:     General: Bowel sounds are normal. There is no distension.     Palpations: Abdomen is soft.  Musculoskeletal:        General: No deformity. Normal range of motion.     Cervical back: Normal range of motion.     Right lower leg: Edema present.     Left lower leg: Edema present.     Comments:    Skin:    General: Skin is warm and dry.     Findings: No rash.     Comments: Chronic venous sufficiency skin changes  Neurological:     Mental Status: She is alert and oriented to person, place, and time. Mental status is at baseline.     Cranial Nerves: No cranial nerve deficit.     Coordination: Coordination normal.  Psychiatric:        Mood and Affect: Mood normal.    LABORATORY DATA:  I have reviewed the data as listed    Latest Ref Rng & Units 01/23/2023   10:24 AM 01/02/2023    9:55 AM 12/12/2022    8:54 AM  CBC  WBC 4.0 - 10.5 K/uL 6.8  6.9  6.6   Hemoglobin 12.0 - 15.0 g/dL 72.5  36.6  44.0   Hematocrit 36.0 - 46.0 % 36.1  34.8  35.7   Platelets 150 - 400 K/uL 297  275  279       Latest Ref Rng & Units 01/23/2023   10:24 AM 01/02/2023    9:55 AM 12/12/2022     8:54 AM  CMP  Glucose 70 - 99 mg/dL 347  425  956   BUN 8 - 23 mg/dL 37  39  38   Creatinine 0.44 - 1.00 mg/dL 3.87  5.64  3.32   Sodium 135 - 145 mmol/L 137  139  138   Potassium 3.5 - 5.1 mmol/L 4.9  4.3  4.0   Chloride 98 - 111 mmol/L 111  112  109   CO2 22 - 32 mmol/L 19  19  17    Calcium 8.9 - 10.3 mg/dL 95.1  9.7  9.5   Total Protein 6.5 - 8.1 g/dL 7.5  7.2  7.2   Total Bilirubin <1.2 mg/dL 0.4  0.3  0.3   Alkaline Phos 38 - 126 U/L 56  56  56   AST 15 - 41 U/L 22  21  22    ALT 0 - 44 U/L 21  19  18        RADIOGRAPHIC STUDIES: I have personally reviewed the radiological images as listed and agreed with the findings in the report. MM 3D SCREENING MAMMOGRAM BILATERAL BREAST  Result Date: 12/30/2022 CLINICAL DATA:  Screening. EXAM: DIGITAL SCREENING BILATERAL MAMMOGRAM WITH TOMOSYNTHESIS AND CAD TECHNIQUE: Bilateral screening digital craniocaudal and mediolateral oblique mammograms  were obtained. Bilateral screening digital breast tomosynthesis was performed. The images were evaluated with computer-aided detection. COMPARISON:  Previous exam(s). ACR Breast Density Category a: The breasts are almost entirely fatty. FINDINGS: There are no findings suspicious for malignancy. IMPRESSION: No mammographic evidence of malignancy. A result letter of this screening mammogram will be mailed directly to the patient. RECOMMENDATION: Screening mammogram in one year. (Code:SM-B-01Y) BI-RADS CATEGORY  1: Negative. Electronically Signed   By: Edwin Cap M.D.   On: 12/30/2022 13:06   CT CHEST ABDOMEN PELVIS WO CONTRAST  Addendum Date: 12/22/2022   ADDENDUM REPORT: 12/22/2022 14:29 ADDENDUM: Mentioned in the body of the report but not the impression: There is similar nodular enlargement of the left lobe of the thyroid, suggest further evaluation with thyroid ultrasound if not previously performed. Electronically Signed   By: Maudry Mayhew M.D.   On: 12/22/2022 14:29   Result Date:  12/22/2022 CLINICAL DATA:  History of endometrial adenocarcinoma, follow-up. * Tracking Code: BO * EXAM: CT CHEST, ABDOMEN AND PELVIS WITHOUT CONTRAST TECHNIQUE: Multidetector CT imaging of the chest, abdomen and pelvis was performed following the standard protocol without IV contrast. RADIATION DOSE REDUCTION: This exam was performed according to the departmental dose-optimization program which includes automated exposure control, adjustment of the mA and/or kV according to patient size and/or use of iterative reconstruction technique. COMPARISON:  Multiple priors including most recent CT August 22, 2022 FINDINGS: CT CHEST FINDINGS Cardiovascular: Right chest Port-A-Cath with tip at the superior cavoatrial junction. Normal size heart. Three-vessel coronary artery calcifications. Mediastinum/Nodes: No supraclavicular adenopathy. Similar irregular lobularity along the left side of the thyroid gland. No pathologically enlarged mediastinal, hilar or axillary lymph nodes. The esophagus is grossly unremarkable. Lungs/Pleura: Left lower lobe pulmonary nodule measuring 3 mm on image 91/3 is stable over multiple prior examinations dating back to at least 01/22/2022 favoring a benign etiology. No new suspicious pulmonary nodules or masses. Scattered atelectasis/scarring. Minimal biapical pleuroparenchymal scarring. Musculoskeletal: No aggressive lytic or blastic lesion of bone. Multilevel degenerative change of the spine. Similar subcutaneous nodules about the posterior right and midline chest wall measuring up to 11 mm favoring sebaceous cysts. CT ABDOMEN PELVIS FINDINGS Hepatobiliary: Hypodense segment 4 a hepatic lesion now measures 15 mm on image 55/2 previously 3.1 cm favored to reflect an involuting cyst. Gallbladder is unremarkable. No biliary ductal dilation. Pancreas: No pancreatic ductal dilation or evidence of acute inflammation. Spleen: No splenomegaly. Adrenals/Urinary Tract: Left adrenal nodule measures 2.7 x 2.3  cm on image 56/2 previously 2.8 x 2.4 cm. Right adrenal gland appears normal. No hydronephrosis. Urinary bladder is unremarkable for degree of distension. Stomach/Bowel: Stomach is unremarkable for degree of distension. No pathologic dilation of small or large bowel. Colonic diverticulosis without findings of acute diverticulitis. No evidence of acute bowel inflammation. Vascular/Lymphatic: Aortic atherosclerosis. Normal caliber abdominal aorta. Smooth IVC contours. No pathologically enlarged abdominal or pelvic lymph nodes. Reproductive: No discrete uterine mass identified. No suspicious adnexal lesion. Other: No significant abdominopelvic free fluid. No discrete peritoneal or omental nodularity. Laxity of the pelvic floor. Musculoskeletal: No aggressive lytic or blastic lesion of bone. Multilevel degenerative change of the spine. IMPRESSION: 1. Stable indeterminate left adrenal nodule. 2. Otherwise, no noncontrast enhanced CT evidence of metastatic disease. 3. Colonic diverticulosis without findings of acute diverticulitis. 4.  Aortic Atherosclerosis (ICD10-I70.0). Electronically Signed: By: Maudry Mayhew M.D. On: 12/22/2022 14:24

## 2023-01-23 NOTE — Assessment & Plan Note (Signed)
lipid poor hyperfunctioning adenoma vs mets.  FDG avid on PET scan.  Difficult biopsy due to patient's body habitus and deep position of the mass. Resolved on PET scan, suggesting that this lesion might be a metastatic lesion. CT showed stable lesion  Continue monitor

## 2023-01-23 NOTE — Assessment & Plan Note (Signed)
Stable hemoglobin 

## 2023-01-23 NOTE — Progress Notes (Signed)
Pt here for follow up. Recently finished antibiotics for UTI.  Requesting refill on gabapentin.

## 2023-01-23 NOTE — Assessment & Plan Note (Signed)
Trial of gabapentin 100mg  BID.

## 2023-01-23 NOTE — Assessment & Plan Note (Signed)
Treatment plan as listed above. 

## 2023-01-23 NOTE — Patient Instructions (Signed)
[facility] - A DEPT OF [facility]  Discharge Instructions: Thank you for choosing [facility] to provide your oncology and hematology care.  If you have a lab appointment with the Cancer Center, please go directly to the Cancer Center and check in at the registration area.  Wear comfortable clothing and clothing appropriate for easy access to any Portacath or PICC line.   We strive to give you quality time with your provider. You may need to reschedule your appointment if you arrive late (15 or more minutes).  Arriving late affects you and other patients whose appointments are after yours.  Also, if you miss three or more appointments without notifying the office, you may be dismissed from the clinic at the provider's discretion.      For prescription refill requests, have your pharmacy contact our office and allow 72 hours for refills to be completed.    Today you received the following chemotherapy and/or immunotherapy agents KEYTRUDA      To help prevent nausea and vomiting after your treatment, we encourage you to take your nausea medication as directed.  BELOW ARE SYMPTOMS THAT SHOULD BE REPORTED IMMEDIATELY: *FEVER GREATER THAN 100.4 F (38 C) OR HIGHER *CHILLS OR SWEATING *NAUSEA AND VOMITING THAT IS NOT CONTROLLED WITH YOUR NAUSEA MEDICATION *UNUSUAL SHORTNESS OF BREATH *UNUSUAL BRUISING OR BLEEDING *URINARY PROBLEMS (pain or burning when urinating, or frequent urination) *BOWEL PROBLEMS (unusual diarrhea, constipation, pain near the anus) TENDERNESS IN MOUTH AND THROAT WITH OR WITHOUT PRESENCE OF ULCERS (sore throat, sores in mouth, or a toothache) UNUSUAL RASH, SWELLING OR PAIN  UNUSUAL VAGINAL DISCHARGE OR ITCHING   Items with * indicate a potential emergency and should be followed up as soon as possible or go to the Emergency Department if any problems should occur.  Please show the CHEMOTHERAPY ALERT CARD or IMMUNOTHERAPY  ALERT CARD at check-in to the Emergency Department and triage nurse.  Should you have questions after your visit or need to cancel or reschedule your appointment, please contact [facility] - A DEPT OF [facility]igh[facility]ideg[facility]  [phone] and follow the prompts.  Office hours are 8:00 a.m. to 4:30 p.m. Monday - Friday. Please note that voicemails left after 4:00 p.m. may not be returned until the following business day.  We are closed weekends and major holidays. You have access to a nurse at all times for urgent questions. Please call the main number to the clinic [phone] and follow the prompts.  For any non-urgent questions, you may also contact your provider using MyChart. We now offer e-Visits for anyone 9 and older to request care online for non-urgent symptoms. For details visit [email or web address].   Also download the MyChart app! Go to the app store, search "MyChart", open the app, select [place], and log in with your MyChart username and password.  Pembrolizumab Injection What is this medication? PEMBROLIZUMAB (PEM broe LIZ ue mab) treats some types of cancer. It works by helping your immune system slow or stop the spread of cancer cells. It is a monoclonal antibody. This medicine may be used for other purposes; ask your health care provider or pharmacist if you have questions. COMMON BRAND NAME(S): Keytruda What should I tell my care team before I take this medication? They need to know if you have any of these conditions: Allogeneic stem cell transplant (uses someone else's stem cells) Autoimmune diseases, such as Crohn disease, ulcerative colitis, lupus History  of chest radiation Nervous system problems, such as Guillain-Barre syndrome, myasthenia gravis Organ transplant An unusual or allergic reaction to pembrolizumab, other medications, foods, dyes, or preservatives Pregnant or trying to get pregnant Breast-feeding How should I use this  medication? This medication is injected into a vein. It is given by your care team in a hospital or clinic setting. A special MedGuide will be given to you before each treatment. Be sure to read this information carefully each time. Talk to your care team about the use of this medication in children. While it may be prescribed for children as young as 6 months for selected conditions, precautions do apply. Overdosage: If you think you have taken too much of this medicine contact a poison control center or emergency room at once. NOTE: This medicine is only for you. Do not share this medicine with others. What if I miss a dose? Keep appointments for follow-up doses. It is important not to miss your dose. Call your care team if you are unable to keep an appointment. What may interact with this medication? Interactions have not been studied. This list may not describe all possible interactions. Give your health care provider a list of all the medicines, herbs, non-prescription drugs, or dietary supplements you use. Also tell them if you smoke, drink alcohol, or use illegal drugs. Some items may interact with your medicine. What should I watch for while using this medication? Your condition will be monitored carefully while you are receiving this medication. You may need blood work while taking this medication. This medication may cause serious skin reactions. They can happen weeks to months after starting the medication. Contact your care team right away if you notice fevers or flu-like symptoms with a rash. The rash may be red or purple and then turn into blisters or peeling of the skin. You may also notice a red rash with swelling of the face, lips, or lymph nodes in your neck or under your arms. Tell your care team right away if you have any change in your eyesight. Talk to your care team if you may be pregnant. Serious birth defects can occur if you take this medication during pregnancy and for 4  months after the last dose. You will need a negative pregnancy test before starting this medication. Contraception is recommended while taking this medication and for 4 months after the last dose. Your care team can help you find the option that works for you. Do not breastfeed while taking this medication and for 4 months after the last dose. What side effects may I notice from receiving this medication? Side effects that you should report to your care team as soon as possible: Allergic reactions--skin rash, itching, hives, swelling of the face, lips, tongue, or throat Dry cough, shortness of breath or trouble breathing Eye pain, redness, irritation, or discharge with blurry or decreased vision Heart muscle inflammation--unusual weakness or fatigue, shortness of breath, chest pain, fast or irregular heartbeat, dizziness, swelling of the ankles, feet, or hands Hormone gland problems--headache, sensitivity to light, unusual weakness or fatigue, dizziness, fast or irregular heartbeat, increased sensitivity to cold or heat, excessive sweating, constipation, hair loss, increased thirst or amount of urine, tremors or shaking, irritability Infusion reactions--chest pain, shortness of breath or trouble breathing, feeling faint or lightheaded Kidney injury (glomerulonephritis)--decrease in the amount of urine, red or dark brown urine, foamy or bubbly urine, swelling of the ankles, hands, or feet Liver injury--right upper belly pain, loss of appetite, nausea, light-colored  stool, dark yellow or brown urine, yellowing skin or eyes, unusual weakness or fatigue Pain, tingling, or numbness in the hands or feet, muscle weakness, change in vision, confusion or trouble speaking, loss of balance or coordination, trouble walking, seizures Rash, fever, and swollen lymph nodes Redness, blistering, peeling, or loosening of the skin, including inside the mouth Sudden or severe stomach pain, bloody diarrhea, fever, nausea,  vomiting Side effects that usually do not require medical attention (report to your care team if they continue or are bothersome): Bone, joint, or muscle pain Diarrhea Fatigue Loss of appetite Nausea Skin rash This list may not describe all possible side effects. Call your doctor for medical advice about side effects. You may report side effects to FDA at 1-800-FDA-1088. Where should I keep my medication? This medication is given in a hospital or clinic. It will not be stored at home. NOTE: This sheet is a summary. It may not cover all possible information. If you have questions about this medicine, talk to your doctor, pharmacist, or health care provider.  2024 Elsevier/Gold Standard (2021-07-16 00:00:00)

## 2023-01-26 ENCOUNTER — Other Ambulatory Visit: Payer: Self-pay

## 2023-01-26 ENCOUNTER — Telehealth: Payer: Self-pay

## 2023-01-26 DIAGNOSIS — E041 Nontoxic single thyroid nodule: Secondary | ICD-10-CM

## 2023-01-26 NOTE — Telephone Encounter (Signed)
Valerie Case looks like I ordered US Neck soft tissue instead of US Thyroid which is what is needed. I put new order in.

## 2023-01-26 NOTE — Telephone Encounter (Signed)
-----   Message from Rickard Patience sent at 01/23/2023  4:48 PM EST ----- Looks like US neck soft tissue was ordered. Please change to US thyroid thanks.   Janyth Contes

## 2023-02-02 ENCOUNTER — Ambulatory Visit
Admission: RE | Admit: 2023-02-02 | Discharge: 2023-02-02 | Disposition: A | Discharge status: Home / Self Care | Payer: Medicare PPO | Source: Ambulatory Visit | Attending: Oncology | Admitting: Oncology

## 2023-02-02 DIAGNOSIS — C541 Malignant neoplasm of endometrium: Secondary | ICD-10-CM | POA: Insufficient documentation

## 2023-02-09 ENCOUNTER — Encounter: Payer: Self-pay | Admitting: Gastroenterology

## 2023-02-16 ENCOUNTER — Telehealth: Payer: Self-pay

## 2023-02-16 ENCOUNTER — Inpatient Hospital Stay: Payer: Medicare PPO

## 2023-02-16 ENCOUNTER — Inpatient Hospital Stay (HOSPITAL_BASED_OUTPATIENT_CLINIC_OR_DEPARTMENT_OTHER): Payer: Medicare PPO | Admitting: Oncology

## 2023-02-16 ENCOUNTER — Encounter: Payer: Self-pay | Admitting: Oncology

## 2023-02-16 ENCOUNTER — Inpatient Hospital Stay: Payer: Medicare PPO | Attending: Obstetrics and Gynecology

## 2023-02-16 VITALS — BP 114/64 | HR 104 | Temp 98.4°F | Resp 18 | Wt 287.0 lb

## 2023-02-16 DIAGNOSIS — R599 Enlarged lymph nodes, unspecified: Secondary | ICD-10-CM | POA: Diagnosis not present

## 2023-02-16 DIAGNOSIS — D631 Anemia in chronic kidney disease: Secondary | ICD-10-CM | POA: Insufficient documentation

## 2023-02-16 DIAGNOSIS — N189 Chronic kidney disease, unspecified: Secondary | ICD-10-CM

## 2023-02-16 DIAGNOSIS — Z9089 Acquired absence of other organs: Secondary | ICD-10-CM | POA: Insufficient documentation

## 2023-02-16 DIAGNOSIS — E041 Nontoxic single thyroid nodule: Secondary | ICD-10-CM | POA: Diagnosis not present

## 2023-02-16 DIAGNOSIS — D508 Other iron deficiency anemias: Secondary | ICD-10-CM | POA: Insufficient documentation

## 2023-02-16 DIAGNOSIS — K449 Diaphragmatic hernia without obstruction or gangrene: Secondary | ICD-10-CM | POA: Insufficient documentation

## 2023-02-16 DIAGNOSIS — Z86018 Personal history of other benign neoplasm: Secondary | ICD-10-CM | POA: Insufficient documentation

## 2023-02-16 DIAGNOSIS — E278 Other specified disorders of adrenal gland: Secondary | ICD-10-CM | POA: Diagnosis not present

## 2023-02-16 DIAGNOSIS — Z8719 Personal history of other diseases of the digestive system: Secondary | ICD-10-CM | POA: Insufficient documentation

## 2023-02-16 DIAGNOSIS — K5730 Diverticulosis of large intestine without perforation or abscess without bleeding: Secondary | ICD-10-CM | POA: Diagnosis not present

## 2023-02-16 DIAGNOSIS — Z79899 Other long term (current) drug therapy: Secondary | ICD-10-CM | POA: Insufficient documentation

## 2023-02-16 DIAGNOSIS — L723 Sebaceous cyst: Secondary | ICD-10-CM | POA: Diagnosis not present

## 2023-02-16 DIAGNOSIS — E1122 Type 2 diabetes mellitus with diabetic chronic kidney disease: Secondary | ICD-10-CM | POA: Insufficient documentation

## 2023-02-16 DIAGNOSIS — Z9049 Acquired absence of other specified parts of digestive tract: Secondary | ICD-10-CM | POA: Insufficient documentation

## 2023-02-16 DIAGNOSIS — Z5112 Encounter for antineoplastic immunotherapy: Secondary | ICD-10-CM | POA: Insufficient documentation

## 2023-02-16 DIAGNOSIS — R160 Hepatomegaly, not elsewhere classified: Secondary | ICD-10-CM | POA: Insufficient documentation

## 2023-02-16 DIAGNOSIS — K760 Fatty (change of) liver, not elsewhere classified: Secondary | ICD-10-CM | POA: Insufficient documentation

## 2023-02-16 DIAGNOSIS — C541 Malignant neoplasm of endometrium: Secondary | ICD-10-CM

## 2023-02-16 DIAGNOSIS — Z886 Allergy status to analgesic agent status: Secondary | ICD-10-CM | POA: Insufficient documentation

## 2023-02-16 DIAGNOSIS — R21 Rash and other nonspecific skin eruption: Secondary | ICD-10-CM | POA: Diagnosis not present

## 2023-02-16 DIAGNOSIS — Z515 Encounter for palliative care: Secondary | ICD-10-CM | POA: Diagnosis not present

## 2023-02-16 DIAGNOSIS — E042 Nontoxic multinodular goiter: Secondary | ICD-10-CM | POA: Insufficient documentation

## 2023-02-16 DIAGNOSIS — I129 Hypertensive chronic kidney disease with stage 1 through stage 4 chronic kidney disease, or unspecified chronic kidney disease: Secondary | ICD-10-CM | POA: Insufficient documentation

## 2023-02-16 DIAGNOSIS — M4316 Spondylolisthesis, lumbar region: Secondary | ICD-10-CM | POA: Diagnosis not present

## 2023-02-16 DIAGNOSIS — I700 Atherosclerosis of aorta: Secondary | ICD-10-CM | POA: Diagnosis not present

## 2023-02-16 DIAGNOSIS — I872 Venous insufficiency (chronic) (peripheral): Secondary | ICD-10-CM | POA: Diagnosis not present

## 2023-02-16 DIAGNOSIS — Z881 Allergy status to other antibiotic agents status: Secondary | ICD-10-CM | POA: Insufficient documentation

## 2023-02-16 DIAGNOSIS — Z801 Family history of malignant neoplasm of trachea, bronchus and lung: Secondary | ICD-10-CM | POA: Insufficient documentation

## 2023-02-16 DIAGNOSIS — Z923 Personal history of irradiation: Secondary | ICD-10-CM | POA: Insufficient documentation

## 2023-02-16 DIAGNOSIS — Z803 Family history of malignant neoplasm of breast: Secondary | ICD-10-CM | POA: Insufficient documentation

## 2023-02-16 DIAGNOSIS — E279 Disorder of adrenal gland, unspecified: Secondary | ICD-10-CM | POA: Insufficient documentation

## 2023-02-16 DIAGNOSIS — I2510 Atherosclerotic heart disease of native coronary artery without angina pectoris: Secondary | ICD-10-CM | POA: Diagnosis not present

## 2023-02-16 LAB — COMPREHENSIVE METABOLIC PANEL
ALT: 20 U/L (ref 0–44)
AST: 21 U/L (ref 15–41)
Albumin: 3.7 g/dL (ref 3.5–5.0)
Alkaline Phosphatase: 47 U/L (ref 38–126)
Anion gap: 12 (ref 5–15)
BUN: 37 mg/dL — ABNORMAL HIGH (ref 8–23)
CO2: 20 mmol/L — ABNORMAL LOW (ref 22–32)
Calcium: 9.1 mg/dL (ref 8.9–10.3)
Chloride: 109 mmol/L (ref 98–111)
Creatinine, Ser: 1.07 mg/dL — ABNORMAL HIGH (ref 0.44–1.00)
GFR, Estimated: 56 mL/min — ABNORMAL LOW (ref >60)
Glucose, Bld: 200 mg/dL — ABNORMAL HIGH (ref 70–99)
Potassium: 4 mmol/L (ref 3.5–5.1)
Sodium: 141 mmol/L (ref 135–145)
Total Bilirubin: 0.3 mg/dL (ref <1.2)
Total Protein: 7 g/dL (ref 6.5–8.1)

## 2023-02-16 LAB — CBC WITH DIFFERENTIAL/PLATELET
Abs Immature Granulocytes: 0.08 10*3/uL — ABNORMAL HIGH (ref 0.00–0.07)
Basophils Absolute: 0 10*3/uL (ref 0.0–0.1)
Basophils Relative: 1 %
Eosinophils Absolute: 0.2 10*3/uL (ref 0.0–0.5)
Eosinophils Relative: 4 %
HCT: 35.6 % — ABNORMAL LOW (ref 36.0–46.0)
Hemoglobin: 11 g/dL — ABNORMAL LOW (ref 12.0–15.0)
Immature Granulocytes: 1 %
Lymphocytes Relative: 20 %
Lymphs Abs: 1.2 10*3/uL (ref 0.7–4.0)
MCH: 30.4 pg (ref 26.0–34.0)
MCHC: 30.9 g/dL (ref 30.0–36.0)
MCV: 98.3 fL (ref 80.0–100.0)
Monocytes Absolute: 0.6 10*3/uL (ref 0.1–1.0)
Monocytes Relative: 9 %
Neutro Abs: 4 10*3/uL (ref 1.7–7.7)
Neutrophils Relative %: 65 %
Platelets: 271 10*3/uL (ref 150–400)
RBC: 3.62 MIL/uL — ABNORMAL LOW (ref 3.87–5.11)
RDW: 13.8 % (ref 11.5–15.5)
WBC: 6.2 10*3/uL (ref 4.0–10.5)
nRBC: 0 % (ref 0.0–0.2)

## 2023-02-16 LAB — TSH: TSH: 1.162 u[IU]/mL (ref 0.350–4.500)

## 2023-02-16 MED ORDER — HEPARIN SOD (PORK) LOCK FLUSH 100 UNIT/ML IV SOLN
500.0000 [IU] | Freq: Once | INTRAVENOUS | Status: AC | PRN
  Administered 2023-02-16: 500 [IU]
  Filled 2023-02-16: qty 5

## 2023-02-16 MED ORDER — SODIUM CHLORIDE 0.9 % IV SOLN
200.0000 mg | Freq: Once | INTRAVENOUS | Status: AC
  Administered 2023-02-16: 200 mg via INTRAVENOUS
  Filled 2023-02-16: qty 200

## 2023-02-16 MED ORDER — SODIUM CHLORIDE 0.9 % IV SOLN
Freq: Once | INTRAVENOUS | Status: AC
Start: 2023-02-16 — End: 2023-02-16
  Filled 2023-02-16: qty 250

## 2023-02-16 NOTE — Patient Instructions (Signed)
[facility]  Discharge Instructions: Thank you for choosing [facility] to provide your oncology and hematology care.  If you have a lab appointment with the Cancer Center, please go directly to the Cancer Center and check in at the registration area.  Wear comfortable clothing and clothing appropriate for easy access to any Portacath or PICC line.   We strive to give you quality time with your provider. You may need to reschedule your appointment if you arrive late (15 or more minutes).  Arriving late affects you and other patients whose appointments are after yours.  Also, if you miss three or more appointments without notifying the office, you may be dismissed from the clinic at the provider's discretion.      For prescription refill requests, have your pharmacy contact our office and allow 72 hours for refills to be completed.    Today you received the following chemotherapy and/or immunotherapy agents Rande Lawman      To help prevent nausea and vomiting after your treatment, we encourage you to take your nausea medication as directed.  BELOW ARE SYMPTOMS THAT SHOULD BE REPORTED IMMEDIATELY: *FEVER GREATER THAN 100.4 F (38 C) OR HIGHER *CHILLS OR SWEATING *NAUSEA AND VOMITING THAT IS NOT CONTROLLED WITH YOUR NAUSEA MEDICATION *UNUSUAL SHORTNESS OF BREATH *UNUSUAL BRUISING OR BLEEDING *URINARY PROBLEMS (pain or burning when urinating, or frequent urination) *BOWEL PROBLEMS (unusual diarrhea, constipation, pain near the anus) TENDERNESS IN MOUTH AND THROAT WITH OR WITHOUT PRESENCE OF ULCERS (sore throat, sores in mouth, or a toothache) UNUSUAL RASH, SWELLING OR PAIN  UNUSUAL VAGINAL DISCHARGE OR ITCHING   Items with * indicate a potential emergency and should be followed up as soon as possible or go to the Emergency Department if any problems should occur.  Please show the CHEMOTHERAPY ALERT CARD or IMMUNOTHERAPY  ALERT CARD at check-in to the Emergency Department and triage nurse.  Should you have questions after your visit or need to cancel or reschedule your appointment, please contact [facility] - A DEPT OF [facility]  [phone] and follow the prompts.  Office hours are 8:00 a.m. to 4:30 p.m. Monday - Friday. Please note that voicemails left after 4:00 p.m. may not be returned until the following business day.  We are closed weekends and major holidays. You have access to a nurse at all times for urgent questions. Please call the main number to the clinic [phone] and follow the prompts.  For any non-urgent questions, you may also contact your provider using MyChart. We now offer e-Visits for anyone 25 and older to request care online for non-urgent symptoms. For details visit [email or web address].   Also download the MyChart app! Go to the app store, search "MyChart", open the app, select [place], and log in with your MyChart username and password.

## 2023-02-16 NOTE — Telephone Encounter (Signed)
Pt scheduled for thyroid biopsy on 12/5 @ 1p, arrive 12:30p. Pt aware of appt details.

## 2023-02-16 NOTE — Assessment & Plan Note (Signed)
Treatment plan as listed above. 

## 2023-02-16 NOTE — Assessment & Plan Note (Signed)
Stable hemoglobin 

## 2023-02-16 NOTE — Assessment & Plan Note (Signed)
Obtain Thyroid nodule biopsy.  Recommend patient to follow up with endocrinology for management.

## 2023-02-16 NOTE — Progress Notes (Signed)
Hematology/Oncology Progress note [phone] [phone]      CHIEF COMPLAINTS/REASON FOR VISIT:  Follow up for endometrial cancer  ASSESSMENT & PLAN:   Cancer Staging  Endometrial adenocarcinoma [facility]) Staging form: Corpus Uteri - Carcinoma and Carcinosarcoma, AJCC 8th Edition - Clinical stage from 04/10/2021: FIGO Stage III, calculated as Stage Unknown (cT3, cNX, cM0) - Signed by Rickard Patience, MD on 08/24/2021   Endometrial adenocarcinoma (HCC) FIGO grade 3 poorly differentiated endometrial adenocarcinoma. s/p carboplatin and Taxol for 5 cycles, on Keytruda maintenance.  Labs are reviewed and discussed with patient. CT shows stable disease/NED Proceed with Keytruda    Adrenal mass (HCC) lipid poor hyperfunctioning adenoma vs mets.  FDG avid on PET scan.  Difficult biopsy due to patient's body habitus and deep position of the mass. Resolved on PET scan, suggesting that this lesion might be a metastatic lesion. CT showed stable lesion  Continue monitor   Anemia in chronic kidney disease (CKD) Stable hemoglobin  Encounter for antineoplastic immunotherapy Treatment plan as listed above.   Thyroid nodule Obtain Thyroid nodule biopsy.  Recommend patient to follow up with endocrinology for management.    Orders Placed This Encounter  Procedures   [place] FNA BX THYROID 1ST LESION AFIRMA    Standing Status:   Future    Standing Expiration Date:   02/16/2024    Order Specific Question:   Reason for Exam (SYMPTOM  OR DIAGNOSIS REQUIRED)    Answer:   thyroid nodule    Order Specific Question:   Preferred location?    Answer:   [facility]    Return of visit:  3 weeks Lab MD Greggory Brandy, MD, [name] [facility] Hematology Oncology 02/16/2023     HISTORY OF PRESENTING ILLNESS:   Valerie Case is a  72 y.o.  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 Oncology History  Endometrial  adenocarcinoma (HCC)  12/12/2020 Imaging   12/12/2020, CT abdomen pelvis with contrast showed no radiographic evidence of urinary tract neoplasm, calculi, hydronephrosis.  2.6 cm nonspecific left adrenal mass. 02/12/2021 CT hematuria work-up showed small uterine fibroids, no findings to explain hematuria.Hepatomegaly, diverticulosis without evidence of diverticulitis, Coronary artery disease   04/01/2021 -  Hospital Admission   04/01/2021 - 04/03/2021, patient was hospitalized due to symptomatic anemia, hemoglobin 6.9, heavy postmenopausal bleeding with passing large clots.  She also presented with increased creatinine level to 2.58 compared to her baseline level of 0.9 in November 2022.  Patient received IV iron infusion and 2 units of PRBC during the hospital stay..  04/02/2021, iron panel showed iron saturation 37, ferritin 37, TIBC 333-the studies were done after patient received blood transfusion.  At discharge, hemoglobin was 7.7.   04/01/2021 Initial Diagnosis   04/01/2020, endometrial biopsy showed poorly differentiated endometrial adenocarcinoma. Omniseq NGS showed TMB 48.4 mt/mb [high], MSI High, PD-L1-TPS <1%, PIK3CA H1047Q, Negative for BRAF, HER2, NTRK1 RET.     04/10/2021 Cancer Staging   Staging form: Corpus Uteri - Carcinoma and Carcinosarcoma, AJCC 8th Edition - Clinical stage from 04/10/2021: FIGO Stage III, calculated as Stage Unknown (cT3, cNX, cM0) - Signed by Rickard Patience, MD on 08/24/2021 Stage prefix: Initial diagnosis   04/15/2021 Imaging   PET showed 1. Large hypermetabolic endometrial mass consistent with known endometrial cancer. Suspect direct invasion/involvement of the right adnexa. Right pelvic sidewall hypermetabolic adenopathy.2. Enlarged and hypermetabolic left adrenal gland lesion could reflect a lipid poor hyperfunctioning adenoma but  metastasis is also possible. 3. No findings for metastatic disease involving the chest or bonystructures.     04/24/2021 - 05/29/2021 Radiation  Therapy   status post radiation to pelvis   08/05/2021 Imaging   MRI abdomen w wo contrast 1. Stable solid enhancing 2.7 cm left adrenal lesion, which was hypermetabolic on prior PET/CTs but is unchanged in size dating back to December 12, 2020. 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 However, unfortunately metastatic disease can not be entirely excluded and remains a pertinent differential consideration. Comparison with more remote prior imaging would be the most valuable tool in the assessment of this lesion, as demonstrating long-term stability would indicate this to be a benign lesion. However, if no prior imaging can be made available, would consider follow-up adrenal protocol CT with and without intravenous contrast material in 3 months as this would allow for further assessment of stability as well as enhancement and washout characteristics of the lesion potentially allowing for better characterization versus direct tissue sampling.2. Hepatomegaly and hepatic steatosis.3. Colonic diverticulosis without findings of acute diverticulitis    08/16/2021 - 09/27/2021 Chemotherapy   UTERINE Carboplatin AUC 5 / Paclitaxel q21d x 3      10/03/2021 Imaging   PET 1. No residual hypermetabolism in the uterus suggesting an excellent response to treatment. No findings for metastatic disease. 2. Resolution of hypermetabolism in the left adrenal gland lesion suggesting this was metastatic disease. 3. Diffuse marrow hypermetabolism likely due to rebound from chemotherapy or marrow stimulating drugs   10/18/2021 - 11/08/2021 Chemotherapy   AUC 5 / Paclitaxel/ Keytruda Q21d  x 2 cycles   11/29/2021 -  Chemotherapy   Patient is on Treatment Plan : UTERINE Pembrolizumab (200) q21d     04/30/2022 Imaging   CT chest abdomen pelvis w contrast   1. Similar to minimal decrease in size of a left adrenal nodule which was felt suspicious for metastatic disease on 10/03/2021 PET. 2. No evidence of new or progressive disease. 3. Coronary artery atherosclerosis. Aortic Atherosclerosis (ICD10-I70.0). 4. Incidental findings, including: Hepatomegaly. Possible constipation.   08/28/2022 Imaging   CT chest abdomen pelvis w contrast showed 1. Similar appearance of indeterminate left adrenal nodule. 2. Otherwise, no evidence of metastatic disease. 3. Hepatomegaly 4.  Possible constipation. 5. Coronary artery atherosclerosis. Aortic Atherosclerosis (ICD10-I70.0).   12/22/2022 Imaging   CT chest abdomen pelvis w contrast showed 1. Stable indeterminate left adrenal nodule. 2. Otherwise, no noncontrast enhanced CT evidence of metastatic disease. 3. Colonic diverticulosis without findings of acute diverticulitis. 4.  Aortic Atherosclerosis (ICD10-I70.0).         INTERVAL HISTORY Valerie Case is a 72 y.o. female who has above history reviewed by me today presents for follow up visit for anemia and endometrial cancer Overall she tolerates treatment Patient denies nausea vomiting diarrhea.   Review of Systems  Constitutional:  Positive for fatigue. Negative for chills and fever.  HENT:   Negative for hearing loss and voice change.   Eyes:  Negative for eye problems.  Respiratory:  Negative for chest tightness and cough.   Cardiovascular:  Negative for chest pain.  Gastrointestinal:  Negative for abdominal distention, abdominal pain and blood in stool.  Endocrine: Negative for hot flashes.  Genitourinary:  Negative for difficulty urinating, frequency and vaginal bleeding.   Musculoskeletal:  Positive for arthralgias.  Skin:  Negative  for itching and rash.  Neurological:  Negative for extremity weakness and headaches.  Hematological:  Negative for adenopathy.  Psychiatric/Behavioral:  Negative for confusion.     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   Mixed hyperlipidemia    Peripheral polyneuropathy    Sleep apnea     SURGICAL HISTORY: Past Surgical History:  Procedure Laterality Date   APPENDECTOMY     CESAREAN SECTION  01/23/1979   COLONOSCOPY     ESOPHAGOGASTRODUODENOSCOPY     IR IMAGING GUIDED PORT INSERTION  08/09/2021   TONSILLECTOMY  03/17/1956    SOCIAL HISTORY: Social History   Socioeconomic History   Marital status: Divorced    Spouse name: Not on file   Number of children: Not on file   Years of education: Not on file   Highest education level: Not on file  Occupational History   Not on file  Tobacco Use   Smoking status: Never   Smokeless tobacco: Never  Vaping Use   Vaping status: Never Used  Substance and Sexual Activity   Alcohol use: Not Currently   Drug use: Never   Sexual activity: Not Currently  Other Topics Concern   Not on file  Social History Narrative      Social Determinants of Health   Financial Resource Strain: Low Risk  (01/15/2023)   Received from [facility]   Overall Financial Resource Strain (CARDIA)    Difficulty of Paying Living Expenses: Not hard at all  Food Insecurity: No Food Insecurity (01/15/2023)   Received from [facility]   Hunger Vital Sign    Worried About Running Out of Food in the Last Year: Never true    Ran Out of Food in the Last Year: Never true  Transportation Needs: No Transportation Needs (01/15/2023)   Received from [facility] - Transportation    In the past 12 months, has lack of transportation kept you from medical appointments or from getting medications?: No    Lack of Transportation (Non-Medical): No  Physical Activity: Inactive (09/27/2021)   Exercise Vital Sign    Days of Exercise per Week: 0 days    Minutes of Exercise per Session: 0 min  Stress: Stress Concern Present (09/27/2021)   [employer] of Occupational Health - Occupational Stress Questionnaire    Feeling of Stress : Rather much  Social Connections: Moderately Integrated (09/27/2021)   Social Connection and Isolation Panel [NHANES]    Frequency of Communication with Friends and Family: Three times a week    Frequency of Social Gatherings with Friends and Family: Three times a week    Attends Religious Services: 1 to 4 times per year    Active Member of Clubs or Organizations: No    Attends Banker Meetings: 1 to 4 times per year    Marital Status: Divorced  Catering manager Violence:  Not At Risk (09/27/2021)   Humiliation, Afraid, Rape, and Kick questionnaire    Fear of Current or Ex-Partner: No    Emotionally Abused: No    Physically Abused: No    Sexually Abused: No    FAMILY HISTORY: Family History  Problem Relation Age of Onset   Breast cancer Mother 69   Cancer Mother    Cancer Father    Lung cancer Father    Breast cancer Cousin        dx 39s maternal    ALLERGIES:  is allergic to doxycycline and aspirin.  MEDICATIONS:  Current Outpatient Medications  Medication Sig Dispense Refill   acetaminophen (TYLENOL) 650 MG CR tablet Take 1,300 mg by mouth every 8 (eight) hours as needed for pain.     albuterol (VENTOLIN HFA) 108 (90 Base) MCG/ACT inhaler Inhale 2 puffs into the lungs every 6 (six) hours as needed for wheezing or shortness of breath.     azelastine (ASTELIN) 0.1 % nasal spray Place 2 sprays into both nostrils daily. Use in each nostril as directed 30 mL 0    Cranberry-Vitamin C-Probiotic (AZO CRANBERRY PO) Take by mouth.     docusate sodium (COLACE) 100 MG capsule Take 1 capsule (100 mg total) by mouth 2 (two) times daily. 60 capsule 1   empagliflozin (JARDIANCE) 25 MG TABS tablet Take 25 mg by mouth daily.     ferrous sulfate 325 (65 FE) MG EC tablet Take 325 mg by mouth daily with breakfast.     FLUoxetine (PROZAC) 10 MG capsule Take 10 mg by mouth daily.     fluticasone (FLONASE) 50 MCG/ACT nasal spray Place 1 spray into both nostrils daily as needed for allergies or rhinitis.     furosemide (LASIX) 20 MG tablet Take 20 mg by mouth daily.     gabapentin (NEURONTIN) 100 MG capsule Take 1 capsule (100 mg total) by mouth daily. 90 capsule 1   hydrochlorothiazide (HYDRODIURIL) 12.5 MG tablet Take 12.5 mg by mouth daily.     hydrocortisone 2.5 % cream Apply topically 3 (three) times daily. 30 g 2   ibuprofen (ADVIL) 200 MG tablet Take 400 mg by mouth every 8 (eight) hours as needed for moderate pain.     Insulin Asp Prot & Asp FlexPen (NOVOLOG 70/30 MIX) (70-30) 100 UNIT/ML FlexPen Inject into the skin.     lidocaine-prilocaine (EMLA) cream Apply to affected area once 30 g 3   lisinopril (ZESTRIL) 40 MG tablet Take 40 mg by mouth at bedtime.     loratadine (CLARITIN) 10 MG tablet Take by mouth.     LORazepam (ATIVAN) 0.5 MG tablet Take 1 tablet (0.5 mg total) by mouth every 8 (eight) hours as needed for anxiety (nausea vomiting). 30 tablet 0   metFORMIN (GLUCOPHAGE-XR) 500 MG 24 hr tablet Take 1,000 mg by mouth at bedtime.     metoprolol succinate (TOPROL-XL) 25 MG 24 hr tablet Take 25 mg by mouth daily.     mupirocin ointment (BACTROBAN) 2 % Apply 1 Application topically 3 (three) times daily. 22 g 0   NOVOLIN 70/30 FLEXPEN (70-30) 100 UNIT/ML KwikPen 50-110 Units See admin instructions. 50 units in the morning, 110 units at bedtime     ondansetron (ZOFRAN) 8 MG tablet Take 1 tablet (8 mg total) by mouth 2 (two) times daily as needed. Start on the  third day after chemotherapy. 30 tablet 1   OZEMPIC, 1 MG/DOSE, 4 MG/3ML SOPN Inject 1 mg into the skin  every Tuesday.     potassium chloride SA (KLOR-CON M) 20 MEQ tablet Take 1 tablet (20 mEq total) by mouth daily. 3 tablet 0   prochlorperazine (COMPAZINE) 10 MG tablet Take 1 tablet (10 mg total) by mouth every 6 (six) hours as needed (Nausea or vomiting). 30 tablet 1   rosuvastatin (CRESTOR) 10 MG tablet Take 10 mg by mouth daily.     traMADol (ULTRAM) 50 MG tablet Take 2 tablets (100 mg total) by mouth every 6 (six) hours as needed. 90 tablet 0   triamcinolone ointment (KENALOG) 0.5 % Apply 1 Application topically 2 (two) times daily. 30 g 0   fluconazole (DIFLUCAN) 150 MG tablet Take 1 tablet (150 mg total) by mouth daily. Take 1 tablet day one and repeat 2 days later. (Patient not taking: Reported on 01/02/2023) 2 tablet 0   No current facility-administered medications for this visit.   Facility-Administered Medications Ordered in Other Visits  Medication Dose Route Frequency Provider Last Rate Last Admin   heparin lock flush 100 UNIT/ML injection              PHYSICAL EXAMINATION: ECOG PERFORMANCE STATUS: 1 - Symptomatic but completely ambulatory  Physical Exam Constitutional:      General: She is not in acute distress.    Appearance: She is obese.  HENT:     Head: Normocephalic and atraumatic.  Eyes:     General: No scleral icterus. Cardiovascular:     Rate and Rhythm: Regular rhythm. Tachycardia present.     Heart sounds: Murmur heard.  Pulmonary:     Effort: Pulmonary effort is normal. No respiratory distress.  Abdominal:     General: Bowel sounds are normal. There is no distension.     Palpations: Abdomen is soft.  Musculoskeletal:        General: No deformity. Normal range of motion.     Cervical back: Normal range of motion.     Right lower leg: Edema present.     Left lower leg: Edema present.     Comments:    Skin:    General: Skin is warm and dry.      Findings: No rash.     Comments: Chronic venous sufficiency skin changes  Neurological:     Mental Status: She is alert and oriented to person, place, and time. Mental status is at baseline.     Cranial Nerves: No cranial nerve deficit.     Coordination: Coordination normal.  Psychiatric:        Mood and Affect: Mood normal.    LABORATORY DATA:  I have reviewed the data as listed    Latest Ref Rng & Units 02/16/2023   10:08 AM 01/23/2023   10:24 AM 01/02/2023    9:55 AM  CBC  WBC 4.0 - 10.5 K/uL 6.2  6.8  6.9   Hemoglobin 12.0 - 15.0 g/dL 40.9  81.1  91.4   Hematocrit 36.0 - 46.0 % 35.6  36.1  34.8   Platelets 150 - 400 K/uL 271  297  275       Latest Ref Rng & Units 02/16/2023   10:08 AM 01/23/2023   10:24 AM 01/02/2023    9:55 AM  CMP  Glucose 70 - 99 mg/dL 782  956  213   BUN 8 - 23 mg/dL 37  37  39   Creatinine 0.44 - 1.00 mg/dL 0.86  5.78  4.69   Sodium 135 - 145 mmol/L 141  137  139  Potassium 3.5 - 5.1 mmol/L 4.0  4.9  4.3   Chloride 98 - 111 mmol/L 109  111  112   CO2 22 - 32 mmol/L 20  19  19    Calcium 8.9 - 10.3 mg/dL 9.1  63.8  9.7   Total Protein 6.5 - 8.1 g/dL 7.0  7.5  7.2   Total Bilirubin <1.2 mg/dL 0.3  0.4  0.3   Alkaline Phos 38 - 126 U/L 47  56  56   AST 15 - 41 U/L 21  22  21    ALT 0 - 44 U/L 20  21  19        RADIOGRAPHIC STUDIES: I have personally reviewed the radiological images as listed and agreed with the findings in the report. US THYROID  Result Date: 02/07/2023 CLINICAL DATA:  Goiter. EXAM: THYROID ULTRASOUND TECHNIQUE: Ultrasound examination of the thyroid gland and adjacent soft tissues was performed. COMPARISON:  None Available. FINDINGS: Parenchymal Echotexture: Moderately heterogenous Isthmus: 0.2 cm Right lobe: 4.4 x 2.5 x 2.2 cm Left lobe: 4.6 x 2.9 x 2.8 cm _________________________________________________________ Estimated total number of nodules >/= 1 cm: 2 Number of spongiform nodules >/=  2 cm not described below (TR1): 0 Number  of mixed cystic and solid nodules >/= 1.5 cm not described below (TR2): 0 _________________________________________________________ Diffusely enlarged, heterogeneous and goitrous thyroid gland. Nodule # 1, # 2 and # 3 are subcentimeter nodules scattered throughout the right gland. No further follow-up is recommended. Nodule # 4: Cluster of smaller isoechoic solid nodules in the right lower gland. Measured independently, no individual nodule measures larger than 1 cm. No further follow-up. Nodule # 5: Isoechoic ill-defined solid nodule in the left lower gland measures approximately 3.2 x 2.5 x 1.8 cm. Findings are consistent with TI-RADS category 3. **Given size (>/= 2.5 cm) and appearance, fine needle aspiration of this mildly suspicious nodule should be considered based on TI-RADS criteria. IMPRESSION: 1. Heterogeneous enlarged goitrous thyroid gland. 2. Nodule # 5 is a 3.2 cm TI-RADS category 3 nodule in the left lower gland meets criteria to consider fine-needle aspiration biopsy. Biopsy is recommended. The above is in keeping with the ACR TI-RADS recommendations - J Am Coll Radiol 2017;14:587-595. Electronically Signed   By: Malachy Moan M.D.   On: 02/07/2023 14:38   MM 3D SCREENING MAMMOGRAM BILATERAL BREAST  Result Date: 12/30/2022 CLINICAL DATA:  Screening. EXAM: DIGITAL SCREENING BILATERAL MAMMOGRAM WITH TOMOSYNTHESIS AND CAD TECHNIQUE: Bilateral screening digital craniocaudal and mediolateral oblique mammograms were obtained. Bilateral screening digital breast tomosynthesis was performed. The images were evaluated with computer-aided detection. COMPARISON:  Previous exam(s). ACR Breast Density Category a: The breasts are almost entirely fatty. FINDINGS: There are no findings suspicious for malignancy. IMPRESSION: No mammographic evidence of malignancy. A result letter of this screening mammogram will be mailed directly to the patient. RECOMMENDATION: Screening mammogram in one year.  (Code:SM-B-01Y) BI-RADS CATEGORY  1: Negative. Electronically Signed   By: Edwin Cap M.D.   On: 12/30/2022 13:06   CT CHEST ABDOMEN PELVIS WO CONTRAST  Addendum Date: 12/22/2022   ADDENDUM REPORT: 12/22/2022 14:29 ADDENDUM: Mentioned in the body of the report but not the impression: There is similar nodular enlargement of the left lobe of the thyroid, suggest further evaluation with thyroid ultrasound if not previously performed. Electronically Signed   By: Maudry Mayhew M.D.   On: 12/22/2022 14:29   Result Date: 12/22/2022 CLINICAL DATA:  History of endometrial adenocarcinoma, follow-up. * Tracking Code: BO * EXAM: CT  CHEST, ABDOMEN AND PELVIS WITHOUT CONTRAST TECHNIQUE: Multidetector CT imaging of the chest, abdomen and pelvis was performed following the standard protocol without IV contrast. RADIATION DOSE REDUCTION: This exam was performed according to the departmental dose-optimization program which includes automated exposure control, adjustment of the mA and/or kV according to patient size and/or use of iterative reconstruction technique. COMPARISON:  Multiple priors including most recent CT August 22, 2022 FINDINGS: CT CHEST FINDINGS Cardiovascular: Right chest Port-A-Cath with tip at the superior cavoatrial junction. Normal size heart. Three-vessel coronary artery calcifications. Mediastinum/Nodes: No supraclavicular adenopathy. Similar irregular lobularity along the left side of the thyroid gland. No pathologically enlarged mediastinal, hilar or axillary lymph nodes. The esophagus is grossly unremarkable. Lungs/Pleura: Left lower lobe pulmonary nodule measuring 3 mm on image 91/3 is stable over multiple prior examinations dating back to at least 01/22/2022 favoring a benign etiology. No new suspicious pulmonary nodules or masses. Scattered atelectasis/scarring. Minimal biapical pleuroparenchymal scarring. Musculoskeletal: No aggressive lytic or blastic lesion of bone. Multilevel degenerative  change of the spine. Similar subcutaneous nodules about the posterior right and midline chest wall measuring up to 11 mm favoring sebaceous cysts. CT ABDOMEN PELVIS FINDINGS Hepatobiliary: Hypodense segment 4 a hepatic lesion now measures 15 mm on image 55/2 previously 3.1 cm favored to reflect an involuting cyst. Gallbladder is unremarkable. No biliary ductal dilation. Pancreas: No pancreatic ductal dilation or evidence of acute inflammation. Spleen: No splenomegaly. Adrenals/Urinary Tract: Left adrenal nodule measures 2.7 x 2.3 cm on image 56/2 previously 2.8 x 2.4 cm. Right adrenal gland appears normal. No hydronephrosis. Urinary bladder is unremarkable for degree of distension. Stomach/Bowel: Stomach is unremarkable for degree of distension. No pathologic dilation of small or large bowel. Colonic diverticulosis without findings of acute diverticulitis. No evidence of acute bowel inflammation. Vascular/Lymphatic: Aortic atherosclerosis. Normal caliber abdominal aorta. Smooth IVC contours. No pathologically enlarged abdominal or pelvic lymph nodes. Reproductive: No discrete uterine mass identified. No suspicious adnexal lesion. Other: No significant abdominopelvic free fluid. No discrete peritoneal or omental nodularity. Laxity of the pelvic floor. Musculoskeletal: No aggressive lytic or blastic lesion of bone. Multilevel degenerative change of the spine. IMPRESSION: 1. Stable indeterminate left adrenal nodule. 2. Otherwise, no noncontrast enhanced CT evidence of metastatic disease. 3. Colonic diverticulosis without findings of acute diverticulitis. 4.  Aortic Atherosclerosis (ICD10-I70.0). Electronically Signed: By: Maudry Mayhew M.D. On: 12/22/2022 14:24

## 2023-02-16 NOTE — Assessment & Plan Note (Signed)
lipid poor hyperfunctioning adenoma vs mets.  FDG avid on PET scan.  Difficult biopsy due to patient's body habitus and deep position of the mass. Resolved on PET scan, suggesting that this lesion might be a metastatic lesion. CT showed stable lesion  Continue monitor

## 2023-02-16 NOTE — Assessment & Plan Note (Addendum)
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 s/p carboplatin and Taxol for 5 cycles, on Keytruda maintenance.  Labs are reviewed and discussed with patient. CT shows stable disease/NED Proceed with [facility]

## 2023-02-18 ENCOUNTER — Inpatient Hospital Stay (HOSPITAL_BASED_OUTPATIENT_CLINIC_OR_DEPARTMENT_OTHER): Payer: Medicare PPO | Admitting: Obstetrics and Gynecology

## 2023-02-18 VITALS — BP 158/72 | HR 110 | Temp 98.6°F | Resp 20 | Wt 285.2 lb

## 2023-02-18 DIAGNOSIS — Z5112 Encounter for antineoplastic immunotherapy: Secondary | ICD-10-CM | POA: Diagnosis not present

## 2023-02-18 DIAGNOSIS — C541 Malignant neoplasm of endometrium: Secondary | ICD-10-CM | POA: Diagnosis not present

## 2023-02-18 NOTE — Progress Notes (Signed)
Patient for US guided FNA Thyroid Nodule Biopsy on Thurs 02/19/2023, I called and spoke with the patient on the phone and gave pre-procedure instructions. Pt was made aware to be here at 12:30p and check in at the [facility] registration desk. Pt stated understanding.  Called 02/17/2023

## 2023-02-18 NOTE — Progress Notes (Signed)
Gynecologic Oncology Interval Visit   Referring Provider: Dr. Jean Rosenthal  Chief Complaint: FIGO grade III endometrial adenocarcinoma  Subjective:  Valerie Case is a 72 y.o. female who is seen in consultation from Dr. Jean Rosenthal for poorly differentiated endometrial adenocarcinoma, FIGO grade III, s/p radiation x 5 weeks to control bleeding, felt to be poor surgical candidate, received chemotherapy who presents to clinic for follow up.   She is currently on maintenance pembrolizumab.  She saw Dr. Cathie Hoops on 02/16/2023. Her last imaging study was a CT scan on 12/22/2022 as noted below. She has shortness of breath which is not new. She is tolerating pembrolizumab. She does have mild skin toxicity with dry skin and rash.  IMPRESSION: 1. Stable indeterminate left adrenal nodule. 2. Otherwise, no noncontrast enhanced CT evidence of metastatic disease. 3. Colonic diverticulosis without findings of acute diverticulitis. 4.  Aortic Atherosclerosis (ICD10-I70.0).  ADDENDUM: There is similar nodular enlargement of the left lobe of the thyroid, suggest further evaluation with thyroid ultrasound if not previously performed.  01/28/2023 Thyroid US  IMPRESSION: 1. Heterogeneous enlarged goitrous thyroid gland. 2. Nodule # 5 is a 3.2 cm TI-RADS category 3 nodule in the left lower gland meets criteria to consider fine-needle aspiration biopsy. Biopsy is recommended.  Thyroid US FNA scheduled for tomorrow.    She presents for a pelvic exam.   Gynecologic Oncology History Valerie Case is a pleasant female who is seen in consultation from Dr. Jean Rosenthal for poorly differentiated endometrial adenocarcinoma, figo grade III. Please see prior notes for complete detail  04/01/21 She was admitted  to [facility] with vaginal bleeding and received 2 units pRBCs and iron infusion. She underwent endometrial biopsy on which revealed poorly differentiated endometrial adenocarcinoma FIGO III  04/02/2021 BILATERAL LOWER  EXTREMITY VENOUS DOPPLER ULTRASOUND No evidence of deep venous thrombosis in either lower extremity.  04/16/2021 PET IMPRESSION: 1. Large hypermetabolic endometrial mass consistent with known endometrial cancer. Suspect direct invasion/involvement of the right adnexa. Right pelvic sidewall hypermetabolic adenopathy. 2. 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 3. No findings for metastatic disease involving the chest or bony structures.  04/24/2021 Tumor Board Documentation Patient to undergo radiation therapy. Plan to reassess after radiation with imaging to re-evaluate possible surgical intervention.     04/24/2021 - 05/29/2021: She completed WPRT Dose/Fx (Gy): 1.8 #Fx: 25 / 25. Total Dose (Gy): 45  06/12/2021 PET IMPRESSION: 1. Substantial reduction in size and activity of the uterine mass. Substantial reduction in size and activity of the right pelvic sidewall lymph node. 2. The moderately hypermetabolic left adrenal mass is stable in size back through earliest available compare comparison of 12/12/2020, with roughly similar activity level to previous. The density characteristics are not specific for adrenal adenoma. Possibilities include benign and malignant adrenal neoplasms versus metastatic lesion. 3. Other imaging findings of potential clinical significance: Left mastoid effusion. Coronary and aortic atherosclerosis. Systemic atherosclerosis. Mild cardiomegaly. Lax anterior abdominal wall. Suspected cholelithiasis. Sigmoid colon diverticulosis. Congenital anomalies of the C1 vertebra. Anterolisthesis at L4-5.  Hypermetabolic left adrenal mass has been worked up for possible pheochromocytoma which was negative. She was referred to urology and saw Dr. Lonna Cobb who recommended CT guided biopsy but this was refused by IR.   She has been followed by Dr. Cathie Hoops for iron deficiency anemia secondary to blood  loss in setting of endometrial cancer. She received IV iron and palliative radiation. Bleeding has stopped and hemoglobin has improved to  11.4. Blood sugars have been elevated and last HmgA1c was 7.9 (07/18/21)  08/05/2021 Imaging    MRI abdomen w wo contrast 1. Stable solid enhancing 2.7 cm left adrenal lesion, which was hypermetabolic on prior PET/CTs but is unchanged in size dating back to December 12, 2020. 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 However, unfortunately metastatic disease can not be entirely excluded and remains a pertinent differential consideration. Comparison with more remote prior imaging would be the most valuable tool in the assessment of this lesion, as demonstrating long-term stability would indicate this to be a benign lesion. However, if no prior imaging can be made available, would consider follow-up adrenal protocol CT with and without intravenous contrast material in 3 months as this would allow for further assessment of stability as well as enhancement and washout characteristics of the lesion potentially allowing for better characterization versus direct tissue sampling.2. Hepatomegaly and hepatic steatosis.3. Colonic diverticulosis without findings of acute diverticulitis      Her case was discussed at Tumor Board. Dr. Johnnette Litter felt like she would be a candidate for surgery. After further discussion she opted for non-surgical management.    08/16/2021 - 09/27/2021  completed  3 cycles of carbo-paclitaxel chemotherapy.   PET 10/03/21  - no residual hypermetabolism of the uterus suggestive of excellent response to treatment. No findings of metastatic disease. Resolution of hypermetabolism of left adrenal lesions. Diffuse marrow hypermetabolism likely secondary to udenyca/GCSF vs chemotherapy.   10/18/2021 -  11/08/2021 completed  2 cycles of carbo-paclitaxel chemotherapy.  11/29/2021 started on pembrolizumab 200 mg every 3 weeks.   01/22/2022 CT C/A/P IMPRESSION: 1. No findings of progressive metastatic disease within the chest, abdomen, or pelvis. 2. Left adrenal nodule is similar in size to 10/03/2021 PET. This was felt to be suspicious for treated metastasis on prior PET. 3. Hepatomegaly 4. Coronary artery atherosclerosis. Aortic Atherosclerosis (ICD10-I70.0). 5.  Tiny hiatal hernia.   04/30/2022 Imaging  CT C/A/P    CT chest abdomen pelvis w contrast  1. Similar to minimal decrease in size of a left adrenal nodule which was felt suspicious for metastatic disease on 10/03/2021 PET. 2. No evidence of new or progressive disease. 3. Coronary artery atherosclerosis. Aortic Atherosclerosis (ICD10-I70.0). 4. Incidental findings, including: Hepatomegaly. Possible constipation.   08/28/2022 CT C/A/P IMPRESSION: 1. Similar appearance of indeterminate left adrenal nodule. 2. Otherwise, no evidence of metastatic disease. 3. Hepatomegaly 4.  Possible constipation. 5. Coronary artery atherosclerosis. Aortic Atherosclerosis   12/22/2022. CT C/A/P IMPRESSION: 1. Stable indeterminate left adrenal nodule. 2. Otherwise, no noncontrast enhanced CT evidence of metastatic disease. 3. Colonic diverticulosis without findings of acute diverticulitis. 4.  Aortic Atherosclerosis (ICD10-I70.0).  GENETIC TESTING: Genetic counseling performed, patient declined testing  Tumor markers 08/20/21- Omnise[employer] NGS showed TMB 48.4 mt/mb [high], MSI High, PD-L1-TPS <1%, PIK3CA H1047Q, Negative for BRAF, HER2, NTRK1 RET. MMR was not reported.   .        Problem List: Patient Active Problem List   Diagnosis Date Noted   Thyroid nodule 02/16/2023   Neuropathy 11/21/2022   Allergy 08/08/2022   Skin rash 06/06/2022   Encounter for antineoplastic immunotherapy 12/20/2021   Cancer of endometrium (HCC) 11/28/2021    Encounter for antineoplastic chemotherapy 08/24/2021   Morbid obesity with body mass index (BMI) of 40.0 to 44.9 in adult [facility]) 08/24/2021   Anemia in chronic kidney  disease (CKD) 08/24/2021   Drug-induced constipation 08/23/2021   Port-A-Cath in place 08/15/2021   Venous insufficiency of both lower extremities 05/20/2021   Hepatomegaly 04/14/2021   Endometrial adenocarcinoma (HCC) 04/14/2021   Adrenal mass (HCC) 04/14/2021   Goals of care, counseling/discussion 04/14/2021   B12 deficiency 04/11/2021   AKI (acute kidney injury) (HCC) 04/01/2021   Essential hypertension 04/01/2021   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   Depression 04/01/2021   Recent unintentional weight loss over several months 04/01/2021   History of asthma 04/01/2021   Bilateral lower extremity edema 04/01/2021     Past Medical History: Past Medical History:  Diagnosis Date   Anemia    Aortic atherosclerosis (HCC)    Arthritis    Asthma    B12 deficiency    Cancer (HCC)    Cancer of endometrium (HCC)    Coronary artery disease    Depression 04/01/2021   Diabetes mellitus without complication (HCC)    Diverticulosis    Essential hypertension 04/01/2021   Hemorrhoids    History of rectal bleeding    Hx of dysplastic nevus 07/16/2010   RLQA   Hypertension    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   Mixed hyperlipidemia    Peripheral polyneuropathy    Sleep apnea     Past Surgical History: Past Surgical History:  Procedure Laterality Date   APPENDECTOMY     CESAREAN SECTION  01/23/1979   COLONOSCOPY     ESOPHAGOGASTRODUODENOSCOPY     IR IMAGING GUIDED PORT INSERTION  08/09/2021   TONSILLECTOMY  03/17/1956    Past Gynecologic History:  Post menopausal - 2006 STD: denies   OB History:  OB History  Gravida Para Term Preterm AB Living  1 1 1     1   SAB IAB Ectopic Multiple Live Births          1    # Outcome Date GA Lbr Len/2nd Weight Sex  Type Anes PTL Lv  1 Term 1980     CS-Unspec   LIV    Family History: Family History  Problem Relation Age of Onset   Breast cancer Mother 80   Cancer Mother    Cancer Father    Lung cancer Father    Breast cancer Cousin        dx 39s maternal    Social History: Social History   Socioeconomic History   Marital status: Divorced    Spouse name: Not on file   Number of children: Not on file   Years of education: Not on file   Highest education level: Not on file  Occupational History   Not on file  Tobacco Use   Smoking status: Never   Smokeless tobacco: Never  Vaping Use   Vaping status: Never Used  Substance and Sexual Activity   Alcohol use: Not Currently   Drug use: Never   Sexual activity: Not Currently  Other Topics Concern   Not on file  Social History Narrative      Social Determinants of Health   Financial Resource Strain: Low Risk  (01/15/2023)   [facility]   Overall Financial Resource Strain (CARDIA)    Difficulty of Paying Living Expenses: Not hard at all  Food Insecurity: No Food Insecurity (01/15/2023)   Received from [facility]   Hunger Vital Sign    Worried About Running Out of Food in the Last Year: Never true  Ran Out of Food in the Last Year: Never true  Transportation Needs: No Transportation Needs (01/15/2023)   Received from [facility] - Transportation    In the past 12 months, has lack of transportation kept you from medical appointments or from getting medications?: No    Lack of Transportation (Non-Medical): No  Physical Activity: Inactive (09/27/2021)   Exercise Vital Sign    Days of Exercise per Week: 0 days    Minutes of Exercise per Session: 0 min  Stress: Stress Concern Present (09/27/2021)   [employer] of Occupational Health - Occupational Stress Questionnaire    Feeling of Stress : Rather much  Social Connections: Moderately Integrated  (09/27/2021)   Social Connection and Isolation Panel [NHANES]    Frequency of Communication with Friends and Family: Three times a week    Frequency of Social Gatherings with Friends and Family: Three times a week    Attends Religious Services: 1 to 4 times per year    Active Member of Clubs or Organizations: No    Attends Banker Meetings: 1 to 4 times per year    Marital Status: Divorced  Intimate Partner Violence: Not At Risk (09/27/2021)   Humiliation, Afraid, Rape, and Kick questionnaire    Fear of Current or Ex-Partner: No    Emotionally Abused: No    Physically Abused: No    Sexually Abused: No    Allergies: Allergies  Allergen Reactions   Doxycycline Hives    Thrush and mouth sores   Aspirin Rash    Childhood reaction     Current Medications: Current Outpatient Medications  Medication Sig Dispense Refill   acetaminophen (TYLENOL) 650 MG CR tablet Take 1,300 mg by mouth every 8 (eight) hours as needed for pain.     albuterol (VENTOLIN HFA) 108 (90 Base) MCG/ACT inhaler Inhale 2 puffs into the lungs every 6 (six) hours as needed for wheezing or shortness of breath.     azelastine (ASTELIN) 0.1 % nasal spray Place 2 sprays into both nostrils daily. Use in each nostril as directed 30 mL 0   Cranberry-Vitamin C-Probiotic (AZO CRANBERRY PO) Take by mouth.     docusate sodium (COLACE) 100 MG capsule Take 1 capsule (100 mg total) by mouth 2 (two) times daily. 60 capsule 1   empagliflozin (JARDIANCE) 25 MG TABS tablet Take 25 mg by mouth daily.     ferrous sulfate 325 (65 FE) MG EC tablet Take 325 mg by mouth daily with breakfast.     FLUoxetine (PROZAC) 10 MG capsule Take 10 mg by mouth daily.     fluticasone (FLONASE) 50 MCG/ACT nasal spray Place 1 spray into both nostrils daily as needed for allergies or rhinitis.     furosemide (LASIX) 20 MG tablet Take 20 mg by mouth daily.     gabapentin (NEURONTIN) 100 MG capsule Take 1 capsule (100 mg total) by mouth daily. 90  capsule 1   hydrochlorothiazide (HYDRODIURIL) 12.5 MG tablet Take 12.5 mg by mouth daily.     hydrocortisone 2.5 % cream Apply topically 3 (three) times daily. 30 g 2   ibuprofen (ADVIL) 200 MG tablet Take 400 mg by mouth every 8 (eight) hours as needed for moderate pain.     Insulin Asp Prot & Asp FlexPen (NOVOLOG 70/30 MIX) (70-30) 100 UNIT/ML FlexPen Inject into the skin.     lidocaine-prilocaine (EMLA) cream Apply to affected area once 30 g 3   lisinopril (ZESTRIL) 40  MG tablet Take 40 mg by mouth at bedtime.     loratadine (CLARITIN) 10 MG tablet Take by mouth.     LORazepam (ATIVAN) 0.5 MG tablet Take 1 tablet (0.5 mg total) by mouth every 8 (eight) hours as needed for anxiety (nausea vomiting). 30 tablet 0   metFORMIN (GLUCOPHAGE-XR) 500 MG 24 hr tablet Take 1,000 mg by mouth at bedtime.     metoprolol succinate (TOPROL-XL) 25 MG 24 hr tablet Take 25 mg by mouth daily.     mupirocin ointment (BACTROBAN) 2 % Apply 1 Application topically 3 (three) times daily. 22 g 0   NOVOLIN 70/30 FLEXPEN (70-30) 100 UNIT/ML KwikPen 50-110 Units See admin instructions. 50 units in the morning, 110 units at bedtime     ondansetron (ZOFRAN) 8 MG tablet Take 1 tablet (8 mg total) by mouth 2 (two) times daily as needed. Start on the third day after chemotherapy. 30 tablet 1   OZEMPIC, 1 MG/DOSE, 4 MG/3ML SOPN Inject 1 mg into the skin every Tuesday.     potassium chloride SA (KLOR-CON M) 20 MEQ tablet Take 1 tablet (20 mEq total) by mouth daily. 3 tablet 0   prochlorperazine (COMPAZINE) 10 MG tablet Take 1 tablet (10 mg total) by mouth every 6 (six) hours as needed (Nausea or vomiting). 30 tablet 1   rosuvastatin (CRESTOR) 10 MG tablet Take 10 mg by mouth daily.     traMADol (ULTRAM) 50 MG tablet Take 2 tablets (100 mg total) by mouth every 6 (six) hours as needed. 90 tablet 0   triamcinolone ointment (KENALOG) 0.5 % Apply 1 Application topically 2 (two) times daily. 30 g 0   fluconazole (DIFLUCAN) 150 MG  tablet Take 1 tablet (150 mg total) by mouth daily. Take 1 tablet day one and repeat 2 days later. (Patient not taking: Reported on 01/02/2023) 2 tablet 0   No current facility-administered medications for this visit.   Facility-Administered Medications Ordered in Other Visits  Medication Dose Route Frequency Provider Last Rate Last Admin   heparin lock flush 100 UNIT/ML injection             Review of Systems General:  no complaints Skin: mild chronic rash and dryness/flaky skin on scalp due to IO Eyes: no complaints HEENT: no complaints Pulmonary: shortness of breath chronic and due to asthma Cardiac: no complaints Gastrointestinal: no complaints Genitourinary/Sexual: no complaints Ob/Gyn: no complaints Musculoskeletal: no complaints Hematology: no complaints Neurologic/Psych: no complaints    Objective:  Physical Examination:  BP (!) 158/72   Pulse (!) 110   Temp 98.6 F (37 C)   Resp 20   Wt 285 lb 3.2 oz (129.4 kg)   SpO2 100%   BMI 43.36 kg/m    GENERAL: Patient is a well appearing female in no acute distress HEENT:  Atraumatic and normocephalic. PERRL, neck supple. ABDOMEN:  Soft, nontender. Nondistended. No masses/ascites/hernia/or hepatomegaly.  NEURO:  Nonfocal. Well oriented.  Appropriate affect.  Pelvic: chaperoned by CMA EGBUS: improved erythema Cervix: palpable cervix smooth on exam with normal consistency. Challenging to see on exam and deviated inferiorly/posteriorly with speculum exam.  Need narrow longer Pederson speculum.  Vagina: no lesions, no discharge or bleeding Uterus: not grossly enlarged but limited by habitus BME: no palpable masses Rectovaginal: deferred   Lab Review No labs on site today  Radiologic Imaging: Per HPI   Assessment:  Valerie Case is a 72 y.o. female diagnosed with metastatic poorly differentiated endometrial adenocarcinoma (MSI-H) with right sidewall  and pelvic lymph node involvement s/p palliative WPRT d/t  bleeding, with evidence of completed response based on imaging, and doing well on maintenance pembrolizumab.   Vulvar candidiasis, resolved.   Thyroid nodule pending FNA  Adrenal mass, uncertain etiology. Pheocromocytoma workup was negative. Biopsy recommended by urology but denied by IR. Radiology recommended adrenal MRI. Stable findings. DDX: lipid poor adenoma vs metastatic disease  H/o Renal insufficiency (2.6 on 04/01/2021) improved  Medical co-morbidities complicating care: HTN, AODM, depression, morbid obesity, Chronic venous insufficiency of both lower extremities with h/o cellulitis, History of asthma. Body mass index is 43.36 kg/m.  Plan:   Problem List Items Addressed This Visit       Genitourinary   Endometrial adenocarcinoma (HCC) - Primary (Chronic)    Continue to follow up with Dr. Cathie Hoops. Repeat imaging pending.  She has a much improved performance status now compared to initial presentation. If she does have local recurrence surgery could be considered.   Germline genetic testing declined. May also consider in the future but prefers to hold off for now. She met with Lacy Duverney 12/04/21.   Continue to follow up with pcp to update her routine cancer screenings if repeat imaging negative.    Follow-up in 3 months for continued surveillance. Patient will see Dr Sonia Side for next visit. Prefers female providers.   The patient's diagnosis, an outline of the further diagnostic and laboratory studies which will be required, the recommendation, and alternatives were discussed.  All questions were answered to the patient's satisfaction.   Shasha Buchbinder Leta Jungling, MD

## 2023-02-19 ENCOUNTER — Ambulatory Visit
Admission: RE | Admit: 2023-02-19 | Discharge: 2023-02-19 | Disposition: A | Discharge status: Home / Self Care | Payer: Medicare PPO | Source: Ambulatory Visit | Attending: Oncology | Admitting: Oncology

## 2023-02-19 ENCOUNTER — Ambulatory Visit: Payer: Medicare PPO

## 2023-02-19 ENCOUNTER — Ambulatory Visit: Payer: Medicare PPO | Admitting: Oncology

## 2023-02-19 ENCOUNTER — Other Ambulatory Visit: Payer: Medicare PPO

## 2023-02-19 VITALS — BP 151/69 | HR 84

## 2023-02-19 DIAGNOSIS — E041 Nontoxic single thyroid nodule: Secondary | ICD-10-CM | POA: Insufficient documentation

## 2023-02-19 MED ORDER — LIDOCAINE HCL (PF) 1 % IJ SOLN
10.0000 mL | Freq: Once | INTRAMUSCULAR | Status: AC
  Administered 2023-02-19: 10 mL via INTRADERMAL
  Filled 2023-02-19: qty 10

## 2023-02-19 NOTE — Procedures (Signed)
Successful US guided FNA of left lower thyroid nodule No complications. See PACS for full report.  Pattricia Boss D, PA-C 02/19/2023, 2:20 PM

## 2023-02-24 LAB — CYTOLOGY - NON PAP

## 2023-03-09 ENCOUNTER — Inpatient Hospital Stay: Payer: Medicare PPO | Admitting: Internal Medicine

## 2023-03-09 ENCOUNTER — Encounter: Payer: Self-pay | Admitting: Gastroenterology

## 2023-03-09 ENCOUNTER — Inpatient Hospital Stay: Payer: Medicare PPO

## 2023-03-09 ENCOUNTER — Encounter: Payer: Self-pay | Admitting: Internal Medicine

## 2023-03-09 VITALS — BP 124/62 | HR 88 | Resp 18

## 2023-03-09 VITALS — BP 138/81 | HR 109 | Temp 100.2°F | Resp 20 | Wt 286.0 lb

## 2023-03-09 DIAGNOSIS — Z5112 Encounter for antineoplastic immunotherapy: Secondary | ICD-10-CM | POA: Diagnosis not present

## 2023-03-09 DIAGNOSIS — C541 Malignant neoplasm of endometrium: Secondary | ICD-10-CM

## 2023-03-09 LAB — COMPREHENSIVE METABOLIC PANEL
ALT: 19 U/L (ref 0–44)
AST: 22 U/L (ref 15–41)
Albumin: 4 g/dL (ref 3.5–5.0)
Alkaline Phosphatase: 48 U/L (ref 38–126)
Anion gap: 11 (ref 5–15)
BUN: 29 mg/dL — ABNORMAL HIGH (ref 8–23)
CO2: 21 mmol/L — ABNORMAL LOW (ref 22–32)
Calcium: 10.1 mg/dL (ref 8.9–10.3)
Chloride: 105 mmol/L (ref 98–111)
Creatinine, Ser: 1.04 mg/dL — ABNORMAL HIGH (ref 0.44–1.00)
GFR, Estimated: 57 mL/min — ABNORMAL LOW (ref >60)
Glucose, Bld: 140 mg/dL — ABNORMAL HIGH (ref 70–99)
Potassium: 4.1 mmol/L (ref 3.5–5.1)
Sodium: 137 mmol/L (ref 135–145)
Total Bilirubin: 0.4 mg/dL (ref <1.2)
Total Protein: 7.3 g/dL (ref 6.5–8.1)

## 2023-03-09 LAB — CBC WITH DIFFERENTIAL/PLATELET
Abs Immature Granulocytes: 0.07 10*3/uL (ref 0.00–0.07)
Basophils Absolute: 0.1 10*3/uL (ref 0.0–0.1)
Basophils Relative: 1 %
Eosinophils Absolute: 0.3 10*3/uL (ref 0.0–0.5)
Eosinophils Relative: 3 %
HCT: 35.7 % — ABNORMAL LOW (ref 36.0–46.0)
Hemoglobin: 11.4 g/dL — ABNORMAL LOW (ref 12.0–15.0)
Immature Granulocytes: 1 %
Lymphocytes Relative: 19 %
Lymphs Abs: 1.4 10*3/uL (ref 0.7–4.0)
MCH: 30.9 pg (ref 26.0–34.0)
MCHC: 31.9 g/dL (ref 30.0–36.0)
MCV: 96.7 fL (ref 80.0–100.0)
Monocytes Absolute: 0.7 10*3/uL (ref 0.1–1.0)
Monocytes Relative: 10 %
Neutro Abs: 4.9 10*3/uL (ref 1.7–7.7)
Neutrophils Relative %: 66 %
Platelets: 281 10*3/uL (ref 150–400)
RBC: 3.69 MIL/uL — ABNORMAL LOW (ref 3.87–5.11)
RDW: 13.6 % (ref 11.5–15.5)
WBC: 7.3 10*3/uL (ref 4.0–10.5)
nRBC: 0 % (ref 0.0–0.2)

## 2023-03-09 LAB — TSH: TSH: 0.853 u[IU]/mL (ref 0.350–4.500)

## 2023-03-09 MED ORDER — HEPARIN SOD (PORK) LOCK FLUSH 100 UNIT/ML IV SOLN
500.0000 [IU] | Freq: Once | INTRAVENOUS | Status: AC | PRN
  Administered 2023-03-09: 500 [IU]
  Filled 2023-03-09: qty 5

## 2023-03-09 MED ORDER — SODIUM CHLORIDE 0.9 % IV SOLN
Freq: Once | INTRAVENOUS | Status: AC
  Filled 2023-03-09: qty 250

## 2023-03-09 MED ORDER — SODIUM CHLORIDE 0.9 % IV SOLN
200.0000 mg | Freq: Once | INTRAVENOUS | Status: AC
  Administered 2023-03-09: 200 mg via INTRAVENOUS
  Filled 2023-03-09: qty 200

## 2023-03-09 NOTE — Patient Instructions (Signed)
 [facility] - A DEPT OF [facility]  Discharge Instructions: Thank you for choosing [facility] to provide your oncology and hematology care.  If you have a lab appointment with the Cancer Center, please go directly to the Cancer Center and check in at the registration area.  Wear comfortable clothing and clothing appropriate for easy access to any Portacath or PICC line.   We strive to give you quality time with your provider. You may need to reschedule your appointment if you arrive late (15 or more minutes).  Arriving late affects you and other patients whose appointments are after yours.  Also, if you miss three or more appointments without notifying the office, you may be dismissed from the clinic at the provider's discretion.      For prescription refill requests, have your pharmacy contact our office and allow 72 hours for refills to be completed.    Today you received the following chemotherapy and/or immunotherapy agents Valerie Case      To help prevent nausea and vomiting after your treatment, we encourage you to take your nausea medication as directed.  BELOW ARE SYMPTOMS THAT SHOULD BE REPORTED IMMEDIATELY: *FEVER GREATER THAN 100.4 F (38 C) OR HIGHER *CHILLS OR SWEATING *NAUSEA AND VOMITING THAT IS NOT CONTROLLED WITH YOUR NAUSEA MEDICATION *UNUSUAL SHORTNESS OF BREATH *UNUSUAL BRUISING OR BLEEDING *URINARY PROBLEMS (pain or burning when urinating, or frequent urination) *BOWEL PROBLEMS (unusual diarrhea, constipation, pain near the anus) TENDERNESS IN MOUTH AND THROAT WITH OR WITHOUT PRESENCE OF ULCERS (sore throat, sores in mouth, or a toothache) UNUSUAL RASH, SWELLING OR PAIN  UNUSUAL VAGINAL DISCHARGE OR ITCHING   Items with * indicate a potential emergency and should be followed up as soon as possible or go to the Emergency Department if any problems should occur.  Please show the CHEMOTHERAPY ALERT CARD or IMMUNOTHERAPY  ALERT CARD at check-in to the Emergency Department and triage nurse.  Should you have questions after your visit or need to cancel or reschedule your appointment, please contact [facility] - A DEPT OF [facility]  [phone] and follow the prompts.  Office hours are 8:00 a.m. to 4:30 p.m. Monday - Friday. Please note that voicemails left after 4:00 p.m. may not be returned until the following business day.  We are closed weekends and major holidays. You have access to a nurse at all times for urgent questions. Please call the main number to the clinic [phone] and follow the prompts.  For any non-urgent questions, you may also contact your provider using MyChart. We now offer e-Visits for anyone 25 and older to request care online for non-urgent symptoms. For details visit [email or web address].   Also download the MyChart app! Go to the app store, search "MyChart", open the app, select [place], and log in with your MyChart username and password.

## 2023-03-09 NOTE — Progress Notes (Signed)
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 s/p carboplatin and Taxol for 5 cycles, on Keytruda maintenance.  Labs are reviewed and discussed with patient. CT shows stable disease/NED Low temp of 100.2.  Overall she is feeling well.  Labs are looking good.  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  FDG avid on PET scan.  Difficult biopsy due to patient's body habitus and deep position of the mass. Resolved on PET scan, suggesting that this lesion might be a metastatic lesion. CT showed stable lesion  Continue monitor     Anemia in chronic kidney disease (CKD) Stable hemoglobin   Encounter for antineoplastic immunotherapy Treatment plan as listed above.    Thyroid nodule Obtain Thyroid nodule biopsy.  Recommend patient to follow up with endocrinology for management.     No orders of the defined types were placed in this encounter.   Return of visit:  3 weeks Lab MD Greggory Brandy, MD, [name] [facility] Hematology Oncology 03/09/2023     HISTORY OF PRESENTING ILLNESS:   Valerie Case is a  72 y.o.  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 Oncology History  Endometrial adenocarcinoma (HCC)  12/12/2020 Imaging   12/12/2020, CT abdomen pelvis with contrast showed no radiographic evidence of urinary tract neoplasm, calculi, hydronephrosis.  2.6 cm nonspecific left  adrenal mass. 02/12/2021 CT hematuria work-up showed small uterine fibroids, no findings to explain hematuria.Hepatomegaly, diverticulosis without evidence of diverticulitis, Coronary artery disease   04/01/2021 -  Hospital Admission   04/01/2021 - 04/03/2021, patient was hospitalized due to symptomatic anemia, hemoglobin 6.9, heavy postmenopausal bleeding with passing large clots.  She also presented with increased creatinine level to 2.58 compared to her baseline level of 0.9 in November 2022.  Patient received IV iron infusion and 2 units of PRBC during the hospital stay..  04/02/2021, iron panel showed iron saturation 37, ferritin 37, TIBC 333-the studies were done after patient received blood transfusion.  At discharge, hemoglobin was 7.7.   04/01/2021 Initial Diagnosis   04/01/2020, endometrial biopsy showed poorly differentiated endometrial adenocarcinoma. Omniseq NGS showed TMB 48.4 mt/mb [high], MSI High, PD-L1-TPS <1%, PIK3CA H1047Q, Negative for BRAF, HER2, NTRK1 RET.     04/10/2021 Cancer Staging   Staging form: Corpus Uteri - Carcinoma and Carcinosarcoma, AJCC 8th Edition - Clinical stage from 04/10/2021: FIGO Stage III, calculated as Stage Unknown (cT3, cNX, cM0) - Signed by Rickard Patience, MD on 08/24/2021 Stage prefix: Initial diagnosis   04/15/2021 Imaging   PET showed 1. Large hypermetabolic endometrial mass consistent with known endometrial cancer. Suspect direct invasion/involvement of the right adnexa. Right pelvic sidewall hypermetabolic adenopathy.2. 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 3. No findings for metastatic disease involving the chest or bonystructures.     04/24/2021 - 05/29/2021 Radiation Therapy   status post radiation to pelvis   08/05/2021 Imaging   MRI abdomen w wo  contrast 1. Stable solid enhancing 2.7 cm left adrenal lesion, which was hypermetabolic on prior PET/CTs but  is unchanged in size dating back to December 12, 2020. 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 However, unfortunately metastatic disease can not be entirely excluded and remains a pertinent differential consideration. Comparison with more remote prior imaging would be the most valuable tool in the assessment of this lesion, as demonstrating long-term stability would indicate this to be a benign lesion. However, if no prior imaging can be made available, would consider follow-up adrenal protocol CT with and without intravenous contrast material in 3 months as this would allow for further assessment of stability as well as enhancement and washout characteristics of the lesion potentially allowing for better characterization versus direct tissue sampling.2. Hepatomegaly and hepatic steatosis.3. Colonic diverticulosis without findings of acute diverticulitis    08/16/2021 - 09/27/2021 Chemotherapy   UTERINE Carboplatin AUC 5 / Paclitaxel q21d x 3      10/03/2021 Imaging   PET 1. No residual hypermetabolism in the uterus suggesting an excellent response to treatment. No findings for metastatic disease. 2. Resolution of hypermetabolism in the left adrenal gland lesion suggesting this was metastatic disease. 3. Diffuse marrow hypermetabolism likely due to rebound from chemotherapy or marrow stimulating drugs   10/18/2021 - 11/08/2021 Chemotherapy   AUC 5 / Paclitaxel/ Keytruda Q21d  x 2 cycles   11/29/2021 -  Chemotherapy   Patient is on Treatment Plan : UTERINE Pembrolizumab (200) q21d     04/30/2022 Imaging   CT chest abdomen pelvis w contrast  1. Similar to minimal decrease in size of a left adrenal nodule which was felt suspicious for metastatic disease on 10/03/2021 PET. 2. No evidence of new or progressive disease. 3. Coronary  artery atherosclerosis. Aortic Atherosclerosis (ICD10-I70.0). 4. Incidental findings, including: Hepatomegaly. Possible constipation.   08/28/2022 Imaging   CT chest abdomen pelvis w contrast showed 1. Similar appearance of indeterminate left adrenal nodule. 2. Otherwise, no evidence of metastatic disease. 3. Hepatomegaly 4.  Possible constipation. 5. Coronary artery atherosclerosis. Aortic Atherosclerosis (ICD10-I70.0).   12/22/2022 Imaging   CT chest abdomen pelvis w contrast showed 1. Stable indeterminate left adrenal nodule. 2. Otherwise, no noncontrast enhanced CT evidence of metastatic disease. 3. Colonic diverticulosis without findings of acute diverticulitis. 4.  Aortic Atherosclerosis (ICD10-I70.0).         INTERVAL HISTORY Valerie Case is a 72 y.o. female who has above history reviewed by me today presents for follow up visit for anemia and endometrial cancer Overall she tolerates treatment She has baseline shortness of breath.  Has mild rash which has been ongoing with Keytruda infusion.  Denies any worsening.  Energy level is fair.  Today in the clinic, low temp of 100.2.  Denies any chills, she has runny nose from her allergies.  She is scheduled for colonoscopy end of this week.   Review of Systems  Constitutional:  Positive for fatigue. Negative for chills and fever.  HENT:   Negative for hearing loss and voice change.   Eyes:  Negative for eye problems.  Respiratory:  Negative for chest tightness and cough.   Cardiovascular:  Negative for chest pain.  Gastrointestinal:  Negative for abdominal distention, abdominal pain and blood in stool.  Endocrine: Negative for hot flashes.  Genitourinary:  Negative for difficulty urinating, frequency and vaginal bleeding.  Musculoskeletal:  Positive for arthralgias.  Skin:  Negative for itching and rash.  Neurological:  Negative for extremity weakness and headaches.  Hematological:  Negative for adenopathy.   Psychiatric/Behavioral:  Negative for confusion.     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   Mixed hyperlipidemia    Peripheral polyneuropathy    Sleep apnea     SURGICAL HISTORY: Past Surgical History:  Procedure Laterality Date   APPENDECTOMY     CESAREAN SECTION  01/23/1979   COLONOSCOPY     ESOPHAGOGASTRODUODENOSCOPY     IR IMAGING GUIDED PORT INSERTION  08/09/2021   TONSILLECTOMY  03/17/1956    SOCIAL HISTORY: Social History   Socioeconomic History   Marital status: Divorced    Spouse name: Not on file   Number of children: Not on file   Years of education: Not on file   Highest education level: Not on file  Occupational History   Not on file  Tobacco Use   Smoking status: Never   Smokeless tobacco: Never  Vaping Use   Vaping status: Never Used  Substance and Sexual Activity   Alcohol use: Not Currently   Drug use: Never   Sexual activity: Not Currently  Other Topics Concern   Not on file  Social History Narrative      Social Drivers of Health   Financial Resource Strain: Low Risk  (01/15/2023)   Received from [facility]   Overall Financial Resource Strain (CARDIA)    Difficulty of Paying Living Expenses: Not hard at all  Food Insecurity: No Food Insecurity (01/15/2023)   Received from [facility]   Hunger Vital Sign    Worried About Running Out of Food in the Last Year: Never true    Ran Out of Food in the Last Year: Never true   Transportation Needs: No Transportation Needs (01/15/2023)   Received from [facility] - Transportation    In the past 12 months, has lack of transportation kept you from medical appointments or from getting medications?: No    Lack of Transportation (Non-Medical): No  Physical Activity: Inactive (09/27/2021)   Exercise Vital Sign    Days of Exercise per Week: 0 days    Minutes of Exercise per Session: 0 min  Stress: Stress Concern Present (09/27/2021)   [employer] of Occupational Health - Occupational Stress Questionnaire    Feeling of Stress : Rather much  Social Connections: Moderately Integrated (09/27/2021)   Social Connection and Isolation Panel [NHANES]    Frequency of Communication with Friends and Family: Three times a week    Frequency of Social Gatherings with Friends and Family: Three times a week    Attends Religious Services: 1 to 4 times per year    Active Member of Clubs or Organizations: No    Attends Banker Meetings: 1 to 4 times per year  Marital Status: Divorced  Catering manager Violence: Not At Risk (09/27/2021)   Humiliation, Afraid, Rape, and Kick questionnaire    Fear of Current or Ex-Partner: No    Emotionally Abused: No    Physically Abused: No    Sexually Abused: No    FAMILY HISTORY: Family History  Problem Relation Age of Onset   Breast cancer Mother 34   Cancer Mother    Cancer Father    Lung cancer Father    Breast cancer Cousin        dx 47s maternal    ALLERGIES:  is allergic to doxycycline and aspirin.  MEDICATIONS:  Current Outpatient Medications  Medication Sig Dispense Refill   acetaminophen (TYLENOL) 650 MG CR tablet Take 1,300 mg by mouth every 8 (eight) hours as needed for pain.     albuterol (VENTOLIN HFA) 108 (90 Base) MCG/ACT inhaler Inhale 2 puffs into the lungs every 6 (six) hours as needed for wheezing or shortness of breath.     azelastine (ASTELIN) 0.1 % nasal spray Place 2  sprays into both nostrils daily. Use in each nostril as directed 30 mL 0   Cranberry-Vitamin C-Probiotic (AZO CRANBERRY PO) Take by mouth.     docusate sodium (COLACE) 100 MG capsule Take 1 capsule (100 mg total) by mouth 2 (two) times daily. 60 capsule 1   empagliflozin (JARDIANCE) 25 MG TABS tablet Take 25 mg by mouth daily.     ferrous sulfate 325 (65 FE) MG EC tablet Take 325 mg by mouth daily with breakfast.     fluconazole (DIFLUCAN) 150 MG tablet Take 1 tablet (150 mg total) by mouth daily. Take 1 tablet day one and repeat 2 days later. (Patient not taking: Reported on 01/02/2023) 2 tablet 0   FLUoxetine (PROZAC) 10 MG capsule Take 10 mg by mouth daily.     fluticasone (FLONASE) 50 MCG/ACT nasal spray Place 1 spray into both nostrils daily as needed for allergies or rhinitis.     furosemide (LASIX) 20 MG tablet Take 20 mg by mouth daily.     gabapentin (NEURONTIN) 100 MG capsule Take 1 capsule (100 mg total) by mouth daily. 90 capsule 1   hydrochlorothiazide (HYDRODIURIL) 12.5 MG tablet Take 12.5 mg by mouth daily.     hydrocortisone 2.5 % cream Apply topically 3 (three) times daily. 30 g 2   ibuprofen (ADVIL) 200 MG tablet Take 400 mg by mouth every 8 (eight) hours as needed for moderate pain.     Insulin Asp Prot & Asp FlexPen (NOVOLOG 70/30 MIX) (70-30) 100 UNIT/ML FlexPen Inject into the skin.     lidocaine-prilocaine (EMLA) cream Apply to affected area once 30 g 3   lisinopril (ZESTRIL) 40 MG tablet Take 40 mg by mouth at bedtime.     loratadine (CLARITIN) 10 MG tablet Take by mouth.     LORazepam (ATIVAN) 0.5 MG tablet Take 1 tablet (0.5 mg total) by mouth every 8 (eight) hours as needed for anxiety (nausea vomiting). 30 tablet 0   metFORMIN (GLUCOPHAGE-XR) 500 MG 24 hr tablet Take 1,000 mg by mouth at bedtime.     metoprolol succinate (TOPROL-XL) 25 MG 24 hr tablet Take 25 mg by mouth daily.     mupirocin ointment (BACTROBAN) 2 % Apply 1 Application topically 3 (three) times daily.  22 g 0   NOVOLIN 70/30 FLEXPEN (70-30) 100 UNIT/ML KwikPen 50-110 Units See admin instructions. 50 units in the morning, 110 units at bedtime     ondansetron (  ZOFRAN) 8 MG tablet Take 1 tablet (8 mg total) by mouth 2 (two) times daily as needed. Start on the third day after chemotherapy. 30 tablet 1   OZEMPIC, 1 MG/DOSE, 4 MG/3ML SOPN Inject 1 mg into the skin every Tuesday.     potassium chloride SA (KLOR-CON M) 20 MEQ tablet Take 1 tablet (20 mEq total) by mouth daily. 3 tablet 0   prochlorperazine (COMPAZINE) 10 MG tablet Take 1 tablet (10 mg total) by mouth every 6 (six) hours as needed (Nausea or vomiting). 30 tablet 1   rosuvastatin (CRESTOR) 10 MG tablet Take 10 mg by mouth daily.     traMADol (ULTRAM) 50 MG tablet Take 2 tablets (100 mg total) by mouth every 6 (six) hours as needed. 90 tablet 0   triamcinolone ointment (KENALOG) 0.5 % Apply 1 Application topically 2 (two) times daily. 30 g 0   No current facility-administered medications for this visit.   Facility-Administered Medications Ordered in Other Visits  Medication Dose Route Frequency Provider Last Rate Last Admin   heparin lock flush 100 UNIT/ML injection              PHYSICAL EXAMINATION: ECOG PERFORMANCE STATUS: 1 - Symptomatic but completely ambulatory  Physical Exam Constitutional:      General: She is not in acute distress.    Appearance: She is obese.  HENT:     Head: Normocephalic and atraumatic.  Eyes:     General: No scleral icterus. Cardiovascular:     Rate and Rhythm: Regular rhythm. Tachycardia present.     Heart sounds: Murmur heard.  Pulmonary:     Effort: Pulmonary effort is normal. No respiratory distress.  Abdominal:     General: Bowel sounds are normal. There is no distension.     Palpations: Abdomen is soft.  Musculoskeletal:        General: No deformity. Normal range of motion.     Cervical back: Normal range of motion.     Right lower leg: Edema present.     Left lower leg: Edema  present.     Comments:    Skin:    General: Skin is warm and dry.     Findings: No rash.     Comments: Chronic venous sufficiency skin changes  Neurological:     Mental Status: She is alert and oriented to person, place, and time. Mental status is at baseline.     Cranial Nerves: No cranial nerve deficit.     Coordination: Coordination normal.  Psychiatric:        Mood and Affect: Mood normal.    LABORATORY DATA:  I have reviewed the data as listed    Latest Ref Rng & Units 03/09/2023   12:55 PM 02/16/2023   10:08 AM 01/23/2023   10:24 AM  CBC  WBC 4.0 - 10.5 K/uL 7.3  6.2  6.8   Hemoglobin 12.0 - 15.0 g/dL 40.1  02.7  25.3   Hematocrit 36.0 - 46.0 % 35.7  35.6  36.1   Platelets 150 - 400 K/uL 281  271  297       Latest Ref Rng & Units 03/09/2023   12:55 PM 02/16/2023   10:08 AM 01/23/2023   10:24 AM  CMP  Glucose 70 - 99 mg/dL 664  403  474   BUN 8 - 23 mg/dL 29  37  37   Creatinine 0.44 - 1.00 mg/dL 2.59  5.63  8.75   Sodium 135 - 145 mmol/L  137  141  137   Potassium 3.5 - 5.1 mmol/L 4.1  4.0  4.9   Chloride 98 - 111 mmol/L 105  109  111   CO2 22 - 32 mmol/L 21  20  19    Calcium 8.9 - 10.3 mg/dL 62.1  9.1  30.8   Total Protein 6.5 - 8.1 g/dL 7.3  7.0  7.5   Total Bilirubin <1.2 mg/dL 0.4  0.3  0.4   Alkaline Phos 38 - 126 U/L 48  47  56   AST 15 - 41 U/L 22  21  22    ALT 0 - 44 U/L 19  20  21        RADIOGRAPHIC STUDIES: I have personally reviewed the radiological images as listed and agreed with the findings in the report. [place] FNA BX THYROID 1ST LESION AFIRMA Result Date: 02/19/2023 INDICATION: Indeterminate thyroid nodule meeting ACR TI-RADS criteria for biopsy. PROCEDURE: ULTRASOUND GUIDED FINE NEEDLE ASPIRATION OF INDETERMINATE THYROID NODULE COMPARISON:  Ultrasound thyroid 02/07/2023. MEDICATIONS: Local 1% lidocaine only. ANESTHESIA/SEDATION: Local 1% lidocaine only. COMPLICATIONS: None immediate. Post procedure, the patient was given an ice pack which was  applied and discharge instructions which the patient verbally voiced understanding. The patient denied any post procedure site pain, bruising and denied any other procedure related complaints. All of the patient's questions were answered prior to discharge. TECHNIQUE: Informed written consent was obtained from the patient after a discussion of the risks, benefits, non-diagnostic sampling and reflex molecular testing with ThyroSeq and alternatives to treatment. Questions regarding the procedure were encouraged and answered. A timeout was performed prior to the initiation of the procedure. Pre-procedural ultrasound scanning demonstrated unchanged size and appearance of the indeterminate nodule within the left thyroid lobe. The procedure was planned. The neck was prepped in the usual sterile fashion, and a sterile drape was applied covering the operative field. A timeout was performed prior to the initiation of the procedure. Local anesthesia was provided with 1% lidocaine. Under direct ultrasound guidance, 5 FNA biopsies were performed of the left lower thyroid nodule with a 25 gauge needle. Multiple ultrasound images were saved for procedural documentation purposes. The samples were prepared and submitted to pathology. One of the passes was taken for ThyroSeq molecular testing if indicated. Limited post procedural scanning was negative for hematoma or additional complication. Dressings were placed. The patient tolerated the above procedures procedure well without immediate postprocedural complication. FINDINGS: Nodule reference number based on prior diagnostic ultrasound: 5 Maximum size: 3.2 cm Location: Left; lower ACR TI-RADS risk category:  TR3 (3 points) Prior biopsy:  No Reason for biopsy: meets ACR TI-RADS criteria Ultrasound imaging confirms appropriate placement of the needles within the thyroid nodule. IMPRESSION: Technically successful ultrasound guided fine needle aspiration biopsy of the left lower  thyroid nodule. This exam was performed by Pattricia Boss PA-C, and was supervised by Dr. and interpreted by Dr. Fredia Sorrow. Electronically Signed   By: Irish Lack M.D.   On: 02/19/2023 15:55   US THYROID Result Date: 02/07/2023 CLINICAL DATA:  Goiter. EXAM: THYROID ULTRASOUND TECHNIQUE: Ultrasound examination of the thyroid gland and adjacent soft tissues was performed. COMPARISON:  None Available. FINDINGS: Parenchymal Echotexture: Moderately heterogenous Isthmus: 0.2 cm Right lobe: 4.4 x 2.5 x 2.2 cm Left lobe: 4.6 x 2.9 x 2.8 cm _________________________________________________________ Estimated total number of nodules >/= 1 cm: 2 Number of spongiform nodules >/=  2 cm not described below (TR1): 0 Number of mixed cystic and solid nodules >/= 1.5 cm not  described below (TR2): 0 _________________________________________________________ Diffusely enlarged, heterogeneous and goitrous thyroid gland. Nodule # 1, # 2 and # 3 are subcentimeter nodules scattered throughout the right gland. No further follow-up is recommended. Nodule # 4: Cluster of smaller isoechoic solid nodules in the right lower gland. Measured independently, no individual nodule measures larger than 1 cm. No further follow-up. Nodule # 5: Isoechoic ill-defined solid nodule in the left lower gland measures approximately 3.2 x 2.5 x 1.8 cm. Findings are consistent with TI-RADS category 3. **Given size (>/= 2.5 cm) and appearance, fine needle aspiration of this mildly suspicious nodule should be considered based on TI-RADS criteria. IMPRESSION: 1. Heterogeneous enlarged goitrous thyroid gland. 2. Nodule # 5 is a 3.2 cm TI-RADS category 3 nodule in the left lower gland meets criteria to consider fine-needle aspiration biopsy. Biopsy is recommended. The above is in keeping with the ACR TI-RADS recommendations - J Am Coll Radiol 2017;14:587-595. Electronically Signed   By: Malachy Moan M.D.   On: 02/07/2023 14:38   MM 3D SCREENING MAMMOGRAM  BILATERAL BREAST Result Date: 12/30/2022 CLINICAL DATA:  Screening. EXAM: DIGITAL SCREENING BILATERAL MAMMOGRAM WITH TOMOSYNTHESIS AND CAD TECHNIQUE: Bilateral screening digital craniocaudal and mediolateral oblique mammograms were obtained. Bilateral screening digital breast tomosynthesis was performed. The images were evaluated with computer-aided detection. COMPARISON:  Previous exam(s). ACR Breast Density Category a: The breasts are almost entirely fatty. FINDINGS: There are no findings suspicious for malignancy. IMPRESSION: No mammographic evidence of malignancy. A result letter of this screening mammogram will be mailed directly to the patient. RECOMMENDATION: Screening mammogram in one year. (Code:SM-B-01Y) BI-RADS CATEGORY  1: Negative. Electronically Signed   By: Edwin Cap M.D.   On: 12/30/2022 13:06   CT CHEST ABDOMEN PELVIS WO CONTRAST Addendum Date: 12/22/2022 ADDENDUM REPORT: 12/22/2022 14:29 ADDENDUM: Mentioned in the body of the report but not the impression: There is similar nodular enlargement of the left lobe of the thyroid, suggest further evaluation with thyroid ultrasound if not previously performed. Electronically Signed   By: Maudry Mayhew M.D.   On: 12/22/2022 14:29   Result Date: 12/22/2022 CLINICAL DATA:  History of endometrial adenocarcinoma, follow-up. * Tracking Code: BO * EXAM: CT CHEST, ABDOMEN AND PELVIS WITHOUT CONTRAST TECHNIQUE: Multidetector CT imaging of the chest, abdomen and pelvis was performed following the standard protocol without IV contrast. RADIATION DOSE REDUCTION: This exam was performed according to the departmental dose-optimization program which includes automated exposure control, adjustment of the mA and/or kV according to patient size and/or use of iterative reconstruction technique. COMPARISON:  Multiple priors including most recent CT August 22, 2022 FINDINGS: CT CHEST FINDINGS Cardiovascular: Right chest Port-A-Cath with tip at the superior  cavoatrial junction. Normal size heart. Three-vessel coronary artery calcifications. Mediastinum/Nodes: No supraclavicular adenopathy. Similar irregular lobularity along the left side of the thyroid gland. No pathologically enlarged mediastinal, hilar or axillary lymph nodes. The esophagus is grossly unremarkable. Lungs/Pleura: Left lower lobe pulmonary nodule measuring 3 mm on image 91/3 is stable over multiple prior examinations dating back to at least 01/22/2022 favoring a benign etiology. No new suspicious pulmonary nodules or masses. Scattered atelectasis/scarring. Minimal biapical pleuroparenchymal scarring. Musculoskeletal: No aggressive lytic or blastic lesion of bone. Multilevel degenerative change of the spine. Similar subcutaneous nodules about the posterior right and midline chest wall measuring up to 11 mm favoring sebaceous cysts. CT ABDOMEN PELVIS FINDINGS Hepatobiliary: Hypodense segment 4 a hepatic lesion now measures 15 mm on image 55/2 previously 3.1 cm favored to reflect an involuting cyst. Gallbladder  is unremarkable. No biliary ductal dilation. Pancreas: No pancreatic ductal dilation or evidence of acute inflammation. Spleen: No splenomegaly. Adrenals/Urinary Tract: Left adrenal nodule measures 2.7 x 2.3 cm on image 56/2 previously 2.8 x 2.4 cm. Right adrenal gland appears normal. No hydronephrosis. Urinary bladder is unremarkable for degree of distension. Stomach/Bowel: Stomach is unremarkable for degree of distension. No pathologic dilation of small or large bowel. Colonic diverticulosis without findings of acute diverticulitis. No evidence of acute bowel inflammation. Vascular/Lymphatic: Aortic atherosclerosis. Normal caliber abdominal aorta. Smooth IVC contours. No pathologically enlarged abdominal or pelvic lymph nodes. Reproductive: No discrete uterine mass identified. No suspicious adnexal lesion. Other: No significant abdominopelvic free fluid. No discrete peritoneal or omental  nodularity. Laxity of the pelvic floor. Musculoskeletal: No aggressive lytic or blastic lesion of bone. Multilevel degenerative change of the spine. IMPRESSION: 1. Stable indeterminate left adrenal nodule. 2. Otherwise, no noncontrast enhanced CT evidence of metastatic disease. 3. Colonic diverticulosis without findings of acute diverticulitis. 4.  Aortic Atherosclerosis (ICD10-I70.0). Electronically Signed: By: Maudry Mayhew M.D. On: 12/22/2022 14:24

## 2023-03-09 NOTE — Progress Notes (Signed)
Patient states that her asthma has been acting up for the past couple of days due to the weather, which is making her have a little bit of shortness of breath with some wheezing.

## 2023-03-13 ENCOUNTER — Ambulatory Visit: Payer: Medicare PPO | Admitting: Anesthesiology

## 2023-03-13 ENCOUNTER — Other Ambulatory Visit: Payer: Self-pay

## 2023-03-13 ENCOUNTER — Ambulatory Visit
Admission: RE | Admit: 2023-03-13 | Discharge: 2023-03-13 | Disposition: A | Discharge status: Home / Self Care | Payer: Medicare PPO | Attending: Gastroenterology | Admitting: Gastroenterology

## 2023-03-13 ENCOUNTER — Encounter
Admission: RE | Disposition: A | Discharge status: Home / Self Care | Payer: Self-pay | Source: Home / Self Care | Attending: Gastroenterology

## 2023-03-13 ENCOUNTER — Encounter: Payer: Self-pay | Admitting: Gastroenterology

## 2023-03-13 ENCOUNTER — Encounter: Payer: Self-pay | Admitting: Oncology

## 2023-03-13 DIAGNOSIS — I10 Essential (primary) hypertension: Secondary | ICD-10-CM | POA: Insufficient documentation

## 2023-03-13 DIAGNOSIS — R12 Heartburn: Secondary | ICD-10-CM | POA: Diagnosis not present

## 2023-03-13 DIAGNOSIS — Z8542 Personal history of malignant neoplasm of other parts of uterus: Secondary | ICD-10-CM | POA: Diagnosis not present

## 2023-03-13 DIAGNOSIS — D509 Iron deficiency anemia, unspecified: Secondary | ICD-10-CM | POA: Diagnosis present

## 2023-03-13 DIAGNOSIS — R1310 Dysphagia, unspecified: Secondary | ICD-10-CM | POA: Insufficient documentation

## 2023-03-13 DIAGNOSIS — E119 Type 2 diabetes mellitus without complications: Secondary | ICD-10-CM | POA: Diagnosis not present

## 2023-03-13 DIAGNOSIS — Z539 Procedure and treatment not carried out, unspecified reason: Secondary | ICD-10-CM | POA: Diagnosis not present

## 2023-03-13 HISTORY — DX: Diverticulosis of intestine, part unspecified, without perforation or abscess without bleeding: K57.90

## 2023-03-13 HISTORY — DX: Unspecified osteoarthritis, unspecified site: M19.90

## 2023-03-13 HISTORY — DX: Sleep apnea, unspecified: G47.30

## 2023-03-13 HISTORY — PX: ESOPHAGOGASTRODUODENOSCOPY (EGD) WITH PROPOFOL: SHX5813

## 2023-03-13 HISTORY — PX: COLONOSCOPY WITH PROPOFOL: SHX5780

## 2023-03-13 HISTORY — DX: Atherosclerotic heart disease of native coronary artery without angina pectoris: I25.10

## 2023-03-13 HISTORY — DX: Mixed hyperlipidemia: E78.2

## 2023-03-13 HISTORY — DX: Atherosclerosis of aorta: I70.0

## 2023-03-13 HISTORY — DX: Deficiency of other specified B group vitamins: E53.8

## 2023-03-13 HISTORY — DX: Anemia, unspecified: D64.9

## 2023-03-13 HISTORY — DX: Unspecified hemorrhoids: K64.9

## 2023-03-13 HISTORY — DX: Personal history of other diseases of the digestive system: Z87.19

## 2023-03-13 HISTORY — DX: Polyneuropathy, unspecified: G62.9

## 2023-03-13 HISTORY — DX: Malignant neoplasm of endometrium: C54.1

## 2023-03-13 LAB — GLUCOSE, CAPILLARY: Glucose-Capillary: 175 mg/dL — ABNORMAL HIGH (ref 70–99)

## 2023-03-13 SURGERY — COLONOSCOPY WITH PROPOFOL
Anesthesia: General

## 2023-03-13 MED ORDER — SODIUM CHLORIDE 0.9 % IV SOLN: INTRAVENOUS | Status: DC

## 2023-03-13 NOTE — OR Nursing (Signed)
To be reschedule

## 2023-03-13 NOTE — Anesthesia Preprocedure Evaluation (Signed)
Anesthesia Evaluation  Patient identified by MRN, date of birth, ID band Patient awake    Reviewed: Allergy & Precautions, NPO status , Patient's Chart, lab work & pertinent test results  Airway Mallampati: II  TM Distance: >3 FB Neck ROM: Full    Dental no notable dental hx. (+) Teeth Intact, Dental Advisory Given   Pulmonary sleep apnea    Pulmonary exam normal breath sounds clear to auscultation       Cardiovascular hypertension, + dysrhythmias Atrial Fibrillation + pacemaker  Rhythm:Regular Rate:Normal     Neuro/Psych negative neurological ROS  negative psych ROS   GI/Hepatic   Endo/Other  diabetes    Renal/GU      Musculoskeletal  (+) Arthritis ,    Abdominal   Peds  Hematology   Anesthesia Other Findings All: Metoprolol, amlodipine sulfa, clindamycin, lipitor  Reproductive/Obstetrics                             Anesthesia Physical Anesthesia Plan  ASA: 3  Anesthesia Plan: General   Post-op Pain Management: Precedex and Tylenol PO (pre-op)*   Induction: Intravenous  PONV Risk Score and Plan: 4 or greater and Treatment may vary due to age or medical condition, Ondansetron and Midazolam  Airway Management Planned: LMA and Oral ETT  Additional Equipment: None  Intra-op Plan:   Post-operative Plan: Extubation in OR  Informed Consent: I have reviewed the patients History and Physical, chart, labs and discussed the procedure including the risks, benefits and alternatives for the proposed anesthesia with the patient or authorized representative who has indicated his/her understanding and acceptance.     Dental advisory given and Interpreter used for interveiw  Plan Discussed with:   Anesthesia Plan Comments:        Anesthesia Quick Evaluation  

## 2023-03-16 ENCOUNTER — Encounter: Payer: Self-pay | Admitting: Gastroenterology

## 2023-03-16 NOTE — H&P (Signed)
 Pre-Procedure H&P   Patient ID: Valerie Case is a 72 y.o. female.  Gastroenterology Provider: Elspeth Ozell Jungling, DO  Referring Provider: Luke Barefoot, NP PCP: Sadie Manna, MD  Date: 03/17/2023  HPI Valerie Case is a 72 y.o. female who presents today for Esophagogastroduodenoscopy and Colonoscopy for IDA, pill dysphagia, phx colon polyps .  Patient with difficulty swallowing pills on occasion.  No issues with solids or liquids.  No odynophagia.  Most recent hemoglobin 11 MCV 98 platelets 271,000 creatinine 1.07 iron  saturation 21% TIBC 337  Last colonoscopy 2016 reportedly normal Colonoscopy in 2011 with pandiverticulosis internal hemorrhoids and 2 adenomatous polyps  Patient takes Ozempic  at home with last dose being Tuesday, December 24.  Undergoing treatment for endometrial cancer  No family history of colon cancer or colon polyps.    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   Mixed hyperlipidemia    Peripheral polyneuropathy    Sleep apnea     Past Surgical History:  Procedure Laterality Date   APPENDECTOMY     CESAREAN SECTION  01/23/1979   COLONOSCOPY     COLONOSCOPY WITH PROPOFOL  N/A 03/13/2023   Procedure: COLONOSCOPY WITH PROPOFOL ;  Surgeon: Jungling Elspeth Ozell, DO;  Location: [facility] ENDOSCOPY;  Service: Gastroenterology;  Laterality: N/A;   ESOPHAGOGASTRODUODENOSCOPY     ESOPHAGOGASTRODUODENOSCOPY (EGD) WITH PROPOFOL  N/A 03/13/2023   Procedure: ESOPHAGOGASTRODUODENOSCOPY (EGD) WITH  PROPOFOL ;  Surgeon: Jungling Elspeth Ozell, DO;  Location: [facility] ENDOSCOPY;  Service: Gastroenterology;  Laterality: N/A;   IR IMAGING GUIDED PORT INSERTION  08/09/2021   TONSILLECTOMY  03/17/1956    Family History No h/o GI disease or malignancy  Review of Systems  Constitutional:  Negative for activity change, appetite change, chills, diaphoresis, fatigue, fever and unexpected weight change.  HENT:  Negative for trouble swallowing and voice change.   Respiratory:  Negative for shortness of breath and wheezing.   Cardiovascular:  Negative for chest pain, palpitations and leg swelling.  Gastrointestinal:  Negative for abdominal distention, abdominal pain, anal bleeding, blood in stool, constipation, diarrhea, nausea, rectal pain and vomiting.  Musculoskeletal:  Negative for arthralgias and myalgias.  Skin:  Negative for color change and pallor.  Neurological:  Negative for dizziness, syncope and weakness.  Psychiatric/Behavioral:  Negative for confusion.   All other systems reviewed and are negative.    Medications Current Facility-Administered Medications on File Prior to Encounter  Medication Dose Route Frequency Provider Last Rate Last Admin   heparin  lock flush 100 UNIT/ML injection            Current Outpatient Medications on File Prior to Encounter  Medication Sig Dispense Refill   acetaminophen  (TYLENOL ) 650 MG CR tablet Take 1,300 mg by mouth every 8 (eight) hours as needed for pain.     albuterol  (VENTOLIN  HFA) 108 (90 Base) MCG/ACT inhaler Inhale 2 puffs into the lungs every 6 (six) hours as needed for wheezing or shortness of breath.  azelastine  (ASTELIN ) 0.1 % nasal spray Place 2 sprays into both nostrils daily. Use in each nostril as directed 30 mL 0   Cranberry-Vitamin C-Probiotic (AZO CRANBERRY PO) Take by mouth.     docusate sodium  (COLACE) 100 MG capsule Take 1 capsule (100 mg total) by mouth 2 (two) times daily. 60 capsule 1   empagliflozin (JARDIANCE) 25 MG TABS  tablet Take 25 mg by mouth daily.     ferrous sulfate 325 (65 FE) MG EC tablet Take 325 mg by mouth daily with breakfast.     fluconazole  (DIFLUCAN ) 150 MG tablet Take 1 tablet (150 mg total) by mouth daily. Take 1 tablet day one and repeat 2 days later. (Patient not taking: Reported on 01/02/2023) 2 tablet 0   FLUoxetine  (PROZAC ) 10 MG capsule Take 10 mg by mouth daily.     fluticasone  (FLONASE ) 50 MCG/ACT nasal spray Place 1 spray into both nostrils daily as needed for allergies or rhinitis.     furosemide (LASIX) 20 MG tablet Take 20 mg by mouth daily.     gabapentin  (NEURONTIN ) 100 MG capsule Take 1 capsule (100 mg total) by mouth daily. 90 capsule 1   hydrochlorothiazide  (HYDRODIURIL ) 12.5 MG tablet Take 12.5 mg by mouth daily.     hydrocortisone  2.5 % cream Apply topically 3 (three) times daily. 30 g 2   ibuprofen (ADVIL) 200 MG tablet Take 400 mg by mouth every 8 (eight) hours as needed for moderate pain.     Insulin  Asp Prot & Asp FlexPen (NOVOLOG  70/30 MIX) (70-30) 100 UNIT/ML FlexPen Inject into the skin.     lidocaine -prilocaine  (EMLA ) cream Apply to affected area once 30 g 3   lisinopril  (ZESTRIL ) 40 MG tablet Take 40 mg by mouth at bedtime.     loratadine (CLARITIN) 10 MG tablet Take by mouth.     LORazepam  (ATIVAN ) 0.5 MG tablet Take 1 tablet (0.5 mg total) by mouth every 8 (eight) hours as needed for anxiety (nausea vomiting). 30 tablet 0   metFORMIN  (GLUCOPHAGE -XR) 500 MG 24 hr tablet Take 1,000 mg by mouth at bedtime.     metoprolol  succinate (TOPROL -XL) 25 MG 24 hr tablet Take 25 mg by mouth daily.     mupirocin  ointment (BACTROBAN ) 2 % Apply 1 Application topically 3 (three) times daily. 22 g 0   NOVOLIN 70/30 FLEXPEN (70-30) 100 UNIT/ML KwikPen 50-110 Units See admin instructions. 50 units in the morning, 110 units at bedtime     ondansetron  (ZOFRAN ) 8 MG tablet Take 1 tablet (8 mg total) by mouth 2 (two) times daily as needed. Start on the third day after chemotherapy. 30  tablet 1   OZEMPIC , 1 MG/DOSE, 4 MG/3ML SOPN Inject 1 mg into the skin every Tuesday.     potassium chloride  SA (KLOR-CON  M) 20 MEQ tablet Take 1 tablet (20 mEq total) by mouth daily. 3 tablet 0   prochlorperazine  (COMPAZINE ) 10 MG tablet Take 1 tablet (10 mg total) by mouth every 6 (six) hours as needed (Nausea or vomiting). 30 tablet 1   rosuvastatin  (CRESTOR ) 10 MG tablet Take 10 mg by mouth daily.     traMADol  (ULTRAM ) 50 MG tablet Take 2 tablets (100 mg total) by mouth every 6 (six) hours as needed. 90 tablet 0   triamcinolone  ointment (KENALOG ) 0.5 % Apply 1 Application topically 2 (two) times daily. 30 g 0    Pertinent medications related to GI and procedure were reviewed by me with the patient prior to the procedure  Current Facility-Administered Medications:    0.9 %  sodium chloride  infusion, , Intravenous, Continuous, Onita Elspeth Sharper, DO  Facility-Administered Medications Ordered in Other Encounters:    heparin  lock flush 100 UNIT/ML injection, , , ,   sodium chloride          Allergies  Allergen Reactions   Doxycycline  Hives    Thrush and mouth sores   Aspirin Rash    Childhood reaction    Allergies were reviewed by me prior to the procedure  Objective   Body mass index is 42.03 kg/m. Vitals:   03/17/23 1249  BP: (!) 155/84  Pulse: (!) 104  Resp: 18  Temp: (!) 97 F (36.1 C)  TempSrc: Temporal  SpO2: 99%  Weight: 125.4 kg  Height: 5' 8 (1.727 m)     Physical Exam Vitals and nursing note reviewed.  Constitutional:      General: She is not in acute distress.    Appearance: Normal appearance. She is obese. She is not ill-appearing, toxic-appearing or diaphoretic.  HENT:     Head: Normocephalic and atraumatic.     Nose: Nose normal.     Mouth/Throat:     Mouth: Mucous membranes are moist.     Pharynx: Oropharynx is clear.  Eyes:     General: No scleral icterus.    Extraocular Movements: Extraocular movements intact.  Cardiovascular:      Rate and Rhythm: Regular rhythm. Tachycardia present.     Heart sounds: Normal heart sounds. No murmur heard.    No friction rub. No gallop.  Pulmonary:     Effort: Pulmonary effort is normal. No respiratory distress.     Breath sounds: Normal breath sounds. No wheezing, rhonchi or rales.  Abdominal:     General: Bowel sounds are normal. There is no distension.     Palpations: Abdomen is soft.     Tenderness: There is no abdominal tenderness. There is no guarding or rebound.  Musculoskeletal:     Cervical back: Neck supple.     Right lower leg: No edema.     Left lower leg: No edema.  Skin:    General: Skin is warm and dry.     Coloration: Skin is not jaundiced or pale.     Comments: Port in place- right chest wall  Neurological:     General: No focal deficit present.     Mental Status: She is alert and oriented to person, place, and time. Mental status is at baseline.  Psychiatric:        Mood and Affect: Mood normal.        Behavior: Behavior normal.        Thought Content: Thought content normal.        Judgment: Judgment normal.      Assessment:  Valerie Case is a 72 y.o. female  who presents today for Esophagogastroduodenoscopy and Colonoscopy for IDA, pill dysphagia, phx colon polyps .  Plan:  Esophagogastroduodenoscopy and Colonoscopy with possible intervention today  Esophagogastroduodenoscopy and Colonoscopy with possible biopsy, control of bleeding, polypectomy, and interventions as necessary has been discussed with the patient/patient representative. Informed consent was obtained from the patient/patient representative after explaining the indication, nature, and risks of the procedure including but not limited to death, bleeding, perforation, missed neoplasm/lesions, cardiorespiratory compromise, and reaction to medications. Opportunity for questions was given and appropriate answers were provided. Patient/patient representative has verbalized understanding  is amenable to undergoing the procedure.   Elspeth Sharper Onita, DO  [facility] Gastroenterology  Portions of the record may have been created with voice recognition software. Occasional wrong-word or 'sound-a-like' substitutions may have occurred due to the inherent limitations of voice recognition software.  Read the chart carefully and recognize, using context, where substitutions may have occurred.

## 2023-03-17 ENCOUNTER — Encounter
Admission: RE | Disposition: A | Discharge status: Home / Self Care | Payer: Self-pay | Source: Home / Self Care | Attending: Gastroenterology

## 2023-03-17 ENCOUNTER — Encounter: Payer: Self-pay | Admitting: Gastroenterology

## 2023-03-17 ENCOUNTER — Ambulatory Visit: Payer: Medicare PPO | Admitting: Anesthesiology

## 2023-03-17 ENCOUNTER — Ambulatory Visit
Admission: RE | Admit: 2023-03-17 | Discharge: 2023-03-17 | Disposition: A | Discharge status: Home / Self Care | Payer: Medicare PPO | Attending: Gastroenterology | Admitting: Gastroenterology

## 2023-03-17 DIAGNOSIS — K640 First degree hemorrhoids: Secondary | ICD-10-CM | POA: Diagnosis not present

## 2023-03-17 DIAGNOSIS — K269 Duodenal ulcer, unspecified as acute or chronic, without hemorrhage or perforation: Secondary | ICD-10-CM | POA: Diagnosis not present

## 2023-03-17 DIAGNOSIS — K2970 Gastritis, unspecified, without bleeding: Secondary | ICD-10-CM | POA: Diagnosis not present

## 2023-03-17 DIAGNOSIS — K219 Gastro-esophageal reflux disease without esophagitis: Secondary | ICD-10-CM | POA: Diagnosis not present

## 2023-03-17 DIAGNOSIS — E119 Type 2 diabetes mellitus without complications: Secondary | ICD-10-CM | POA: Diagnosis not present

## 2023-03-17 DIAGNOSIS — R1310 Dysphagia, unspecified: Secondary | ICD-10-CM | POA: Diagnosis not present

## 2023-03-17 DIAGNOSIS — Z7984 Long term (current) use of oral hypoglycemic drugs: Secondary | ICD-10-CM | POA: Diagnosis not present

## 2023-03-17 DIAGNOSIS — D509 Iron deficiency anemia, unspecified: Secondary | ICD-10-CM | POA: Diagnosis present

## 2023-03-17 DIAGNOSIS — K5730 Diverticulosis of large intestine without perforation or abscess without bleeding: Secondary | ICD-10-CM | POA: Insufficient documentation

## 2023-03-17 DIAGNOSIS — Z794 Long term (current) use of insulin: Secondary | ICD-10-CM | POA: Diagnosis not present

## 2023-03-17 DIAGNOSIS — G4730 Sleep apnea, unspecified: Secondary | ICD-10-CM | POA: Diagnosis not present

## 2023-03-17 DIAGNOSIS — I2510 Atherosclerotic heart disease of native coronary artery without angina pectoris: Secondary | ICD-10-CM | POA: Diagnosis not present

## 2023-03-17 DIAGNOSIS — J45909 Unspecified asthma, uncomplicated: Secondary | ICD-10-CM | POA: Diagnosis not present

## 2023-03-17 DIAGNOSIS — Z7985 Long-term (current) use of injectable non-insulin antidiabetic drugs: Secondary | ICD-10-CM | POA: Diagnosis not present

## 2023-03-17 DIAGNOSIS — K224 Dyskinesia of esophagus: Secondary | ICD-10-CM | POA: Diagnosis not present

## 2023-03-17 DIAGNOSIS — C541 Malignant neoplasm of endometrium: Secondary | ICD-10-CM | POA: Diagnosis not present

## 2023-03-17 DIAGNOSIS — I10 Essential (primary) hypertension: Secondary | ICD-10-CM | POA: Diagnosis not present

## 2023-03-17 DIAGNOSIS — Z6841 Body Mass Index (BMI) 40.0 and over, adult: Secondary | ICD-10-CM | POA: Diagnosis not present

## 2023-03-17 HISTORY — PX: COLONOSCOPY: SHX5424

## 2023-03-17 HISTORY — PX: ESOPHAGOGASTRODUODENOSCOPY: SHX5428

## 2023-03-17 HISTORY — PX: BIOPSY: SHX5522

## 2023-03-17 LAB — GLUCOSE, CAPILLARY: Glucose-Capillary: 173 mg/dL — ABNORMAL HIGH (ref 70–99)

## 2023-03-17 SURGERY — COLONOSCOPY
Anesthesia: General

## 2023-03-17 MED ORDER — HEPARIN SOD (PORK) LOCK FLUSH 100 UNIT/ML IV SOLN
INTRAVENOUS | Status: AC
  Filled 2023-03-17: qty 5

## 2023-03-17 MED ORDER — PROPOFOL 10 MG/ML IV BOLUS
INTRAVENOUS | Status: DC | PRN
  Administered 2023-03-17: 50 mg via INTRAVENOUS
  Administered 2023-03-17: 30 mg via INTRAVENOUS
  Administered 2023-03-17: 100 mg via INTRAVENOUS
  Administered 2023-03-17 (×2): 50 mg via INTRAVENOUS
  Administered 2023-03-17: 20 mg via INTRAVENOUS

## 2023-03-17 MED ORDER — PHENYLEPHRINE HCL (PRESSORS) 10 MG/ML IV SOLN
INTRAVENOUS | Status: DC | PRN
  Administered 2023-03-17 (×3): 160 ug via INTRAVENOUS

## 2023-03-17 MED ORDER — LIDOCAINE HCL (PF) 2 % IJ SOLN
INTRAMUSCULAR | Status: AC
  Filled 2023-03-17: qty 5

## 2023-03-17 MED ORDER — PROPOFOL 500 MG/50ML IV EMUL
INTRAVENOUS | Status: DC | PRN
  Administered 2023-03-17: 125 ug/kg/min via INTRAVENOUS

## 2023-03-17 MED ORDER — GLYCOPYRROLATE 0.2 MG/ML IJ SOLN
INTRAMUSCULAR | Status: DC | PRN
  Administered 2023-03-17: .2 mg via INTRAVENOUS

## 2023-03-17 MED ORDER — SODIUM CHLORIDE 0.9 % IV SOLN: INTRAVENOUS | Status: DC

## 2023-03-17 MED ORDER — GLYCOPYRROLATE 0.2 MG/ML IJ SOLN
INTRAMUSCULAR | Status: AC
  Filled 2023-03-17: qty 1

## 2023-03-17 MED ORDER — PHENYLEPHRINE 80 MCG/ML (10ML) SYRINGE FOR IV PUSH (FOR BLOOD PRESSURE SUPPORT)
PREFILLED_SYRINGE | INTRAVENOUS | Status: AC
  Filled 2023-03-17: qty 10

## 2023-03-17 MED ORDER — PROPOFOL 10 MG/ML IV BOLUS
INTRAVENOUS | Status: AC
Start: 2023-03-17 — End: ?
  Filled 2023-03-17: qty 20

## 2023-03-17 MED ORDER — HEPARIN SOD (PORK) LOCK FLUSH 100 UNIT/ML IV SOLN: 250.0000 [IU] | Freq: Once | INTRAVENOUS | Status: DC

## 2023-03-17 MED ORDER — LIDOCAINE HCL (CARDIAC) PF 100 MG/5ML IV SOSY
PREFILLED_SYRINGE | INTRAVENOUS | Status: DC | PRN
  Administered 2023-03-17: 100 mg via INTRAVENOUS

## 2023-03-17 MED ORDER — SODIUM CHLORIDE 0.9% FLUSH: 10.0000 mL | Freq: Once | INTRAVENOUS | Status: DC

## 2023-03-17 NOTE — Op Note (Signed)
 [facility] Gastroenterology Patient Name: Valerie Case Procedure Date: 03/17/2023 1:37 PM MRN: [id number] Account #: [id number] Date of Birth: 21-Feb-1951 Admit Type: Outpatient Age: 72 [facility] END[facility] ROOM 1 Gender: Female Note Status: Finalized Instrument Name: Upper Endoscope [id number] Procedure:             Upper GI endoscopy Indications:           Dysphagia, Iron  deficiency anemia Providers:             Elspeth Ozell Jungling DO, DO Medicines:             Monitored Anesthesia Care Complications:         No immediate complications. Estimated blood loss:                         Minimal. Procedure:             Pre-Anesthesia Assessment:                        - Prior to the procedure, a History and Physical was                         performed, and patient medications and allergies were                         reviewed. The patient is competent. The risks and                         benefits of the procedure and the sedation options and                         risks were discussed with the patient. All questions                         were answered and informed consent was obtained.                         Patient identification and proposed procedure were                         verified by the physician, the nurse, the anesthetist                         and the technician in the endoscopy suite. Mental                         Status Examination: alert and oriented. Airway                         Examination: normal oropharyngeal airway and neck                         mobility. Respiratory Examination: clear to                         auscultation. CV Examination: regular rate and rhythm.                         Prophylactic Antibiotics: The patient does not require  prophylactic antibiotics. Prior Anticoagulants: The                         patient has taken no anticoagulant or antiplatelet                         agents. ASA Grade  Assessment: III - A patient with                         severe systemic disease. After reviewing the risks and                         benefits, the patient was deemed in satisfactory                         condition to undergo the procedure. The anesthesia                         plan was to use monitored anesthesia care (MAC).                         Immediately prior to administration of medications,                         the patient was re-assessed for adequacy to receive                         sedatives. The heart rate, respiratory rate, oxygen                         saturations, blood pressure, adequacy of pulmonary                         ventilation, and response to care were monitored                         throughout the procedure. The physical status of the                         patient was re-assessed after the procedure.                        After obtaining informed consent, the endoscope was                         passed under direct vision. Throughout the procedure,                         the patient's blood pressure, pulse, and oxygen                         saturations were monitored continuously. The Endoscope                         was introduced through the mouth, and advanced to the                         second part of duodenum. The upper GI endoscopy was  accomplished without difficulty. The patient tolerated                         the procedure well. Findings:      A few localized erosions without bleeding were found in the duodenal       bulb. Estimated blood loss: none.      The exam of the duodenum was otherwise normal.      Hematin (altered blood/coffee-ground-like material) was found in the       gastric body and in the gastric antrum. Biopsies were taken with a cold       forceps for Helicobacter pylori testing. Estimated blood loss was       minimal.      The exam of the stomach was otherwise normal.      The Z-line  was regular. Estimated blood loss: none.      Esophagogastric landmarks were identified: the gastroesophageal junction       was found at 42 cm from the incisors.      No endoscopic abnormality was evident in the esophagus to explain the       patient's complaint of dysphagia. Estimated blood loss: none.      Abnormal motility was noted in the esophagus. The cricopharyngeus was       normal. There is spasticity of the esophageal body. The distal       esophagus/lower esophageal sphincter is open. Estimated blood loss: none.      The examined esophagus was normal. Estimated blood loss: none. Impression:            - Duodenal erosions without bleeding.                        - Hematin (altered blood/coffee-ground-like material)                         in the gastric body and in the gastric antrum.                         Biopsied.                        - Z-line regular.                        - Esophagogastric landmarks identified.                        - No endoscopic esophageal abnormality to explain                         patient's dysphagia.                        - Abnormal esophageal motility, suspicious for                         presbyesophagus.                        - Normal esophagus. Recommendation:        - Patient has a contact number available for  emergencies. The signs and symptoms of potential                         delayed complications were discussed with the patient.                         Return to normal activities tomorrow. Written                         discharge instructions were provided to the patient.                        - Discharge patient to home.                        - Resume previous diet.                        - Continue present medications.                        - Await pathology results.                        - Return to GI clinic as previously scheduled.                        - proceed with colonoscopy. see op  report for further                         details.                        - The findings and recommendations were discussed with                         the patient's family.                        - The findings and recommendations were discussed with                         the patient. Procedure Code(s):     --- Professional ---                        [phone], Esophagogastroduodenoscopy, flexible,                         transoral; with biopsy, single or multiple Diagnosis Code(s):     --- Professional ---                        K26.9, Duodenal ulcer, unspecified as acute or                         chronic, without hemorrhage or perforation                        K92.2, Gastrointestinal hemorrhage, unspecified                        R13.10, Dysphagia, unspecified  K22.4, Dyskinesia of esophagus                        D50.9, Iron  deficiency anemia, unspecified CPT copyright 2022 American [employer]. All rights reserved. The codes documented in this report are preliminary and upon coder review may  be revised to meet current compliance requirements. Attending Participation:      I personally performed the entire procedure. Elspeth Jungling, DO Elspeth Ozell Jungling DO, DO 03/17/2023 1:50:06 PM This report has been signed electronically. Number of Addenda: 0 Note Initiated On: 03/17/2023 1:37 PM Estimated Blood Loss:  Estimated blood loss was minimal.      [facility]

## 2023-03-17 NOTE — Transfer of Care (Signed)
 Immediate Anesthesia Transfer of Care Note  Patient: Valerie Case  Procedure(s) Performed: COLONOSCOPY ESOPHAGOGASTRODUODENOSCOPY (EGD) BIOPSY  Patient Location: PACU  Anesthesia Type:General  Level of Consciousness: drowsy  Airway & Oxygen Therapy: Patient Spontanous Breathing  Post-op Assessment: Report given to RN and Post -op Vital signs reviewed and stable  Post vital signs: Reviewed and stable  Last Vitals:  Vitals Value Taken Time  BP 116/69 03/17/23 1421  Temp 97.5 03/17/23  1421  Pulse 91 03/17/23 1422  Resp 15 03/17/23 1421  SpO2 95 % 03/17/23 1422  Vitals shown include unfiled device data.  Last Pain:  Vitals:   03/17/23 1418  TempSrc:   PainSc: Asleep         Complications: No notable events documented.

## 2023-03-17 NOTE — Interval H&P Note (Signed)
 History and Physical Interval Note: Preprocedure H&P from 03/17/23  was reviewed and there was no interval change after seeing and examining the patient.  Written consent was obtained from the patient after discussion of risks, benefits, and alternatives. Patient has consented to proceed with Esophagogastroduodenoscopy and Colonoscopy with possible intervention   03/17/2023 1:30 PM  Valerie Case  has presented today for surgery, with the diagnosis of Iron  deficiency anemia, unspecified iron  deficiency anemia type (D50.9) Heartburn (R12) Pill dysphagia (R13.10) History of endometrial cancer (Z85.42).  The various methods of treatment have been discussed with the patient and family. After consideration of risks, benefits and other options for treatment, the patient has consented to  Procedure(s): COLONOSCOPY (N/A) ESOPHAGOGASTRODUODENOSCOPY (EGD) (N/A) as a surgical intervention.  The patient's history has been reviewed, patient examined, no change in status, stable for surgery.  I have reviewed the patient's chart and labs.  Questions were answered to the patient's satisfaction.     Elspeth Ozell Jungling

## 2023-03-17 NOTE — Anesthesia Preprocedure Evaluation (Addendum)
 Anesthesia Evaluation  Patient identified by MRN, date of birth, ID band Patient awake    Reviewed: Allergy & Precautions, H&P , NPO status , Patient's Chart, lab work & pertinent test results  Airway Mallampati: III  TM Distance: >3 FB Neck ROM: full    Dental no notable dental hx. (+) Missing,    Pulmonary asthma , sleep apnea    Pulmonary exam normal        Cardiovascular Exercise Tolerance: Poor hypertension, (-) angina + CAD   Rhythm:Regular + Systolic murmurs and + Peripheral Edema    Neuro/Psych  PSYCHIATRIC DISORDERS  Depression     Neuromuscular disease (Peripheral polyneuropathy)    GI/Hepatic Neg liver ROS,GERD  ,,  Endo/Other  diabetes  Class 3 obesity  Renal/GU Renal InsufficiencyRenal disease  negative genitourinary   Musculoskeletal   Abdominal  (+) + obese  Peds  Hematology  (+) Blood dyscrasia, anemia   Anesthesia Other Findings Endometrial adenocarcinoma Adrenal mass  Past Medical History: No date: Anemia No date: Aortic atherosclerosis (HCC) No date: Arthritis No date: Asthma No date: B12 deficiency No date: Cancer (HCC) No date: Cancer of endometrium (HCC) No date: Coronary artery disease 04/01/2021: Depression No date: Diabetes mellitus without complication (HCC) No date: Diverticulosis 04/01/2021: Essential hypertension No date: Hemorrhoids No date: History of rectal bleeding 07/16/2010: Hx of dysplastic nevus     Comment:  RLQA No date: Hypertension 04/01/2021: Insulin  dependent type 2 diabetes mellitus (HCC) No date: Mixed hyperlipidemia No date: Peripheral polyneuropathy No date: Sleep apnea  Past Surgical History: No date: APPENDECTOMY 01/23/1979: CESAREAN SECTION No date: COLONOSCOPY 03/13/2023: COLONOSCOPY WITH PROPOFOL ; N/A     Comment:  Procedure: COLONOSCOPY WITH PROPOFOL ;  Surgeon: Onita Elspeth Sharper, DO;  Location: [facility] ENDOSCOPY;  Service:                Gastroenterology;  Laterality: N/A; No date: ESOPHAGOGASTRODUODENOSCOPY 03/13/2023: ESOPHAGOGASTRODUODENOSCOPY (EGD) WITH PROPOFOL ; N/A     Comment:  Procedure: ESOPHAGOGASTRODUODENOSCOPY (EGD) WITH               PROPOFOL ;  Surgeon: Onita Elspeth Sharper, DO;  Location:              [facility] ENDOSCOPY;  Service: Gastroenterology;  Laterality:               N/A; 08/09/2021: IR IMAGING GUIDED PORT INSERTION 03/17/1956: TONSILLECTOMY     Reproductive/Obstetrics negative OB ROS                             Anesthesia Physical Anesthesia Plan  ASA: 3  Anesthesia Plan: General   Post-op Pain Management: Minimal or no pain anticipated   Induction: Intravenous  PONV Risk Score and Plan: Propofol  infusion and TIVA  Airway Management Planned: Natural Airway  Additional Equipment:   Intra-op Plan:   Post-operative Plan:   Informed Consent: I have reviewed the patients History and Physical, chart, labs and discussed the procedure including the risks, benefits and alternatives for the proposed anesthesia with the patient or authorized representative who has indicated his/her understanding and acceptance.     Dental Advisory Given  Plan Discussed with: CRNA and Surgeon  Anesthesia Plan Comments:         Anesthesia Quick Evaluation

## 2023-03-17 NOTE — Op Note (Signed)
 [facility] Gastroenterology Patient Name: Valerie Case Procedure Date: 03/17/2023 1:50 PM MRN: [id number] Account #: [id number] Date of Birth: Feb 24, 1951 Admit Type: Outpatient Age: 72 Room: [facility] ENDO ROOM 1 Gender: Female Note Status: Finalized Instrument Name: Peds Colonoscope [id number] Procedure:             Colonoscopy Indications:           Iron  deficiency anemia Providers:             Elspeth Ozell Jungling DO, DO Medicines:             Monitored Anesthesia Care Complications:         No immediate complications. Estimated blood loss: None. Procedure:             Pre-Anesthesia Assessment:                        - Prior to the procedure, a History and Physical was                         performed, and patient medications and allergies were                         reviewed. The patient is competent. The risks and                         benefits of the procedure and the sedation options and                         risks were discussed with the patient. All questions                         were answered and informed consent was obtained.                         Patient identification and proposed procedure were                         verified by the physician, the nurse, the anesthetist                         and the technician in the endoscopy suite. Mental                         Status Examination: alert and oriented. Airway                         Examination: normal oropharyngeal airway and neck                         mobility. Respiratory Examination: clear to                         auscultation. CV Examination: regular rate and rhythm.                         Prophylactic Antibiotics: The patient does not require                         prophylactic antibiotics. Prior  Anticoagulants: The                         patient has taken no anticoagulant or antiplatelet                         agents. ASA Grade Assessment: III - A patient with                          severe systemic disease. After reviewing the risks and                         benefits, the patient was deemed in satisfactory                         condition to undergo the procedure. The anesthesia                         plan was to use monitored anesthesia care (MAC).                         Immediately prior to administration of medications,                         the patient was re-assessed for adequacy to receive                         sedatives. The heart rate, respiratory rate, oxygen                         saturations, blood pressure, adequacy of pulmonary                         ventilation, and response to care were monitored                         throughout the procedure. The physical status of the                         patient was re-assessed after the procedure.                        After obtaining informed consent, the colonoscope was                         passed under direct vision. Throughout the procedure,                         the patient's blood pressure, pulse, and oxygen                         saturations were monitored continuously. The                         Colonoscope was introduced through the anus and                         advanced to the the cecum, identified by appendiceal  orifice and ileocecal valve. The colonoscopy was                         somewhat difficult due to multiple diverticula in the                         colon, a redundant colon, the patient's body habitus                         and the patient's oxygen desaturation. Successful                         completion of the procedure was aided by straightening                         and shortening the scope to obtain bowel loop                         reduction, using scope torsion, applying abdominal                         pressure, lavage, performing chin lift and                         administering oxygen. The patient tolerated the                          procedure fairly well. The quality of the bowel                         preparation was evaluated using the BBPS [facility] Bowel                         Preparation Scale) with scores of: Right Colon = 2                         (minor amount of residual staining, small fragments of                         stool and/or opaque liquid, but mucosa seen well),                         Transverse Colon = 3 (entire mucosa seen well with no                         residual staining, small fragments of stool or opaque                         liquid) and Left Colon = 3 (entire mucosa seen well                         with no residual staining, small fragments of stool or                         opaque liquid). The total BBPS score equals 8. The  quality of the bowel preparation was excellent. The                         ileocecal valve, appendiceal orifice, and rectum were                         photographed. Findings:      The perianal and digital rectal examinations were normal. Pertinent       negatives include normal sphincter tone.      Was able to reach cecum, but unable to intubate behind IC valve/fold.       Overall has a good look at the cecum. Estimated blood loss: none.      Multiple small-mouthed diverticula were found in the entire colon.       Estimated blood loss: none.      Non-bleeding internal hemorrhoids were found during retroflexion. The       hemorrhoids were Grade I (internal hemorrhoids that do not prolapse).       Estimated blood loss: none.      The exam was otherwise without abnormality on direct and retroflexion       views. Impression:            - Diverticulosis in the entire examined colon.                        - Non-bleeding internal hemorrhoids.                        - The examination was otherwise normal on direct and                         retroflexion views.                        - No specimens  collected. Recommendation:        - Patient has a contact number available for                         emergencies. The signs and symptoms of potential                         delayed complications were discussed with the patient.                         Return to normal activities tomorrow. Written                         discharge instructions were provided to the patient.                        - Discharge patient to home.                        - Resume previous diet.                        - Continue present medications.                        - Likely will need no further surveillance given age  and comorbidities as she has only had two tubular                         adenomas life time (2011) and 2016 colonoscopy and                         today without polyps. No family history of colon                         polyps or colon cancer.                        - Return to GI clinic as previously scheduled.                        - The findings and recommendations were discussed with                         the patient's family.                        - The findings and recommendations were discussed with                         the patient. Procedure Code(s):     --- Professional ---                        [phone], Colonoscopy, flexible; diagnostic, including                         collection of specimen(s) by brushing or washing, when                         performed (separate procedure) Diagnosis Code(s):     --- Professional ---                        K64.0, First degree hemorrhoids                        D50.9, Iron  deficiency anemia, unspecified                        K57.30, Diverticulosis of large intestine without                         perforation or abscess without bleeding CPT copyright 2022 [employer]. All rights reserved. The codes documented in this report are preliminary and upon coder review may  be revised to meet current  compliance requirements. Attending Participation:      I personally performed the entire procedure. Elspeth Jungling, DO Elspeth Ozell Jungling DO, DO 03/17/2023 2:25:00 PM This report has been signed electronically. Number of Addenda: 0 Note Initiated On: 03/17/2023 1:50 PM Scope Withdrawal Time: 0 hours 10 minutes 30 seconds  Total Procedure Duration: 0 hours 19 minutes 25 seconds  Estimated Blood Loss:  Estimated blood loss: none.      [facility]

## 2023-03-19 ENCOUNTER — Other Ambulatory Visit: Payer: Medicare PPO

## 2023-03-19 ENCOUNTER — Ambulatory Visit: Payer: Medicare PPO | Admitting: Oncology

## 2023-03-19 ENCOUNTER — Encounter: Payer: Self-pay | Admitting: Gastroenterology

## 2023-03-19 ENCOUNTER — Ambulatory Visit: Payer: Medicare PPO

## 2023-03-22 LAB — SURGICAL PATHOLOGY

## 2023-03-23 ENCOUNTER — Telehealth: Payer: Self-pay | Admitting: Oncology

## 2023-03-23 ENCOUNTER — Encounter: Payer: Self-pay | Admitting: Oncology

## 2023-03-23 NOTE — Telephone Encounter (Signed)
 I had to change appt times on 1/13 due to infusion schedule and moving another chemo per Connye Burkitt. I spoke with pt and let her know the updated times and she was okay with this.

## 2023-03-24 NOTE — Anesthesia Postprocedure Evaluation (Signed)
 Anesthesia Post Note  Patient: Valerie Case  Procedure(s) Performed: COLONOSCOPY ESOPHAGOGASTRODUODENOSCOPY (EGD) BIOPSY  Patient location during evaluation: PACU Anesthesia Type: General Level of consciousness: awake and alert Pain management: pain level controlled Vital Signs Assessment: post-procedure vital signs reviewed and stable Respiratory status: spontaneous breathing, nonlabored ventilation, respiratory function stable and patient connected to nasal cannula oxygen Cardiovascular status: blood pressure returned to baseline and stable Postop Assessment: no apparent nausea or vomiting Anesthetic complications: no   No notable events documented.   Last Vitals:  Vitals:   03/17/23 1428 03/17/23 1439  BP: 128/82 (!) 150/79  Pulse: 98 89  Resp:    Temp:    SpO2: 98% 100%    Last Pain:  Vitals:   03/17/23 1439  TempSrc:   PainSc: 0-No pain                 Lynwood KANDICE Clause

## 2023-03-27 ENCOUNTER — Other Ambulatory Visit: Payer: Self-pay | Admitting: Oncology

## 2023-03-27 ENCOUNTER — Telehealth: Payer: Self-pay | Admitting: Oncology

## 2023-03-27 DIAGNOSIS — C541 Malignant neoplasm of endometrium: Secondary | ICD-10-CM

## 2023-03-27 NOTE — Telephone Encounter (Signed)
 Pt called to r/s appts on 03/30/23 pt stated she does not want to risk the weather/driving/falling. Pt stated she can come in on Friday 04/03/23.   Dr.Yu can you update the date on the IS orders?  Thank you

## 2023-03-30 ENCOUNTER — Ambulatory Visit: Payer: Medicare PPO | Admitting: Oncology

## 2023-03-30 ENCOUNTER — Ambulatory Visit: Payer: Medicare PPO

## 2023-03-30 ENCOUNTER — Inpatient Hospital Stay: Payer: Medicare PPO

## 2023-03-30 ENCOUNTER — Other Ambulatory Visit: Payer: Medicare PPO

## 2023-03-30 ENCOUNTER — Inpatient Hospital Stay: Payer: Medicare PPO | Admitting: Oncology

## 2023-04-03 ENCOUNTER — Inpatient Hospital Stay: Payer: Medicare PPO

## 2023-04-03 ENCOUNTER — Inpatient Hospital Stay: Payer: Medicare PPO | Attending: Obstetrics and Gynecology | Admitting: Oncology

## 2023-04-03 ENCOUNTER — Inpatient Hospital Stay: Payer: Medicare PPO | Attending: Obstetrics and Gynecology

## 2023-04-03 ENCOUNTER — Encounter: Payer: Self-pay | Admitting: Oncology

## 2023-04-03 VITALS — BP 117/72 | HR 99 | Temp 98.2°F | Resp 18 | Wt 279.1 lb

## 2023-04-03 DIAGNOSIS — I700 Atherosclerosis of aorta: Secondary | ICD-10-CM | POA: Insufficient documentation

## 2023-04-03 DIAGNOSIS — J45909 Unspecified asthma, uncomplicated: Secondary | ICD-10-CM | POA: Insufficient documentation

## 2023-04-03 DIAGNOSIS — M2550 Pain in unspecified joint: Secondary | ICD-10-CM | POA: Diagnosis not present

## 2023-04-03 DIAGNOSIS — N189 Chronic kidney disease, unspecified: Secondary | ICD-10-CM | POA: Diagnosis not present

## 2023-04-03 DIAGNOSIS — K5730 Diverticulosis of large intestine without perforation or abscess without bleeding: Secondary | ICD-10-CM | POA: Insufficient documentation

## 2023-04-03 DIAGNOSIS — D631 Anemia in chronic kidney disease: Secondary | ICD-10-CM

## 2023-04-03 DIAGNOSIS — E041 Nontoxic single thyroid nodule: Secondary | ICD-10-CM | POA: Diagnosis not present

## 2023-04-03 DIAGNOSIS — Z923 Personal history of irradiation: Secondary | ICD-10-CM | POA: Diagnosis not present

## 2023-04-03 DIAGNOSIS — K760 Fatty (change of) liver, not elsewhere classified: Secondary | ICD-10-CM | POA: Insufficient documentation

## 2023-04-03 DIAGNOSIS — Z9089 Acquired absence of other organs: Secondary | ICD-10-CM | POA: Insufficient documentation

## 2023-04-03 DIAGNOSIS — Z8719 Personal history of other diseases of the digestive system: Secondary | ICD-10-CM | POA: Diagnosis not present

## 2023-04-03 DIAGNOSIS — Z801 Family history of malignant neoplasm of trachea, bronchus and lung: Secondary | ICD-10-CM | POA: Insufficient documentation

## 2023-04-03 DIAGNOSIS — Z886 Allergy status to analgesic agent status: Secondary | ICD-10-CM | POA: Diagnosis not present

## 2023-04-03 DIAGNOSIS — Z5112 Encounter for antineoplastic immunotherapy: Secondary | ICD-10-CM | POA: Insufficient documentation

## 2023-04-03 DIAGNOSIS — C541 Malignant neoplasm of endometrium: Secondary | ICD-10-CM

## 2023-04-03 DIAGNOSIS — G629 Polyneuropathy, unspecified: Secondary | ICD-10-CM | POA: Insufficient documentation

## 2023-04-03 DIAGNOSIS — Z881 Allergy status to other antibiotic agents status: Secondary | ICD-10-CM | POA: Diagnosis not present

## 2023-04-03 DIAGNOSIS — R5383 Other fatigue: Secondary | ICD-10-CM | POA: Insufficient documentation

## 2023-04-03 DIAGNOSIS — Z9049 Acquired absence of other specified parts of digestive tract: Secondary | ICD-10-CM | POA: Insufficient documentation

## 2023-04-03 DIAGNOSIS — E1122 Type 2 diabetes mellitus with diabetic chronic kidney disease: Secondary | ICD-10-CM | POA: Insufficient documentation

## 2023-04-03 DIAGNOSIS — Z79620 Long term (current) use of immunosuppressive biologic: Secondary | ICD-10-CM | POA: Insufficient documentation

## 2023-04-03 DIAGNOSIS — R160 Hepatomegaly, not elsewhere classified: Secondary | ICD-10-CM | POA: Insufficient documentation

## 2023-04-03 DIAGNOSIS — Z79899 Other long term (current) drug therapy: Secondary | ICD-10-CM | POA: Diagnosis not present

## 2023-04-03 DIAGNOSIS — Z86018 Personal history of other benign neoplasm: Secondary | ICD-10-CM | POA: Insufficient documentation

## 2023-04-03 DIAGNOSIS — I129 Hypertensive chronic kidney disease with stage 1 through stage 4 chronic kidney disease, or unspecified chronic kidney disease: Secondary | ICD-10-CM | POA: Diagnosis not present

## 2023-04-03 DIAGNOSIS — E042 Nontoxic multinodular goiter: Secondary | ICD-10-CM | POA: Insufficient documentation

## 2023-04-03 DIAGNOSIS — I2510 Atherosclerotic heart disease of native coronary artery without angina pectoris: Secondary | ICD-10-CM | POA: Insufficient documentation

## 2023-04-03 DIAGNOSIS — Z809 Family history of malignant neoplasm, unspecified: Secondary | ICD-10-CM | POA: Insufficient documentation

## 2023-04-03 DIAGNOSIS — D649 Anemia, unspecified: Secondary | ICD-10-CM | POA: Diagnosis not present

## 2023-04-03 DIAGNOSIS — Z803 Family history of malignant neoplasm of breast: Secondary | ICD-10-CM | POA: Insufficient documentation

## 2023-04-03 LAB — CBC WITH DIFFERENTIAL/PLATELET
Abs Immature Granulocytes: 0.13 10*3/uL — ABNORMAL HIGH (ref 0.00–0.07)
Basophils Absolute: 0 10*3/uL (ref 0.0–0.1)
Basophils Relative: 1 %
Eosinophils Absolute: 0.3 10*3/uL (ref 0.0–0.5)
Eosinophils Relative: 4 %
HCT: 36.8 % (ref 36.0–46.0)
Hemoglobin: 11.7 g/dL — ABNORMAL LOW (ref 12.0–15.0)
Immature Granulocytes: 2 %
Lymphocytes Relative: 18 %
Lymphs Abs: 1.3 10*3/uL (ref 0.7–4.0)
MCH: 30.5 pg (ref 26.0–34.0)
MCHC: 31.8 g/dL (ref 30.0–36.0)
MCV: 95.8 fL (ref 80.0–100.0)
Monocytes Absolute: 0.6 10*3/uL (ref 0.1–1.0)
Monocytes Relative: 9 %
Neutro Abs: 4.7 10*3/uL (ref 1.7–7.7)
Neutrophils Relative %: 66 %
Platelets: 294 10*3/uL (ref 150–400)
RBC: 3.84 MIL/uL — ABNORMAL LOW (ref 3.87–5.11)
RDW: 13.3 % (ref 11.5–15.5)
WBC: 7 10*3/uL (ref 4.0–10.5)
nRBC: 0 % (ref 0.0–0.2)

## 2023-04-03 LAB — COMPREHENSIVE METABOLIC PANEL
ALT: 20 U/L (ref 0–44)
AST: 23 U/L (ref 15–41)
Albumin: 3.9 g/dL (ref 3.5–5.0)
Alkaline Phosphatase: 58 U/L (ref 38–126)
Anion gap: 10 (ref 5–15)
BUN: 39 mg/dL — ABNORMAL HIGH (ref 8–23)
CO2: 19 mmol/L — ABNORMAL LOW (ref 22–32)
Calcium: 9.7 mg/dL (ref 8.9–10.3)
Chloride: 106 mmol/L (ref 98–111)
Creatinine, Ser: 1.06 mg/dL — ABNORMAL HIGH (ref 0.44–1.00)
GFR, Estimated: 56 mL/min — ABNORMAL LOW (ref >60)
Glucose, Bld: 147 mg/dL — ABNORMAL HIGH (ref 70–99)
Potassium: 4.2 mmol/L (ref 3.5–5.1)
Sodium: 135 mmol/L (ref 135–145)
Total Bilirubin: 0.4 mg/dL (ref 0.0–1.2)
Total Protein: 7.3 g/dL (ref 6.5–8.1)

## 2023-04-03 LAB — TSH: TSH: 1.862 u[IU]/mL (ref 0.350–4.500)

## 2023-04-03 MED ORDER — HEPARIN SOD (PORK) LOCK FLUSH 100 UNIT/ML IV SOLN
500.0000 [IU] | Freq: Once | INTRAVENOUS | Status: AC | PRN
  Administered 2023-04-03: 500 [IU]
  Filled 2023-04-03: qty 5

## 2023-04-03 MED ORDER — SODIUM CHLORIDE 0.9 % IV SOLN
Freq: Once | INTRAVENOUS | Status: AC
Start: 2023-04-03 — End: 2023-04-03
  Filled 2023-04-03: qty 250

## 2023-04-03 MED ORDER — SODIUM CHLORIDE 0.9 % IV SOLN
200.0000 mg | Freq: Once | INTRAVENOUS | Status: AC
  Administered 2023-04-03: 200 mg via INTRAVENOUS
  Filled 2023-04-03: qty 200

## 2023-04-03 NOTE — Progress Notes (Signed)
Hematology/Oncology Progress note [phone] [phone]      CHIEF COMPLAINTS/REASON FOR VISIT:  Follow up for endometrial cancer  ASSESSMENT & PLAN:   Cancer Staging  Endometrial adenocarcinoma [facility]) Staging form: Corpus Uteri - Carcinoma and Carcinosarcoma, AJCC 8th Edition - Clinical stage from 04/10/2021: FIGO Stage III, calculated as Stage Unknown (cT3, cNX, cM0) - Signed by Rickard Patience, MD on 08/24/2021   Endometrial adenocarcinoma (HCC) FIGO grade 3 poorly differentiated endometrial adenocarcinoma. s/p carboplatin and Taxol for 5 cycles, on Keytruda maintenance.  Labs are reviewed and discussed with patient.  Proceed with Keytruda Repeat CT prior to next visit.    Thyroid nodule Thyroid nodule biopsy- benign Recommend patient to follow up with endocrinology for management.   Neuropathy Continue  gabapentin 100mg  daily  Anemia in chronic kidney disease (CKD) Stable hemoglobin   Orders Placed This Encounter  Procedures   CT CHEST ABDOMEN PELVIS WO CONTRAST    Standing Status:   Future    Expected Date:   04/17/2023    Expiration Date:   04/02/2024    Preferred imaging location?:   [facility]    If indicated for the ordered procedure, I authorize the administration of oral contrast media per Radiology protocol:   Yes    Does the patient have a contrast media/X-ray dye allergy?:   No   CBC with Differential    Standing Status:   Future    Expected Date:   05/15/2023    Expiration Date:   05/14/2024   Comprehensive metabolic panel    Standing Status:   Future    Expected Date:   05/15/2023    Expiration Date:   05/14/2024   TSH    Standing Status:   Future    Expected Date:   05/15/2023    Expiration Date:   05/14/2024    Return of visit:  3 weeks Lab MD Greggory Brandy, MD, [name] [facility] Hematology Oncology 04/03/2023     HISTORY OF PRESENTING ILLNESS:   Valerie Case is a  73 y.o.  female presents for follow up of   FIGO grade 3 poorly differentiated endometrial adenocarcinoma. Oncology History  Endometrial adenocarcinoma (HCC)  12/12/2020 Imaging   12/12/2020, CT abdomen pelvis with contrast showed no radiographic evidence of urinary tract neoplasm, calculi, hydronephrosis.  2.6 cm nonspecific left adrenal mass. 02/12/2021 CT hematuria work-up showed small uterine fibroids, no findings to explain hematuria.Hepatomegaly, diverticulosis without evidence of diverticulitis, Coronary artery disease   04/01/2021 -  Hospital Admission   04/01/2021 - 04/03/2021, patient was hospitalized due to symptomatic anemia, hemoglobin 6.9, heavy postmenopausal bleeding with passing large clots.  She also presented with increased creatinine level to 2.58 compared to her baseline level of 0.9 in November 2022.  Patient received IV iron infusion and 2 units of PRBC during the hospital stay..  04/02/2021, iron panel showed iron saturation 37, ferritin 37, TIBC 333-the studies were done after patient received blood transfusion.  At discharge, hemoglobin was 7.7.   04/01/2021 Initial Diagnosis   04/01/2020, endometrial biopsy showed poorly differentiated endometrial adenocarcinoma. Omniseq NGS showed TMB 48.4 mt/mb [high], MSI High, PD-L1-TPS <1%, PIK3CA H1047Q, Negative for BRAF, HER2, NTRK1 RET.     04/10/2021 Cancer Staging   Staging form: Corpus Uteri - Carcinoma and Carcinosarcoma, AJCC 8th Edition - Clinical stage from 04/10/2021: FIGO Stage III, calculated as Stage Unknown (cT3, cNX, cM0) - Signed by Rickard Patience, MD on 08/24/2021 Stage prefix: Initial diagnosis  04/15/2021 Imaging   PET showed 1. Large hypermetabolic endometrial mass consistent with known endometrial cancer. Suspect direct invasion/involvement of the right adnexa. Right pelvic sidewall hypermetabolic adenopathy.2. 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 3. No findings for  metastatic disease involving the chest or bonystructures.     04/24/2021 - 05/29/2021 Radiation Therapy   status post radiation to pelvis   08/05/2021 Imaging   MRI abdomen w wo contrast 1. Stable solid enhancing 2.7 cm left adrenal lesion, which was hypermetabolic on prior PET/CTs but is unchanged in size dating back to December 12, 2020. 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 However, unfortunately metastatic disease can not be entirely excluded and remains a pertinent differential consideration. Comparison with more remote prior imaging would be the most valuable tool in the assessment of this lesion, as demonstrating long-term stability would indicate this to be a benign lesion. However, if no prior imaging can be made available, would consider follow-up adrenal protocol CT with and without intravenous contrast material in 3 months as this would allow for further assessment of stability as well as enhancement and washout characteristics of the lesion potentially allowing for better characterization versus direct tissue sampling.2. Hepatomegaly and hepatic steatosis.3. Colonic diverticulosis without findings of acute diverticulitis    08/16/2021 - 09/27/2021 Chemotherapy   UTERINE Carboplatin AUC 5 / Paclitaxel q21d x 3      10/03/2021 Imaging   PET 1. No residual hypermetabolism in the uterus suggesting an excellent response to treatment. No findings for metastatic disease. 2. Resolution of hypermetabolism in the left adrenal gland lesion suggesting this was metastatic disease. 3. Diffuse marrow hypermetabolism likely due to rebound from chemotherapy or marrow stimulating drugs   10/18/2021 - 11/08/2021 Chemotherapy   AUC 5 / Paclitaxel/ Keytruda Q21d  x 2 cycles   11/29/2021 -  Chemotherapy   Patient is on Treatment Plan :  UTERINE Pembrolizumab (200) q21d     04/30/2022 Imaging   CT chest abdomen pelvis w contrast  1. Similar to minimal decrease in size of a left adrenal nodule which was felt suspicious for metastatic disease on 10/03/2021 PET. 2. No evidence of new or progressive disease. 3. Coronary artery atherosclerosis. Aortic Atherosclerosis (ICD10-I70.0). 4. Incidental findings, including: Hepatomegaly. Possible constipation.   08/28/2022 Imaging   CT chest abdomen pelvis w contrast showed 1. Similar appearance of indeterminate left adrenal nodule. 2. Otherwise, no evidence of metastatic disease. 3. Hepatomegaly 4.  Possible constipation. 5. Coronary artery atherosclerosis. Aortic Atherosclerosis (ICD10-I70.0).   12/22/2022 Imaging   CT chest abdomen pelvis w contrast showed 1. Stable indeterminate left adrenal nodule. 2. Otherwise, no noncontrast enhanced CT evidence of metastatic disease. 3. Colonic diverticulosis without findings of acute diverticulitis. 4.  Aortic Atherosclerosis (ICD10-I70.0).         INTERVAL HISTORY BOBBETTE UFFORD is a 73 y.o. female who has above history reviewed by me today presents for follow up visit for anemia and endometrial cancer Overall she tolerates treatment Patient denies nausea vomiting diarrhea.   Review of Systems  Constitutional:  Positive for fatigue. Negative for chills and fever.  HENT:   Negative for hearing loss and voice change.   Eyes:  Negative for eye problems.  Respiratory:  Negative for chest tightness and cough.   Cardiovascular:  Negative for  chest pain.  Gastrointestinal:  Negative for abdominal distention, abdominal pain and blood in stool.  Endocrine: Negative for hot flashes.  Genitourinary:  Negative for difficulty urinating, frequency and vaginal bleeding.   Musculoskeletal:  Positive for arthralgias.  Skin:  Negative for itching and rash.  Neurological:  Negative for extremity weakness and headaches.  Hematological:   Negative for adenopathy.  Psychiatric/Behavioral:  Negative for confusion.     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   Mixed hyperlipidemia    Peripheral polyneuropathy    Sleep apnea     SURGICAL HISTORY: Past Surgical History:  Procedure Laterality Date   APPENDECTOMY     BIOPSY  03/17/2023   Procedure: BIOPSY;  Surgeon: Jaynie Collins, DO;  Location: [facility] ENDOSCOPY;  Service: Gastroenterology;;   CESAREAN SECTION  01/23/1979   COLONOSCOPY     COLONOSCOPY N/A 03/17/2023   Procedure: COLONOSCOPY;  Surgeon: Jaynie Collins, DO;  Location: [facility] ENDOSCOPY;  Service: Gastroenterology;  Laterality: N/A;   COLONOSCOPY WITH PROPOFOL N/A 03/13/2023   Procedure: COLONOSCOPY WITH PROPOFOL;  Surgeon: Jaynie Collins, DO;  Location: [facility] ENDOSCOPY;  Service: Gastroenterology;  Laterality: N/A;   ESOPHAGOGASTRODUODENOSCOPY     ESOPHAGOGASTRODUODENOSCOPY N/A 03/17/2023   Procedure: ESOPHAGOGASTRODUODENOSCOPY (EGD);  Surgeon: Jaynie Collins, DO;  Location: [facility] ENDOSCOPY;  Service: Gastroenterology;  Laterality: N/A;   ESOPHAGOGASTRODUODENOSCOPY (EGD) WITH PROPOFOL N/A 03/13/2023   Procedure: ESOPHAGOGASTRODUODENOSCOPY (EGD) WITH PROPOFOL;  Surgeon: Jaynie Collins, DO;  Location: [facility] ENDOSCOPY;  Service: Gastroenterology;  Laterality: N/A;   IR IMAGING GUIDED PORT INSERTION  08/09/2021   TONSILLECTOMY  03/17/1955    SOCIAL HISTORY: Social History   Socioeconomic History   Marital  status: Divorced    Spouse name: Not on file   Number of children: Not on file   Years of education: Not on file   Highest education level: Not on file  Occupational History   Not on file  Tobacco Use   Smoking status: Never   Smokeless tobacco: Never  Vaping Use   Vaping status: Never Used  Substance and Sexual Activity   Alcohol use: Not Currently   Drug use: Never   Sexual activity: Not Currently  Other Topics Concern   Not on file  Social History Narrative      Social Drivers of Health   Financial Resource Strain: Low Risk  (01/15/2023)   Received from [facility]   Overall Financial Resource Strain (CARDIA)    Difficulty of Paying Living Expenses: Not hard at all  Food Insecurity: No Food Insecurity (01/15/2023)   Received from [facility]   Hunger Vital Sign    Worried About Running Out of Food in the Last Year: Never true    Ran Out of Food in the Last Year: Never true  Transportation Needs: No Transportation Needs (01/15/2023)   Received from [facility]   PRAPARE -  Transportation    In the past 12 months, has lack of transportation kept you from medical appointments or from getting medications?: No    Lack of Transportation (Non-Medical): No  Physical Activity: Inactive (09/27/2021)   Exercise Vital Sign    Days of Exercise per Week: 0 days    Minutes of Exercise per Session: 0 min  Stress: Stress Concern Present (09/27/2021)   [employer] of Occupational Health - Occupational Stress Questionnaire    Feeling of Stress : Rather much  Social Connections: Moderately Integrated (09/27/2021)   Social Connection and Isolation Panel [NHANES]    Frequency of Communication with Friends and Family: Three times a week    Frequency of Social Gatherings with Friends and Family: Three times a week    Attends Religious Services: 1 to 4 times per year    Active Member of Clubs or Organizations: No    Attends Tax inspector Meetings: 1 to 4 times per year    Marital Status: Divorced  Intimate Partner Violence: Not At Risk (09/27/2021)   Humiliation, Afraid, Rape, and Kick questionnaire    Fear of Current or Ex-Partner: No    Emotionally Abused: No    Physically Abused: No    Sexually Abused: No    FAMILY HISTORY: Family History  Problem Relation Age of Onset   Breast cancer Mother 91   Cancer Mother    Cancer Father    Lung cancer Father    Breast cancer Cousin        dx 28s maternal    ALLERGIES:  is allergic to doxycycline and aspirin.  MEDICATIONS:  Current Outpatient Medications  Medication Sig Dispense Refill   acetaminophen (TYLENOL) 650 MG CR tablet Take 1,300 mg by mouth every 8 (eight) hours as needed for pain.     albuterol (VENTOLIN HFA) 108 (90 Base) MCG/ACT inhaler Inhale 2 puffs into the lungs every 6 (six) hours as needed for wheezing or shortness of breath.     azelastine (ASTELIN) 0.1 % nasal spray Place 2 sprays into both nostrils daily. Use in each nostril as directed 30 mL 0   Cranberry-Vitamin C-Probiotic (AZO CRANBERRY PO) Take by mouth.     docusate sodium (COLACE) 100 MG capsule Take 1 capsule (100 mg total) by mouth 2 (two) times daily. 60 capsule 1   empagliflozin (JARDIANCE) 25 MG TABS tablet Take 25 mg by mouth daily.     ferrous sulfate 325 (65 FE) MG EC tablet Take 325 mg by mouth daily with breakfast.     FLUoxetine (PROZAC) 10 MG capsule Take 10 mg by mouth daily.     fluticasone (FLONASE) 50 MCG/ACT nasal spray Place 1 spray into both nostrils daily as needed for allergies or rhinitis.     furosemide (LASIX) 20 MG tablet Take 20 mg by mouth daily.     gabapentin (NEURONTIN) 100 MG capsule Take 1 capsule (100 mg total) by mouth daily. 90 capsule 1   hydrochlorothiazide (HYDRODIURIL) 12.5 MG tablet Take 12.5 mg by mouth daily.     hydrocortisone 2.5 % cream Apply topically 3 (three) times daily. 30 g 2   ibuprofen (ADVIL) 200 MG tablet Take 400 mg by  mouth every 8 (eight) hours as needed for moderate pain.     Insulin Asp Prot & Asp FlexPen (NOVOLOG 70/30 MIX) (70-30) 100 UNIT/ML FlexPen Inject into the skin.     lidocaine-prilocaine (EMLA) cream Apply to affected area once 30 g 3   lisinopril (ZESTRIL)  40 MG tablet Take 40 mg by mouth at bedtime.     loratadine (CLARITIN) 10 MG tablet Take by mouth.     LORazepam (ATIVAN) 0.5 MG tablet Take 1 tablet (0.5 mg total) by mouth every 8 (eight) hours as needed for anxiety (nausea vomiting). 30 tablet 0   metFORMIN (GLUCOPHAGE-XR) 500 MG 24 hr tablet Take 1,000 mg by mouth at bedtime.     metoprolol succinate (TOPROL-XL) 25 MG 24 hr tablet Take 25 mg by mouth daily.     mupirocin ointment (BACTROBAN) 2 % Apply 1 Application topically 3 (three) times daily. 22 g 0   NOVOLIN 70/30 FLEXPEN (70-30) 100 UNIT/ML KwikPen 50-110 Units See admin instructions. 50 units in the morning, 110 units at bedtime     omeprazole (PRILOSEC) 20 MG capsule Take 20 mg by mouth daily.     OZEMPIC, 1 MG/DOSE, 4 MG/3ML SOPN Inject 1 mg into the skin every Tuesday.     potassium chloride SA (KLOR-CON M) 20 MEQ tablet Take 1 tablet (20 mEq total) by mouth daily. 3 tablet 0   rosuvastatin (CRESTOR) 10 MG tablet Take 10 mg by mouth daily.     traMADol (ULTRAM) 50 MG tablet Take 2 tablets (100 mg total) by mouth every 6 (six) hours as needed. 90 tablet 0   fluconazole (DIFLUCAN) 150 MG tablet Take 1 tablet (150 mg total) by mouth daily. Take 1 tablet day one and repeat 2 days later. (Patient not taking: Reported on 04/03/2023) 2 tablet 0   No current facility-administered medications for this visit.   Facility-Administered Medications Ordered in Other Visits  Medication Dose Route Frequency Provider Last Rate Last Admin   heparin lock flush 100 UNIT/ML injection              PHYSICAL EXAMINATION: ECOG PERFORMANCE STATUS: 1 - Symptomatic but completely ambulatory  Physical Exam Constitutional:      General: She is not in  acute distress.    Appearance: She is obese.  HENT:     Head: Normocephalic and atraumatic.  Eyes:     General: No scleral icterus. Cardiovascular:     Rate and Rhythm: Regular rhythm. Tachycardia present.     Heart sounds: Murmur heard.  Pulmonary:     Effort: Pulmonary effort is normal. No respiratory distress.  Abdominal:     General: Bowel sounds are normal. There is no distension.     Palpations: Abdomen is soft.  Musculoskeletal:        General: No deformity. Normal range of motion.     Cervical back: Normal range of motion.     Right lower leg: Edema present.     Left lower leg: Edema present.     Comments:    Skin:    General: Skin is warm and dry.     Findings: No rash.     Comments: Chronic venous sufficiency skin changes  Neurological:     Mental Status: She is alert and oriented to person, place, and time. Mental status is at baseline.     Cranial Nerves: No cranial nerve deficit.     Coordination: Coordination normal.  Psychiatric:        Mood and Affect: Mood normal.    LABORATORY DATA:  I have reviewed the data as listed    Latest Ref Rng & Units 04/03/2023    9:06 AM 03/09/2023   12:55 PM 02/16/2023   10:08 AM  CBC  WBC 4.0 - 10.5 K/uL 7.0  7.3  6.2   Hemoglobin 12.0 - 15.0 g/dL 64.3  32.9  51.8   Hematocrit 36.0 - 46.0 % 36.8  35.7  35.6   Platelets 150 - 400 K/uL 294  281  271       Latest Ref Rng & Units 04/03/2023    9:06 AM 03/09/2023   12:55 PM 02/16/2023   10:08 AM  CMP  Glucose 70 - 99 mg/dL 841  660  630   BUN 8 - 23 mg/dL 39  29  37   Creatinine 0.44 - 1.00 mg/dL 1.60  1.09  3.23   Sodium 135 - 145 mmol/L 135  137  141   Potassium 3.5 - 5.1 mmol/L 4.2  4.1  4.0   Chloride 98 - 111 mmol/L 106  105  109   CO2 22 - 32 mmol/L 19  21  20    Calcium 8.9 - 10.3 mg/dL 9.7  55.7  9.1   Total Protein 6.5 - 8.1 g/dL 7.3  7.3  7.0   Total Bilirubin 0.0 - 1.2 mg/dL 0.4  0.4  0.3   Alkaline Phos 38 - 126 U/L 58  48  47   AST 15 - 41 U/L 23  22  21     ALT 0 - 44 U/L 20  19  20        RADIOGRAPHIC STUDIES: I have personally reviewed the radiological images as listed and agreed with the findings in the report. [place] FNA BX THYROID 1ST LESION AFIRMA Result Date: 02/19/2023 INDICATION: Indeterminate thyroid nodule meeting ACR TI-RADS criteria for biopsy. PROCEDURE: ULTRASOUND GUIDED FINE NEEDLE ASPIRATION OF INDETERMINATE THYROID NODULE COMPARISON:  Ultrasound thyroid 02/07/2023. MEDICATIONS: Local 1% lidocaine only. ANESTHESIA/SEDATION: Local 1% lidocaine only. COMPLICATIONS: None immediate. Post procedure, the patient was given an ice pack which was applied and discharge instructions which the patient verbally voiced understanding. The patient denied any post procedure site pain, bruising and denied any other procedure related complaints. All of the patient's questions were answered prior to discharge. TECHNIQUE: Informed written consent was obtained from the patient after a discussion of the risks, benefits, non-diagnostic sampling and reflex molecular testing with ThyroSeq and alternatives to treatment. Questions regarding the procedure were encouraged and answered. A timeout was performed prior to the initiation of the procedure. Pre-procedural ultrasound scanning demonstrated unchanged size and appearance of the indeterminate nodule within the left thyroid lobe. The procedure was planned. The neck was prepped in the usual sterile fashion, and a sterile drape was applied covering the operative field. A timeout was performed prior to the initiation of the procedure. Local anesthesia was provided with 1% lidocaine. Under direct ultrasound guidance, 5 FNA biopsies were performed of the left lower thyroid nodule with a 25 gauge needle. Multiple ultrasound images were saved for procedural documentation purposes. The samples were prepared and submitted to pathology. One of the passes was taken for ThyroSeq molecular testing if indicated. Limited post procedural  scanning was negative for hematoma or additional complication. Dressings were placed. The patient tolerated the above procedures procedure well without immediate postprocedural complication. FINDINGS: Nodule reference number based on prior diagnostic ultrasound: 5 Maximum size: 3.2 cm Location: Left; lower ACR TI-RADS risk category:  TR3 (3 points) Prior biopsy:  No Reason for biopsy: meets ACR TI-RADS criteria Ultrasound imaging confirms appropriate placement of the needles within the thyroid nodule. IMPRESSION: Technically successful ultrasound guided fine needle aspiration biopsy of the left lower thyroid nodule. This exam was performed by Pattricia Boss PA-C, and  was supervised by Dr. and interpreted by Dr. Fredia Sorrow. Electronically Signed   By: Irish Lack M.D.   On: 02/19/2023 15:55   US THYROID Result Date: 02/07/2023 CLINICAL DATA:  Goiter. EXAM: THYROID ULTRASOUND TECHNIQUE: Ultrasound examination of the thyroid gland and adjacent soft tissues was performed. COMPARISON:  None Available. FINDINGS: Parenchymal Echotexture: Moderately heterogenous Isthmus: 0.2 cm Right lobe: 4.4 x 2.5 x 2.2 cm Left lobe: 4.6 x 2.9 x 2.8 cm _________________________________________________________ Estimated total number of nodules >/= 1 cm: 2 Number of spongiform nodules >/=  2 cm not described below (TR1): 0 Number of mixed cystic and solid nodules >/= 1.5 cm not described below (TR2): 0 _________________________________________________________ Diffusely enlarged, heterogeneous and goitrous thyroid gland. Nodule # 1, # 2 and # 3 are subcentimeter nodules scattered throughout the right gland. No further follow-up is recommended. Nodule # 4: Cluster of smaller isoechoic solid nodules in the right lower gland. Measured independently, no individual nodule measures larger than 1 cm. No further follow-up. Nodule # 5: Isoechoic ill-defined solid nodule in the left lower gland measures approximately 3.2 x 2.5 x 1.8 cm. Findings  are consistent with TI-RADS category 3. **Given size (>/= 2.5 cm) and appearance, fine needle aspiration of this mildly suspicious nodule should be considered based on TI-RADS criteria. IMPRESSION: 1. Heterogeneous enlarged goitrous thyroid gland. 2. Nodule # 5 is a 3.2 cm TI-RADS category 3 nodule in the left lower gland meets criteria to consider fine-needle aspiration biopsy. Biopsy is recommended. The above is in keeping with the ACR TI-RADS recommendations - J Am Coll Radiol 2017;14:587-595. Electronically Signed   By: Malachy Moan M.D.   On: 02/07/2023 14:38

## 2023-04-03 NOTE — Assessment & Plan Note (Signed)
Continue gabapentin 100mg  daily.

## 2023-04-03 NOTE — Assessment & Plan Note (Signed)
Stable hemoglobin 

## 2023-04-03 NOTE — Assessment & Plan Note (Signed)
Thyroid nodule biopsy- benign Recommend patient to follow up with endocrinology for management.

## 2023-04-03 NOTE — Assessment & Plan Note (Addendum)
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 s/p carboplatin and Taxol for 5 cycles, on Keytruda maintenance.  Labs are reviewed and discussed with patient.  Proceed with Keytruda Repeat CT prior to next visit.

## 2023-04-03 NOTE — Progress Notes (Signed)
Pt here for follow up. Pt reports that she had a recent colonoscopy.

## 2023-04-17 ENCOUNTER — Ambulatory Visit
Admission: RE | Admit: 2023-04-17 | Discharge: 2023-04-17 | Disposition: A | Discharge status: Home / Self Care | Payer: Medicare PPO | Source: Ambulatory Visit | Attending: Oncology | Admitting: Oncology

## 2023-04-17 DIAGNOSIS — C541 Malignant neoplasm of endometrium: Secondary | ICD-10-CM | POA: Diagnosis present

## 2023-04-24 ENCOUNTER — Inpatient Hospital Stay: Payer: Medicare PPO

## 2023-04-24 ENCOUNTER — Encounter: Payer: Self-pay | Admitting: Nurse Practitioner

## 2023-04-24 ENCOUNTER — Inpatient Hospital Stay: Payer: Medicare PPO | Attending: Obstetrics and Gynecology | Admitting: Nurse Practitioner

## 2023-04-24 ENCOUNTER — Inpatient Hospital Stay: Payer: Medicare PPO | Attending: Obstetrics and Gynecology

## 2023-04-24 VITALS — BP 137/78 | HR 96

## 2023-04-24 VITALS — BP 131/63 | HR 102 | Temp 98.5°F | Resp 18 | Wt 280.9 lb

## 2023-04-24 DIAGNOSIS — E041 Nontoxic single thyroid nodule: Secondary | ICD-10-CM | POA: Insufficient documentation

## 2023-04-24 DIAGNOSIS — Z803 Family history of malignant neoplasm of breast: Secondary | ICD-10-CM | POA: Insufficient documentation

## 2023-04-24 DIAGNOSIS — M2550 Pain in unspecified joint: Secondary | ICD-10-CM | POA: Diagnosis not present

## 2023-04-24 DIAGNOSIS — Z886 Allergy status to analgesic agent status: Secondary | ICD-10-CM | POA: Diagnosis not present

## 2023-04-24 DIAGNOSIS — Z5112 Encounter for antineoplastic immunotherapy: Secondary | ICD-10-CM | POA: Diagnosis present

## 2023-04-24 DIAGNOSIS — C541 Malignant neoplasm of endometrium: Secondary | ICD-10-CM

## 2023-04-24 DIAGNOSIS — D631 Anemia in chronic kidney disease: Secondary | ICD-10-CM | POA: Diagnosis not present

## 2023-04-24 DIAGNOSIS — N189 Chronic kidney disease, unspecified: Secondary | ICD-10-CM | POA: Insufficient documentation

## 2023-04-24 DIAGNOSIS — I129 Hypertensive chronic kidney disease with stage 1 through stage 4 chronic kidney disease, or unspecified chronic kidney disease: Secondary | ICD-10-CM | POA: Diagnosis not present

## 2023-04-24 DIAGNOSIS — Z801 Family history of malignant neoplasm of trachea, bronchus and lung: Secondary | ICD-10-CM | POA: Diagnosis not present

## 2023-04-24 DIAGNOSIS — I700 Atherosclerosis of aorta: Secondary | ICD-10-CM | POA: Diagnosis not present

## 2023-04-24 DIAGNOSIS — Z923 Personal history of irradiation: Secondary | ICD-10-CM | POA: Insufficient documentation

## 2023-04-24 DIAGNOSIS — G629 Polyneuropathy, unspecified: Secondary | ICD-10-CM | POA: Insufficient documentation

## 2023-04-24 DIAGNOSIS — Z86018 Personal history of other benign neoplasm: Secondary | ICD-10-CM | POA: Diagnosis not present

## 2023-04-24 DIAGNOSIS — E1122 Type 2 diabetes mellitus with diabetic chronic kidney disease: Secondary | ICD-10-CM | POA: Insufficient documentation

## 2023-04-24 DIAGNOSIS — Z79620 Long term (current) use of immunosuppressive biologic: Secondary | ICD-10-CM | POA: Diagnosis not present

## 2023-04-24 DIAGNOSIS — Z9049 Acquired absence of other specified parts of digestive tract: Secondary | ICD-10-CM | POA: Insufficient documentation

## 2023-04-24 DIAGNOSIS — Z8719 Personal history of other diseases of the digestive system: Secondary | ICD-10-CM | POA: Insufficient documentation

## 2023-04-24 DIAGNOSIS — Z881 Allergy status to other antibiotic agents status: Secondary | ICD-10-CM | POA: Insufficient documentation

## 2023-04-24 DIAGNOSIS — Z79899 Other long term (current) drug therapy: Secondary | ICD-10-CM | POA: Diagnosis not present

## 2023-04-24 DIAGNOSIS — Z9089 Acquired absence of other organs: Secondary | ICD-10-CM | POA: Insufficient documentation

## 2023-04-24 DIAGNOSIS — K5730 Diverticulosis of large intestine without perforation or abscess without bleeding: Secondary | ICD-10-CM | POA: Insufficient documentation

## 2023-04-24 DIAGNOSIS — I2510 Atherosclerotic heart disease of native coronary artery without angina pectoris: Secondary | ICD-10-CM | POA: Insufficient documentation

## 2023-04-24 LAB — COMPREHENSIVE METABOLIC PANEL
ALT: 20 U/L (ref 0–44)
AST: 22 U/L (ref 15–41)
Albumin: 3.9 g/dL (ref 3.5–5.0)
Alkaline Phosphatase: 55 U/L (ref 38–126)
Anion gap: 8 (ref 5–15)
BUN: 29 mg/dL — ABNORMAL HIGH (ref 8–23)
CO2: 20 mmol/L — ABNORMAL LOW (ref 22–32)
Calcium: 9 mg/dL (ref 8.9–10.3)
Chloride: 107 mmol/L (ref 98–111)
Creatinine, Ser: 1.02 mg/dL — ABNORMAL HIGH (ref 0.44–1.00)
GFR, Estimated: 59 mL/min — ABNORMAL LOW (ref >60)
Glucose, Bld: 149 mg/dL — ABNORMAL HIGH (ref 70–99)
Potassium: 4.2 mmol/L (ref 3.5–5.1)
Sodium: 135 mmol/L (ref 135–145)
Total Bilirubin: 0.3 mg/dL (ref 0.0–1.2)
Total Protein: 6.9 g/dL (ref 6.5–8.1)

## 2023-04-24 LAB — CBC WITH DIFFERENTIAL/PLATELET
Abs Immature Granulocytes: 0.05 10*3/uL (ref 0.00–0.07)
Basophils Absolute: 0 10*3/uL (ref 0.0–0.1)
Basophils Relative: 1 %
Eosinophils Absolute: 0.3 10*3/uL (ref 0.0–0.5)
Eosinophils Relative: 4 %
HCT: 35.9 % — ABNORMAL LOW (ref 36.0–46.0)
Hemoglobin: 11.5 g/dL — ABNORMAL LOW (ref 12.0–15.0)
Immature Granulocytes: 1 %
Lymphocytes Relative: 18 %
Lymphs Abs: 1.2 10*3/uL (ref 0.7–4.0)
MCH: 30.7 pg (ref 26.0–34.0)
MCHC: 32 g/dL (ref 30.0–36.0)
MCV: 96 fL (ref 80.0–100.0)
Monocytes Absolute: 0.7 10*3/uL (ref 0.1–1.0)
Monocytes Relative: 11 %
Neutro Abs: 4.5 10*3/uL (ref 1.7–7.7)
Neutrophils Relative %: 65 %
Platelets: 298 10*3/uL (ref 150–400)
RBC: 3.74 MIL/uL — ABNORMAL LOW (ref 3.87–5.11)
RDW: 13.5 % (ref 11.5–15.5)
WBC: 6.8 10*3/uL (ref 4.0–10.5)
nRBC: 0 % (ref 0.0–0.2)

## 2023-04-24 LAB — TSH: TSH: 1.206 u[IU]/mL (ref 0.350–4.500)

## 2023-04-24 MED ORDER — HEPARIN SOD (PORK) LOCK FLUSH 100 UNIT/ML IV SOLN
500.0000 [IU] | Freq: Once | INTRAVENOUS | Status: AC | PRN
  Administered 2023-04-24: 500 [IU]
  Filled 2023-04-24: qty 5

## 2023-04-24 MED ORDER — SODIUM CHLORIDE 0.9 % IV SOLN
200.0000 mg | Freq: Once | INTRAVENOUS | Status: AC
  Administered 2023-04-24: 200 mg via INTRAVENOUS
  Filled 2023-04-24: qty 200

## 2023-04-24 MED ORDER — SODIUM CHLORIDE 0.9 % IV SOLN
Freq: Once | INTRAVENOUS | Status: AC
  Filled 2023-04-24: qty 250

## 2023-04-24 MED ORDER — GABAPENTIN 100 MG PO CAPS: 100.0000 mg | ORAL_CAPSULE | Freq: Every day | ORAL | 1 refills | Status: DC

## 2023-04-24 NOTE — Patient Instructions (Signed)
 [facility] - A DEPT OF [facility]  Discharge Instructions: Thank you for choosing [facility] to provide your oncology and hematology care.  If you have a lab appointment with the Cancer Center, please go directly to the Cancer Center and check in at the registration area.  Wear comfortable clothing and clothing appropriate for easy access to any Portacath or PICC line.   We strive to give you quality time with your provider. You may need to reschedule your appointment if you arrive late (15 or more minutes).  Arriving late affects you and other patients whose appointments are after yours.  Also, if you miss three or more appointments without notifying the office, you may be dismissed from the clinic at the provider's discretion.      For prescription refill requests, have your pharmacy contact our office and allow 72 hours for refills to be completed.    Today you received the following chemotherapy and/or immunotherapy agents Rande Lawman      To help prevent nausea and vomiting after your treatment, we encourage you to take your nausea medication as directed.  BELOW ARE SYMPTOMS THAT SHOULD BE REPORTED IMMEDIATELY: *FEVER GREATER THAN 100.4 F (38 C) OR HIGHER *CHILLS OR SWEATING *NAUSEA AND VOMITING THAT IS NOT CONTROLLED WITH YOUR NAUSEA MEDICATION *UNUSUAL SHORTNESS OF BREATH *UNUSUAL BRUISING OR BLEEDING *URINARY PROBLEMS (pain or burning when urinating, or frequent urination) *BOWEL PROBLEMS (unusual diarrhea, constipation, pain near the anus) TENDERNESS IN MOUTH AND THROAT WITH OR WITHOUT PRESENCE OF ULCERS (sore throat, sores in mouth, or a toothache) UNUSUAL RASH, SWELLING OR PAIN  UNUSUAL VAGINAL DISCHARGE OR ITCHING   Items with * indicate a potential emergency and should be followed up as soon as possible or go to the Emergency Department if any problems should occur.  Please show the CHEMOTHERAPY ALERT CARD or IMMUNOTHERAPY  ALERT CARD at check-in to the Emergency Department and triage nurse.  Should you have questions after your visit or need to cancel or reschedule your appointment, please contact [facility] - A DEPT OF [facility]  [phone] and follow the prompts.  Office hours are 8:00 a.m. to 4:30 p.m. Monday - Friday. Please note that voicemails left after 4:00 p.m. may not be returned until the following business day.  We are closed weekends and major holidays. You have access to a nurse at all times for urgent questions. Please call the main number to the clinic [phone] and follow the prompts.  For any non-urgent questions, you may also contact your provider using MyChart. We now offer e-Visits for anyone 48 and older to request care online for non-urgent symptoms. For details visit [email or web address].   Also download the MyChart app! Go to the app store, search "MyChart", open the app, select [facility] [place], and log in with your MyChart username and password.

## 2023-04-24 NOTE — Progress Notes (Signed)
 Hematology/Oncology Progress Note [phone] [phone]    CHIEF COMPLAINTS/REASON FOR VISIT:  Follow up for endometrial cancer  ASSESSMENT & PLAN:   Cancer Staging  Endometrial adenocarcinoma [facility]) Staging form: Corpus Uteri - Carcinoma and Carcinosarcoma, AJCC 8th Edition - Clinical stage from 04/10/2021: FIGO Stage III, calculated as Stage Unknown (cT3, cNX, cM0) - Signed by Babara Call, MD on 08/24/2021   Endometrial adenocarcinoma [facility]) Metastatic poorly differentiated, FIGO grade 3 endometrial adenocarcinoma, MSI-H s/p palliative WPRT d/t bleeding and chemotherapy -  carboplatin  and Taxol  for 5 cycles, keytruda  added at cycle 4 (10/18/21), now on Keytruda  maintenance.  CT for restaging on 04/17/23 performed but not yet finalized.  Clinically asymptomatic of progressive disease.  Labs reviewed and acceptable for treatment Proceed with pembrolizumab  today. Follow up with gyn onc for Pelvic exam in March.    Thyroid  nodule Thyroid  nodule biopsy- benign Recommend patient to follow up with endocrinology for management.    Neuropathy Continue  gabapentin  100mg  daily. Refilled today.    Anemia in chronic kidney disease (CKD) Stable hemoglobin  Return of visit:  Keytruda  today F/u as scheduled- la  No problem-specific Assessment & Plan notes found for this encounter.   No orders of the defined types were placed in this encounter.  Tinnie Dawn, DNP, AGNP-C, AOCNP [facility] [phone] (clinic) 04/24/2023     HISTORY OF PRESENTING ILLNESS:  Valerie Case is a  73 y.o.  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 Oncology History  Endometrial adenocarcinoma (HCC)  12/12/2020 Imaging   12/12/2020, CT abdomen pelvis with contrast showed no radiographic evidence of urinary tract neoplasm, calculi, hydronephrosis.  2.6 cm nonspecific left adrenal mass. 02/12/2021 CT  hematuria work-up showed small uterine fibroids, no findings to explain hematuria.Hepatomegaly, diverticulosis without evidence of diverticulitis, Coronary artery disease   04/01/2021 -  Hospital Admission   04/01/2021 - 04/03/2021, patient was hospitalized due to symptomatic anemia, hemoglobin 6.9, heavy postmenopausal bleeding with passing large clots.  She also presented with increased creatinine level to 2.58 compared to her baseline level of 0.9 in November 2022.  Patient received IV iron  infusion and 2 units of PRBC during the hospital stay..  04/02/2021, iron  panel showed iron  saturation 37, ferritin 37, TIBC 333-the studies were done after patient received blood transfusion.  At discharge, hemoglobin was 7.7.   04/01/2021 Initial Diagnosis   04/01/2020, endometrial biopsy showed poorly differentiated endometrial adenocarcinoma. Omniseq NGS showed TMB 48.4 mt/mb [high], MSI High, PD-L1-TPS <1%, PIK3CA H1047Q, Negative for BRAF, HER2, NTRK1 RET.     04/10/2021 Cancer Staging   Staging form: Corpus Uteri - Carcinoma and Carcinosarcoma, AJCC 8th Edition - Clinical stage from 04/10/2021: FIGO Stage III, calculated as Stage Unknown (cT3, cNX, cM0) - Signed by Babara Call, MD on 08/24/2021 Stage prefix: Initial diagnosis   04/15/2021 Imaging   PET showed 1. Large hypermetabolic endometrial mass consistent with known endometrial cancer. Suspect direct invasion/involvement of the right adnexa. Right pelvic sidewall hypermetabolic adenopathy.2. 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 3. No findings for metastatic disease involving the chest or bonystructures.     04/24/2021 - 05/29/2021 Radiation Therapy   status post radiation to pelvis   08/05/2021 Imaging   MRI abdomen w wo contrast 1. Stable solid enhancing 2.7 cm left adrenal lesion, which was hypermetabolic on prior PET/CTs but is unchanged in size dating  back  to December 12, 2020. 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 However, unfortunately metastatic disease can not be entirely excluded and remains a pertinent differential consideration. Comparison with more remote prior imaging would be the most valuable tool in the assessment of this lesion, as demonstrating long-term stability would indicate this to be a benign lesion. However, if no prior imaging can be made available, would consider follow-up adrenal protocol CT with and without intravenous contrast material in 3 months as this would allow for further assessment of stability as well as enhancement and washout characteristics of the lesion potentially allowing for better characterization versus direct tissue sampling.2. Hepatomegaly and hepatic steatosis.3. Colonic diverticulosis without findings of acute diverticulitis    08/16/2021 - 09/27/2021 Chemotherapy   UTERINE Carboplatin  AUC 5 / Paclitaxel  q21d x 3      10/03/2021 Imaging   PET 1. No residual hypermetabolism in the uterus suggesting an excellent response to treatment. No findings for metastatic disease. 2. Resolution of hypermetabolism in the left adrenal gland lesion suggesting this was metastatic disease. 3. Diffuse marrow hypermetabolism likely due to rebound from chemotherapy or marrow stimulating drugs   10/18/2021 - 11/08/2021 Chemotherapy   AUC 5 / Paclitaxel / Keytruda  Q21d  x 2 cycles   11/29/2021 -  Chemotherapy   Patient is on Treatment Plan : UTERINE Pembrolizumab  (200) q21d     04/30/2022 Imaging   CT chest abdomen pelvis w contrast  1. Similar to minimal decrease in size of a left adrenal nodule which was felt suspicious for metastatic disease on 10/03/2021 PET. 2. No evidence of new or progressive disease. 3. Coronary artery atherosclerosis. Aortic  Atherosclerosis (ICD10-I70.0). 4. Incidental findings, including: Hepatomegaly. Possible constipation.   08/28/2022 Imaging   CT chest abdomen pelvis w contrast showed 1. Similar appearance of indeterminate left adrenal nodule. 2. Otherwise, no evidence of metastatic disease. 3. Hepatomegaly 4.  Possible constipation. 5. Coronary artery atherosclerosis. Aortic Atherosclerosis (ICD10-I70.0).   12/22/2022 Imaging   CT chest abdomen pelvis w contrast showed 1. Stable indeterminate left adrenal nodule. 2. Otherwise, no noncontrast enhanced CT evidence of metastatic disease. 3. Colonic diverticulosis without findings of acute diverticulitis. 4.  Aortic Atherosclerosis (ICD10-I70.0).         INTERVAL HISTORY Valerie Case is a 73 y.o. female with above history of endometrial cancer, currently on maintenance pembrolizumab , who returns to clinic for consideration of treatment. She denies shortness of breath, cough, or rash. No diarrhea. Continues to tolerate treatment well. No vaginal bleeding, discharge, or pelvic pain.    Review of Systems  Constitutional:  Positive for fatigue. Negative for chills and fever.  HENT:   Negative for hearing loss and voice change.   Eyes:  Negative for eye problems.  Respiratory:  Negative for chest tightness and cough.   Cardiovascular:  Negative for chest pain.  Gastrointestinal:  Negative for abdominal distention, abdominal pain and blood in stool.  Endocrine: Negative for hot flashes.  Genitourinary:  Negative for difficulty urinating, frequency and vaginal bleeding.   Musculoskeletal:  Positive for arthralgias.  Skin:  Negative for itching and rash.  Neurological:  Negative for extremity weakness and headaches.  Hematological:  Negative for adenopathy.  Psychiatric/Behavioral:  Negative for confusion.     MEDICAL HISTORY:  Past Medical History:  Diagnosis Date   Anemia    Aortic atherosclerosis (HCC)    Arthritis  Asthma    B12  deficiency    Cancer (HCC)    Cancer of endometrium (HCC)    Coronary artery disease    Depression 04/01/2021   Diabetes mellitus without complication (HCC)    Diverticulosis    Essential hypertension 04/01/2021   Hemorrhoids    History of rectal bleeding    Hx of dysplastic nevus 07/16/2010   RLQA   Hypertension    Insulin  dependent type 2 diabetes mellitus (HCC) 04/01/2021   Mixed hyperlipidemia    Peripheral polyneuropathy    Sleep apnea     SURGICAL HISTORY: Past Surgical History:  Procedure Laterality Date   APPENDECTOMY     BIOPSY  03/17/2023   Procedure: BIOPSY;  Surgeon: Onita Elspeth Sharper, DO;  Location: [facility] ENDOSCOPY;  Service: Gastroenterology;;   CESAREAN SECTION  01/23/1979   COLONOSCOPY     COLONOSCOPY N/A 03/17/2023   Procedure: COLONOSCOPY;  Surgeon: Onita Elspeth Sharper, DO;  Location[facility] ENDOSCOPY;  Service: Gastroenterology;  Laterality: N/A;   COLONOSCOPY WITH PROPOFOL  N/A 03/13/2023   Procedure: COLONOSCOPY WITH PROPOFOL ;  Surgeon: Onita Elspeth Sharper, DO;  Location: [facility] ENDOSCOPY;  Service: Gastroenterology;  Laterality: N/A;   ESOPHAGOGASTRODUODENOSCOPY     ESOPHAGOGASTRODUODENOSCOPY N/A 03/17/2023   Procedure: ESOPHAGOGASTRODUODENOSCOPY (EGD);  Surgeon: Onita Elspeth Sharper, DO;  Location: [facility] ENDOSCOPY;  Service: Gastroenterology;  Laterality: N/A;   ESOPHAGOGASTRODUODENOSCOPY (EGD) WITH PROPOFOL  N/A 03/13/2023   Procedure: ESOPHAGOGASTRODUODENOSCOPY (EGD) WITH PROPOFOL ;  Surgeon: Onita Elspeth Sharper, DO;  Location: [facility] ENDOSCOPY;  Service: Gastroenterology;  Laterality: N/A;   IR IMAGING GUIDED PORT INSERTION  08/09/2021   TONSILLECTOMY  03/17/1955    SOCIAL HISTORY: Social History   Socioeconomic History   Marital status: Divorced    Spouse name: Not on file   Number of children: Not on file   Years of education: Not on file   Highest education level: Not on file  Occupational History   Not on file  Tobacco Use   Smoking  status: Never   Smokeless tobacco: Never  Vaping Use   Vaping status: Never Used  Substance and Sexual Activity   Alcohol use: Not Currently   Drug use: Never   Sexual activity: Not Currently  Other Topics Concern   Not on file  Social History Narrative      Social Drivers of Health   Financial Resource Strain: Low Risk  (01/15/2023)   Received from [facility]   Overall Financial Resource Strain (CARDIA)    Difficulty of Paying Living Expenses: Not hard at all  Food Insecurity: No Food Insecurity (01/15/2023)   Received from [facility]   Hunger Vital Sign    Worried About Running Out of Food in the Last Year: Never true    Ran Out of Food in the Last Year: Never true  Transportation Needs: No Transportation Needs (01/15/2023)   Received from [facility] - Transportation    In the past 12 months, has lack of transportation kept you from medical appointments or from getting medications?: No    Lack of Transportation (Non-Medical): No  Physical Activity: Inactive (09/27/2021)   Exercise Vital Sign    Days of Exercise per Week: 0 days    Minutes of Exercise per Session: 0 min  Stress: Stress Concern Present (09/27/2021)   [employer] of Occupational [employer] - Occupational Stress Questionnaire    Feeling of Stress : Rather much  Social Connections: Moderately Integrated (09/27/2021)   Social Connection  and Isolation Panel [NHANES]    Frequency of Communication with Friends and Family: Three times a week    Frequency of Social Gatherings with Friends and Family: Three times a week    Attends Religious Services: 1 to 4 times per year    Active Member of Clubs or Organizations: No    Attends Banker Meetings: 1 to 4 times per year    Marital Status: Divorced  Intimate Partner Violence: Not At Risk (09/27/2021)   Humiliation, Afraid, Rape, and Kick questionnaire    Fear of Current or Ex-Partner: No     Emotionally Abused: No    Physically Abused: No    Sexually Abused: No    FAMILY HISTORY: Family History  Problem Relation Age of Onset   Breast cancer Mother 54   Cancer Mother    Cancer Father    Lung cancer Father    Breast cancer Cousin        dx 44s maternal    ALLERGIES:  is allergic to doxycycline  and aspirin.  MEDICATIONS:  Current Outpatient Medications  Medication Sig Dispense Refill   acetaminophen  (TYLENOL ) 650 MG CR tablet Take 1,300 mg by mouth every 8 (eight) hours as needed for pain.     albuterol  (VENTOLIN  HFA) 108 (90 Base) MCG/ACT inhaler Inhale 2 puffs into the lungs every 6 (six) hours as needed for wheezing or shortness of breath.     azelastine  (ASTELIN ) 0.1 % nasal spray Place 2 sprays into both nostrils daily. Use in each nostril as directed 30 mL 0   Cranberry-Vitamin C-Probiotic (AZO CRANBERRY PO) Take by mouth.     docusate sodium  (COLACE) 100 MG capsule Take 1 capsule (100 mg total) by mouth 2 (two) times daily. 60 capsule 1   empagliflozin (JARDIANCE) 25 MG TABS tablet Take 25 mg by mouth daily.     ferrous sulfate 325 (65 FE) MG EC tablet Take 325 mg by mouth daily with breakfast.     fluconazole  (DIFLUCAN ) 150 MG tablet Take 1 tablet (150 mg total) by mouth daily. Take 1 tablet day one and repeat 2 days later. (Patient not taking: Reported on 04/03/2023) 2 tablet 0   FLUoxetine  (PROZAC ) 10 MG capsule Take 10 mg by mouth daily.     fluticasone  (FLONASE ) 50 MCG/ACT nasal spray Place 1 spray into both nostrils daily as needed for allergies or rhinitis.     furosemide (LASIX) 20 MG tablet Take 20 mg by mouth daily.     gabapentin  (NEURONTIN ) 100 MG capsule Take 1 capsule (100 mg total) by mouth daily. 90 capsule 1   hydrochlorothiazide  (HYDRODIURIL ) 12.5 MG tablet Take 12.5 mg by mouth daily.     hydrocortisone  2.5 % cream Apply topically 3 (three) times daily. 30 g 2   ibuprofen (ADVIL) 200 MG tablet Take 400 mg by mouth every 8 (eight) hours as needed  for moderate pain.     Insulin  Asp Prot & Asp FlexPen (NOVOLOG  70/30 MIX) (70-30) 100 UNIT/ML FlexPen Inject into the skin.     lidocaine -prilocaine  (EMLA ) cream Apply to affected area once 30 g 3   lisinopril  (ZESTRIL ) 40 MG tablet Take 40 mg by mouth at bedtime.     loratadine (CLARITIN) 10 MG tablet Take by mouth.     LORazepam  (ATIVAN ) 0.5 MG tablet Take 1 tablet (0.5 mg total) by mouth every 8 (eight) hours as needed for anxiety (nausea vomiting). 30 tablet 0   metFORMIN  (GLUCOPHAGE -XR) 500 MG 24 hr tablet Take  1,000 mg by mouth at bedtime.     metoprolol  succinate (TOPROL -XL) 25 MG 24 hr tablet Take 25 mg by mouth daily.     mupirocin  ointment (BACTROBAN ) 2 % Apply 1 Application topically 3 (three) times daily. 22 g 0   NOVOLIN 70/30 FLEXPEN (70-30) 100 UNIT/ML KwikPen 50-110 Units See admin instructions. 50 units in the morning, 110 units at bedtime     omeprazole (PRILOSEC) 20 MG capsule Take 20 mg by mouth daily.     OZEMPIC , 1 MG/DOSE, 4 MG/3ML SOPN Inject 1 mg into the skin every Tuesday.     potassium chloride  SA (KLOR-CON  M) 20 MEQ tablet Take 1 tablet (20 mEq total) by mouth daily. 3 tablet 0   rosuvastatin  (CRESTOR ) 10 MG tablet Take 10 mg by mouth daily.     traMADol  (ULTRAM ) 50 MG tablet Take 2 tablets (100 mg total) by mouth every 6 (six) hours as needed. 90 tablet 0   No current facility-administered medications for this visit.   Facility-Administered Medications Ordered in Other Visits  Medication Dose Route Frequency Provider Last Rate Last Admin   heparin  lock flush 100 UNIT/ML injection              PHYSICAL EXAMINATION: ECOG PERFORMANCE STATUS: 1 - Symptomatic but completely ambulatory  Physical Exam Constitutional:      General: She is not in acute distress.    Appearance: She is obese.  HENT:     Head: Normocephalic and atraumatic.  Eyes:     General: No scleral icterus. Cardiovascular:     Rate and Rhythm: Regular rhythm. Tachycardia present.     Heart  sounds: Murmur heard.  Pulmonary:     Effort: Pulmonary effort is normal. No respiratory distress.  Abdominal:     General: Bowel sounds are normal. There is no distension.     Palpations: Abdomen is soft.  Musculoskeletal:        General: No deformity. Normal range of motion.     Cervical back: Normal range of motion.     Right lower leg: Edema present.     Left lower leg: Edema present.     Comments:    Skin:    General: Skin is warm and dry.     Findings: No rash.     Comments: Chronic venous sufficiency skin changes  Neurological:     Mental Status: She is alert and oriented to person, place, and time. Mental status is at baseline.     Cranial Nerves: No cranial nerve deficit.     Coordination: Coordination normal.  Psychiatric:        Mood and Affect: Mood normal.    LABORATORY DATA:  I have reviewed the data as listed    Latest Ref Rng & Units 04/03/2023    9:06 AM 03/09/2023   12:55 PM 02/16/2023   10:08 AM  CBC  WBC 4.0 - 10.5 K/uL 7.0  7.3  6.2   Hemoglobin 12.0 - 15.0 g/dL 88.2  88.5  88.9   Hematocrit 36.0 - 46.0 % 36.8  35.7  35.6   Platelets 150 - 400 K/uL 294  281  271       Latest Ref Rng & Units 04/03/2023    9:06 AM 03/09/2023   12:55 PM 02/16/2023   10:08 AM  CMP  Glucose 70 - 99 mg/dL 852  859  799   BUN 8 - 23 mg/dL 39  29  37   Creatinine 0.44 - 1.00  mg/dL 8.93  8.95  8.92   Sodium 135 - 145 mmol/L 135  137  141   Potassium 3.5 - 5.1 mmol/L 4.2  4.1  4.0   Chloride 98 - 111 mmol/L 106  105  109   CO2 22 - 32 mmol/L 19  21  20    Calcium  8.9 - 10.3 mg/dL 9.7  89.8  9.1   Total Protein 6.5 - 8.1 g/dL 7.3  7.3  7.0   Total Bilirubin 0.0 - 1.2 mg/dL 0.4  0.4  0.3   Alkaline Phos 38 - 126 U/L 58  48  47   AST 15 - 41 U/L 23  22  21    ALT 0 - 44 U/L 20  19  20     Lab Results  Component Value Date   TSH 1.862 04/03/2023     RADIOGRAPHIC STUDIES: I have personally reviewed the radiological images as listed and agreed with the findings in the  report. US  FNA BX THYROID  1ST LESION AFIRMA Result Date: 02/19/2023 INDICATION: Indeterminate thyroid  nodule meeting ACR TI-RADS criteria for biopsy. PROCEDURE: ULTRASOUND GUIDED FINE NEEDLE ASPIRATION OF INDETERMINATE THYROID  NODULE COMPARISON:  Ultrasound thyroid  02/07/2023. MEDICATIONS: Local 1% lidocaine  only. ANESTHESIA/SEDATION: Local 1% lidocaine  only. COMPLICATIONS: None immediate. Post procedure, the patient was given an ice pack which was applied and discharge instructions which the patient verbally voiced understanding. The patient denied any post procedure site pain, bruising and denied any other procedure related complaints. All of the patient's questions were answered prior to discharge. TECHNIQUE: Informed written consent was obtained from the patient after a discussion of the risks, benefits, non-diagnostic sampling and reflex molecular testing with ThyroSeq and alternatives to treatment. Questions regarding the procedure were encouraged and answered. A timeout was performed prior to the initiation of the procedure. Pre-procedural ultrasound scanning demonstrated unchanged size and appearance of the indeterminate nodule within the left thyroid  lobe. The procedure was planned. The neck was prepped in the usual sterile fashion, and a sterile drape was applied covering the operative field. A timeout was performed prior to the initiation of the procedure. Local anesthesia was provided with 1% lidocaine . Under direct ultrasound guidance, 5 FNA biopsies were performed of the left lower thyroid  nodule with a 25 gauge needle. Multiple ultrasound images were saved for procedural documentation purposes. The samples were prepared and submitted to pathology. One of the passes was taken for ThyroSeq molecular testing if indicated. Limited post procedural scanning was negative for hematoma or additional complication. Dressings were placed. The patient tolerated the above procedures procedure well without  immediate postprocedural complication. FINDINGS: Nodule reference number based on prior diagnostic ultrasound: 5 Maximum size: 3.2 cm Location: Left; lower ACR TI-RADS risk category:  TR3 (3 points) Prior biopsy:  No Reason for biopsy: meets ACR TI-RADS criteria Ultrasound imaging confirms appropriate placement of the needles within the thyroid  nodule. IMPRESSION: Technically successful ultrasound guided fine needle aspiration biopsy of the left lower thyroid  nodule. This exam was performed by Valda Search PA-C, and was supervised by Dr. and interpreted by Dr. Luverne. Electronically Signed   By: Marcey Luverne M.D.   On: 02/19/2023 15:55   US  THYROID  Result Date: 02/07/2023 CLINICAL DATA:  Goiter. EXAM: THYROID  ULTRASOUND TECHNIQUE: Ultrasound examination of the thyroid  gland and adjacent soft tissues was performed. COMPARISON:  None Available. FINDINGS: Parenchymal Echotexture: Moderately heterogenous Isthmus: 0.2 cm Right lobe: 4.4 x 2.5 x 2.2 cm Left lobe: 4.6 x 2.9 x 2.8 cm _________________________________________________________ Estimated total number of nodules >/=  1 cm: 2 Number of spongiform nodules >/=  2 cm not described below (TR1): 0 Number of mixed cystic and solid nodules >/= 1.5 cm not described below (TR2): 0 _________________________________________________________ Diffusely enlarged, heterogeneous and goitrous thyroid  gland. Nodule # 1, # 2 and # 3 are subcentimeter nodules scattered throughout the right gland. No further follow-up is recommended. Nodule # 4: Cluster of smaller isoechoic solid nodules in the right lower gland. Measured independently, no individual nodule measures larger than 1 cm. No further follow-up. Nodule # 5: Isoechoic ill-defined solid nodule in the left lower gland measures approximately 3.2 x 2.5 x 1.8 cm. Findings are consistent with TI-RADS category 3. **Given size (>/= 2.5 cm) and appearance, fine needle aspiration of this mildly suspicious nodule should be  considered based on TI-RADS criteria. IMPRESSION: 1. Heterogeneous enlarged goitrous thyroid  gland. 2. Nodule # 5 is a 3.2 cm TI-RADS category 3 nodule in the left lower gland meets criteria to consider fine-needle aspiration biopsy. Biopsy is recommended. The above is in keeping with the ACR TI-RADS recommendations - J Am Coll Radiol 2017;14:587-595. Electronically Signed   By: Wilkie Lent M.D.   On: 02/07/2023 14:38

## 2023-05-15 ENCOUNTER — Telehealth: Payer: Self-pay | Admitting: *Deleted

## 2023-05-15 ENCOUNTER — Inpatient Hospital Stay: Payer: Medicare PPO

## 2023-05-15 ENCOUNTER — Encounter: Payer: Self-pay | Admitting: Oncology

## 2023-05-15 ENCOUNTER — Other Ambulatory Visit: Payer: Self-pay | Admitting: Oncology

## 2023-05-15 ENCOUNTER — Inpatient Hospital Stay: Payer: Medicare PPO | Admitting: Oncology

## 2023-05-15 DIAGNOSIS — C541 Malignant neoplasm of endometrium: Secondary | ICD-10-CM

## 2023-05-15 NOTE — Telephone Encounter (Signed)
 She had 24 hour diarrhea and stopped 6 am, she feels that she needs to stay home and get liquids in her. reschedule

## 2023-05-15 NOTE — Assessment & Plan Note (Deleted)
 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 s/p carboplatin and Taxol for 5 cycles, on Keytruda maintenance.  Labs are reviewed and discussed with patient.  Proceed with Keytruda Repeat CT prior to next visit.

## 2023-05-20 ENCOUNTER — Inpatient Hospital Stay: Payer: Medicare PPO | Attending: Obstetrics and Gynecology

## 2023-05-20 DIAGNOSIS — C541 Malignant neoplasm of endometrium: Secondary | ICD-10-CM | POA: Insufficient documentation

## 2023-05-20 DIAGNOSIS — R160 Hepatomegaly, not elsewhere classified: Secondary | ICD-10-CM | POA: Insufficient documentation

## 2023-05-20 DIAGNOSIS — D631 Anemia in chronic kidney disease: Secondary | ICD-10-CM | POA: Insufficient documentation

## 2023-05-20 DIAGNOSIS — Z9089 Acquired absence of other organs: Secondary | ICD-10-CM | POA: Insufficient documentation

## 2023-05-20 DIAGNOSIS — I129 Hypertensive chronic kidney disease with stage 1 through stage 4 chronic kidney disease, or unspecified chronic kidney disease: Secondary | ICD-10-CM | POA: Insufficient documentation

## 2023-05-20 DIAGNOSIS — R5383 Other fatigue: Secondary | ICD-10-CM | POA: Insufficient documentation

## 2023-05-20 DIAGNOSIS — I700 Atherosclerosis of aorta: Secondary | ICD-10-CM | POA: Insufficient documentation

## 2023-05-20 DIAGNOSIS — Z86018 Personal history of other benign neoplasm: Secondary | ICD-10-CM | POA: Insufficient documentation

## 2023-05-20 DIAGNOSIS — K760 Fatty (change of) liver, not elsewhere classified: Secondary | ICD-10-CM | POA: Insufficient documentation

## 2023-05-20 DIAGNOSIS — Z9049 Acquired absence of other specified parts of digestive tract: Secondary | ICD-10-CM | POA: Insufficient documentation

## 2023-05-20 DIAGNOSIS — Z881 Allergy status to other antibiotic agents status: Secondary | ICD-10-CM | POA: Insufficient documentation

## 2023-05-20 DIAGNOSIS — Z79899 Other long term (current) drug therapy: Secondary | ICD-10-CM | POA: Insufficient documentation

## 2023-05-20 DIAGNOSIS — E669 Obesity, unspecified: Secondary | ICD-10-CM | POA: Insufficient documentation

## 2023-05-20 DIAGNOSIS — Z886 Allergy status to analgesic agent status: Secondary | ICD-10-CM | POA: Insufficient documentation

## 2023-05-20 DIAGNOSIS — E1122 Type 2 diabetes mellitus with diabetic chronic kidney disease: Secondary | ICD-10-CM | POA: Insufficient documentation

## 2023-05-20 DIAGNOSIS — Z8719 Personal history of other diseases of the digestive system: Secondary | ICD-10-CM | POA: Insufficient documentation

## 2023-05-20 DIAGNOSIS — E279 Disorder of adrenal gland, unspecified: Secondary | ICD-10-CM | POA: Insufficient documentation

## 2023-05-20 DIAGNOSIS — Z79620 Long term (current) use of immunosuppressive biologic: Secondary | ICD-10-CM | POA: Insufficient documentation

## 2023-05-20 DIAGNOSIS — Z803 Family history of malignant neoplasm of breast: Secondary | ICD-10-CM | POA: Insufficient documentation

## 2023-05-20 DIAGNOSIS — Z5112 Encounter for antineoplastic immunotherapy: Secondary | ICD-10-CM | POA: Insufficient documentation

## 2023-05-20 DIAGNOSIS — Z809 Family history of malignant neoplasm, unspecified: Secondary | ICD-10-CM | POA: Insufficient documentation

## 2023-05-20 DIAGNOSIS — K5730 Diverticulosis of large intestine without perforation or abscess without bleeding: Secondary | ICD-10-CM | POA: Insufficient documentation

## 2023-05-20 DIAGNOSIS — Z801 Family history of malignant neoplasm of trachea, bronchus and lung: Secondary | ICD-10-CM | POA: Insufficient documentation

## 2023-05-20 DIAGNOSIS — M2550 Pain in unspecified joint: Secondary | ICD-10-CM | POA: Insufficient documentation

## 2023-05-20 DIAGNOSIS — G629 Polyneuropathy, unspecified: Secondary | ICD-10-CM | POA: Insufficient documentation

## 2023-05-20 DIAGNOSIS — Z923 Personal history of irradiation: Secondary | ICD-10-CM | POA: Insufficient documentation

## 2023-05-20 DIAGNOSIS — N189 Chronic kidney disease, unspecified: Secondary | ICD-10-CM | POA: Insufficient documentation

## 2023-05-20 DIAGNOSIS — I2510 Atherosclerotic heart disease of native coronary artery without angina pectoris: Secondary | ICD-10-CM | POA: Insufficient documentation

## 2023-05-22 ENCOUNTER — Inpatient Hospital Stay: Payer: Medicare PPO | Admitting: Oncology

## 2023-05-22 ENCOUNTER — Inpatient Hospital Stay: Payer: Medicare PPO

## 2023-05-22 ENCOUNTER — Encounter: Payer: Self-pay | Admitting: Oncology

## 2023-05-22 VITALS — BP 123/60 | HR 97 | Temp 99.7°F | Resp 18 | Wt 284.5 lb

## 2023-05-22 VITALS — BP 137/65 | HR 83 | Temp 97.8°F | Resp 19

## 2023-05-22 DIAGNOSIS — Z79899 Other long term (current) drug therapy: Secondary | ICD-10-CM | POA: Diagnosis not present

## 2023-05-22 DIAGNOSIS — Z9049 Acquired absence of other specified parts of digestive tract: Secondary | ICD-10-CM | POA: Diagnosis not present

## 2023-05-22 DIAGNOSIS — G629 Polyneuropathy, unspecified: Secondary | ICD-10-CM

## 2023-05-22 DIAGNOSIS — D631 Anemia in chronic kidney disease: Secondary | ICD-10-CM

## 2023-05-22 DIAGNOSIS — K760 Fatty (change of) liver, not elsewhere classified: Secondary | ICD-10-CM | POA: Diagnosis not present

## 2023-05-22 DIAGNOSIS — I2510 Atherosclerotic heart disease of native coronary artery without angina pectoris: Secondary | ICD-10-CM | POA: Diagnosis not present

## 2023-05-22 DIAGNOSIS — Z79620 Long term (current) use of immunosuppressive biologic: Secondary | ICD-10-CM | POA: Diagnosis not present

## 2023-05-22 DIAGNOSIS — K5730 Diverticulosis of large intestine without perforation or abscess without bleeding: Secondary | ICD-10-CM | POA: Diagnosis not present

## 2023-05-22 DIAGNOSIS — C541 Malignant neoplasm of endometrium: Secondary | ICD-10-CM

## 2023-05-22 DIAGNOSIS — R160 Hepatomegaly, not elsewhere classified: Secondary | ICD-10-CM | POA: Diagnosis not present

## 2023-05-22 DIAGNOSIS — Z886 Allergy status to analgesic agent status: Secondary | ICD-10-CM | POA: Diagnosis not present

## 2023-05-22 DIAGNOSIS — I129 Hypertensive chronic kidney disease with stage 1 through stage 4 chronic kidney disease, or unspecified chronic kidney disease: Secondary | ICD-10-CM | POA: Diagnosis not present

## 2023-05-22 DIAGNOSIS — E279 Disorder of adrenal gland, unspecified: Secondary | ICD-10-CM | POA: Diagnosis not present

## 2023-05-22 DIAGNOSIS — E278 Other specified disorders of adrenal gland: Secondary | ICD-10-CM

## 2023-05-22 DIAGNOSIS — E669 Obesity, unspecified: Secondary | ICD-10-CM | POA: Diagnosis not present

## 2023-05-22 DIAGNOSIS — Z5112 Encounter for antineoplastic immunotherapy: Secondary | ICD-10-CM | POA: Diagnosis present

## 2023-05-22 DIAGNOSIS — Z923 Personal history of irradiation: Secondary | ICD-10-CM | POA: Diagnosis not present

## 2023-05-22 DIAGNOSIS — N189 Chronic kidney disease, unspecified: Secondary | ICD-10-CM | POA: Diagnosis not present

## 2023-05-22 DIAGNOSIS — R5383 Other fatigue: Secondary | ICD-10-CM | POA: Diagnosis not present

## 2023-05-22 DIAGNOSIS — M2550 Pain in unspecified joint: Secondary | ICD-10-CM | POA: Diagnosis not present

## 2023-05-22 DIAGNOSIS — I700 Atherosclerosis of aorta: Secondary | ICD-10-CM | POA: Diagnosis not present

## 2023-05-22 DIAGNOSIS — Z8719 Personal history of other diseases of the digestive system: Secondary | ICD-10-CM | POA: Diagnosis not present

## 2023-05-22 DIAGNOSIS — Z881 Allergy status to other antibiotic agents status: Secondary | ICD-10-CM | POA: Diagnosis not present

## 2023-05-22 DIAGNOSIS — Z86018 Personal history of other benign neoplasm: Secondary | ICD-10-CM | POA: Diagnosis not present

## 2023-05-22 DIAGNOSIS — E1122 Type 2 diabetes mellitus with diabetic chronic kidney disease: Secondary | ICD-10-CM | POA: Diagnosis not present

## 2023-05-22 LAB — CBC WITH DIFFERENTIAL/PLATELET
Abs Immature Granulocytes: 0.06 10*3/uL (ref 0.00–0.07)
Basophils Absolute: 0.1 10*3/uL (ref 0.0–0.1)
Basophils Relative: 1 %
Eosinophils Absolute: 0.4 10*3/uL (ref 0.0–0.5)
Eosinophils Relative: 6 %
HCT: 36 % (ref 36.0–46.0)
Hemoglobin: 11.7 g/dL — ABNORMAL LOW (ref 12.0–15.0)
Immature Granulocytes: 1 %
Lymphocytes Relative: 19 %
Lymphs Abs: 1.3 10*3/uL (ref 0.7–4.0)
MCH: 31.4 pg (ref 26.0–34.0)
MCHC: 32.5 g/dL (ref 30.0–36.0)
MCV: 96.5 fL (ref 80.0–100.0)
Monocytes Absolute: 0.7 10*3/uL (ref 0.1–1.0)
Monocytes Relative: 11 %
Neutro Abs: 4.3 10*3/uL (ref 1.7–7.7)
Neutrophils Relative %: 62 %
Platelets: 277 10*3/uL (ref 150–400)
RBC: 3.73 MIL/uL — ABNORMAL LOW (ref 3.87–5.11)
RDW: 13.2 % (ref 11.5–15.5)
WBC: 6.8 10*3/uL (ref 4.0–10.5)
nRBC: 0 % (ref 0.0–0.2)

## 2023-05-22 LAB — COMPREHENSIVE METABOLIC PANEL
ALT: 17 U/L (ref 0–44)
AST: 20 U/L (ref 15–41)
Albumin: 3.6 g/dL (ref 3.5–5.0)
Alkaline Phosphatase: 57 U/L (ref 38–126)
Anion gap: 11 (ref 5–15)
BUN: 32 mg/dL — ABNORMAL HIGH (ref 8–23)
CO2: 21 mmol/L — ABNORMAL LOW (ref 22–32)
Calcium: 9.6 mg/dL (ref 8.9–10.3)
Chloride: 103 mmol/L (ref 98–111)
Creatinine, Ser: 1.05 mg/dL — ABNORMAL HIGH (ref 0.44–1.00)
GFR, Estimated: 57 mL/min — ABNORMAL LOW (ref >60)
Glucose, Bld: 163 mg/dL — ABNORMAL HIGH (ref 70–99)
Potassium: 4.9 mmol/L (ref 3.5–5.1)
Sodium: 135 mmol/L (ref 135–145)
Total Bilirubin: 0.4 mg/dL (ref 0.0–1.2)
Total Protein: 6.9 g/dL (ref 6.5–8.1)

## 2023-05-22 LAB — TSH: TSH: 1.634 u[IU]/mL (ref 0.350–4.500)

## 2023-05-22 MED ORDER — SODIUM CHLORIDE 0.9 % IV SOLN
200.0000 mg | Freq: Once | INTRAVENOUS | Status: AC
  Administered 2023-05-22: 200 mg via INTRAVENOUS
  Filled 2023-05-22: qty 200

## 2023-05-22 MED ORDER — SODIUM CHLORIDE 0.9 % IV SOLN
Freq: Once | INTRAVENOUS | Status: AC
  Filled 2023-05-22: qty 250

## 2023-05-22 MED ORDER — LIDOCAINE-PRILOCAINE 2.5-2.5 % EX CREA: TOPICAL_CREAM | CUTANEOUS | 3 refills | Status: AC

## 2023-05-22 MED ORDER — HEPARIN SOD (PORK) LOCK FLUSH 100 UNIT/ML IV SOLN
500.0000 [IU] | Freq: Once | INTRAVENOUS | Status: AC | PRN
Start: 2023-05-22 — End: 2023-05-22
  Administered 2023-05-22: 500 [IU]
  Filled 2023-05-22: qty 5

## 2023-05-22 NOTE — Assessment & Plan Note (Signed)
 Stable hemoglobin

## 2023-05-22 NOTE — Progress Notes (Signed)
 Hematology/Oncology Progress note [phone] [phone]      CHIEF COMPLAINTS/REASON FOR VISIT:  Follow up for endometrial cancer  ASSESSMENT & PLAN:   Cancer Staging  Endometrial adenocarcinoma [facility]) Staging form: Corpus Uteri - Carcinoma and Carcinosarcoma, AJCC 8th Edition - Clinical stage from 04/10/2021: FIGO Stage III, calculated as Stage Unknown (cT3, cNX, cM0) - Signed by Rickard Patience, MD on 08/24/2021   Endometrial adenocarcinoma (HCC) FIGO grade 3 poorly differentiated endometrial adenocarcinoma. s/p carboplatin and Taxol for 5 cycles, on Keytruda maintenance.  Labs are reviewed and discussed with patient.  Proceed with Keytruda CT showed NED/stable disease,stable adrenal nodule   Anemia in chronic kidney disease (CKD) Stable hemoglobin  Encounter for antineoplastic immunotherapy Treatment plan as listed above.   Neuropathy Continue  gabapentin 100mg  daily  Adrenal mass (HCC) lipid poor hyperfunctioning adenoma vs mets.  FDG avid on PET scan.  Difficult biopsy due to patient's body habitus and deep position of the mass. Resolved on PET scan, suggesting that this lesion might be a metastatic lesion. CT showed stable lesion  Continue monitor    Orders Placed This Encounter  Procedures   CBC with Differential    Standing Status:   Future    Expected Date:   06/12/2023    Expiration Date:   06/11/2024   Comprehensive metabolic panel    Standing Status:   Future    Expected Date:   06/12/2023    Expiration Date:   06/11/2024   CBC with Differential    Standing Status:   Future    Expected Date:   07/03/2023    Expiration Date:   07/02/2024   Comprehensive metabolic panel    Standing Status:   Future    Expected Date:   07/03/2023    Expiration Date:   07/02/2024    Return of visit:  3 weeks Lab MD Greggory Brandy, MD, [name] [facility] Hematology Oncology 05/22/2023     HISTORY OF PRESENTING ILLNESS:   Valerie Case is a   73 y.o.  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 Oncology History  Endometrial adenocarcinoma (HCC)  12/12/2020 Imaging   12/12/2020, CT abdomen pelvis with contrast showed no radiographic evidence of urinary tract neoplasm, calculi, hydronephrosis.  2.6 cm nonspecific left adrenal mass. 02/12/2021 CT hematuria work-up showed small uterine fibroids, no findings to explain hematuria.Hepatomegaly, diverticulosis without evidence of diverticulitis, Coronary artery disease   04/01/2021 -  Hospital Admission   04/01/2021 - 04/03/2021, patient was hospitalized due to symptomatic anemia, hemoglobin 6.9, heavy postmenopausal bleeding with passing large clots.  She also presented with increased creatinine level to 2.58 compared to her baseline level of 0.9 in November 2022.  Patient received IV iron infusion and 2 units of PRBC during the hospital stay..  04/02/2021, iron panel showed iron saturation 37, ferritin 37, TIBC 333-the studies were done after patient received blood transfusion.  At discharge, hemoglobin was 7.7.   04/01/2021 Initial Diagnosis   04/01/2020, endometrial biopsy showed poorly differentiated endometrial adenocarcinoma. Omniseq NGS showed TMB 48.4 mt/mb [high], MSI High, PD-L1-TPS <1%, PIK3CA H1047Q, Negative for BRAF, HER2, NTRK1 RET.     04/10/2021 Cancer Staging   Staging form: Corpus Uteri - Carcinoma and Carcinosarcoma, AJCC 8th Edition - Clinical stage from 04/10/2021: FIGO Stage III, calculated as Stage Unknown (cT3, cNX, cM0) - Signed by Rickard Patience, MD on 08/24/2021 Stage prefix: Initial diagnosis   04/15/2021 Imaging  PET showed 1. Large hypermetabolic endometrial mass consistent with known endometrial cancer. Suspect direct invasion/involvement of the right adnexa. Right pelvic sidewall hypermetabolic adenopathy.2. Enlarged and hypermetabolic left adrenal gland lesion could reflect a lipid poor hyperfunctioning adenoma but  metastasis is also possible. 3. No findings for metastatic disease involving the chest or bonystructures.     04/24/2021 - 05/29/2021 Radiation Therapy   status post radiation to pelvis   08/05/2021 Imaging   MRI abdomen w wo contrast 1. Stable solid enhancing 2.7 cm left adrenal lesion, which was hypermetabolic on prior PET/CTs but is unchanged in size dating back to December 12, 2020. 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 However, unfortunately metastatic disease can not be entirely excluded and remains a pertinent differential consideration. Comparison with more remote prior imaging would be the most valuable tool in the assessment of this lesion, as demonstrating long-term stability would indicate this to be a benign lesion. However, if no prior imaging can be made available, would consider follow-up adrenal protocol CT with and without intravenous contrast material in 3 months as this would allow for further assessment of stability as well as enhancement and washout characteristics of the lesion potentially allowing for better characterization versus direct tissue sampling.2. Hepatomegaly and hepatic steatosis.3. Colonic diverticulosis without findings of acute diverticulitis    08/16/2021 - 09/27/2021 Chemotherapy   UTERINE Carboplatin AUC 5 / Paclitaxel q21d x 3      10/03/2021 Imaging   PET 1. No residual hypermetabolism in the uterus suggesting an excellent response to treatment. No findings for metastatic disease. 2. Resolution of hypermetabolism in the left adrenal gland lesion suggesting this was metastatic disease. 3. Diffuse marrow hypermetabolism likely due to rebound from chemotherapy or marrow stimulating drugs   10/18/2021 - 11/08/2021 Chemotherapy   AUC 5 / Paclitaxel/ Keytruda Q21d  x 2 cycles   11/29/2021 -   Chemotherapy   Patient is on Treatment Plan : UTERINE Pembrolizumab (200) q21d     04/30/2022 Imaging   CT chest abdomen pelvis w contrast  1. Similar to minimal decrease in size of a left adrenal nodule which was felt suspicious for metastatic disease on 10/03/2021 PET. 2. No evidence of new or progressive disease. 3. Coronary artery atherosclerosis. Aortic Atherosclerosis (ICD10-I70.0). 4. Incidental findings, including: Hepatomegaly. Possible constipation.   08/28/2022 Imaging   CT chest abdomen pelvis w contrast showed 1. Similar appearance of indeterminate left adrenal nodule. 2. Otherwise, no evidence of metastatic disease. 3. Hepatomegaly 4.  Possible constipation. 5. Coronary artery atherosclerosis. Aortic Atherosclerosis (ICD10-I70.0).   12/22/2022 Imaging   CT chest abdomen pelvis w contrast showed 1. Stable indeterminate left adrenal nodule. 2. Otherwise, no noncontrast enhanced CT evidence of metastatic disease. 3. Colonic diverticulosis without findings of acute diverticulitis. 4.  Aortic Atherosclerosis (ICD10-I70.0).     04/17/2023 Imaging   CT chest abdomen pelvis wo contrast  1. Similar size of the indeterminate left adrenal nodule which is unchanged back to 06/10/2021. Felt unlikely to represent metastatic disease. Recommend attention on follow-up. 2. Otherwise, no evidence of metastatic disease. 3. Aortic valvular calcifications. Consider echocardiography to evaluate for valvular dysfunction. 4.  Aortic Atherosclerosis (ICD10-I70.0).       INTERVAL HISTORY Valerie Case is a 73 y.o. female who has above history reviewed by me today presents for follow up visit for anemia and endometrial cancer Overall she  tolerates treatment She reports having 4 days diarrhea after she started on PPI. She stopped the medication and diarrhea has resolved.  Today Patient denies SOB,  nausea vomiting diarrhea.   Review of Systems  Constitutional:  Positive for fatigue.  Negative for chills and fever.  HENT:   Negative for hearing loss and voice change.   Eyes:  Negative for eye problems.  Respiratory:  Negative for chest tightness and cough.   Cardiovascular:  Negative for chest pain.  Gastrointestinal:  Negative for abdominal distention, abdominal pain and blood in stool.  Endocrine: Negative for hot flashes.  Genitourinary:  Negative for difficulty urinating, frequency and vaginal bleeding.   Musculoskeletal:  Positive for arthralgias.  Skin:  Negative for itching and rash.  Neurological:  Negative for extremity weakness and headaches.  Hematological:  Negative for adenopathy.  Psychiatric/Behavioral:  Negative for confusion.     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   Mixed hyperlipidemia    Peripheral polyneuropathy    Sleep apnea     SURGICAL HISTORY: Past Surgical History:  Procedure Laterality Date   APPENDECTOMY     BIOPSY  03/17/2023   Procedure: BIOPSY;  Surgeon: Jaynie Collins, DO;  Location: [facility] ENDOSCOPY;  Service: Gastroenterology;;   CESAREAN SECTION  01/23/1979   COLONOSCOPY     COLONOSCOPY N/A 03/17/2023   Procedure: COLONOSCOPY;  Surgeon: Jaynie Collins, DO;  Location: [facility] ENDOSCOPY;  Service: Gastroenterology;  Laterality: N/A;   COLONOSCOPY WITH PROPOFOL N/A 03/13/2023   Procedure: COLONOSCOPY WITH PROPOFOL;  Surgeon: Jaynie Collins, DO;  Location: [facility] ENDOSCOPY;  Service: Gastroenterology;  Laterality: N/A;    ESOPHAGOGASTRODUODENOSCOPY     ESOPHAGOGASTRODUODENOSCOPY N/A 03/17/2023   Procedure: ESOPHAGOGASTRODUODENOSCOPY (EGD);  Surgeon: Jaynie Collins, DO;  Location: [facility] ENDOSCOPY;  Service: Gastroenterology;  Laterality: N/A;   ESOPHAGOGASTRODUODENOSCOPY (EGD) WITH PROPOFOL N/A 03/13/2023   Procedure: ESOPHAGOGASTRODUODENOSCOPY (EGD) WITH PROPOFOL;  Surgeon: Jaynie Collins, DO;  Location: [facility] ENDOSCOPY;  Service: Gastroenterology;  Laterality: N/A;   IR IMAGING GUIDED PORT INSERTION  08/09/2021   TONSILLECTOMY  03/17/1955    SOCIAL HISTORY: Social History   Socioeconomic History   Marital status: Divorced    Spouse name: Not on file   Number of children: Not on file   Years of education: Not on file   Highest education level: Not on file  Occupational History   Not on file  Tobacco Use   Smoking status: Never   Smokeless tobacco: Never  Vaping Use   Vaping status: Never Used  Substance and Sexual Activity   Alcohol use: Not Currently   Drug use: Never   Sexual activity: Not Currently  Other Topics Concern   Not on file  Social History Narrative      Social Drivers of Health   Financial Resource Strain: Low Risk  (05/19/2023)   Received from [facility]   Overall Financial Resource Strain (  CARDIA)    Difficulty of Paying Living Expenses: Not hard at all  Food Insecurity: No Food Insecurity (05/19/2023)   Received from [facility]   Hunger Vital Sign    Worried About Running Out of Food in the Last Year: Never true    Ran Out of Food in the Last Year: Never true  Transportation Needs: No Transportation Needs (05/19/2023)   Received from [facility] - Transportation    In the past 12 months, has lack of transportation kept you from medical appointments or from getting medications?: No    Lack of Transportation (Non-Medical): No  Physical Activity: Inactive (09/27/2021)   Exercise Vital Sign    Days  of Exercise per Week: 0 days    Minutes of Exercise per Session: 0 min  Stress: Stress Concern Present (09/27/2021)   [employer] of Occupational Health - Occupational Stress Questionnaire    Feeling of Stress : Rather much  Social Connections: Moderately Integrated (09/27/2021)   Social Connection and Isolation Panel [NHANES]    Frequency of Communication with Friends and Family: Three times a week    Frequency of Social Gatherings with Friends and Family: Three times a week    Attends Religious Services: 1 to 4 times per year    Active Member of Clubs or Organizations: No    Attends Banker Meetings: 1 to 4 times per year    Marital Status: Divorced  Catering manager Violence: Not At Risk (09/27/2021)   Humiliation, Afraid, Rape, and Kick questionnaire    Fear of Current or Ex-Partner: No    Emotionally Abused: No    Physically Abused: No    Sexually Abused: No    FAMILY HISTORY: Family History  Problem Relation Age of Onset   Breast cancer Mother 69   Cancer Mother    Cancer Father    Lung cancer Father    Breast cancer Cousin        dx 56s maternal    ALLERGIES:  is allergic to doxycycline, omeprazole, and aspirin.  MEDICATIONS:  Current Outpatient Medications  Medication Sig Dispense Refill   acetaminophen (TYLENOL) 650 MG CR tablet Take 1,300 mg by mouth every 8 (eight) hours as needed for pain.     albuterol (VENTOLIN HFA) 108 (90 Base) MCG/ACT inhaler Inhale 2 puffs into the lungs every 6 (six) hours as needed for wheezing or shortness of breath.     azelastine (ASTELIN) 0.1 % nasal spray Place 2 sprays into both nostrils daily. Use in each nostril as directed 30 mL 0   Cranberry-Vitamin C-Probiotic (AZO CRANBERRY PO) Take by mouth.     docusate sodium (COLACE) 100 MG capsule Take 1 capsule (100 mg total) by mouth 2 (two) times daily. 60 capsule 1   empagliflozin (JARDIANCE) 25 MG TABS tablet Take 25 mg by mouth daily.     ferrous sulfate 325 (65 FE)  MG EC tablet Take 325 mg by mouth daily with breakfast.     FLUoxetine (PROZAC) 10 MG capsule Take 10 mg by mouth daily.     fluticasone (FLONASE) 50 MCG/ACT nasal spray Place 1 spray into both nostrils daily as needed for allergies or rhinitis.     furosemide (LASIX) 20 MG tablet Take 20 mg by mouth daily.     gabapentin (NEURONTIN) 100 MG capsule Take 1 capsule (100 mg total) by mouth daily. 90 capsule 1   hydrochlorothiazide (HYDRODIURIL) 12.5 MG tablet Take 12.5 mg  by mouth daily.     hydrocortisone 2.5 % cream Apply topically 3 (three) times daily. 30 g 2   ibuprofen (ADVIL) 200 MG tablet Take 400 mg by mouth every 8 (eight) hours as needed for moderate pain.     Insulin Asp Prot & Asp FlexPen (NOVOLOG 70/30 MIX) (70-30) 100 UNIT/ML FlexPen Inject into the skin.     lidocaine-prilocaine (EMLA) cream Apply small amount to port and cover with saran wrap 1-2 hours prior to port access -daily PRN 30 g 3   lisinopril (ZESTRIL) 40 MG tablet Take 40 mg by mouth at bedtime.     loratadine (CLARITIN) 10 MG tablet Take by mouth.     LORazepam (ATIVAN) 0.5 MG tablet Take 1 tablet (0.5 mg total) by mouth every 8 (eight) hours as needed for anxiety (nausea vomiting). 30 tablet 0   metFORMIN (GLUCOPHAGE-XR) 500 MG 24 hr tablet Take 500 mg by mouth at bedtime.     metoprolol succinate (TOPROL-XL) 25 MG 24 hr tablet Take 25 mg by mouth daily.     mupirocin ointment (BACTROBAN) 2 % Apply 1 Application topically 3 (three) times daily. 22 g 0   NOVOLIN 70/30 FLEXPEN (70-30) 100 UNIT/ML KwikPen 50-110 Units See admin instructions. 50 units in the morning, 110 units at bedtime     omeprazole (PRILOSEC) 20 MG capsule Take 20 mg by mouth daily.     OZEMPIC, 1 MG/DOSE, 4 MG/3ML SOPN Inject 1 mg into the skin every Tuesday.     rosuvastatin (CRESTOR) 10 MG tablet Take 10 mg by mouth daily.     traMADol (ULTRAM) 50 MG tablet Take 2 tablets (100 mg total) by mouth every 6 (six) hours as needed. 90 tablet 0    fluconazole (DIFLUCAN) 150 MG tablet Take 1 tablet (150 mg total) by mouth daily. Take 1 tablet day one and repeat 2 days later. (Patient not taking: Reported on 05/22/2023) 2 tablet 0   No current facility-administered medications for this visit.   Facility-Administered Medications Ordered in Other Visits  Medication Dose Route Frequency Provider Last Rate Last Admin   heparin lock flush 100 UNIT/ML injection            pembrolizumab (KEYTRUDA) 200 mg in sodium chloride 0.9 % 50 mL chemo infusion  200 mg Intravenous Once Rickard Patience, MD         PHYSICAL EXAMINATION: ECOG PERFORMANCE STATUS: 1 - Symptomatic but completely ambulatory  Physical Exam Constitutional:      General: She is not in acute distress.    Appearance: She is obese.  HENT:     Head: Normocephalic and atraumatic.  Eyes:     General: No scleral icterus. Cardiovascular:     Rate and Rhythm: Normal rate and regular rhythm.     Heart sounds: Murmur heard.  Pulmonary:     Effort: Pulmonary effort is normal. No respiratory distress.  Abdominal:     General: Bowel sounds are normal. There is no distension.     Palpations: Abdomen is soft.  Musculoskeletal:        General: No deformity. Normal range of motion.     Cervical back: Normal range of motion.     Right lower leg: Edema present.     Left lower leg: Edema present.     Comments:    Skin:    General: Skin is warm and dry.     Findings: No rash.     Comments: Chronic venous sufficiency skin changes  Neurological:     Mental Status: She is alert and oriented to person, place, and time. Mental status is at baseline.     Cranial Nerves: No cranial nerve deficit.     Coordination: Coordination normal.  Psychiatric:        Mood and Affect: Mood normal.    LABORATORY DATA:  I have reviewed the data as listed    Latest Ref Rng & Units 05/22/2023    8:25 AM 04/24/2023   12:48 PM 04/03/2023    9:06 AM  CBC  WBC 4.0 - 10.5 K/uL 6.8  6.8  7.0   Hemoglobin 12.0 -  15.0 g/dL 95.2  84.1  32.4   Hematocrit 36.0 - 46.0 % 36.0  35.9  36.8   Platelets 150 - 400 K/uL 277  298  294       Latest Ref Rng & Units 05/22/2023    8:25 AM 04/24/2023   12:48 PM 04/03/2023    9:06 AM  CMP  Glucose 70 - 99 mg/dL 401  027  253   BUN 8 - 23 mg/dL 32  29  39   Creatinine 0.44 - 1.00 mg/dL 6.64  4.03  4.74   Sodium 135 - 145 mmol/L 135  135  135   Potassium 3.5 - 5.1 mmol/L 4.9  4.2  4.2   Chloride 98 - 111 mmol/L 103  107  106   CO2 22 - 32 mmol/L 21  20  19    Calcium 8.9 - 10.3 mg/dL 9.6  9.0  9.7   Total Protein 6.5 - 8.1 g/dL 6.9  6.9  7.3   Total Bilirubin 0.0 - 1.2 mg/dL 0.4  0.3  0.4   Alkaline Phos 38 - 126 U/L 57  55  58   AST 15 - 41 U/L 20  22  23    ALT 0 - 44 U/L 17  20  20        RADIOGRAPHIC STUDIES: I have personally reviewed the radiological images as listed and agreed with the findings in the report. CT CHEST ABDOMEN PELVIS WO CONTRAST Result Date: 04/27/2023 CLINICAL DATA:  History of endometrial adenocarcinoma. Diagnosed in 2023. Chemotherapy and radiation therapy. Asymptomatic. * Tracking Code: BO * EXAM: CT CHEST, ABDOMEN AND PELVIS WITHOUT CONTRAST TECHNIQUE: Multidetector CT imaging of the chest, abdomen and pelvis was performed following the standard protocol without IV contrast. RADIATION DOSE REDUCTION: This exam was performed according to the departmental dose-optimization program which includes automated exposure control, adjustment of the mA and/or kV according to patient size and/or use of iterative reconstruction technique. COMPARISON:  12/22/2022 FINDINGS: CT CHEST FINDINGS Cardiovascular: Right Port-A-Cath tip high right atrium. Aortic atherosclerosis. Tortuous thoracic aorta. Mild cardiomegaly, without pericardial effusion. Three vessel coronary artery calcification. Aortic valve calcification. Mediastinum/Nodes: No supraclavicular adenopathy. No mediastinal or hilar adenopathy, given limitations of unenhanced CT. Left posterior thyroid  prominence including on 9/2 is unchanged. Lungs/Pleura: No pleural fluid. Mild biapical pleuroparenchymal scarring. Minimal subpleural left lower lobe nodularity, favoring scarring on 97/3. A 2-3 mm left lower lobe nodule on 93/3 is unchanged. This has been present over multiple prior exams and is considered benign. Musculoskeletal: Included within the abdomen pelvic section. CT ABDOMEN PELVIS FINDINGS Hepatobiliary: Normal noncontrast appearance of the liver. Normal gallbladder, without biliary ductal dilatation. Pancreas: Normal, without mass or ductal dilatation. Spleen: Nonspecific hypoattenuating anterior splenic lesion of 1.0 cm is present on the prior and of doubtful clinical significance. Adrenals/Urinary Tract: Normal right adrenal gland. A left adrenal  nodule measures 2.7 x 2.4 cm and 45 HU, similar. No renal calculi or hydronephrosis.    No bladder calculi. Stomach/Bowel: Normal stomach, without wall thickening. Extensive colonic diverticulosis. Normal terminal ileum. Normal small bowel. Vascular/Lymphatic: Aortic atherosclerosis. No abdominopelvic adenopathy. Reproductive: Stable appearance of the uterus, without dominant endometrial mass. No adnexal mass. Other: No significant free fluid. No free intraperitoneal air. No evidence of omental or peritoneal disease. Musculoskeletal: Presumed sebaceous cyst about the upper posterior chest wall, similar. IMPRESSION: 1. Similar size of the indeterminate left adrenal nodule which is unchanged back to 06/10/2021. Felt unlikely to represent metastatic disease. Recommend attention on follow-up. 2. Otherwise, no evidence of metastatic disease. 3. Aortic valvular calcifications. Consider echocardiography to evaluate for valvular dysfunction. 4.  Aortic Atherosclerosis (ICD10-I70.0). Electronically Signed   By: Jeronimo Greaves M.D.   On: 04/27/2023 14:47

## 2023-05-22 NOTE — Assessment & Plan Note (Signed)
lipid poor hyperfunctioning adenoma vs mets.  FDG avid on PET scan.  Difficult biopsy due to patient's body habitus and deep position of the mass. Resolved on PET scan, suggesting that this lesion might be a metastatic lesion. CT showed stable lesion  Continue monitor

## 2023-05-22 NOTE — Assessment & Plan Note (Signed)
 Treatment plan as listed above.

## 2023-05-22 NOTE — Progress Notes (Signed)
 Pt here for follow up, no new concerns voiced. Pt states that she took omeprazole and it gave her a lot of diarrhea. Pt requested addon to allergy list.

## 2023-05-22 NOTE — Assessment & Plan Note (Signed)
Continue gabapentin 100mg  daily.

## 2023-05-22 NOTE — Assessment & Plan Note (Addendum)
 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 s/p carboplatin and Taxol for 5 cycles, on Keytruda maintenance.  Labs are reviewed and discussed with patient.  Proceed with Keytruda CT showed NED/stable disease,stable adrenal nodule

## 2023-05-23 ENCOUNTER — Other Ambulatory Visit: Payer: Self-pay

## 2023-05-23 IMAGING — CR DG ABDOMEN 2V
4 series · 4 of 4 positions shown · non-contrast
Comparison: None.

CLINICAL DATA: Lower abdominal pain

EXAM:
ABDOMEN - 2 VIEW

[abdomen erect (1 of 2)]
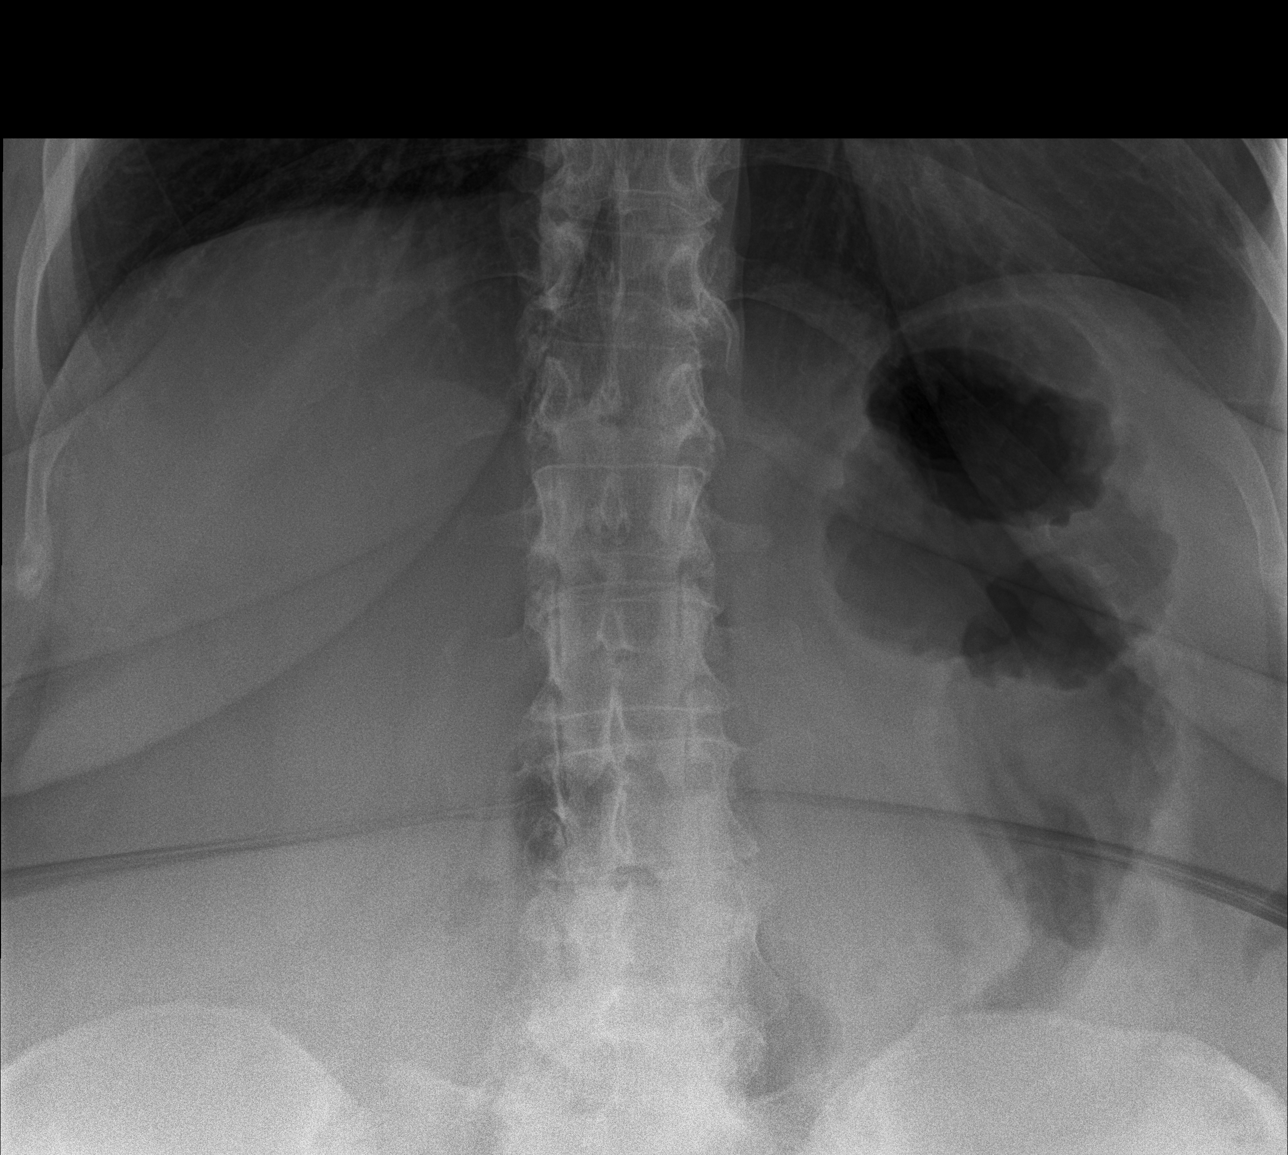

[abdomen erect (2 of 2)]
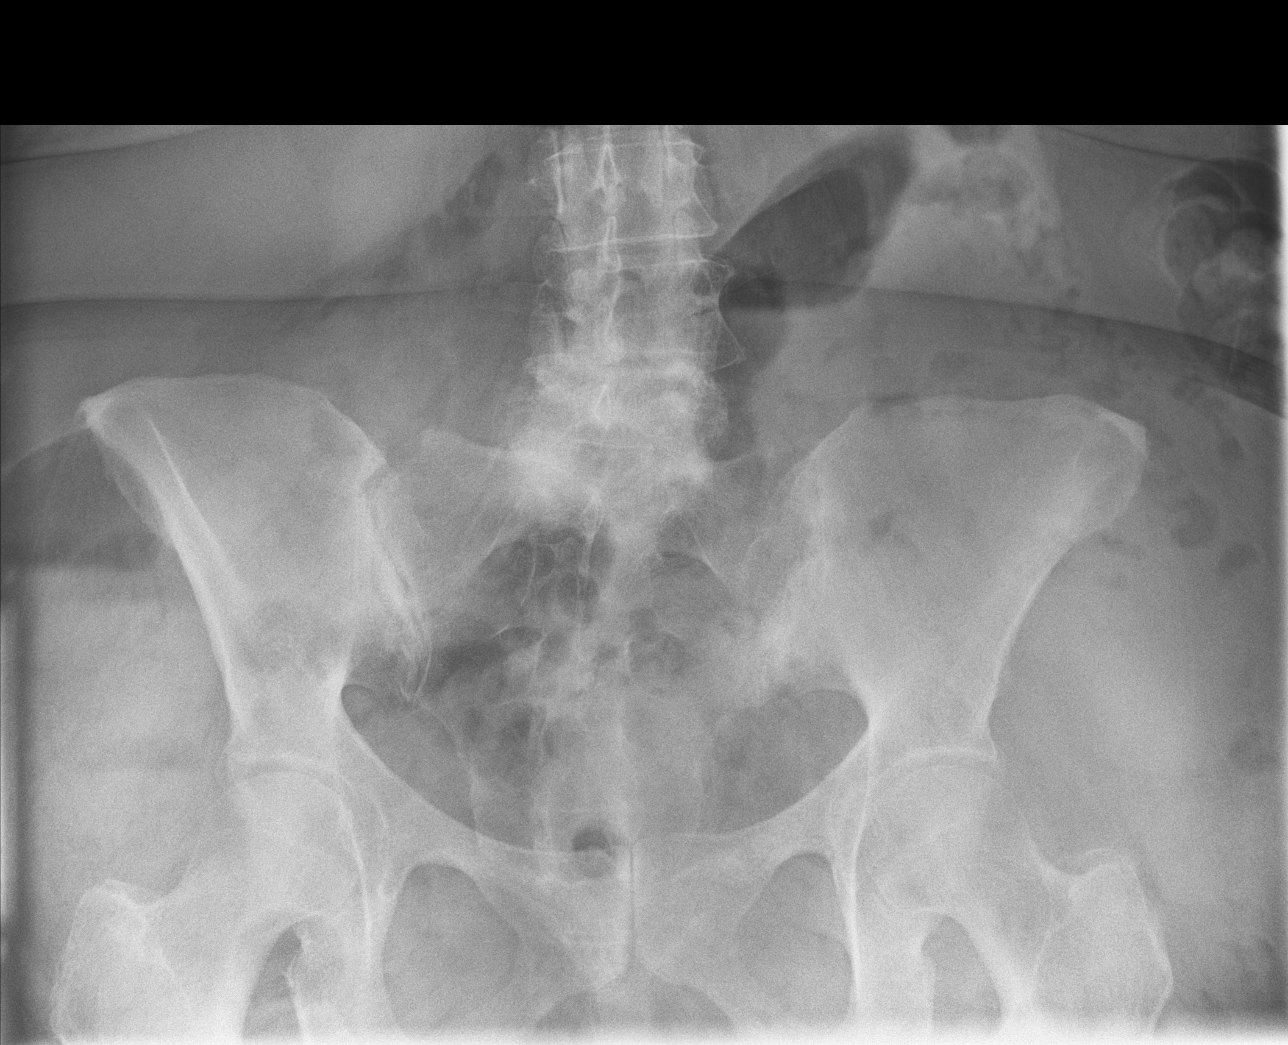

[abdomen supine (1 of 2)]
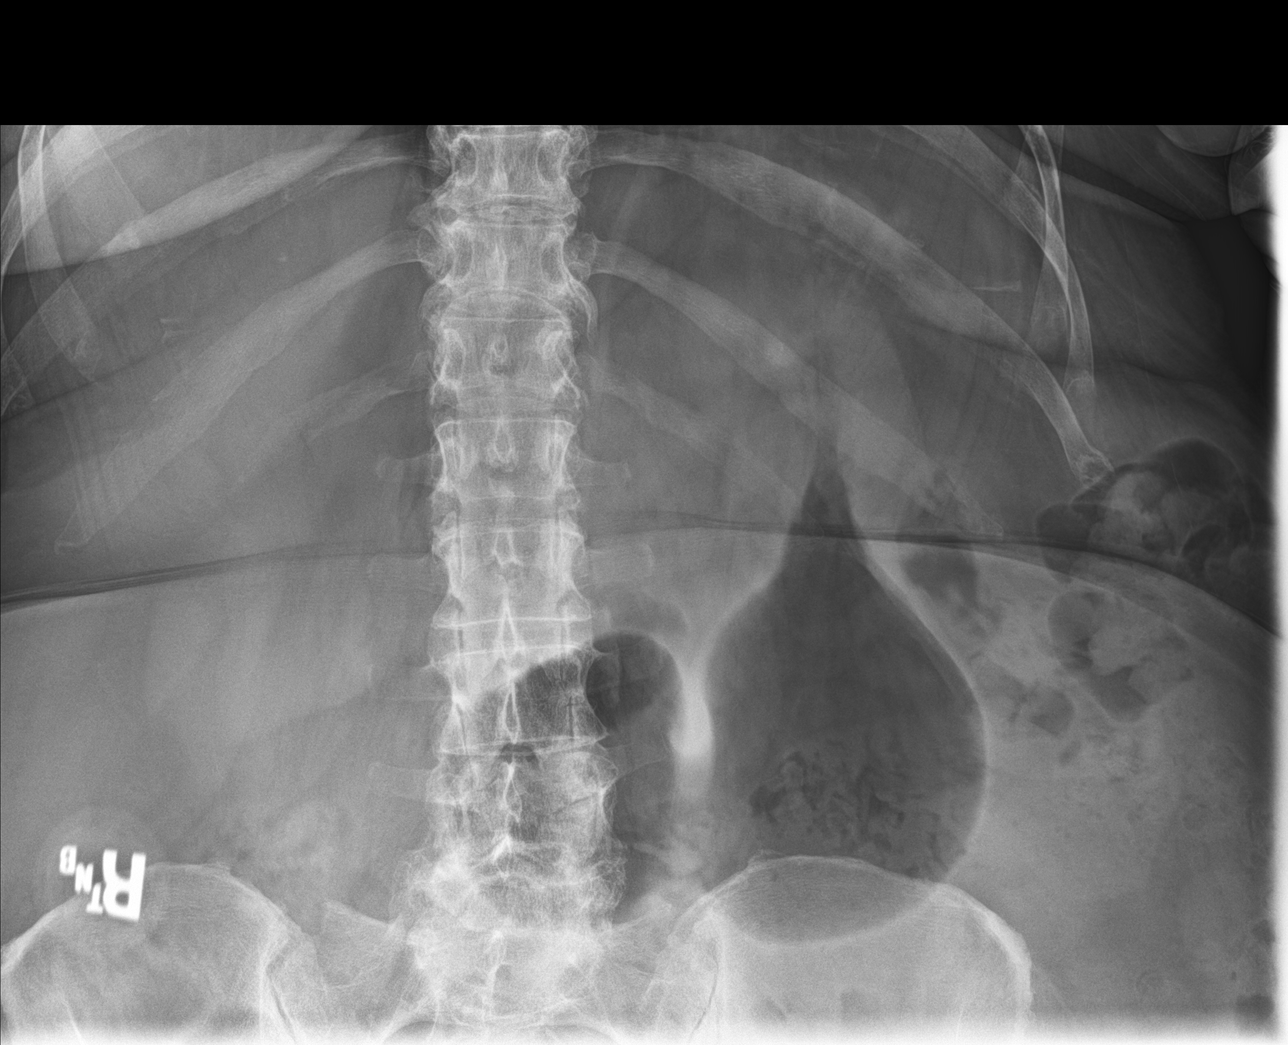

[abdomen supine (2 of 2)]
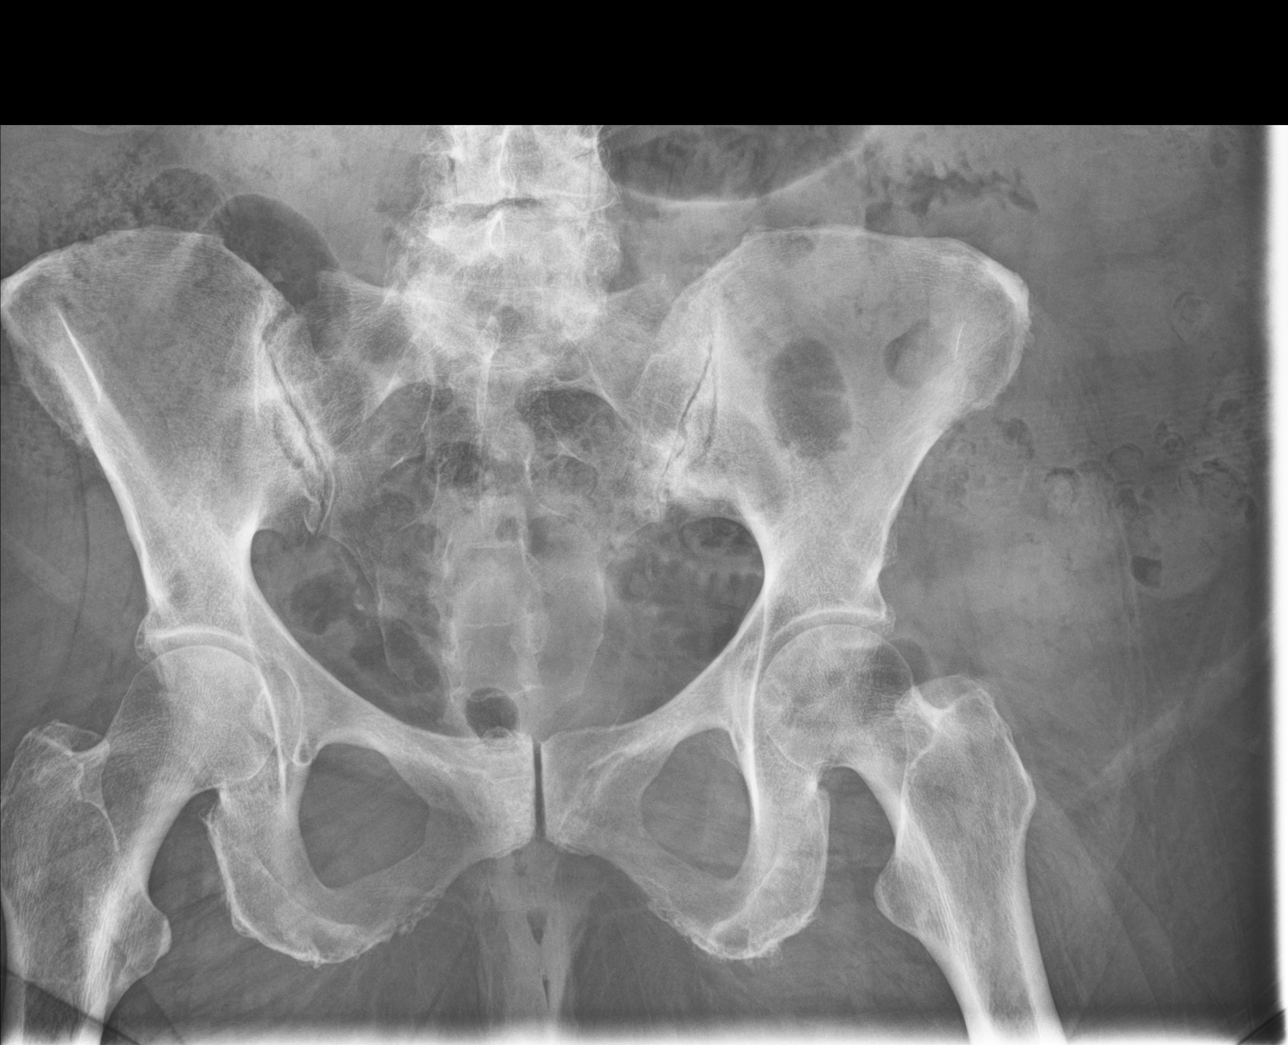

[4 of 4 positions shown; findings below may reference images not displayed]

FINDINGS: Nonobstructed gas pattern with moderate stool in the colon. No free
air beneath the diaphragm. No radiopaque calculi.
IMPRESSION: Negative.

## 2023-05-25 ENCOUNTER — Other Ambulatory Visit: Payer: Self-pay

## 2023-05-28 IMAGING — CT CT ABD-PELV W/ CM
2 of 5 series · 16 of 46 positions shown, 18 images · IV contrast (APPLIED)
Comparison: None.

CLINICAL DATA: Gross hematuria. Lower abdominal pain. Nausea and
vomiting. Diarrhea.

EXAM:
CT ABDOMEN AND PELVIS WITH CONTRAST
TECHNIQUE: Multidetector CT imaging of the abdomen and pelvis was performed
using the standard protocol following bolus administration of
intravenous contrast.
CONTRAST:  100mL OMNIPAQUE IOHEXOL 350 MG/ML SOLN

[Series 2: routine abd/pel with · axial · 0.98mm/px · z∈[-916,-486]mm · 13 of 98 slices shown, 15 images]
[im 6/98  soft-tissue]
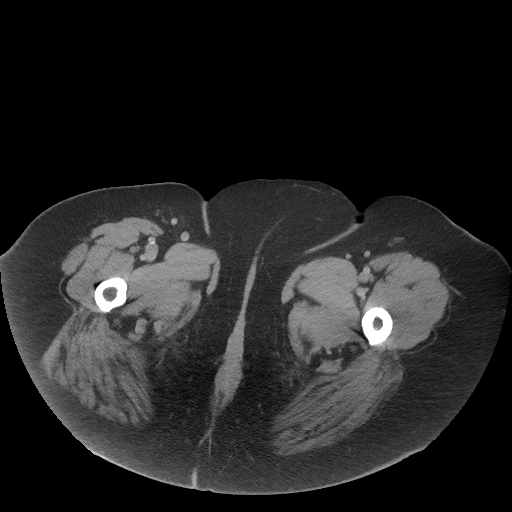
[im 6/98  bone]
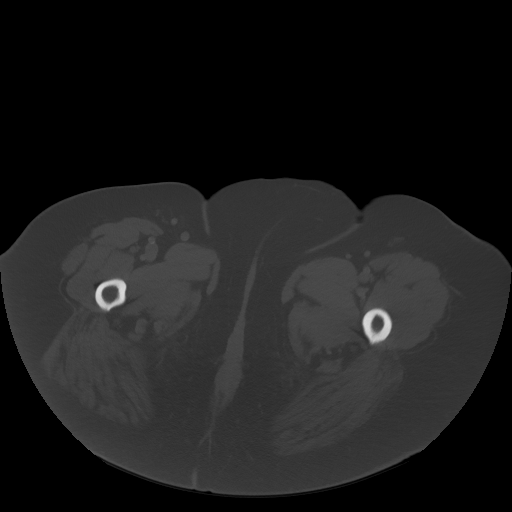
[im 12/98  soft-tissue]
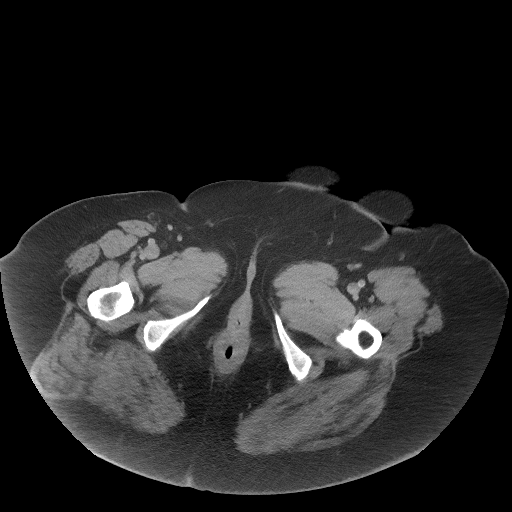
[im 23/98  soft-tissue]
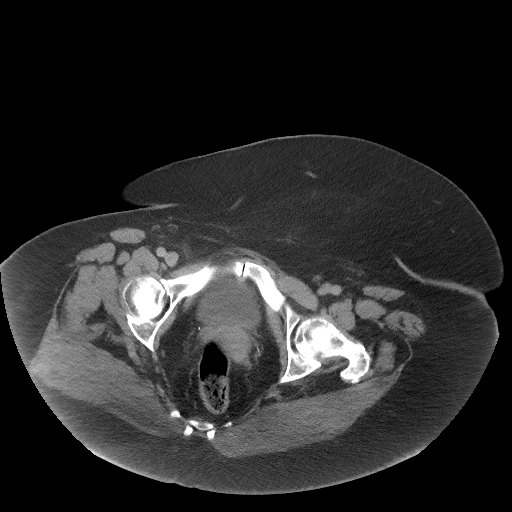
[im 29/98  soft-tissue]
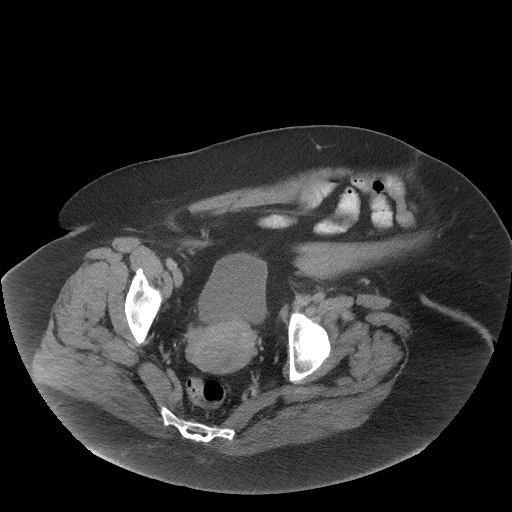
[im 35/98  soft-tissue]
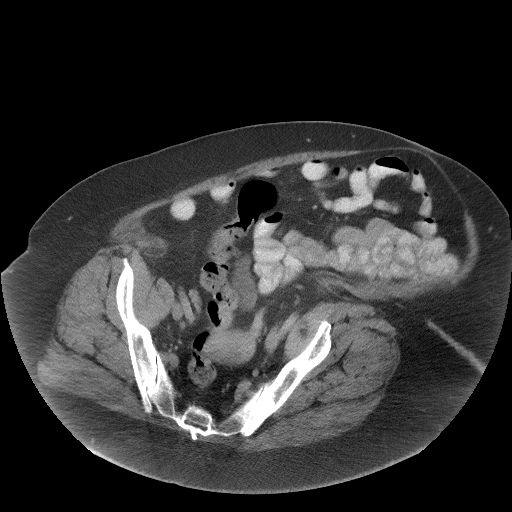
[im 40/98  soft-tissue]
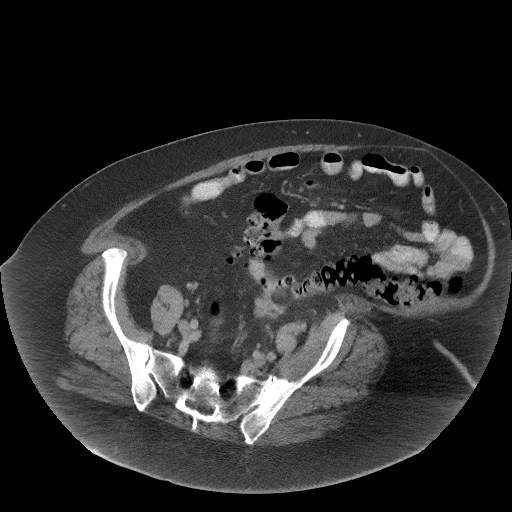
[im 52/98  soft-tissue]
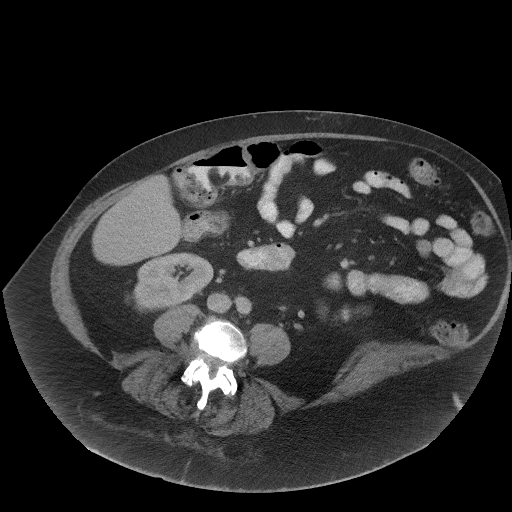
[im 58/98  soft-tissue]
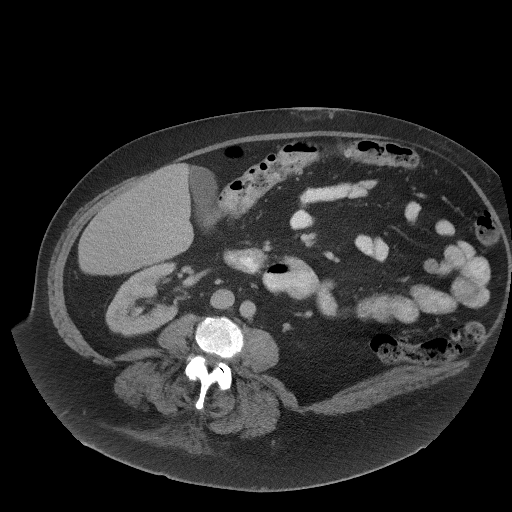
[im 63/98  soft-tissue]
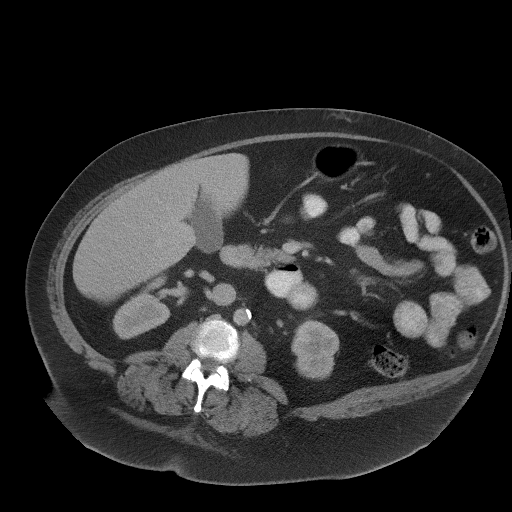
[im 63/98  bone]
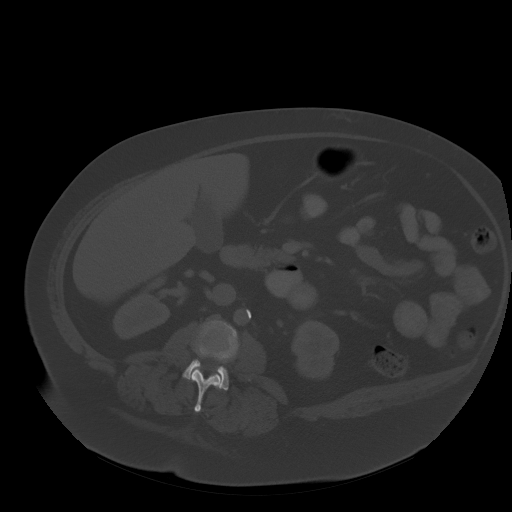
[im 69/98  soft-tissue]
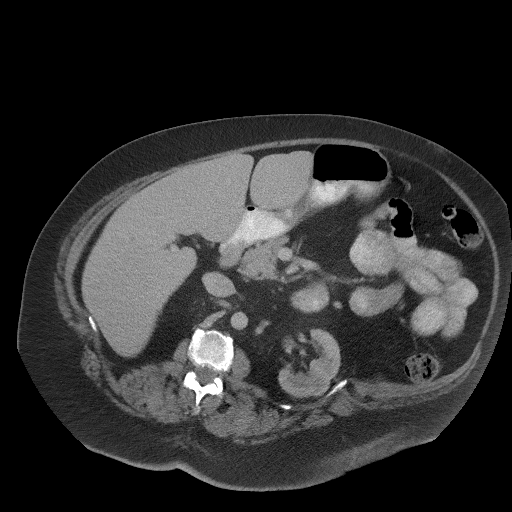
[im 75/98  soft-tissue]
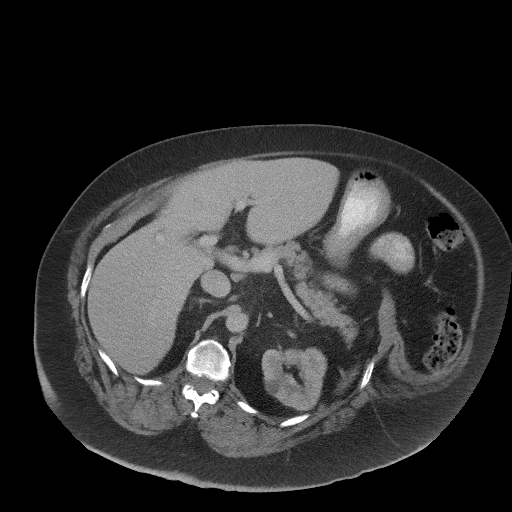
[im 86/98  soft-tissue]
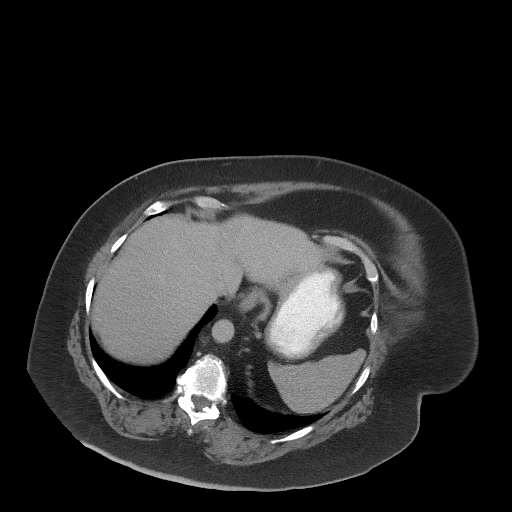
[im 92/98  soft-tissue]
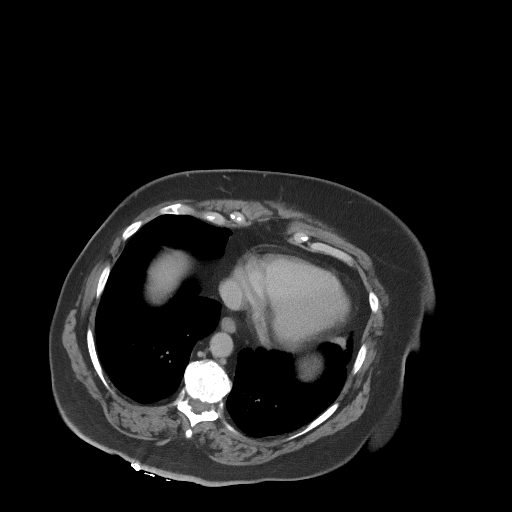

[Series 4: coronal st · coronal · 0.98mm/px · 3 of 134 slices shown]
[im 45/134  soft-tissue]
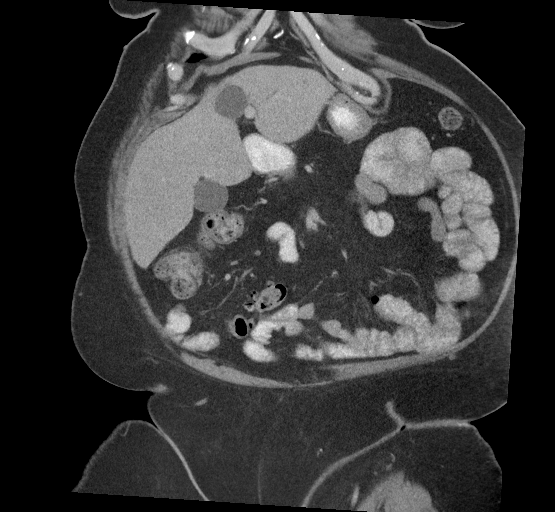
[im 60/134  soft-tissue]
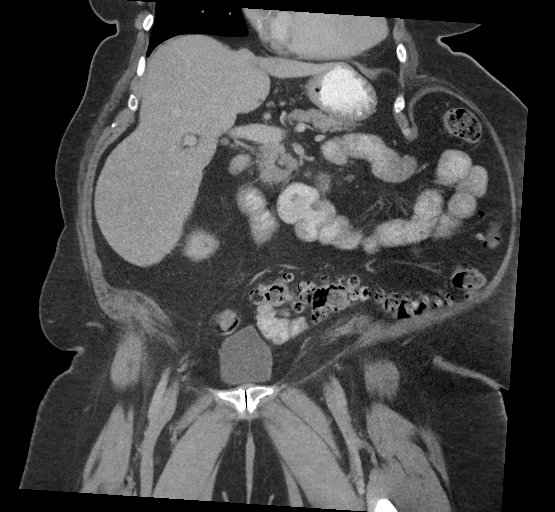
[im 74/134  soft-tissue]
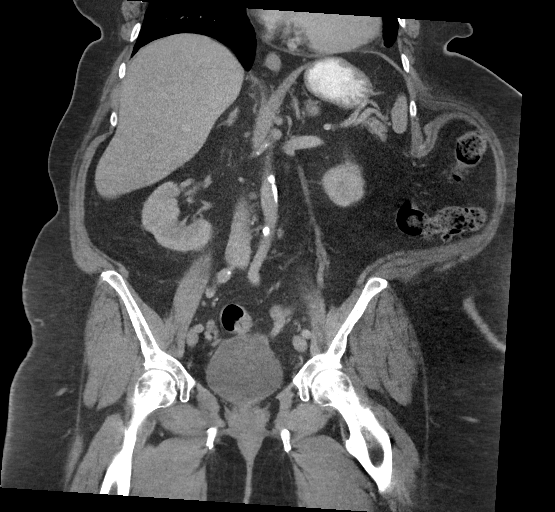

[16 of 46 positions shown; findings below may reference images not displayed]

FINDINGS: Lower Chest: No acute findings.

Hepatobiliary: A few small cysts noted in the left hepatic lobe,
largest measuring 3.5 cm. No hepatic masses identified. Gallbladder
is unremarkable. No evidence of biliary ductal dilatation.

Pancreas:  No mass or inflammatory changes.

Spleen: Within normal limits in size and appearance.

Adrenals/Urinary Tract: 2.6 cm homogeneous left adrenal mass is
seen, which has nonspecific characteristics. Tiny left renal cyst
noted. No renal masses identified. No evidence of ureteral calculi
or hydronephrosis. Unremarkable unopacified urinary bladder.

Stomach/Bowel: No evidence of obstruction, inflammatory process or
abnormal fluid collections. Diffuse colonic diverticulosis is noted,
however there is no evidence of diverticulitis.

Vascular/Lymphatic: No pathologically enlarged lymph nodes. No acute
vascular findings. Aortic atherosclerotic calcification noted.

Reproductive:  No mass or other significant abnormality.

Other:  None.

Musculoskeletal:  No suspicious bone lesions identified.
IMPRESSION: No radiographic evidence of urinary tract neoplasm, calculi, or
hydronephrosis.

2.6 cm nonspecific left adrenal mass. If patient has history of
cancer, recommend further evaluation with adrenal protocol abdomen
CT without and with contrast. If no history of cancer, consider
followup by CT in 12 months. This recommendation follows ACR
consensus guidelines: Management of Incidental Adrenal Masses: A
White Paper of the ACR Incidental Findings Committee. [HOSPITAL] 3817;[id number].

Colonic diverticulosis, without radiographic evidence of
diverticulitis.

Aortic Atherosclerosis ([id number]).

## 2023-06-02 ENCOUNTER — Encounter: Payer: Self-pay | Admitting: Oncology

## 2023-06-05 ENCOUNTER — Other Ambulatory Visit: Payer: Self-pay

## 2023-06-12 ENCOUNTER — Inpatient Hospital Stay

## 2023-06-12 ENCOUNTER — Inpatient Hospital Stay: Admitting: Oncology

## 2023-06-12 ENCOUNTER — Encounter: Payer: Self-pay | Admitting: Oncology

## 2023-06-12 VITALS — BP 113/61 | HR 104 | Temp 98.4°F | Resp 18 | Wt 283.6 lb

## 2023-06-12 DIAGNOSIS — E278 Other specified disorders of adrenal gland: Secondary | ICD-10-CM

## 2023-06-12 DIAGNOSIS — G629 Polyneuropathy, unspecified: Secondary | ICD-10-CM

## 2023-06-12 DIAGNOSIS — C541 Malignant neoplasm of endometrium: Secondary | ICD-10-CM | POA: Diagnosis not present

## 2023-06-12 DIAGNOSIS — D631 Anemia in chronic kidney disease: Secondary | ICD-10-CM

## 2023-06-12 DIAGNOSIS — N189 Chronic kidney disease, unspecified: Secondary | ICD-10-CM

## 2023-06-12 DIAGNOSIS — Z5112 Encounter for antineoplastic immunotherapy: Secondary | ICD-10-CM

## 2023-06-12 LAB — COMPREHENSIVE METABOLIC PANEL WITH GFR
ALT: 20 U/L (ref 0–44)
AST: 23 U/L (ref 15–41)
Albumin: 3.9 g/dL (ref 3.5–5.0)
Alkaline Phosphatase: 61 U/L (ref 38–126)
Anion gap: 10 (ref 5–15)
BUN: 33 mg/dL — ABNORMAL HIGH (ref 8–23)
CO2: 23 mmol/L (ref 22–32)
Calcium: 10.1 mg/dL (ref 8.9–10.3)
Chloride: 107 mmol/L (ref 98–111)
Creatinine, Ser: 1.03 mg/dL — ABNORMAL HIGH (ref 0.44–1.00)
GFR, Estimated: 58 mL/min — ABNORMAL LOW (ref >60)
Glucose, Bld: 174 mg/dL — ABNORMAL HIGH (ref 70–99)
Potassium: 4.1 mmol/L (ref 3.5–5.1)
Sodium: 140 mmol/L (ref 135–145)
Total Bilirubin: 0.5 mg/dL (ref 0.0–1.2)
Total Protein: 7.3 g/dL (ref 6.5–8.1)

## 2023-06-12 LAB — CBC WITH DIFFERENTIAL/PLATELET
Abs Immature Granulocytes: 0.06 10*3/uL (ref 0.00–0.07)
Basophils Absolute: 0.1 10*3/uL (ref 0.0–0.1)
Basophils Relative: 1 %
Eosinophils Absolute: 0.2 10*3/uL (ref 0.0–0.5)
Eosinophils Relative: 4 %
HCT: 36.4 % (ref 36.0–46.0)
Hemoglobin: 11.8 g/dL — ABNORMAL LOW (ref 12.0–15.0)
Immature Granulocytes: 1 %
Lymphocytes Relative: 18 %
Lymphs Abs: 1.2 10*3/uL (ref 0.7–4.0)
MCH: 30.9 pg (ref 26.0–34.0)
MCHC: 32.4 g/dL (ref 30.0–36.0)
MCV: 95.3 fL (ref 80.0–100.0)
Monocytes Absolute: 0.8 10*3/uL (ref 0.1–1.0)
Monocytes Relative: 11 %
Neutro Abs: 4.3 10*3/uL (ref 1.7–7.7)
Neutrophils Relative %: 65 %
Platelets: 303 10*3/uL (ref 150–400)
RBC: 3.82 MIL/uL — ABNORMAL LOW (ref 3.87–5.11)
RDW: 13.1 % (ref 11.5–15.5)
WBC: 6.6 10*3/uL (ref 4.0–10.5)
nRBC: 0 % (ref 0.0–0.2)

## 2023-06-12 MED ORDER — SODIUM CHLORIDE 0.9 % IV SOLN
Freq: Once | INTRAVENOUS | Status: AC
  Filled 2023-06-12: qty 250

## 2023-06-12 MED ORDER — SODIUM CHLORIDE 0.9 % IV SOLN
200.0000 mg | Freq: Once | INTRAVENOUS | Status: AC
  Administered 2023-06-12: 200 mg via INTRAVENOUS
  Filled 2023-06-12: qty 8

## 2023-06-12 MED ORDER — HEPARIN SOD (PORK) LOCK FLUSH 100 UNIT/ML IV SOLN
500.0000 [IU] | Freq: Once | INTRAVENOUS | Status: AC | PRN
  Administered 2023-06-12: 500 [IU]
  Filled 2023-06-12: qty 5

## 2023-06-12 NOTE — Progress Notes (Signed)
Pt here for follow up, no new concerns voiced.  

## 2023-06-12 NOTE — Assessment & Plan Note (Signed)
 Treatment plan as listed above.

## 2023-06-12 NOTE — Assessment & Plan Note (Signed)
 Stable hemoglobin

## 2023-06-12 NOTE — Assessment & Plan Note (Signed)
Continue gabapentin 100mg  daily.

## 2023-06-12 NOTE — Assessment & Plan Note (Signed)
lipid poor hyperfunctioning adenoma vs mets.  FDG avid on PET scan.  Difficult biopsy due to patient's body habitus and deep position of the mass. Resolved on PET scan, suggesting that this lesion might be a metastatic lesion. CT showed stable lesion  Continue monitor

## 2023-06-12 NOTE — Progress Notes (Signed)
 Hematology/Oncology Progress note [phone] [phone]      CHIEF COMPLAINTS/REASON FOR VISIT:  Follow up for endometrial cancer  ASSESSMENT & PLAN:   Cancer Staging  Endometrial adenocarcinoma [facility]) Staging form: Corpus Uteri - Carcinoma and Carcinosarcoma, AJCC 8th Edition - Clinical stage from 04/10/2021: FIGO Stage III, calculated as Stage Unknown (cT3, cNX, cM0) - Signed by Rickard Patience, MD on 08/24/2021   Adrenal mass (HCC) lipid poor hyperfunctioning adenoma vs mets.  FDG avid on PET scan.  Difficult biopsy due to patient's body habitus and deep position of the mass. Resolved on PET scan, suggesting that this lesion might be a metastatic lesion. CT showed stable lesion  Continue monitor   Anemia in chronic kidney disease (CKD) Stable hemoglobin  Encounter for antineoplastic immunotherapy Treatment plan as listed above.   Neuropathy Continue  gabapentin 100mg  daily    Orders Placed This Encounter  Procedures   CBC with Differential    Standing Status:   Future    Expected Date:   07/24/2023    Expiration Date:   07/23/2024   Comprehensive metabolic panel    Standing Status:   Future    Expected Date:   07/24/2023    Expiration Date:   07/23/2024   TSH    Standing Status:   Future    Expected Date:   07/24/2023    Expiration Date:   07/23/2024    Return of visit:  3 weeks Lab MD Greggory Brandy, MD, [name] [facility] Hematology Oncology 06/12/2023     HISTORY OF PRESENTING ILLNESS:   Valerie Case is a  73 y.o.  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 Oncology History  Endometrial adenocarcinoma (HCC)  12/12/2020 Imaging   12/12/2020, CT abdomen pelvis with contrast showed no radiographic evidence of urinary tract neoplasm, calculi, hydronephrosis.  2.6 cm nonspecific left adrenal mass. 02/12/2021 CT hematuria work-up showed small uterine fibroids, no findings to explain  hematuria.Hepatomegaly, diverticulosis without evidence of diverticulitis, Coronary artery disease   04/01/2021 -  Hospital Admission   04/01/2021 - 04/03/2021, patient was hospitalized due to symptomatic anemia, hemoglobin 6.9, heavy postmenopausal bleeding with passing large clots.  She also presented with increased creatinine level to 2.58 compared to her baseline level of 0.9 in November 2022.  Patient received IV iron infusion and 2 units of PRBC during the hospital stay..  04/02/2021, iron panel showed iron saturation 37, ferritin 37, TIBC 333-the studies were done after patient received blood transfusion.  At discharge, hemoglobin was 7.7.   04/01/2021 Initial Diagnosis   04/01/2020, endometrial biopsy showed poorly differentiated endometrial adenocarcinoma. Omniseq NGS showed TMB 48.4 mt/mb [high], MSI High, PD-L1-TPS <1%, PIK3CA H1047Q, Negative for BRAF, HER2, NTRK1 RET.     04/10/2021 Cancer Staging   Staging form: Corpus Uteri - Carcinoma and Carcinosarcoma, AJCC 8th Edition - Clinical stage from 04/10/2021: FIGO Stage III, calculated as Stage Unknown (cT3, cNX, cM0) - Signed by Rickard Patience, MD on 08/24/2021 Stage prefix: Initial diagnosis   04/15/2021 Imaging   PET showed 1. Large hypermetabolic endometrial mass consistent with known endometrial cancer. Suspect direct invasion/involvement of the right adnexa. Right pelvic sidewall hypermetabolic adenopathy.2. 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 3. No findings for metastatic disease involving the chest or bonystructures.     04/24/2021 - 05/29/2021 Radiation Therapy   status post radiation to pelvis  08/05/2021 Imaging   MRI abdomen w wo contrast 1. Stable solid enhancing 2.7 cm left adrenal lesion, which was hypermetabolic on prior PET/CTs but is unchanged in size dating back to December 12, 2020. While the imaging characteristics are again  nonspecific, given its relative stability since September 2022 and the relative rarity of isolated adrenal metastases this is favored to reflect a lipid poor adenoma. However, unfortunately metastatic disease can not be entirely excluded and remains a pertinent differential consideration. Comparison with more remote prior imaging would be the most valuable tool in the assessment of this lesion, as demonstrating long-term stability would indicate this to be a benign lesion. However, if no prior imaging can be made available, would consider follow-up adrenal protocol CT with and without intravenous contrast material in 3 months as this would allow for further assessment of stability as well as enhancement and washout characteristics of the lesion potentially allowing for better characterization versus direct tissue sampling.2. Hepatomegaly and hepatic steatosis.3. Colonic diverticulosis without findings of acute diverticulitis    08/16/2021 - 09/27/2021 Chemotherapy   UTERINE Carboplatin AUC 5 / Paclitaxel q21d x 3      10/03/2021 Imaging   PET 1. No residual hypermetabolism in the uterus suggesting an excellent response to treatment. No findings for metastatic disease. 2. Resolution of hypermetabolism in the left adrenal gland lesion suggesting this was metastatic disease. 3. Diffuse marrow hypermetabolism likely due to rebound from chemotherapy or marrow stimulating drugs   10/18/2021 - 11/08/2021 Chemotherapy   AUC 5 / Paclitaxel/ Keytruda Q21d  x 2 cycles   11/29/2021 -  Chemotherapy   Patient is on Treatment Plan : UTERINE Pembrolizumab (200) q21d     04/30/2022 Imaging   CT chest abdomen pelvis w contrast  1. Similar to minimal decrease in size of a left adrenal nodule which was felt suspicious for metastatic disease on 10/03/2021 PET. 2. No evidence of new or progressive disease. 3. Coronary artery atherosclerosis. Aortic Atherosclerosis (ICD10-I70.0). 4. Incidental findings, including:  Hepatomegaly. Possible constipation.   08/28/2022 Imaging   CT chest abdomen pelvis w contrast showed 1. Similar appearance of indeterminate left adrenal nodule. 2. Otherwise, no evidence of metastatic disease. 3. Hepatomegaly 4.  Possible constipation. 5. Coronary artery atherosclerosis. Aortic Atherosclerosis (ICD10-I70.0).   12/22/2022 Imaging   CT chest abdomen pelvis w contrast showed 1. Stable indeterminate left adrenal nodule. 2. Otherwise, no noncontrast enhanced CT evidence of metastatic disease. 3. Colonic diverticulosis without findings of acute diverticulitis. 4.  Aortic Atherosclerosis (ICD10-I70.0).     04/17/2023 Imaging   CT chest abdomen pelvis wo contrast  1. Similar size of the indeterminate left adrenal nodule which is unchanged back to 06/10/2021. Felt unlikely to represent metastatic disease. Recommend attention on follow-up. 2. Otherwise, no evidence of metastatic disease. 3. Aortic valvular calcifications. Consider echocardiography to evaluate for valvular dysfunction. 4.  Aortic Atherosclerosis (ICD10-I70.0).       INTERVAL HISTORY Valerie Case is a 73 y.o. female who has above history reviewed by me today presents for follow up visit for anemia and endometrial cancer Overall she tolerates treatment Today Patient denies SOB,  nausea vomiting diarrhea.   Review of Systems  Constitutional:  Positive for fatigue. Negative for chills and fever.  HENT:   Negative for hearing loss and voice change.   Eyes:  Negative for eye problems.  Respiratory:  Negative for chest tightness and cough.   Cardiovascular:  Negative for chest pain.  Gastrointestinal:  Negative for abdominal distention, abdominal pain and  blood in stool.  Endocrine: Negative for hot flashes.  Genitourinary:  Negative for difficulty urinating, frequency and vaginal bleeding.   Musculoskeletal:  Positive for arthralgias.  Skin:  Negative for itching and rash.  Neurological:  Negative  for extremity weakness and headaches.  Hematological:  Negative for adenopathy.  Psychiatric/Behavioral:  Negative for confusion.     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   Mixed hyperlipidemia    Peripheral polyneuropathy    Sleep apnea     SURGICAL HISTORY: Past Surgical History:  Procedure Laterality Date   APPENDECTOMY     BIOPSY  03/17/2023   Procedure: BIOPSY;  Surgeon: Jaynie Collins, DO;  Location: [facility] ENDOSCOPY;  Service: Gastroenterology;;   CESAREAN SECTION  01/23/1979   COLONOSCOPY     COLONOSCOPY N/A 03/17/2023   Procedure: COLONOSCOPY;  Surgeon: Jaynie Collins, DO;  Location: [facility] ENDOSCOPY;  Service: Gastroenterology;  Laterality: N/A;   COLONOSCOPY WITH PROPOFOL N/A 03/13/2023   Procedure: COLONOSCOPY WITH PROPOFOL;  Surgeon: Jaynie Collins, DO;  Location: [facility] ENDOSCOPY;  Service: Gastroenterology;  Laterality: N/A;   ESOPHAGOGASTRODUODENOSCOPY     ESOPHAGOGASTRODUODENOSCOPY N/A 03/17/2023   Procedure: ESOPHAGOGASTRODUODENOSCOPY (EGD);  Surgeon: Jaynie Collins, DO;  Location: [facility] ENDOSCOPY;  Service: Gastroenterology;  Laterality: N/A;   ESOPHAGOGASTRODUODENOSCOPY (EGD) WITH PROPOFOL N/A 03/13/2023   Procedure: ESOPHAGOGASTRODUODENOSCOPY (EGD) WITH PROPOFOL;  Surgeon: Jaynie Collins, DO;  Location: [facility] ENDOSCOPY;  Service: Gastroenterology;  Laterality: N/A;   IR IMAGING GUIDED PORT INSERTION  08/09/2021   TONSILLECTOMY  03/17/1955    SOCIAL  HISTORY: Social History   Socioeconomic History   Marital status: Divorced    Spouse name: Not on file   Number of children: Not on file   Years of education: Not on file   Highest education level: Not on file  Occupational History   Not on file  Tobacco Use   Smoking status: Never   Smokeless tobacco: Never  Vaping Use   Vaping status: Never Used  Substance and Sexual Activity   Alcohol use: Not Currently   Drug use: Never   Sexual activity: Not Currently  Other Topics Concern   Not on file  Social History Narrative      Social Drivers of Health   Financial Resource Strain: Low Risk  (05/19/2023)   Received from [facility]   Overall Financial Resource Strain (CARDIA)    Difficulty of Paying Living Expenses: Not hard at all  Food Insecurity: No Food Insecurity (05/19/2023)   Received from [facility]   Hunger Vital Sign    Worried About Running Out of Food in the Last Year: Never true    Ran Out of Food in the Last Year: Never true  Transportation Needs: No Transportation Needs (05/19/2023)   Received from [facility] - Transportation    In the past 12 months, has lack of  transportation kept you from medical appointments or from getting medications?: No    Lack of Transportation (Non-Medical): No  Physical Activity: Inactive (09/27/2021)   Exercise Vital Sign    Days of Exercise per Week: 0 days    Minutes of Exercise per Session: 0 min  Stress: Stress Concern Present (09/27/2021)   [employer] of Occupational Health - Occupational Stress Questionnaire    Feeling of Stress : Rather much  Social Connections: Moderately Integrated (09/27/2021)   Social Connection and Isolation Panel [NHANES]    Frequency of Communication with Friends and Family: Three times a week    Frequency of Social Gatherings with Friends and Family: Three times a week    Attends Religious Services: 1 to 4 times per year    Active  Member of Clubs or Organizations: No    Attends Banker Meetings: 1 to 4 times per year    Marital Status: Divorced  Catering manager Violence: Not At Risk (09/27/2021)   Humiliation, Afraid, Rape, and Kick questionnaire    Fear of Current or Ex-Partner: No    Emotionally Abused: No    Physically Abused: No    Sexually Abused: No    FAMILY HISTORY: Family History  Problem Relation Age of Onset   Breast cancer Mother 62   Cancer Mother    Cancer Father    Lung cancer Father    Breast cancer Cousin        dx 69s maternal    ALLERGIES:  is allergic to doxycycline, omeprazole, and aspirin.  MEDICATIONS:  Current Outpatient Medications  Medication Sig Dispense Refill   acetaminophen (TYLENOL) 650 MG CR tablet Take 1,300 mg by mouth every 8 (eight) hours as needed for pain.     albuterol (VENTOLIN HFA) 108 (90 Base) MCG/ACT inhaler Inhale 2 puffs into the lungs every 6 (six) hours as needed for wheezing or shortness of breath.     azelastine (ASTELIN) 0.1 % nasal spray Place 2 sprays into both nostrils daily. Use in each nostril as directed 30 mL 0   Cranberry-Vitamin C-Probiotic (AZO CRANBERRY PO) Take by mouth.     docusate sodium (COLACE) 100 MG capsule Take 1 capsule (100 mg total) by mouth 2 (two) times daily. 60 capsule 1   empagliflozin (JARDIANCE) 25 MG TABS tablet Take 25 mg by mouth daily.     ferrous sulfate 325 (65 FE) MG EC tablet Take 325 mg by mouth daily with breakfast.     FLUoxetine (PROZAC) 10 MG capsule Take 10 mg by mouth daily.     fluticasone (FLONASE) 50 MCG/ACT nasal spray Place 1 spray into both nostrils daily as needed for allergies or rhinitis.     furosemide (LASIX) 20 MG tablet Take 20 mg by mouth daily.     gabapentin (NEURONTIN) 100 MG capsule Take 1 capsule (100 mg total) by mouth daily. 90 capsule 1   hydrochlorothiazide (HYDRODIURIL) 12.5 MG tablet Take 12.5 mg by mouth daily.     hydrocortisone 2.5 % cream Apply topically 3 (three)  times daily. 30 g 2   ibuprofen (ADVIL) 200 MG tablet Take 400 mg by mouth every 8 (eight) hours as needed for moderate pain.     Insulin Asp Prot & Asp FlexPen (NOVOLOG 70/30 MIX) (70-30) 100 UNIT/ML FlexPen Inject into the skin.     lidocaine-prilocaine (EMLA) cream Apply small amount to port and cover with saran wrap 1-2 hours prior to port access -daily PRN 30 g 3  lisinopril (ZESTRIL) 40 MG tablet Take 40 mg by mouth at bedtime.     loratadine (CLARITIN) 10 MG tablet Take by mouth.     LORazepam (ATIVAN) 0.5 MG tablet Take 1 tablet (0.5 mg total) by mouth every 8 (eight) hours as needed for anxiety (nausea vomiting). 30 tablet 0   metFORMIN (GLUCOPHAGE-XR) 500 MG 24 hr tablet Take 500 mg by mouth at bedtime.     metoprolol succinate (TOPROL-XL) 25 MG 24 hr tablet Take 25 mg by mouth daily.     mupirocin ointment (BACTROBAN) 2 % Apply 1 Application topically 3 (three) times daily. 22 g 0   NOVOLIN 70/30 FLEXPEN (70-30) 100 UNIT/ML KwikPen 50-110 Units See admin instructions. 50 units in the morning, 110 units at bedtime     OZEMPIC, 1 MG/DOSE, 4 MG/3ML SOPN Inject 1 mg into the skin every Tuesday.     rosuvastatin (CRESTOR) 10 MG tablet Take 10 mg by mouth daily.     traMADol (ULTRAM) 50 MG tablet Take 2 tablets (100 mg total) by mouth every 6 (six) hours as needed. 90 tablet 0   fluconazole (DIFLUCAN) 150 MG tablet Take 1 tablet (150 mg total) by mouth daily. Take 1 tablet day one and repeat 2 days later. (Patient not taking: Reported on 06/12/2023) 2 tablet 0   omeprazole (PRILOSEC) 20 MG capsule Take 20 mg by mouth daily. (Patient not taking: Reported on 06/12/2023)     No current facility-administered medications for this visit.   Facility-Administered Medications Ordered in Other Visits  Medication Dose Route Frequency Provider Last Rate Last Admin   heparin lock flush 100 UNIT/ML injection              PHYSICAL EXAMINATION: ECOG PERFORMANCE STATUS: 1 - Symptomatic but completely  ambulatory  Physical Exam Constitutional:      General: She is not in acute distress.    Appearance: She is obese.  HENT:     Head: Normocephalic and atraumatic.  Eyes:     General: No scleral icterus. Cardiovascular:     Rate and Rhythm: Normal rate and regular rhythm.     Heart sounds: Murmur heard.  Pulmonary:     Effort: Pulmonary effort is normal. No respiratory distress.  Abdominal:     General: Bowel sounds are normal. There is no distension.     Palpations: Abdomen is soft.  Musculoskeletal:        General: No deformity. Normal range of motion.     Cervical back: Normal range of motion.     Right lower leg: Edema present.     Left lower leg: Edema present.     Comments:    Skin:    General: Skin is warm and dry.     Findings: No rash.     Comments: Chronic venous sufficiency skin changes  Neurological:     Mental Status: She is alert and oriented to person, place, and time. Mental status is at baseline.     Cranial Nerves: No cranial nerve deficit.     Coordination: Coordination normal.  Psychiatric:        Mood and Affect: Mood normal.    LABORATORY DATA:  I have reviewed the data as listed    Latest Ref Rng & Units 06/12/2023    8:05 AM 05/22/2023    8:25 AM 04/24/2023   12:48 PM  CBC  WBC 4.0 - 10.5 K/uL 6.6  6.8  6.8   Hemoglobin 12.0 - 15.0 g/dL 11.8  11.7  11.5   Hematocrit 36.0 - 46.0 % 36.4  36.0  35.9   Platelets 150 - 400 K/uL 303  277  298       Latest Ref Rng & Units 06/12/2023    8:05 AM 05/22/2023    8:25 AM 04/24/2023   12:48 PM  CMP  Glucose 70 - 99 mg/dL 782  956  213   BUN 8 - 23 mg/dL 33  32  29   Creatinine 0.44 - 1.00 mg/dL 0.86  5.78  4.69   Sodium 135 - 145 mmol/L 140  135  135   Potassium 3.5 - 5.1 mmol/L 4.1  4.9  4.2   Chloride 98 - 111 mmol/L 107  103  107   CO2 22 - 32 mmol/L 23  21  20    Calcium 8.9 - 10.3 mg/dL 62.9  9.6  9.0   Total Protein 6.5 - 8.1 g/dL 7.3  6.9  6.9   Total Bilirubin 0.0 - 1.2 mg/dL 0.5  0.4  0.3    Alkaline Phos 38 - 126 U/L 61  57  55   AST 15 - 41 U/L 23  20  22    ALT 0 - 44 U/L 20  17  20        RADIOGRAPHIC STUDIES: I have personally reviewed the radiological images as listed and agreed with the findings in the report. CT CHEST ABDOMEN PELVIS WO CONTRAST Result Date: 04/27/2023 CLINICAL DATA:  History of endometrial adenocarcinoma. Diagnosed in 2023. Chemotherapy and radiation therapy. Asymptomatic. * Tracking Code: BO * EXAM: CT CHEST, ABDOMEN AND PELVIS WITHOUT CONTRAST TECHNIQUE: Multidetector CT imaging of the chest, abdomen and pelvis was performed following the standard protocol without IV contrast. RADIATION DOSE REDUCTION: This exam was performed according to the departmental dose-optimization program which includes automated exposure control, adjustment of the mA and/or kV according to patient size and/or use of iterative reconstruction technique. COMPARISON:  12/22/2022 FINDINGS: CT CHEST FINDINGS Cardiovascular: Right Port-A-Cath tip high right atrium. Aortic atherosclerosis. Tortuous thoracic aorta. Mild cardiomegaly, without pericardial effusion. Three vessel coronary artery calcification. Aortic valve calcification. Mediastinum/Nodes: No supraclavicular adenopathy. No mediastinal or hilar adenopathy, given limitations of unenhanced CT. Left posterior thyroid prominence including on 9/2 is unchanged. Lungs/Pleura: No pleural fluid. Mild biapical pleuroparenchymal scarring. Minimal subpleural left lower lobe nodularity, favoring scarring on 97/3. A 2-3 mm left lower lobe nodule on 93/3 is unchanged. This has been present over multiple prior exams and is considered benign. Musculoskeletal: Included within the abdomen pelvic section. CT ABDOMEN PELVIS FINDINGS Hepatobiliary: Normal noncontrast appearance of the liver. Normal gallbladder, without biliary ductal dilatation. Pancreas: Normal, without mass or ductal dilatation. Spleen: Nonspecific hypoattenuating anterior splenic lesion of  1.0 cm is present on the prior and of doubtful clinical significance. Adrenals/Urinary Tract: Normal right adrenal gland. A left adrenal nodule measures 2.7 x 2.4 cm and 45 HU, similar. No renal calculi or hydronephrosis.    No bladder calculi. Stomach/Bowel: Normal stomach, without wall thickening. Extensive colonic diverticulosis. Normal terminal ileum. Normal small bowel. Vascular/Lymphatic: Aortic atherosclerosis. No abdominopelvic adenopathy. Reproductive: Stable appearance of the uterus, without dominant endometrial mass. No adnexal mass. Other: No significant free fluid. No free intraperitoneal air. No evidence of omental or peritoneal disease. Musculoskeletal: Presumed sebaceous cyst about the upper posterior chest wall, similar. IMPRESSION: 1. Similar size of the indeterminate left adrenal nodule which is unchanged back to 06/10/2021. Felt unlikely to represent metastatic disease. Recommend attention on follow-up. 2. Otherwise, no evidence of  metastatic disease. 3. Aortic valvular calcifications. Consider echocardiography to evaluate for valvular dysfunction. 4.  Aortic Atherosclerosis (ICD10-I70.0). Electronically Signed   By: Jeronimo Greaves M.D.   On: 04/27/2023 14:47

## 2023-06-13 ENCOUNTER — Other Ambulatory Visit: Payer: Self-pay

## 2023-06-22 ENCOUNTER — Encounter: Payer: Self-pay | Admitting: Radiation Oncology

## 2023-06-22 ENCOUNTER — Ambulatory Visit
Admission: RE | Admit: 2023-06-22 | Discharge: 2023-06-22 | Disposition: A | Discharge status: Home / Self Care | Payer: Medicare PPO | Source: Ambulatory Visit | Attending: Radiation Oncology | Admitting: Radiation Oncology

## 2023-06-22 VITALS — BP 115/72 | HR 109 | Temp 99.0°F | Resp 20

## 2023-06-22 DIAGNOSIS — Z923 Personal history of irradiation: Secondary | ICD-10-CM | POA: Insufficient documentation

## 2023-06-22 DIAGNOSIS — C541 Malignant neoplasm of endometrium: Secondary | ICD-10-CM | POA: Insufficient documentation

## 2023-06-22 NOTE — Progress Notes (Signed)
 Radiation Oncology Follow up Note  Name: Valerie Case   Date:   06/22/2023 MRN:  [id number] DOB: 03-10-1951    This 73 y.o. 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  REFERRING PROVIDER: Barbette Reichmann, MD  HPI: Patient is a 73 year old female now out 19 months having completed pelvic radiation therapy for poorly differentiated endometrial adenocarcinoma stage III.  She is currently doing well.  Specifically denies any increased lower urinary tract symptoms diarrhea or fatigue..  She recently had a CT scan showing similar size of indeterminate left adrenal nodule unchanged back to 23 unlikely to represent metastatic disease.  She did noted to have aortic valvular calcifications is seeing cardiology for evaluation of valvular dysfunction.  She is currently on Pembro which she is tolerating well every 3 weeks.  COMPLICATIONS OF TREATMENT: none  FOLLOW UP COMPLIANCE: keeps appointments   PHYSICAL EXAM:  BP 115/72   Pulse (!) 109   Temp 99 F (37.2 C) (Tympanic)   Resp 20  Well-developed well-nourished patient in NAD. HEENT reveals PERLA, EOMI, discs not visualized.  Oral cavity is clear. No oral mucosal lesions are identified. Neck is clear without evidence of cervical or supraclavicular adenopathy. Lungs are clear to A&P. Cardiac examination is essentially unremarkable with regular rate and rhythm without murmur rub or thrill. Abdomen is benign with no organomegaly or masses noted. Motor sensory and DTR levels are equal and symmetric in the upper and lower extremities. Cranial nerves II through XII are grossly intact. Proprioception is intact. No peripheral adenopathy or edema is identified. No motor or sensory levels are noted. Crude visual fields are within normal range.  RADIOLOGY RESULTS: CT scan of chest abdomen pelvis reviewed compatible with  above-stated findings  PLAN: Present time patient is doing well appears to be stable at this time with no progression of disease currently on Pembro.  At this time of going to turn follow-up care over to medical oncology as well as GYN oncology.  She missed her appointment with [facility] secondary to weather in the winter.  We are rescheduling that for her.  Patient knows to call with any concerns.  I would like to take this opportunity to thank you for allowing me to participate in the care of your patient.Carmina Miller, MD

## 2023-07-03 ENCOUNTER — Encounter: Payer: Self-pay | Admitting: Oncology

## 2023-07-03 ENCOUNTER — Inpatient Hospital Stay: Attending: Obstetrics and Gynecology

## 2023-07-03 ENCOUNTER — Inpatient Hospital Stay

## 2023-07-03 ENCOUNTER — Inpatient Hospital Stay: Admitting: Oncology

## 2023-07-03 ENCOUNTER — Other Ambulatory Visit: Payer: Self-pay

## 2023-07-03 VITALS — BP 112/59 | HR 78

## 2023-07-03 VITALS — BP 108/53 | HR 95 | Temp 97.9°F | Resp 18 | Wt 283.9 lb

## 2023-07-03 DIAGNOSIS — Z886 Allergy status to analgesic agent status: Secondary | ICD-10-CM | POA: Insufficient documentation

## 2023-07-03 DIAGNOSIS — N183 Chronic kidney disease, stage 3 unspecified: Secondary | ICD-10-CM | POA: Insufficient documentation

## 2023-07-03 DIAGNOSIS — Z9049 Acquired absence of other specified parts of digestive tract: Secondary | ICD-10-CM | POA: Diagnosis not present

## 2023-07-03 DIAGNOSIS — K5730 Diverticulosis of large intestine without perforation or abscess without bleeding: Secondary | ICD-10-CM | POA: Insufficient documentation

## 2023-07-03 DIAGNOSIS — C541 Malignant neoplasm of endometrium: Secondary | ICD-10-CM | POA: Insufficient documentation

## 2023-07-03 DIAGNOSIS — Z5112 Encounter for antineoplastic immunotherapy: Secondary | ICD-10-CM | POA: Insufficient documentation

## 2023-07-03 DIAGNOSIS — Z86018 Personal history of other benign neoplasm: Secondary | ICD-10-CM | POA: Diagnosis not present

## 2023-07-03 DIAGNOSIS — E1122 Type 2 diabetes mellitus with diabetic chronic kidney disease: Secondary | ICD-10-CM | POA: Diagnosis not present

## 2023-07-03 DIAGNOSIS — Z801 Family history of malignant neoplasm of trachea, bronchus and lung: Secondary | ICD-10-CM | POA: Diagnosis not present

## 2023-07-03 DIAGNOSIS — I129 Hypertensive chronic kidney disease with stage 1 through stage 4 chronic kidney disease, or unspecified chronic kidney disease: Secondary | ICD-10-CM | POA: Diagnosis not present

## 2023-07-03 DIAGNOSIS — Z79620 Long term (current) use of immunosuppressive biologic: Secondary | ICD-10-CM | POA: Diagnosis not present

## 2023-07-03 DIAGNOSIS — G629 Polyneuropathy, unspecified: Secondary | ICD-10-CM

## 2023-07-03 DIAGNOSIS — I700 Atherosclerosis of aorta: Secondary | ICD-10-CM | POA: Diagnosis not present

## 2023-07-03 DIAGNOSIS — Z8719 Personal history of other diseases of the digestive system: Secondary | ICD-10-CM | POA: Insufficient documentation

## 2023-07-03 DIAGNOSIS — Z803 Family history of malignant neoplasm of breast: Secondary | ICD-10-CM | POA: Diagnosis not present

## 2023-07-03 DIAGNOSIS — J45909 Unspecified asthma, uncomplicated: Secondary | ICD-10-CM | POA: Insufficient documentation

## 2023-07-03 DIAGNOSIS — N189 Chronic kidney disease, unspecified: Secondary | ICD-10-CM

## 2023-07-03 DIAGNOSIS — I2510 Atherosclerotic heart disease of native coronary artery without angina pectoris: Secondary | ICD-10-CM | POA: Insufficient documentation

## 2023-07-03 DIAGNOSIS — Z809 Family history of malignant neoplasm, unspecified: Secondary | ICD-10-CM | POA: Insufficient documentation

## 2023-07-03 DIAGNOSIS — Z923 Personal history of irradiation: Secondary | ICD-10-CM | POA: Insufficient documentation

## 2023-07-03 DIAGNOSIS — Z881 Allergy status to other antibiotic agents status: Secondary | ICD-10-CM | POA: Insufficient documentation

## 2023-07-03 DIAGNOSIS — Z9089 Acquired absence of other organs: Secondary | ICD-10-CM | POA: Diagnosis not present

## 2023-07-03 DIAGNOSIS — F32A Depression, unspecified: Secondary | ICD-10-CM | POA: Insufficient documentation

## 2023-07-03 DIAGNOSIS — Z79899 Other long term (current) drug therapy: Secondary | ICD-10-CM | POA: Insufficient documentation

## 2023-07-03 DIAGNOSIS — N1831 Chronic kidney disease, stage 3a: Secondary | ICD-10-CM | POA: Diagnosis not present

## 2023-07-03 DIAGNOSIS — D631 Anemia in chronic kidney disease: Secondary | ICD-10-CM | POA: Insufficient documentation

## 2023-07-03 DIAGNOSIS — K760 Fatty (change of) liver, not elsewhere classified: Secondary | ICD-10-CM | POA: Insufficient documentation

## 2023-07-03 DIAGNOSIS — Z794 Long term (current) use of insulin: Secondary | ICD-10-CM | POA: Diagnosis not present

## 2023-07-03 LAB — COMPREHENSIVE METABOLIC PANEL WITH GFR
ALT: 18 U/L (ref 0–44)
AST: 22 U/L (ref 15–41)
Albumin: 3.6 g/dL (ref 3.5–5.0)
Alkaline Phosphatase: 61 U/L (ref 38–126)
Anion gap: 11 (ref 5–15)
BUN: 43 mg/dL — ABNORMAL HIGH (ref 8–23)
CO2: 19 mmol/L — ABNORMAL LOW (ref 22–32)
Calcium: 9.5 mg/dL (ref 8.9–10.3)
Chloride: 107 mmol/L (ref 98–111)
Creatinine, Ser: 1.38 mg/dL — ABNORMAL HIGH (ref 0.44–1.00)
GFR, Estimated: 41 mL/min — ABNORMAL LOW (ref >60)
Glucose, Bld: 165 mg/dL — ABNORMAL HIGH (ref 70–99)
Potassium: 4.5 mmol/L (ref 3.5–5.1)
Sodium: 137 mmol/L (ref 135–145)
Total Bilirubin: 0.6 mg/dL (ref 0.0–1.2)
Total Protein: 7.1 g/dL (ref 6.5–8.1)

## 2023-07-03 LAB — CBC WITH DIFFERENTIAL/PLATELET
Abs Immature Granulocytes: 0.04 10*3/uL (ref 0.00–0.07)
Basophils Absolute: 0 10*3/uL (ref 0.0–0.1)
Basophils Relative: 1 %
Eosinophils Absolute: 0.3 10*3/uL (ref 0.0–0.5)
Eosinophils Relative: 4 %
HCT: 35.5 % — ABNORMAL LOW (ref 36.0–46.0)
Hemoglobin: 11.4 g/dL — ABNORMAL LOW (ref 12.0–15.0)
Immature Granulocytes: 1 %
Lymphocytes Relative: 21 %
Lymphs Abs: 1.4 10*3/uL (ref 0.7–4.0)
MCH: 31.4 pg (ref 26.0–34.0)
MCHC: 32.1 g/dL (ref 30.0–36.0)
MCV: 97.8 fL (ref 80.0–100.0)
Monocytes Absolute: 0.7 10*3/uL (ref 0.1–1.0)
Monocytes Relative: 11 %
Neutro Abs: 4 10*3/uL (ref 1.7–7.7)
Neutrophils Relative %: 62 %
Platelets: 273 10*3/uL (ref 150–400)
RBC: 3.63 MIL/uL — ABNORMAL LOW (ref 3.87–5.11)
RDW: 13.3 % (ref 11.5–15.5)
WBC: 6.4 10*3/uL (ref 4.0–10.5)
nRBC: 0 % (ref 0.0–0.2)

## 2023-07-03 MED ORDER — SODIUM CHLORIDE 0.9 % IV SOLN
Freq: Once | INTRAVENOUS | Status: AC
  Filled 2023-07-03: qty 250

## 2023-07-03 MED ORDER — HEPARIN SOD (PORK) LOCK FLUSH 100 UNIT/ML IV SOLN
500.0000 [IU] | Freq: Once | INTRAVENOUS | Status: AC | PRN
  Administered 2023-07-03: 500 [IU]
  Filled 2023-07-03: qty 5

## 2023-07-03 MED ORDER — SODIUM CHLORIDE 0.9 % IV SOLN
200.0000 mg | Freq: Once | INTRAVENOUS | Status: AC
  Administered 2023-07-03: 200 mg via INTRAVENOUS
  Filled 2023-07-03: qty 8

## 2023-07-03 NOTE — Assessment & Plan Note (Signed)
 Treatment plan as listed above.

## 2023-07-03 NOTE — Progress Notes (Signed)
 Hematology/Oncology Progress note [phone] [phone]) [phone]      CHIEF COMPLAINTS/REASON FOR VISIT:  Follow up for endometrial cancer  ASSESSMENT & PLAN:   Cancer Staging  Endometrial adenocarcinoma [facility]) Staging form: Corpus Uteri - Carcinoma and Carcinosarcoma, AJCC 8th Edition - Clinical stage from 04/10/2021: FIGO Stage III, calculated as Stage Unknown (cT3, cNX, cM0) - Signed by Babara Call, MD on 08/24/2021   Endometrial adenocarcinoma (HCC) FIGO grade 3 poorly differentiated endometrial adenocarcinoma. s/p carboplatin  and Taxol  for 5 cycles, on Keytruda  maintenance.  Labs are reviewed and discussed with patient.  Proceed with Keytruda  CT showed NED/stable disease,stable adrenal nodule   Anemia in chronic kidney disease (CKD) Stable hemoglobin  Encounter for antineoplastic immunotherapy Treatment plan as listed above.   Neuropathy Continue  gabapentin  100mg  daily  CKD (chronic kidney disease) stage 3, GFR 30-59 ml/min (HCC) Worse Cr today.  Encourage oral hydration and avoid nephrotoxins.  monitor    Orders Placed This Encounter  Procedures   CBC with Differential    Standing Status:   Future    Expected Date:   08/14/2023    Expiration Date:   08/13/2024   Comprehensive metabolic panel    Standing Status:   Future    Expected Date:   08/14/2023    Expiration Date:   08/13/2024   TSH    Standing Status:   Future    Expected Date:   08/14/2023    Expiration Date:   08/13/2024    Return of visit:  3 weeks Lab MD Keytruda   Call Babara, MD, [name] [facility] Hematology Oncology 07/03/2023     HISTORY OF PRESENTING ILLNESS:   Valerie Case is a  73 y.o.  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 Oncology History  Endometrial adenocarcinoma (HCC)  12/12/2020 Imaging   12/12/2020, CT abdomen pelvis with contrast showed no radiographic evidence of urinary tract neoplasm, calculi,  hydronephrosis.  2.6 cm nonspecific left adrenal mass. 02/12/2021 CT hematuria work-up showed small uterine fibroids, no findings to explain hematuria.Hepatomegaly, diverticulosis without evidence of diverticulitis, Coronary artery disease   04/01/2021 -  Hospital Admission   04/01/2021 - 04/03/2021, patient was hospitalized due to symptomatic anemia, hemoglobin 6.9, heavy postmenopausal bleeding with passing large clots.  She also presented with increased creatinine level to 2.58 compared to her baseline level of 0.9 in November 2022.  Patient received IV iron  infusion and 2 units of PRBC during the hospital stay..  04/02/2021, iron  panel showed iron  saturation 37, ferritin 37, TIBC 333-the studies were done after patient received blood transfusion.  At discharge, hemoglobin was 7.7.   04/01/2021 Initial Diagnosis   04/01/2020, endometrial biopsy showed poorly differentiated endometrial adenocarcinoma. Omniseq NGS showed TMB 48.4 mt/mb [high], MSI High, PD-L1-TPS <1%, PIK3CA H1047Q, Negative for BRAF, HER2, NTRK1 RET.     04/10/2021 Cancer Staging   Staging form: Corpus Uteri - Carcinoma and Carcinosarcoma, AJCC 8th Edition - Clinical stage from 04/10/2021: FIGO Stage III, calculated as Stage Unknown (cT3, cNX, cM0) - Signed by Babara Call, MD on 08/24/2021 Stage prefix: Initial diagnosis   04/15/2021 Imaging   PET showed 1. Large hypermetabolic endometrial mass consistent with known endometrial cancer. Suspect direct invasion/involvement of the right adnexa. Right pelvic sidewall hypermetabolic adenopathy.2. 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 3. No findings for metastatic disease involving the chest or bonystructures.     04/24/2021 -  05/29/2021 Radiation Therapy   status post radiation to pelvis   08/05/2021 Imaging   MRI abdomen w wo contrast 1. Stable solid enhancing 2.7 cm left adrenal lesion, which  was hypermetabolic on prior PET/CTs but is unchanged in size dating back to December 12, 2020. 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 However, unfortunately metastatic disease can not be entirely excluded and remains a pertinent differential consideration. Comparison with more remote prior imaging would be the most valuable tool in the assessment of this lesion, as demonstrating long-term stability would indicate this to be a benign lesion. However, if no prior imaging can be made available, would consider follow-up adrenal protocol CT with and without intravenous contrast material in 3 months as this would allow for further assessment of stability as well as enhancement and washout characteristics of the lesion potentially allowing for better characterization versus direct tissue sampling.2. Hepatomegaly and hepatic steatosis.3. Colonic diverticulosis without findings of acute diverticulitis    08/16/2021 - 09/27/2021 Chemotherapy   UTERINE Carboplatin  AUC 5 / Paclitaxel  q21d x 3      10/03/2021 Imaging   PET 1. No residual hypermetabolism in the uterus suggesting an excellent response to treatment. No findings for metastatic disease. 2. Resolution of hypermetabolism in the left adrenal gland lesion suggesting this was metastatic disease. 3. Diffuse marrow hypermetabolism likely due to rebound from chemotherapy or marrow stimulating drugs   10/18/2021 - 11/08/2021 Chemotherapy   AUC 5 / Paclitaxel / Keytruda  Q21d  x 2 cycles   11/29/2021 -  Chemotherapy   Patient is on Treatment Plan : UTERINE Pembrolizumab  (200) q21d     04/30/2022 Imaging   CT chest abdomen pelvis w contrast  1. Similar to minimal decrease in size of a left adrenal nodule which was felt suspicious for metastatic disease on 10/03/2021 PET. 2. No evidence of new  or progressive disease. 3. Coronary artery atherosclerosis. Aortic Atherosclerosis (ICD10-I70.0). 4. Incidental findings, including: Hepatomegaly. Possible constipation.   08/28/2022 Imaging   CT chest abdomen pelvis w contrast showed 1. Similar appearance of indeterminate left adrenal nodule. 2. Otherwise, no evidence of metastatic disease. 3. Hepatomegaly 4.  Possible constipation. 5. Coronary artery atherosclerosis. Aortic Atherosclerosis (ICD10-I70.0).   12/22/2022 Imaging   CT chest abdomen pelvis w contrast showed 1. Stable indeterminate left adrenal nodule. 2. Otherwise, no noncontrast enhanced CT evidence of metastatic disease. 3. Colonic diverticulosis without findings of acute diverticulitis. 4.  Aortic Atherosclerosis (ICD10-I70.0).     04/17/2023 Imaging   CT chest abdomen pelvis wo contrast  1. Similar size of the indeterminate left adrenal nodule which is unchanged back to 06/10/2021. Felt unlikely to represent metastatic disease. Recommend attention on follow-up. 2. Otherwise, no evidence of metastatic disease. 3. Aortic valvular calcifications. Consider echocardiography to evaluate for valvular dysfunction. 4.  Aortic Atherosclerosis (ICD10-I70.0).       INTERVAL HISTORY Valerie Case is a 73 y.o. female who has above history reviewed by me today presents for follow up visit for anemia and endometrial cancer Overall she tolerates treatment Today Patient denies SOB,  nausea vomiting diarrhea.   Review of Systems  Constitutional:  Positive for fatigue. Negative for chills and fever.  HENT:   Negative for hearing loss and voice change.   Eyes:  Negative for eye problems.  Respiratory:  Negative for chest tightness and cough.   Cardiovascular:  Negative for  chest pain.  Gastrointestinal:  Negative for abdominal distention, abdominal pain and blood in stool.  Endocrine: Negative for hot flashes.  Genitourinary:  Negative for difficulty urinating, frequency  and vaginal bleeding.   Musculoskeletal:  Positive for arthralgias.  Skin:  Negative for itching and rash.  Neurological:  Negative for extremity weakness and headaches.  Hematological:  Negative for adenopathy.  Psychiatric/Behavioral:  Negative for confusion.     MEDICAL HISTORY:  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   Mixed hyperlipidemia    Peripheral polyneuropathy    Sleep apnea     SURGICAL HISTORY: Past Surgical History:  Procedure Laterality Date   APPENDECTOMY     BIOPSY  03/17/2023   Procedure: BIOPSY;  Surgeon: Onita Elspeth Sharper, DO;  Location: [facility] ENDOSCOPY;  Service: Gastroenterology;;   CESAREAN SECTION  01/23/1979   COLONOSCOPY     COLONOSCOPY N/A 03/17/2023   Procedure: COLONOSCOPY;  Surgeon: Onita Elspeth Sharper, DO;  Location: [facility] ENDOSCOPY;  Service: Gastroenterology;  Laterality: N/A;   COLONOSCOPY WITH PROPOFOL  N/A 03/13/2023   Procedure: COLONOSCOPY WITH PROPOFOL ;  Surgeon: Onita Elspeth Sharper, DO;  Location: [facility] ENDOSCOPY;  Service: Gastroenterology;  Laterality: N/A;   ESOPHAGOGASTRODUODENOSCOPY     ESOPHAGOGASTRODUODENOSCOPY N/A 03/17/2023   Procedure: ESOPHAGOGASTRODUODENOSCOPY (EGD);  Surgeon: Onita Elspeth Sharper, DO;  Location: [facility] ENDOSCOPY;  Service: Gastroenterology;  Laterality: N/A;   ESOPHAGOGASTRODUODENOSCOPY (EGD) WITH PROPOFOL  N/A 03/13/2023   Procedure: ESOPHAGOGASTRODUODENOSCOPY (EGD) WITH PROPOFOL ;  Surgeon: Onita Elspeth Sharper, DO;  Location: [facility] ENDOSCOPY;  Service:  Gastroenterology;  Laterality: N/A;   IR IMAGING GUIDED PORT INSERTION  08/09/2021   TONSILLECTOMY  03/17/1956    SOCIAL HISTORY: Social History   Socioeconomic History   Marital status: Divorced    Spouse name: Not on file   Number of children: Not on file   Years of education: Not on file   Highest education level: Not on file  Occupational History   Not on file  Tobacco Use   Smoking status: Never   Smokeless tobacco: Never  Vaping Use   Vaping status: Never Used  Substance and Sexual Activity   Alcohol use: Not Currently   Drug use: Never   Sexual activity: Not Currently  Other Topics Concern   Not on file  Social History Narrative      Social Drivers of Health   Financial Resource Strain: Low Risk  (05/19/2023)   Received from [facility]   Overall Financial Resource Strain (CARDIA)    Difficulty of Paying Living Expenses: Not hard at all  Food Insecurity: No Food Insecurity (05/19/2023)   Received from [facility]   Hunger Vital Sign    Worried About Running Out of Food in the Last Year: Never true    Ran Out of Food in the Last Year: Never true  Transportation Needs: No Transportation Needs (05/19/2023)   Received from [facility]   PRAPARE -  Transportation    In the past 12 months, has lack of transportation kept you from medical appointments or from getting medications?: No    Lack of Transportation (Non-Medical): No  Physical Activity: Inactive (09/27/2021)   Exercise Vital Sign    Days of Exercise per Week: 0 days    Minutes of Exercise per Session: 0 min  Stress: Stress Concern Present (09/27/2021)   [employer] of Occupational Health - Occupational Stress Questionnaire    Feeling of Stress : Rather much  Social Connections: Moderately Integrated (09/27/2021)   Social Connection and Isolation Panel [NHANES]    Frequency of Communication with Friends and Family: Three times a week    Frequency of  Social Gatherings with Friends and Family: Three times a week    Attends Religious Services: 1 to 4 times per year    Active Member of Clubs or Organizations: No    Attends Banker Meetings: 1 to 4 times per year    Marital Status: Divorced  Catering Manager Violence: Not At Risk (09/27/2021)   Humiliation, Afraid, Rape, and Kick questionnaire    Fear of Current or Ex-Partner: No    Emotionally Abused: No    Physically Abused: No    Sexually Abused: No    FAMILY HISTORY: Family History  Problem Relation Age of Onset   Breast cancer Mother 22   Cancer Mother    Cancer Father    Lung cancer Father    Breast cancer Cousin        dx 27s maternal    ALLERGIES:  is allergic to doxycycline , omeprazole, and aspirin.  MEDICATIONS:  Current Outpatient Medications  Medication Sig Dispense Refill   acetaminophen  (TYLENOL ) 650 MG CR tablet Take 1,300 mg by mouth every 8 (eight) hours as needed for pain.     albuterol  (VENTOLIN  HFA) 108 (90 Base) MCG/ACT inhaler Inhale 2 puffs into the lungs every 6 (six) hours as needed for wheezing or shortness of breath.     azelastine  (ASTELIN ) 0.1 % nasal spray Place 2 sprays into both nostrils daily. Use in each nostril as directed 30 mL 0   Cranberry-Vitamin C-Probiotic (AZO CRANBERRY PO) Take by mouth.     docusate sodium  (COLACE) 100 MG capsule Take 1 capsule (100 mg total) by mouth 2 (two) times daily. 60 capsule 1   empagliflozin (JARDIANCE) 25 MG TABS tablet Take 25 mg by mouth daily.     ferrous sulfate 325 (65 FE) MG EC tablet Take 325 mg by mouth daily with breakfast.     FLUoxetine  (PROZAC ) 10 MG capsule Take 10 mg by mouth daily.     fluticasone  (FLONASE ) 50 MCG/ACT nasal spray Place 1 spray into both nostrils daily as needed for allergies or rhinitis.     furosemide (LASIX) 20 MG tablet Take 20 mg by mouth daily.     gabapentin  (NEURONTIN ) 100 MG capsule Take 1 capsule (100 mg total) by mouth daily. 90 capsule 1    hydrochlorothiazide  (HYDRODIURIL ) 12.5 MG tablet Take 12.5 mg by mouth daily.     hydrocortisone  2.5 % cream Apply topically 3 (three) times daily. 30 g 2   Insulin  Asp Prot & Asp FlexPen (NOVOLOG  70/30 MIX) (70-30) 100 UNIT/ML FlexPen Inject into the skin.     lidocaine -prilocaine  (EMLA ) cream Apply small amount to port and cover with saran wrap 1-2 hours prior to port access -daily PRN 30 g 3   lisinopril  (ZESTRIL ) 40 MG tablet Take 40 mg by mouth at  bedtime.     loratadine (CLARITIN) 10 MG tablet Take by mouth.     LORazepam  (ATIVAN ) 0.5 MG tablet Take 1 tablet (0.5 mg total) by mouth every 8 (eight) hours as needed for anxiety (nausea vomiting). 30 tablet 0   metFORMIN  (GLUCOPHAGE -XR) 500 MG 24 hr tablet Take 500 mg by mouth at bedtime.     metoprolol  succinate (TOPROL -XL) 25 MG 24 hr tablet Take 25 mg by mouth daily.     mupirocin  ointment (BACTROBAN ) 2 % Apply 1 Application topically 3 (three) times daily. 22 g 0   NOVOLIN 70/30 FLEXPEN (70-30) 100 UNIT/ML KwikPen 50-110 Units See admin instructions. 50 units in the morning, 110 units at bedtime     OZEMPIC , 1 MG/DOSE, 4 MG/3ML SOPN Inject 1 mg into the skin every Tuesday.     rosuvastatin  (CRESTOR ) 10 MG tablet Take 10 mg by mouth daily.     traMADol  (ULTRAM ) 50 MG tablet Take 2 tablets (100 mg total) by mouth every 6 (six) hours as needed. 90 tablet 0   fluconazole  (DIFLUCAN ) 150 MG tablet Take 1 tablet (150 mg total) by mouth daily. Take 1 tablet day one and repeat 2 days later. (Patient not taking: Reported on 07/03/2023) 2 tablet 0   omeprazole (PRILOSEC) 20 MG capsule Take 20 mg by mouth daily. (Patient not taking: Reported on 06/12/2023)     No current facility-administered medications for this visit.   Facility-Administered Medications Ordered in Other Visits  Medication Dose Route Frequency Provider Last Rate Last Admin   heparin  lock flush 100 UNIT/ML injection            heparin  lock flush 100 unit/mL  500 Units Intracatheter  Once PRN Babara Call, MD       pembrolizumab  (KEYTRUDA ) 200 mg in sodium chloride  0.9 % 50 mL chemo infusion  200 mg Intravenous Once Babara Call, MD         PHYSICAL EXAMINATION: ECOG PERFORMANCE STATUS: 1 - Symptomatic but completely ambulatory  Physical Exam Constitutional:      General: She is not in acute distress.    Appearance: She is obese.  HENT:     Head: Normocephalic and atraumatic.  Eyes:     General: No scleral icterus. Cardiovascular:     Rate and Rhythm: Normal rate and regular rhythm.     Heart sounds: Murmur heard.  Pulmonary:     Effort: Pulmonary effort is normal. No respiratory distress.  Abdominal:     General: Bowel sounds are normal. There is no distension.     Palpations: Abdomen is soft.  Musculoskeletal:        General: No deformity. Normal range of motion.     Cervical back: Normal range of motion.     Right lower leg: Edema present.     Left lower leg: Edema present.     Comments:    Skin:    General: Skin is warm and dry.     Findings: No rash.     Comments: Chronic venous sufficiency skin changes  Neurological:     Mental Status: She is alert and oriented to person, place, and time. Mental status is at baseline.     Cranial Nerves: No cranial nerve deficit.     Coordination: Coordination normal.  Psychiatric:        Mood and Affect: Mood normal.    LABORATORY DATA:  I have reviewed the data as listed    Latest Ref Rng & Units 07/03/2023  8:06 AM 06/12/2023    8:05 AM 05/22/2023    8:25 AM  CBC  WBC 4.0 - 10.5 K/uL 6.4  6.6  6.8   Hemoglobin 12.0 - 15.0 g/dL 88.5  88.1  88.2   Hematocrit 36.0 - 46.0 % 35.5  36.4  36.0   Platelets 150 - 400 K/uL 273  303  277       Latest Ref Rng & Units 07/03/2023    8:06 AM 06/12/2023    8:05 AM 05/22/2023    8:25 AM  CMP  Glucose 70 - 99 mg/dL 834  825  836   BUN 8 - 23 mg/dL 43  33  32   Creatinine 0.44 - 1.00 mg/dL 8.61  8.96  8.94   Sodium 135 - 145 mmol/L 137  140  135   Potassium 3.5 - 5.1  mmol/L 4.5  4.1  4.9   Chloride 98 - 111 mmol/L 107  107  103   CO2 22 - 32 mmol/L 19  23  21    Calcium  8.9 - 10.3 mg/dL 9.5  89.8  9.6   Total Protein 6.5 - 8.1 g/dL 7.1  7.3  6.9   Total Bilirubin 0.0 - 1.2 mg/dL 0.6  0.5  0.4   Alkaline Phos 38 - 126 U/L 61  61  57   AST 15 - 41 U/L 22  23  20    ALT 0 - 44 U/L 18  20  17        RADIOGRAPHIC STUDIES: I have personally reviewed the radiological images as listed and agreed with the findings in the report. CT CHEST ABDOMEN PELVIS WO CONTRAST Result Date: 04/27/2023 CLINICAL DATA:  History of endometrial adenocarcinoma. Diagnosed in 2023. Chemotherapy and radiation therapy. Asymptomatic. * Tracking Code: BO * EXAM: CT CHEST, ABDOMEN AND PELVIS WITHOUT CONTRAST TECHNIQUE: Multidetector CT imaging of the chest, abdomen and pelvis was performed following the standard protocol without IV contrast. RADIATION DOSE REDUCTION: This exam was performed according to the departmental dose-optimization program which includes automated exposure control, adjustment of the mA and/or kV according to patient size and/or use of iterative reconstruction technique. COMPARISON:  12/22/2022 FINDINGS: CT CHEST FINDINGS Cardiovascular: Right Port-A-Cath tip high right atrium. Aortic atherosclerosis. Tortuous thoracic aorta. Mild cardiomegaly, without pericardial effusion. Three vessel coronary artery calcification. Aortic valve calcification. Mediastinum/Nodes: No supraclavicular adenopathy. No mediastinal or hilar adenopathy, given limitations of unenhanced CT. Left posterior thyroid  prominence including on 9/2 is unchanged. Lungs/Pleura: No pleural fluid. Mild biapical pleuroparenchymal scarring. Minimal subpleural left lower lobe nodularity, favoring scarring on 97/3. A 2-3 mm left lower lobe nodule on 93/3 is unchanged. This has been present over multiple prior exams and is considered benign. Musculoskeletal: Included within the abdomen pelvic section. CT ABDOMEN PELVIS  FINDINGS Hepatobiliary: Normal noncontrast appearance of the liver. Normal gallbladder, without biliary ductal dilatation. Pancreas: Normal, without mass or ductal dilatation. Spleen: Nonspecific hypoattenuating anterior splenic lesion of 1.0 cm is present on the prior and of doubtful clinical significance. Adrenals/Urinary Tract: Normal right adrenal gland. A left adrenal nodule measures 2.7 x 2.4 cm and 45 HU, similar. No renal calculi or hydronephrosis.    No bladder calculi. Stomach/Bowel: Normal stomach, without wall thickening. Extensive colonic diverticulosis. Normal terminal ileum. Normal small bowel. Vascular/Lymphatic: Aortic atherosclerosis. No abdominopelvic adenopathy. Reproductive: Stable appearance of the uterus, without dominant endometrial mass. No adnexal mass. Other: No significant free fluid. No free intraperitoneal air. No evidence of omental or peritoneal disease. Musculoskeletal: Presumed sebaceous cyst about  the upper posterior chest wall, similar. IMPRESSION: 1. Similar size of the indeterminate left adrenal nodule which is unchanged back to 06/10/2021. Felt unlikely to represent metastatic disease. Recommend attention on follow-up. 2. Otherwise, no evidence of metastatic disease. 3. Aortic valvular calcifications. Consider echocardiography to evaluate for valvular dysfunction. 4.  Aortic Atherosclerosis (ICD10-I70.0). Electronically Signed   By: Rockey Kilts M.D.   On: 04/27/2023 14:47

## 2023-07-03 NOTE — Assessment & Plan Note (Signed)
 Stable hemoglobin

## 2023-07-03 NOTE — Assessment & Plan Note (Signed)
 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 s/p carboplatin and Taxol for 5 cycles, on Keytruda maintenance.  Labs are reviewed and discussed with patient.  Proceed with Keytruda CT showed NED/stable disease,stable adrenal nodule

## 2023-07-03 NOTE — Assessment & Plan Note (Signed)
Continue gabapentin 100mg  daily.

## 2023-07-03 NOTE — Assessment & Plan Note (Signed)
 Worse Cr today.  Encourage oral hydration and avoid nephrotoxins.  monitor

## 2023-07-04 ENCOUNTER — Other Ambulatory Visit: Payer: Self-pay

## 2023-07-08 ENCOUNTER — Other Ambulatory Visit: Payer: Self-pay

## 2023-07-09 ENCOUNTER — Other Ambulatory Visit: Payer: Self-pay

## 2023-07-24 ENCOUNTER — Inpatient Hospital Stay: Attending: Obstetrics and Gynecology

## 2023-07-24 ENCOUNTER — Encounter: Payer: Self-pay | Admitting: Oncology

## 2023-07-24 ENCOUNTER — Inpatient Hospital Stay

## 2023-07-24 ENCOUNTER — Inpatient Hospital Stay: Admitting: Oncology

## 2023-07-24 VITALS — BP 118/59 | HR 97 | Temp 98.2°F | Resp 18 | Wt 281.4 lb

## 2023-07-24 DIAGNOSIS — G629 Polyneuropathy, unspecified: Secondary | ICD-10-CM | POA: Diagnosis not present

## 2023-07-24 DIAGNOSIS — N1831 Chronic kidney disease, stage 3a: Secondary | ICD-10-CM | POA: Diagnosis not present

## 2023-07-24 DIAGNOSIS — N189 Chronic kidney disease, unspecified: Secondary | ICD-10-CM

## 2023-07-24 DIAGNOSIS — R160 Hepatomegaly, not elsewhere classified: Secondary | ICD-10-CM | POA: Insufficient documentation

## 2023-07-24 DIAGNOSIS — Z5112 Encounter for antineoplastic immunotherapy: Secondary | ICD-10-CM

## 2023-07-24 DIAGNOSIS — K5730 Diverticulosis of large intestine without perforation or abscess without bleeding: Secondary | ICD-10-CM | POA: Insufficient documentation

## 2023-07-24 DIAGNOSIS — R609 Edema, unspecified: Secondary | ICD-10-CM | POA: Insufficient documentation

## 2023-07-24 DIAGNOSIS — E1122 Type 2 diabetes mellitus with diabetic chronic kidney disease: Secondary | ICD-10-CM | POA: Diagnosis not present

## 2023-07-24 DIAGNOSIS — Z9089 Acquired absence of other organs: Secondary | ICD-10-CM | POA: Insufficient documentation

## 2023-07-24 DIAGNOSIS — M2550 Pain in unspecified joint: Secondary | ICD-10-CM | POA: Diagnosis not present

## 2023-07-24 DIAGNOSIS — Z886 Allergy status to analgesic agent status: Secondary | ICD-10-CM | POA: Insufficient documentation

## 2023-07-24 DIAGNOSIS — E278 Other specified disorders of adrenal gland: Secondary | ICD-10-CM | POA: Diagnosis not present

## 2023-07-24 DIAGNOSIS — Z8719 Personal history of other diseases of the digestive system: Secondary | ICD-10-CM | POA: Insufficient documentation

## 2023-07-24 DIAGNOSIS — I700 Atherosclerosis of aorta: Secondary | ICD-10-CM | POA: Insufficient documentation

## 2023-07-24 DIAGNOSIS — Z79899 Other long term (current) drug therapy: Secondary | ICD-10-CM | POA: Diagnosis not present

## 2023-07-24 DIAGNOSIS — Z809 Family history of malignant neoplasm, unspecified: Secondary | ICD-10-CM | POA: Insufficient documentation

## 2023-07-24 DIAGNOSIS — I2510 Atherosclerotic heart disease of native coronary artery without angina pectoris: Secondary | ICD-10-CM | POA: Insufficient documentation

## 2023-07-24 DIAGNOSIS — C541 Malignant neoplasm of endometrium: Secondary | ICD-10-CM

## 2023-07-24 DIAGNOSIS — Z923 Personal history of irradiation: Secondary | ICD-10-CM | POA: Insufficient documentation

## 2023-07-24 DIAGNOSIS — Z803 Family history of malignant neoplasm of breast: Secondary | ICD-10-CM | POA: Insufficient documentation

## 2023-07-24 DIAGNOSIS — I129 Hypertensive chronic kidney disease with stage 1 through stage 4 chronic kidney disease, or unspecified chronic kidney disease: Secondary | ICD-10-CM | POA: Diagnosis not present

## 2023-07-24 DIAGNOSIS — Z9049 Acquired absence of other specified parts of digestive tract: Secondary | ICD-10-CM | POA: Diagnosis not present

## 2023-07-24 DIAGNOSIS — N183 Chronic kidney disease, stage 3 unspecified: Secondary | ICD-10-CM | POA: Insufficient documentation

## 2023-07-24 DIAGNOSIS — D631 Anemia in chronic kidney disease: Secondary | ICD-10-CM

## 2023-07-24 DIAGNOSIS — Z86018 Personal history of other benign neoplasm: Secondary | ICD-10-CM | POA: Insufficient documentation

## 2023-07-24 DIAGNOSIS — K760 Fatty (change of) liver, not elsewhere classified: Secondary | ICD-10-CM | POA: Diagnosis not present

## 2023-07-24 DIAGNOSIS — R5383 Other fatigue: Secondary | ICD-10-CM | POA: Insufficient documentation

## 2023-07-24 DIAGNOSIS — Z801 Family history of malignant neoplasm of trachea, bronchus and lung: Secondary | ICD-10-CM | POA: Insufficient documentation

## 2023-07-24 DIAGNOSIS — Z881 Allergy status to other antibiotic agents status: Secondary | ICD-10-CM | POA: Insufficient documentation

## 2023-07-24 LAB — COMPREHENSIVE METABOLIC PANEL WITH GFR
ALT: 18 U/L (ref 0–44)
AST: 19 U/L (ref 15–41)
Albumin: 3.9 g/dL (ref 3.5–5.0)
Alkaline Phosphatase: 57 U/L (ref 38–126)
Anion gap: 11 (ref 5–15)
BUN: 31 mg/dL — ABNORMAL HIGH (ref 8–23)
CO2: 22 mmol/L (ref 22–32)
Calcium: 9.9 mg/dL (ref 8.9–10.3)
Chloride: 107 mmol/L (ref 98–111)
Creatinine, Ser: 1.08 mg/dL — ABNORMAL HIGH (ref 0.44–1.00)
GFR, Estimated: 55 mL/min — ABNORMAL LOW (ref >60)
Glucose, Bld: 104 mg/dL — ABNORMAL HIGH (ref 70–99)
Potassium: 4.4 mmol/L (ref 3.5–5.1)
Sodium: 140 mmol/L (ref 135–145)
Total Bilirubin: 0.5 mg/dL (ref 0.0–1.2)
Total Protein: 7.1 g/dL (ref 6.5–8.1)

## 2023-07-24 LAB — TSH: TSH: 1.649 u[IU]/mL (ref 0.350–4.500)

## 2023-07-24 LAB — CBC WITH DIFFERENTIAL/PLATELET
Abs Immature Granulocytes: 0.05 10*3/uL (ref 0.00–0.07)
Basophils Absolute: 0 10*3/uL (ref 0.0–0.1)
Basophils Relative: 1 %
Eosinophils Absolute: 0.3 10*3/uL (ref 0.0–0.5)
Eosinophils Relative: 4 %
HCT: 35.5 % — ABNORMAL LOW (ref 36.0–46.0)
Hemoglobin: 11.6 g/dL — ABNORMAL LOW (ref 12.0–15.0)
Immature Granulocytes: 1 %
Lymphocytes Relative: 18 %
Lymphs Abs: 1.1 10*3/uL (ref 0.7–4.0)
MCH: 30.9 pg (ref 26.0–34.0)
MCHC: 32.7 g/dL (ref 30.0–36.0)
MCV: 94.7 fL (ref 80.0–100.0)
Monocytes Absolute: 0.6 10*3/uL (ref 0.1–1.0)
Monocytes Relative: 10 %
Neutro Abs: 4.1 10*3/uL (ref 1.7–7.7)
Neutrophils Relative %: 66 %
Platelets: 275 10*3/uL (ref 150–400)
RBC: 3.75 MIL/uL — ABNORMAL LOW (ref 3.87–5.11)
RDW: 13.3 % (ref 11.5–15.5)
WBC: 6.1 10*3/uL (ref 4.0–10.5)
nRBC: 0 % (ref 0.0–0.2)

## 2023-07-24 MED ORDER — PEMBROLIZUMAB CHEMO INJECTION 100 MG/4ML
200.0000 mg | Freq: Once | INTRAVENOUS | Status: AC
  Administered 2023-07-24: 200 mg via INTRAVENOUS
  Filled 2023-07-24: qty 8

## 2023-07-24 MED ORDER — SODIUM CHLORIDE 0.9 % IV SOLN
Freq: Once | INTRAVENOUS | Status: AC
  Filled 2023-07-24: qty 250

## 2023-07-24 MED ORDER — HEPARIN SOD (PORK) LOCK FLUSH 100 UNIT/ML IV SOLN
500.0000 [IU] | Freq: Once | INTRAVENOUS | Status: AC | PRN
  Administered 2023-07-24: 500 [IU]
  Filled 2023-07-24: qty 5

## 2023-07-24 NOTE — Assessment & Plan Note (Signed)
 Stable hemoglobin

## 2023-07-24 NOTE — Assessment & Plan Note (Signed)
lipid poor hyperfunctioning adenoma vs mets.  FDG avid on PET scan.  Difficult biopsy due to patient's body habitus and deep position of the mass. Resolved on PET scan, suggesting that this lesion might be a metastatic lesion. CT showed stable lesion  Continue monitor

## 2023-07-24 NOTE — Assessment & Plan Note (Signed)
 Worse Cr today.  Encourage oral hydration and avoid nephrotoxins.  monitor

## 2023-07-24 NOTE — Progress Notes (Signed)
 Hematology/Oncology Progress note [phone] [phone]      CHIEF COMPLAINTS/REASON FOR VISIT:  Follow up for endometrial cancer  ASSESSMENT & PLAN:   Cancer Staging  Endometrial adenocarcinoma [facility]) Staging form: Corpus Uteri - Carcinoma and Carcinosarcoma, AJCC 8th Edition - Clinical stage from 04/10/2021: FIGO Stage III, calculated as Stage Unknown (cT3, cNX, cM0) - Signed by Timmy Forbes, MD on 08/24/2021   Endometrial adenocarcinoma (HCC) FIGO grade 3 poorly differentiated endometrial adenocarcinoma. s/p carboplatin  and Taxol  for 5 cycles, on Keytruda  maintenance.  Labs are reviewed and discussed with patient.  Proceed with Keytruda  CT showed NED/stable disease,stable adrenal nodule  Adrenal mass (HCC) lipid poor hyperfunctioning adenoma vs mets.  FDG avid on PET scan.  Difficult biopsy due to patient's body habitus and deep position of the mass. Resolved on PET scan, suggesting that this lesion might be a metastatic lesion. CT showed stable lesion  Continue monitor   Anemia in chronic kidney disease (CKD) Stable hemoglobin  CKD (chronic kidney disease) stage 3, GFR 30-59 ml/min (HCC) Worse Cr today.  Encourage oral hydration and avoid nephrotoxins.  monitor  Encounter for antineoplastic immunotherapy Treatment plan as listed above.   Neuropathy Continue  gabapentin  100mg  daily    Orders Placed This Encounter  Procedures   CBC with Differential    Standing Status:   Future    Expected Date:   09/04/2023    Expiration Date:   09/03/2024   Comprehensive metabolic panel    Standing Status:   Future    Expected Date:   09/04/2023    Expiration Date:   09/03/2024   TSH    Standing Status:   Future    Expected Date:   09/04/2023    Expiration Date:   09/03/2024   CBC with Differential    Standing Status:   Future    Expected Date:   09/25/2023    Expiration Date:   09/24/2024   Comprehensive metabolic panel    Standing Status:   Future     Expected Date:   09/25/2023    Expiration Date:   09/24/2024   TSH    Standing Status:   Future    Expected Date:   09/25/2023    Expiration Date:   09/24/2024    Return of visit:  3 weeks Lab MD Keytruda   Timmy Forbes, MD, [name] [facility] Hematology Oncology 07/24/2023     HISTORY OF PRESENTING ILLNESS:   Valerie Case is a  73 y.o.  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 Oncology History  Endometrial adenocarcinoma (HCC)  12/12/2020 Imaging   12/12/2020, CT abdomen pelvis with contrast showed no radiographic evidence of urinary tract neoplasm, calculi, hydronephrosis.  2.6 cm nonspecific left adrenal mass. 02/12/2021 CT hematuria work-up showed small uterine fibroids, no findings to explain hematuria.Hepatomegaly, diverticulosis without evidence of diverticulitis, Coronary artery disease   04/01/2021 -  Hospital Admission   04/01/2021 - 04/03/2021, patient was hospitalized due to symptomatic anemia, hemoglobin 6.9, heavy postmenopausal bleeding with passing large clots.  She also presented with increased creatinine level to 2.58 compared to her baseline level of 0.9 in November 2022.  Patient received IV iron  infusion and 2 units of PRBC during the hospital stay..  04/02/2021, iron  panel showed iron  saturation 37, ferritin 37, TIBC 333-the studies were done after patient received blood transfusion.  At discharge, hemoglobin was 7.7.   04/01/2021 Initial Diagnosis   04/01/2020, endometrial biopsy showed poorly  differentiated endometrial adenocarcinoma. Omniseq NGS showed TMB 48.4 mt/mb [high], MSI High, PD-L1-TPS <1%, PIK3CA H1047Q, Negative for BRAF, HER2, NTRK1 RET.     04/10/2021 Cancer Staging   Staging form: Corpus Uteri - Carcinoma and Carcinosarcoma, AJCC 8th Edition - Clinical stage from 04/10/2021: FIGO Stage III, calculated as Stage Unknown (cT3, cNX, cM0) - Signed by Timmy Forbes, MD on 08/24/2021 Stage prefix: Initial  diagnosis   04/15/2021 Imaging   PET showed 1. Large hypermetabolic endometrial mass consistent with known endometrial cancer. Suspect direct invasion/involvement of the right adnexa. Right pelvic sidewall hypermetabolic adenopathy.2. 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 3. No findings for metastatic disease involving the chest or bonystructures.     04/24/2021 - 05/29/2021 Radiation Therapy   status post radiation to pelvis   08/05/2021 Imaging   MRI abdomen w wo contrast 1. Stable solid enhancing 2.7 cm left adrenal lesion, which was hypermetabolic on prior PET/CTs but is unchanged in size dating back to December 12, 2020. 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 However, unfortunately metastatic disease can not be entirely excluded and remains a pertinent differential consideration. Comparison with more remote prior imaging would be the most valuable tool in the assessment of this lesion, as demonstrating long-term stability would indicate this to be a benign lesion. However, if no prior imaging can be made available, would consider follow-up adrenal protocol CT with and without intravenous contrast material in 3 months as this would allow for further assessment of stability as well as enhancement and washout characteristics of the lesion potentially allowing for better characterization versus direct tissue sampling.2. Hepatomegaly and hepatic steatosis.3. Colonic diverticulosis without findings of acute diverticulitis    08/16/2021 - 09/27/2021 Chemotherapy   UTERINE Carboplatin  AUC 5 / Paclitaxel  q21d x 3      10/03/2021 Imaging   PET 1. No residual hypermetabolism in the uterus suggesting an excellent response to treatment. No  findings for metastatic disease. 2. Resolution of hypermetabolism in the left adrenal gland lesion suggesting this was metastatic disease. 3. Diffuse marrow hypermetabolism likely due to rebound from chemotherapy or marrow stimulating drugs   10/18/2021 - 11/08/2021 Chemotherapy   AUC 5 / Paclitaxel / Keytruda  Q21d  x 2 cycles   11/29/2021 -  Chemotherapy   Patient is on Treatment Plan : UTERINE Pembrolizumab  (200) q21d     04/30/2022 Imaging   CT chest abdomen pelvis w contrast  1. Similar to minimal decrease in size of a left adrenal nodule which was felt suspicious for metastatic disease on 10/03/2021 PET. 2. No evidence of new or progressive disease. 3. Coronary artery atherosclerosis. Aortic Atherosclerosis (ICD10-I70.0). 4. Incidental findings, including: Hepatomegaly. Possible constipation.   08/28/2022 Imaging   CT chest abdomen pelvis w contrast showed 1. Similar appearance of indeterminate left adrenal nodule. 2. Otherwise, no evidence of metastatic disease. 3. Hepatomegaly 4.  Possible constipation. 5. Coronary artery atherosclerosis. Aortic Atherosclerosis (ICD10-I70.0).   12/22/2022 Imaging   CT chest abdomen pelvis w contrast showed 1. Stable indeterminate left adrenal nodule. 2. Otherwise, no noncontrast enhanced CT evidence of metastatic disease. 3. Colonic diverticulosis without findings of acute diverticulitis. 4.  Aortic Atherosclerosis (ICD10-I70.0).     04/17/2023 Imaging   CT chest abdomen pelvis wo contrast  1. Similar size of the indeterminate left adrenal nodule  which is unchanged back to 06/10/2021. Felt unlikely to represent metastatic disease. Recommend attention on follow-up. 2. Otherwise, no evidence of metastatic disease. 3. Aortic valvular calcifications. Consider echocardiography to evaluate for valvular dysfunction. 4.  Aortic Atherosclerosis (ICD10-I70.0).       INTERVAL HISTORY Valerie Case is a 73 y.o. female who has above history  reviewed by me today presents for follow up visit for anemia and endometrial cancer Overall she tolerates treatment Today Patient denies SOB,  nausea vomiting diarrhea.   Review of Systems  Constitutional:  Positive for fatigue. Negative for chills and fever.  HENT:   Negative for hearing loss and voice change.   Eyes:  Negative for eye problems.  Respiratory:  Negative for chest tightness and cough.   Cardiovascular:  Negative for chest pain.  Gastrointestinal:  Negative for abdominal distention, abdominal pain and blood in stool.  Endocrine: Negative for hot flashes.  Genitourinary:  Negative for difficulty urinating, frequency and vaginal bleeding.   Musculoskeletal:  Positive for arthralgias.  Skin:  Negative for itching and rash.  Neurological:  Negative for extremity weakness and headaches.  Hematological:  Negative for adenopathy.  Psychiatric/Behavioral:  Negative for confusion.     MEDICAL HISTORY:  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   Mixed hyperlipidemia    Peripheral polyneuropathy    Sleep apnea     SURGICAL HISTORY: Past Surgical History:  Procedure Laterality Date   APPENDECTOMY     BIOPSY  03/17/2023   Procedure: BIOPSY;  Surgeon: Quintin Buckle, DO;  Location: [facility] ENDOSCOPY;  Service: Gastroenterology;;   CESAREAN SECTION  01/23/1979   COLONOSCOPY     COLONOSCOPY N/A 03/17/2023   Procedure: COLONOSCOPY;  Surgeon: Quintin Buckle, DO;  Location: [facility] ENDOSCOPY;  Service: Gastroenterology;   Laterality: N/A;   COLONOSCOPY WITH PROPOFOL  N/A 03/13/2023   Procedure: COLONOSCOPY WITH PROPOFOL ;  Surgeon: Quintin Buckle, DO;  Location: [facility] ENDOSCOPY;  Service: Gastroenterology;  Laterality: N/A;   ESOPHAGOGASTRODUODENOSCOPY     ESOPHAGOGASTRODUODENOSCOPY N/A 03/17/2023   Procedure: ESOPHAGOGASTRODUODENOSCOPY (EGD);  Surgeon: Quintin Buckle, DO;  Location: [facility] ENDOSCOPY;  Service: Gastroenterology;  Laterality: N/A;   ESOPHAGOGASTRODUODENOSCOPY (EGD) WITH PROPOFOL  N/A 03/13/2023   Procedure: ESOPHAGOGASTRODUODENOSCOPY (EGD) WITH PROPOFOL ;  Surgeon: Quintin Buckle, DO;  Location: [facility] ENDOSCOPY;  Service: Gastroenterology;  Laterality: N/A;   IR IMAGING GUIDED PORT INSERTION  08/09/2021   TONSILLECTOMY  03/17/1955    SOCIAL HISTORY: Social History   Socioeconomic History   Marital status: Divorced    Spouse name: Not on file   Number of children: Not on file   Years of education: Not on file   Highest education level: Not on file  Occupational History   Not on file  Tobacco Use   Smoking status: Never   Smokeless tobacco: Never  Vaping Use   Vaping status: Never Used  Substance and Sexual Activity   Alcohol use: Not Currently  Drug use: Never   Sexual activity: Not Currently  Other Topics Concern   Not on file  Social History Narrative      Social Drivers of Health   Financial Resource Strain: Low Risk  (05/19/2023)   Received from [facility]   Overall Financial Resource Strain (CARDIA)    Difficulty of Paying Living Expenses: Not hard at all  Food Insecurity: No Food Insecurity (05/19/2023)   Received from [facility]   Hunger Vital Sign    Worried About Running Out of Food in the Last Year: Never true    Ran Out of Food in the Last Year: Never true  Transportation Needs: No Transportation Needs (05/19/2023)   Received from [facility] - Transportation    In the past 12 months,  has lack of transportation kept you from medical appointments or from getting medications?: No    Lack of Transportation (Non-Medical): No  Physical Activity: Inactive (09/27/2021)   Exercise Vital Sign    Days of Exercise per Week: 0 days    Minutes of Exercise per Session: 0 min  Stress: Stress Concern Present (09/27/2021)   [employer] of Occupational Health - Occupational Stress Questionnaire    Feeling of Stress : Rather much  Social Connections: Moderately Integrated (09/27/2021)   Social Connection and Isolation Panel [NHANES]    Frequency of Communication with Friends and Family: Three times a week    Frequency of Social Gatherings with Friends and Family: Three times a week    Attends Religious Services: 1 to 4 times per year    Active Member of Clubs or Organizations: No    Attends Banker Meetings: 1 to 4 times per year    Marital Status: Divorced  Catering manager Violence: Not At Risk (09/27/2021)   Humiliation, Afraid, Rape, and Kick questionnaire    Fear of Current or Ex-Partner: No    Emotionally Abused: No    Physically Abused: No    Sexually Abused: No    FAMILY HISTORY: Family History  Problem Relation Age of Onset   Breast cancer Mother 75   Cancer Mother    Cancer Father    Lung cancer Father    Breast cancer Cousin        dx 85s maternal    ALLERGIES:  is allergic to doxycycline , omeprazole, and aspirin.  MEDICATIONS:  Current Outpatient Medications  Medication Sig Dispense Refill   acetaminophen  (TYLENOL ) 650 MG CR tablet Take 1,300 mg by mouth every 8 (eight) hours as needed for pain.     albuterol  (VENTOLIN  HFA) 108 (90 Base) MCG/ACT inhaler Inhale 2 puffs into the lungs every 6 (six) hours as needed for wheezing or shortness of breath.     azelastine  (ASTELIN ) 0.1 % nasal spray Place 2 sprays into both nostrils daily. Use in each nostril as directed 30 mL 0   Cranberry-Vitamin C-Probiotic (AZO CRANBERRY PO) Take by mouth.      docusate sodium  (COLACE) 100 MG capsule Take 1 capsule (100 mg total) by mouth 2 (two) times daily. 60 capsule 1   empagliflozin (JARDIANCE) 25 MG TABS tablet Take 25 mg by mouth daily.     ferrous sulfate 325 (65 FE) MG EC tablet Take 325 mg by mouth daily with breakfast.     fluconazole  (DIFLUCAN ) 150 MG tablet Take 1 tablet (150 mg total) by mouth daily. Take 1 tablet day one and repeat 2 days later. 2 tablet  0   FLUoxetine  (PROZAC ) 10 MG capsule Take 10 mg by mouth daily.     fluticasone  (FLONASE ) 50 MCG/ACT nasal spray Place 1 spray into both nostrils daily as needed for allergies or rhinitis.     furosemide (LASIX) 20 MG tablet Take 20 mg by mouth daily.     gabapentin  (NEURONTIN ) 100 MG capsule Take 1 capsule (100 mg total) by mouth daily. 90 capsule 1   hydrochlorothiazide  (HYDRODIURIL ) 12.5 MG tablet Take 12.5 mg by mouth daily.     hydrocortisone  2.5 % cream Apply topically 3 (three) times daily. 30 g 2   Insulin  Asp Prot & Asp FlexPen (NOVOLOG  70/30 MIX) (70-30) 100 UNIT/ML FlexPen Inject into the skin.     lidocaine -prilocaine  (EMLA ) cream Apply small amount to port and cover with saran wrap 1-2 hours prior to port access -daily PRN 30 g 3   lisinopril  (ZESTRIL ) 40 MG tablet Take 40 mg by mouth at bedtime.     loratadine (CLARITIN) 10 MG tablet Take by mouth.     LORazepam  (ATIVAN ) 0.5 MG tablet Take 1 tablet (0.5 mg total) by mouth every 8 (eight) hours as needed for anxiety (nausea vomiting). 30 tablet 0   metFORMIN  (GLUCOPHAGE -XR) 500 MG 24 hr tablet Take 500 mg by mouth at bedtime.     metoprolol  succinate (TOPROL -XL) 25 MG 24 hr tablet Take 25 mg by mouth daily.     mupirocin  ointment (BACTROBAN ) 2 % Apply 1 Application topically 3 (three) times daily. 22 g 0   NOVOLIN 70/30 FLEXPEN (70-30) 100 UNIT/ML KwikPen 50-110 Units See admin instructions. 50 units in the morning, 110 units at bedtime     OZEMPIC , 1 MG/DOSE, 4 MG/3ML SOPN Inject 1 mg into the skin every Tuesday.      rosuvastatin  (CRESTOR ) 10 MG tablet Take 10 mg by mouth daily.     traMADol  (ULTRAM ) 50 MG tablet Take 2 tablets (100 mg total) by mouth every 6 (six) hours as needed. 90 tablet 0   omeprazole (PRILOSEC) 20 MG capsule Take 20 mg by mouth daily. (Patient not taking: Reported on 07/24/2023)     No current facility-administered medications for this visit.   Facility-Administered Medications Ordered in Other Visits  Medication Dose Route Frequency Provider Last Rate Last Admin   heparin  lock flush 100 UNIT/ML injection              PHYSICAL EXAMINATION: ECOG PERFORMANCE STATUS: 1 - Symptomatic but completely ambulatory  Physical Exam Constitutional:      General: She is not in acute distress.    Appearance: She is obese.  HENT:     Head: Normocephalic and atraumatic.  Eyes:     General: No scleral icterus. Cardiovascular:     Rate and Rhythm: Normal rate and regular rhythm.     Heart sounds: Murmur heard.  Pulmonary:     Effort: Pulmonary effort is normal. No respiratory distress.  Abdominal:     General: Bowel sounds are normal. There is no distension.     Palpations: Abdomen is soft.  Musculoskeletal:        General: No deformity. Normal range of motion.     Cervical back: Normal range of motion.     Right lower leg: Edema present.     Left lower leg: Edema present.     Comments:    Skin:    General: Skin is warm and dry.     Findings: No rash.     Comments: Chronic venous  sufficiency skin changes  Neurological:     Mental Status: She is alert and oriented to person, place, and time. Mental status is at baseline.     Cranial Nerves: No cranial nerve deficit.     Coordination: Coordination normal.  Psychiatric:        Mood and Affect: Mood normal.    LABORATORY DATA:  I have reviewed the data as listed    Latest Ref Rng & Units 07/24/2023    8:42 AM 07/03/2023    8:06 AM 06/12/2023    8:05 AM  CBC  WBC 4.0 - 10.5 K/uL 6.1  6.4  6.6   Hemoglobin 12.0 - 15.0 g/dL 52.8   41.3  24.4   Hematocrit 36.0 - 46.0 % 35.5  35.5  36.4   Platelets 150 - 400 K/uL 275  273  303       Latest Ref Rng & Units 07/24/2023    8:42 AM 07/03/2023    8:06 AM 06/12/2023    8:05 AM  CMP  Glucose 70 - 99 mg/dL 010  272  536   BUN 8 - 23 mg/dL 31  43  33   Creatinine 0.44 - 1.00 mg/dL 6.44  0.34  7.42   Sodium 135 - 145 mmol/L 140  137  140   Potassium 3.5 - 5.1 mmol/L 4.4  4.5  4.1   Chloride 98 - 111 mmol/L 107  107  107   CO2 22 - 32 mmol/L 22  19  23    Calcium  8.9 - 10.3 mg/dL 9.9  9.5  59.5   Total Protein 6.5 - 8.1 g/dL 7.1  7.1  7.3   Total Bilirubin 0.0 - 1.2 mg/dL 0.5  0.6  0.5   Alkaline Phos 38 - 126 U/L 57  61  61   AST 15 - 41 U/L 19  22  23    ALT 0 - 44 U/L 18  18  20        RADIOGRAPHIC STUDIES: I have personally reviewed the radiological images as listed and agreed with the findings in the report. No results found.

## 2023-07-24 NOTE — Assessment & Plan Note (Signed)
Continue gabapentin 100mg  daily.

## 2023-07-24 NOTE — Assessment & Plan Note (Signed)
 Treatment plan as listed above.

## 2023-07-24 NOTE — Assessment & Plan Note (Signed)
 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 s/p carboplatin and Taxol for 5 cycles, on Keytruda maintenance.  Labs are reviewed and discussed with patient.  Proceed with Keytruda CT showed NED/stable disease,stable adrenal nodule

## 2023-07-25 ENCOUNTER — Other Ambulatory Visit: Payer: Self-pay

## 2023-07-27 ENCOUNTER — Other Ambulatory Visit: Payer: Self-pay

## 2023-08-04 ENCOUNTER — Other Ambulatory Visit: Payer: Self-pay

## 2023-08-04 ENCOUNTER — Telehealth: Payer: Self-pay | Admitting: Oncology

## 2023-08-04 ENCOUNTER — Encounter (INDEPENDENT_AMBULATORY_CARE_PROVIDER_SITE_OTHER): Payer: Self-pay

## 2023-08-04 DIAGNOSIS — C541 Malignant neoplasm of endometrium: Secondary | ICD-10-CM

## 2023-08-04 NOTE — Telephone Encounter (Signed)
 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 her auth expires on 5/21. In order for auth to be approved for continuation scans within 60 days will need to be provided. her last scans were on 04/17/2023 "   Dr.Yu stated to "obtain CT chest abdomen pelvis wo contrast"  Checked with Bennet Brasil for auth and CT was approved.   I called pt and let her know the above information and transferred call to centralized scheduling to schedule the scan.  CT is scheduled for 08/07/23

## 2023-08-07 ENCOUNTER — Ambulatory Visit
Admission: RE | Admit: 2023-08-07 | Discharge: 2023-08-07 | Disposition: A | Discharge status: Home / Self Care | Source: Ambulatory Visit | Attending: Oncology | Admitting: Oncology

## 2023-08-07 DIAGNOSIS — C541 Malignant neoplasm of endometrium: Secondary | ICD-10-CM | POA: Insufficient documentation

## 2023-08-13 ENCOUNTER — Other Ambulatory Visit: Payer: Self-pay

## 2023-08-14 ENCOUNTER — Inpatient Hospital Stay: Admitting: Oncology

## 2023-08-14 ENCOUNTER — Inpatient Hospital Stay

## 2023-08-14 ENCOUNTER — Encounter: Payer: Self-pay | Admitting: Oncology

## 2023-08-14 VITALS — BP 124/69 | HR 100 | Temp 97.7°F | Resp 18 | Wt 278.9 lb

## 2023-08-14 DIAGNOSIS — C541 Malignant neoplasm of endometrium: Secondary | ICD-10-CM

## 2023-08-14 DIAGNOSIS — L0292 Furuncle, unspecified: Secondary | ICD-10-CM | POA: Diagnosis not present

## 2023-08-14 DIAGNOSIS — Z5112 Encounter for antineoplastic immunotherapy: Secondary | ICD-10-CM

## 2023-08-14 DIAGNOSIS — E278 Other specified disorders of adrenal gland: Secondary | ICD-10-CM

## 2023-08-14 DIAGNOSIS — D631 Anemia in chronic kidney disease: Secondary | ICD-10-CM

## 2023-08-14 DIAGNOSIS — N1831 Chronic kidney disease, stage 3a: Secondary | ICD-10-CM

## 2023-08-14 DIAGNOSIS — N189 Chronic kidney disease, unspecified: Secondary | ICD-10-CM

## 2023-08-14 DIAGNOSIS — G629 Polyneuropathy, unspecified: Secondary | ICD-10-CM

## 2023-08-14 LAB — CBC WITH DIFFERENTIAL/PLATELET
Abs Immature Granulocytes: 0.04 10*3/uL (ref 0.00–0.07)
Basophils Absolute: 0 10*3/uL (ref 0.0–0.1)
Basophils Relative: 0 %
Eosinophils Absolute: 0.2 10*3/uL (ref 0.0–0.5)
Eosinophils Relative: 3 %
HCT: 35 % — ABNORMAL LOW (ref 36.0–46.0)
Hemoglobin: 11.4 g/dL — ABNORMAL LOW (ref 12.0–15.0)
Immature Granulocytes: 1 %
Lymphocytes Relative: 16 %
Lymphs Abs: 1.1 10*3/uL (ref 0.7–4.0)
MCH: 30.5 pg (ref 26.0–34.0)
MCHC: 32.6 g/dL (ref 30.0–36.0)
MCV: 93.6 fL (ref 80.0–100.0)
Monocytes Absolute: 0.7 10*3/uL (ref 0.1–1.0)
Monocytes Relative: 10 %
Neutro Abs: 4.7 10*3/uL (ref 1.7–7.7)
Neutrophils Relative %: 70 %
Platelets: 293 10*3/uL (ref 150–400)
RBC: 3.74 MIL/uL — ABNORMAL LOW (ref 3.87–5.11)
RDW: 13.2 % (ref 11.5–15.5)
WBC: 6.8 10*3/uL (ref 4.0–10.5)
nRBC: 0 % (ref 0.0–0.2)

## 2023-08-14 LAB — COMPREHENSIVE METABOLIC PANEL WITH GFR
ALT: 19 U/L (ref 0–44)
AST: 20 U/L (ref 15–41)
Albumin: 3.8 g/dL (ref 3.5–5.0)
Alkaline Phosphatase: 59 U/L (ref 38–126)
Anion gap: 9 (ref 5–15)
BUN: 36 mg/dL — ABNORMAL HIGH (ref 8–23)
CO2: 20 mmol/L — ABNORMAL LOW (ref 22–32)
Calcium: 9.7 mg/dL (ref 8.9–10.3)
Chloride: 109 mmol/L (ref 98–111)
Creatinine, Ser: 1 mg/dL (ref 0.44–1.00)
GFR, Estimated: 60 mL/min — ABNORMAL LOW (ref >60)
Glucose, Bld: 134 mg/dL — ABNORMAL HIGH (ref 70–99)
Potassium: 4 mmol/L (ref 3.5–5.1)
Sodium: 138 mmol/L (ref 135–145)
Total Bilirubin: 0.3 mg/dL (ref 0.0–1.2)
Total Protein: 7.4 g/dL (ref 6.5–8.1)

## 2023-08-14 LAB — TSH: TSH: 1.389 u[IU]/mL (ref 0.350–4.500)

## 2023-08-14 MED ORDER — SODIUM CHLORIDE 0.9 % IV SOLN
200.0000 mg | Freq: Once | INTRAVENOUS | Status: AC
  Administered 2023-08-14: 200 mg via INTRAVENOUS
  Filled 2023-08-14: qty 8

## 2023-08-14 MED ORDER — HEPARIN SOD (PORK) LOCK FLUSH 100 UNIT/ML IV SOLN
500.0000 [IU] | Freq: Once | INTRAVENOUS | Status: AC | PRN
  Administered 2023-08-14: 500 [IU]
  Filled 2023-08-14: qty 5

## 2023-08-14 MED ORDER — CEPHALEXIN 500 MG PO CAPS: 500.0000 mg | ORAL_CAPSULE | Freq: Four times a day (QID) | ORAL | 0 refills | Status: DC

## 2023-08-14 MED ORDER — SODIUM CHLORIDE 0.9 % IV SOLN
Freq: Once | INTRAVENOUS | Status: AC
  Filled 2023-08-14: qty 250

## 2023-08-14 MED ORDER — SODIUM CHLORIDE 0.9% FLUSH
10.0000 mL | INTRAVENOUS | Status: DC | PRN
  Administered 2023-08-14: 10 mL
  Filled 2023-08-14: qty 10

## 2023-08-14 NOTE — Assessment & Plan Note (Signed)
 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 s/p carboplatin and Taxol for 5 cycles, on Keytruda maintenance.  Labs are reviewed and discussed with patient.  Proceed with Keytruda CT showed NED/stable disease,stable adrenal nodule

## 2023-08-14 NOTE — Progress Notes (Signed)
 Hematology/Oncology Progress note [phone] [phone]      CHIEF COMPLAINTS/REASON FOR VISIT:  Follow up for endometrial cancer  ASSESSMENT & PLAN:   Cancer Staging  Endometrial adenocarcinoma [facility]) Staging form: Corpus Uteri - Carcinoma and Carcinosarcoma, AJCC 8th Edition - Clinical stage from 04/10/2021: FIGO Stage III, calculated as Stage Unknown (cT3, cNX, cM0) - Signed by Timmy Forbes, MD on 08/24/2021   Endometrial adenocarcinoma (HCC) FIGO grade 3 poorly differentiated endometrial adenocarcinoma. s/p carboplatin  and Taxol  for 5 cycles, on Keytruda  maintenance.  Labs are reviewed and discussed with patient.  Proceed with Keytruda  CT showed NED/stable disease,stable adrenal nodule  Adrenal mass (HCC) lipid poor hyperfunctioning adenoma vs mets.  FDG avid on PET scan.  Difficult biopsy due to patient's body habitus and deep position of the mass. Resolved on subsequent PET scan after treatments, suggesting that this lesion might be a metastatic lesion. CT showed stable lesion  Continue monitor   Anemia in chronic kidney disease (CKD) Stable hemoglobin  CKD (chronic kidney disease) stage 3, GFR 30-59 ml/min (HCC) Encourage oral hydration and avoid nephrotoxins.  monitor  Encounter for antineoplastic immunotherapy Treatment plan as listed above.   Furunculosis Recommend oral Keflex 500mg  QID for 5 days  Neuropathy Continue  gabapentin  100mg  daily    No orders of the defined types were placed in this encounter.   Return of visit:  3 weeks Lab MD Keytruda   Timmy Forbes, MD, [name] [facility] Hematology Oncology 08/14/2023     HISTORY OF PRESENTING ILLNESS:   Valerie Case is a  73 y.o.  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 Oncology History  Endometrial adenocarcinoma (HCC)  12/12/2020 Imaging   12/12/2020, CT abdomen pelvis with contrast showed no radiographic evidence of  urinary tract neoplasm, calculi, hydronephrosis.  2.6 cm nonspecific left adrenal mass. 02/12/2021 CT hematuria work-up showed small uterine fibroids, no findings to explain hematuria.Hepatomegaly, diverticulosis without evidence of diverticulitis, Coronary artery disease   04/01/2021 -  Hospital Admission   04/01/2021 - 04/03/2021, patient was hospitalized due to symptomatic anemia, hemoglobin 6.9, heavy postmenopausal bleeding with passing large clots.  She also presented with increased creatinine level to 2.58 compared to her baseline level of 0.9 in November 2022.  Patient received IV iron  infusion and 2 units of PRBC during the hospital stay..  04/02/2021, iron  panel showed iron  saturation 37, ferritin 37, TIBC 333-the studies were done after patient received blood transfusion.  At discharge, hemoglobin was 7.7.   04/01/2021 Initial Diagnosis   04/01/2020, endometrial biopsy showed poorly differentiated endometrial adenocarcinoma. Omniseq NGS showed TMB 48.4 mt/mb [high], MSI High, PD-L1-TPS <1%, PIK3CA H1047Q, Negative for BRAF, HER2, NTRK1 RET.     04/10/2021 Cancer Staging   Staging form: Corpus Uteri - Carcinoma and Carcinosarcoma, AJCC 8th Edition - Clinical stage from 04/10/2021: FIGO Stage III, calculated as Stage Unknown (cT3, cNX, cM0) - Signed by Timmy Forbes, MD on 08/24/2021 Stage prefix: Initial diagnosis   04/15/2021 Imaging   PET showed 1. Large hypermetabolic endometrial mass consistent with known endometrial cancer. Suspect direct invasion/involvement of the right adnexa. Right pelvic sidewall hypermetabolic adenopathy.2. 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 3. No findings for metastatic disease involving the chest or bonystructures.     04/24/2021 - 05/29/2021 Radiation Therapy   status post radiation to pelvis   08/05/2021 Imaging   MRI abdomen w wo contrast 1.  Stable solid enhancing  2.7 cm left adrenal lesion, which was hypermetabolic on prior PET/CTs but is unchanged in size dating back to December 12, 2020. 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 However, unfortunately metastatic disease can not be entirely excluded and remains a pertinent differential consideration. Comparison with more remote prior imaging would be the most valuable tool in the assessment of this lesion, as demonstrating long-term stability would indicate this to be a benign lesion. However, if no prior imaging can be made available, would consider follow-up adrenal protocol CT with and without intravenous contrast material in 3 months as this would allow for further assessment of stability as well as enhancement and washout characteristics of the lesion potentially allowing for better characterization versus direct tissue sampling.2. Hepatomegaly and hepatic steatosis.3. Colonic diverticulosis without findings of acute diverticulitis    08/16/2021 - 09/27/2021 Chemotherapy   UTERINE Carboplatin  AUC 5 / Paclitaxel  q21d x 3      10/03/2021 Imaging   PET 1. No residual hypermetabolism in the uterus suggesting an excellent response to treatment. No findings for metastatic disease. 2. Resolution of hypermetabolism in the left adrenal gland lesion suggesting this was metastatic disease. 3. Diffuse marrow hypermetabolism likely due to rebound from chemotherapy or marrow stimulating drugs   10/18/2021 - 11/08/2021 Chemotherapy   AUC 5 / Paclitaxel / Keytruda  Q21d  x 2 cycles   11/29/2021 -  Chemotherapy   Patient is on Treatment Plan : UTERINE Pembrolizumab  (200) q21d     04/30/2022 Imaging   CT chest abdomen pelvis w contrast  1. Similar to minimal decrease in size of a left adrenal nodule which was felt suspicious for metastatic disease on  10/03/2021 PET. 2. No evidence of new or progressive disease. 3. Coronary artery atherosclerosis. Aortic Atherosclerosis (ICD10-I70.0). 4. Incidental findings, including: Hepatomegaly. Possible constipation.   08/28/2022 Imaging   CT chest abdomen pelvis w contrast showed 1. Similar appearance of indeterminate left adrenal nodule. 2. Otherwise, no evidence of metastatic disease. 3. Hepatomegaly 4.  Possible constipation. 5. Coronary artery atherosclerosis. Aortic Atherosclerosis (ICD10-I70.0).   12/22/2022 Imaging   CT chest abdomen pelvis w contrast showed 1. Stable indeterminate left adrenal nodule. 2. Otherwise, no noncontrast enhanced CT evidence of metastatic disease. 3. Colonic diverticulosis without findings of acute diverticulitis. 4.  Aortic Atherosclerosis (ICD10-I70.0).     04/17/2023 Imaging   CT chest abdomen pelvis wo contrast  1. Similar size of the indeterminate left adrenal nodule which is unchanged back to 06/10/2021. Felt unlikely to represent metastatic disease. Recommend attention on follow-up. 2. Otherwise, no evidence of metastatic disease. 3. Aortic valvular calcifications. Consider echocardiography to evaluate for valvular dysfunction. 4.  Aortic Atherosclerosis (ICD10-I70.0).       INTERVAL HISTORY Valerie Case is a 73 y.o. female who has above history reviewed by me today presents for follow up visit for anemia and endometrial cancer Overall she tolerates treatment Today Patient denies SOB,  nausea vomiting diarrhea. Boil on her upper back.    Review of Systems  Constitutional:  Positive for fatigue. Negative for chills and fever.  HENT:   Negative for hearing loss and voice change.   Eyes:  Negative for eye problems.  Respiratory:  Negative for chest tightness and cough.   Cardiovascular:  Negative for chest pain.  Gastrointestinal:  Negative for abdominal distention, abdominal pain and blood in stool.  Endocrine: Negative for hot  flashes.  Genitourinary:  Negative for difficulty urinating, frequency and vaginal bleeding.   Musculoskeletal:  Positive for arthralgias.  Skin:  Negative for itching and rash.       Boil   Neurological:  Negative for extremity weakness and headaches.  Hematological:  Negative for adenopathy.  Psychiatric/Behavioral:  Negative for confusion.     MEDICAL HISTORY:  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   Mixed hyperlipidemia    Peripheral polyneuropathy    Sleep apnea     SURGICAL HISTORY: Past Surgical History:  Procedure Laterality Date   APPENDECTOMY     BIOPSY  03/17/2023   Procedure: BIOPSY;  Surgeon: Quintin Buckle, DO;  Location: [facility] ENDOSCOPY;  Service: Gastroenterology;;   CESAREAN SECTION  01/23/1979   COLONOSCOPY     COLONOSCOPY N/A 03/17/2023   Procedure: COLONOSCOPY;  Surgeon: Quintin Buckle, DO;  Location[facility] ENDOSCOPY;  Service: Gastroenterology;  Laterality: N/A;   COLONOSCOPY WITH PROPOFOL  N/A 03/13/2023   Procedure: COLONOSCOPY WITH PROPOFOL ;  Surgeon: Quintin Buckle, DO;  Location: [facility] ENDOSCOPY;  Service: Gastroenterology;  Laterality: N/A;   ESOPHAGOGASTRODUODENOSCOPY     ESOPHAGOGASTRODUODENOSCOPY N/A 03/17/2023   Procedure: ESOPHAGOGASTRODUODENOSCOPY (EGD);  Surgeon: Quintin Buckle, DO;  Location: [facility] ENDOSCOPY;  Service: Gastroenterology;  Laterality: N/A;   ESOPHAGOGASTRODUODENOSCOPY (EGD) WITH PROPOFOL  N/A 03/13/2023   Procedure: ESOPHAGOGASTRODUODENOSCOPY (EGD) WITH  PROPOFOL ;  Surgeon: Quintin Buckle, DO;  Location: [facility] ENDOSCOPY;  Service: Gastroenterology;  Laterality: N/A;   IR IMAGING GUIDED PORT INSERTION  08/09/2021   TONSILLECTOMY  03/17/1955    SOCIAL HISTORY: Social History   Socioeconomic History   Marital status: Divorced    Spouse name: Not on file   Number of children: Not on file   Years of education: Not on file   Highest education level: Not on file  Occupational History   Not on file  Tobacco Use   Smoking status: Never   Smokeless tobacco: Never  Vaping Use   Vaping status: Never Used  Substance and Sexual Activity   Alcohol use: Not Currently   Drug use: Never   Sexual activity: Not Currently  Other Topics Concern   Not on file  Social History Narrative      Social Drivers of Health   Financial Resource Strain: Low Risk  (05/19/2023)   Received [facility]   Overall Financial Resource Strain (CARDIA)    Difficulty of Paying Living Expenses: Not hard at all  Food Insecurity: No Food Insecurity (05/19/2023)   Received from [facility]   Hunger Vital Sign    Worried About Running Out of Food in the Last Year: Never true    Ran Out of Food in the Last Year: Never true  Transportation Needs: No Transportation Needs (05/19/2023)   Received from [facility] - Transportation    In the past 12  months, has lack of transportation kept you from medical appointments or from getting medications?: No    Lack of Transportation (Non-Medical): No  Physical Activity: Inactive (09/27/2021)   Exercise Vital Sign    Days of Exercise per Week: 0 days    Minutes of Exercise per Session: 0 min  Stress: Stress Concern Present (09/27/2021)   [employer] of Occupational Health - Occupational Stress Questionnaire    Feeling of Stress : Rather much  Social Connections: Moderately Integrated (09/27/2021)   Social Connection and Isolation Panel [NHANES]     Frequency of Communication with Friends and Family: Three times a week    Frequency of Social Gatherings with Friends and Family: Three times a week    Attends Religious Services: 1 to 4 times per year    Active Member of Clubs or Organizations: No    Attends Banker Meetings: 1 to 4 times per year    Marital Status: Divorced  Catering manager Violence: Not At Risk (09/27/2021)   Humiliation, Afraid, Rape, and Kick questionnaire    Fear of Current or Ex-Partner: No    Emotionally Abused: No    Physically Abused: No    Sexually Abused: No    FAMILY HISTORY: Family History  Problem Relation Age of Onset   Breast cancer Mother 53   Cancer Mother    Cancer Father    Lung cancer Father    Breast cancer Cousin        dx 41s maternal    ALLERGIES:  is allergic to doxycycline , omeprazole, and aspirin.  MEDICATIONS:  Current Outpatient Medications  Medication Sig Dispense Refill   acetaminophen  (TYLENOL ) 650 MG CR tablet Take 1,300 mg by mouth every 8 (eight) hours as needed for pain.     albuterol  (VENTOLIN  HFA) 108 (90 Base) MCG/ACT inhaler Inhale 2 puffs into the lungs every 6 (six) hours as needed for wheezing or shortness of breath.     azelastine  (ASTELIN ) 0.1 % nasal spray Place 2 sprays into both nostrils daily. Use in each nostril as directed 30 mL 0   cephALEXin (KEFLEX) 500 MG capsule Take 1 capsule (500 mg total) by mouth 4 (four) times daily. 20 capsule 0   Cranberry-Vitamin C-Probiotic (AZO CRANBERRY PO) Take by mouth.     docusate sodium  (COLACE) 100 MG capsule Take 1 capsule (100 mg total) by mouth 2 (two) times daily. 60 capsule 1   empagliflozin (JARDIANCE) 25 MG TABS tablet Take 25 mg by mouth daily.     ferrous sulfate 325 (65 FE) MG EC tablet Take 325 mg by mouth daily with breakfast.     FLUoxetine  (PROZAC ) 10 MG capsule Take 10 mg by mouth daily.     fluticasone  (FLONASE ) 50 MCG/ACT nasal spray Place 1 spray into both nostrils daily as needed for  allergies or rhinitis.     furosemide (LASIX) 20 MG tablet Take 20 mg by mouth daily.     gabapentin  (NEURONTIN ) 100 MG capsule Take 1 capsule (100 mg total) by mouth daily. 90 capsule 1   hydrochlorothiazide  (HYDRODIURIL ) 12.5 MG tablet Take 12.5 mg by mouth daily.     hydrocortisone  2.5 % cream Apply topically 3 (three) times daily. 30 g 2   Insulin  Asp Prot & Asp FlexPen (NOVOLOG  70/30 MIX) (70-30) 100 UNIT/ML FlexPen Inject into the skin.     lidocaine -prilocaine  (EMLA ) cream Apply small amount to port and cover with saran wrap 1-2 hours prior to port access -daily PRN 30 g  3   lisinopril  (ZESTRIL ) 40 MG tablet Take 40 mg by mouth at bedtime.     loratadine (CLARITIN) 10 MG tablet Take by mouth.     LORazepam  (ATIVAN ) 0.5 MG tablet Take 1 tablet (0.5 mg total) by mouth every 8 (eight) hours as needed for anxiety (nausea vomiting). 30 tablet 0   metFORMIN  (GLUCOPHAGE -XR) 500 MG 24 hr tablet Take 500 mg by mouth at bedtime.     metoprolol  succinate (TOPROL -XL) 25 MG 24 hr tablet Take 25 mg by mouth daily.     mupirocin  ointment (BACTROBAN ) 2 % Apply 1 Application topically 3 (three) times daily. 22 g 0   NOVOLIN 70/30 FLEXPEN (70-30) 100 UNIT/ML KwikPen 50-110 Units See admin instructions. 50 units in the morning, 110 units at bedtime     OZEMPIC , 1 MG/DOSE, 4 MG/3ML SOPN Inject 1 mg into the skin every Tuesday.     rosuvastatin  (CRESTOR ) 10 MG tablet Take 10 mg by mouth daily.     traMADol  (ULTRAM ) 50 MG tablet Take 2 tablets (100 mg total) by mouth every 6 (six) hours as needed. 90 tablet 0   fluconazole  (DIFLUCAN ) 150 MG tablet Take 1 tablet (150 mg total) by mouth daily. Take 1 tablet day one and repeat 2 days later. (Patient not taking: Reported on 08/14/2023) 2 tablet 0   omeprazole (PRILOSEC) 20 MG capsule Take 20 mg by mouth daily. (Patient not taking: Reported on 06/12/2023)     No current facility-administered medications for this visit.   Facility-Administered Medications Ordered in  Other Visits  Medication Dose Route Frequency Provider Last Rate Last Admin   heparin  lock flush 100 UNIT/ML injection            sodium chloride  flush (NS) 0.9 % injection 10 mL  10 mL Intracatheter PRN Timmy Forbes, MD   10 mL at 08/14/23 1052     PHYSICAL EXAMINATION: ECOG PERFORMANCE STATUS: 1 - Symptomatic but completely ambulatory  Physical Exam Constitutional:      General: She is not in acute distress.    Appearance: She is obese.  HENT:     Head: Normocephalic and atraumatic.  Eyes:     General: No scleral icterus. Cardiovascular:     Rate and Rhythm: Normal rate and regular rhythm.     Heart sounds: Murmur heard.  Pulmonary:     Effort: Pulmonary effort is normal. No respiratory distress.  Abdominal:     General: Bowel sounds are normal. There is no distension.     Palpations: Abdomen is soft.  Musculoskeletal:        General: No deformity. Normal range of motion.     Cervical back: Normal range of motion.     Right lower leg: Edema present.     Left lower leg: Edema present.     Comments:    Skin:    General: Skin is warm and dry.     Findings: No rash.     Comments: Chronic venous sufficiency skin changes Upper back 4cm  tender, red lump with surrounding erythema  Neurological:     Mental Status: She is alert and oriented to person, place, and time. Mental status is at baseline.     Coordination: Coordination abnormal.  Psychiatric:        Mood and Affect: Mood normal.    LABORATORY DATA:  I have reviewed the data as listed    Latest Ref Rng & Units 08/14/2023    8:13 AM 07/24/2023  8:42 AM 07/03/2023    8:06 AM  CBC  WBC 4.0 - 10.5 K/uL 6.8  6.1  6.4   Hemoglobin 12.0 - 15.0 g/dL 16.1  09.6  04.5   Hematocrit 36.0 - 46.0 % 35.0  35.5  35.5   Platelets 150 - 400 K/uL 293  275  273       Latest Ref Rng & Units 08/14/2023    8:13 AM 07/24/2023    8:42 AM 07/03/2023    8:06 AM  CMP  Glucose 70 - 99 mg/dL 409  811  914   BUN 8 - 23 mg/dL 36  31  43    Creatinine 0.44 - 1.00 mg/dL 7.82  9.56  2.13   Sodium 135 - 145 mmol/L 138  140  137   Potassium 3.5 - 5.1 mmol/L 4.0  4.4  4.5   Chloride 98 - 111 mmol/L 109  107  107   CO2 22 - 32 mmol/L 20  22  19    Calcium  8.9 - 10.3 mg/dL 9.7  9.9  9.5   Total Protein 6.5 - 8.1 g/dL 7.4  7.1  7.1   Total Bilirubin 0.0 - 1.2 mg/dL 0.3  0.5  0.6   Alkaline Phos 38 - 126 U/L 59  57  61   AST 15 - 41 U/L 20  19  22    ALT 0 - 44 U/L 19  18  18        RADIOGRAPHIC STUDIES: I have personally reviewed the radiological images as listed and agreed with the findings in the report. CT CHEST ABDOMEN PELVIS WO CONTRAST Result Date: 08/09/2023 EXAMINATION: CT CHEST ABDOMEN PELVIS WO CONTRAST CLINICAL INDICATION: Female, 73 years old. hx of endometrial cancer TECHNIQUE: Helical CT scan examination of the chest, abdomen, and pelvis is performed from the domes of the diaphragm to the pubic symphysis. Unless otherwise specified, incidental thyroid , adrenal, renal lesions do not require dedicated imaging follow up. Additionally, any mentioned pulmonary nodules do not require dedicated imaging follow-up based on the Fleischner guidelines unless otherwise specified. Coronary calcifications are not identified unless otherwise specified. COMPARISON: 04/17/2023 FINDINGS: CHEST: Right chest wall Mediport catheter tip terminates in the right atrium. The thyroid  is normal. The thoracic aorta is normal in caliber. The main pulmonary artery is normal in caliber. The heart is normal in size. There are coronary calcifications with calcifications of the aortic valve. There is no free fluid or pathologic adenopathy by size criteria. The trachea and mainstem bronchi are patent. Subsegmental atelectatic changes are noted within the lung bases. The lungs are otherwise clear. ABDOMEN/PELVIS: The liver contains a subcentimeter probable cyst. The gallbladder is normal. The spleen is normal. The pancreas is normal. The right adrenal is normal.  Stable left adrenal nodule compatible with a benign nodule most likely indicative of a lipid poor adenoma. The kidneys are normal. The abdominal aorta is normal in caliber. Scattered calcified atherosclerotic changes are present. The bladder is normal. The uterus is normal. There is colonic diverticulosis. Large and small bowel loops are otherwise within normal limits. No free fluid or lymphadenopathy. BONES: There are degenerative changes of the spine and bony pelvis. No concerning osseous lesions. IMPRESSION: No evidence for metastatic disease within the chest, abdomen, or pelvis. DOSE REDUCTION: All CT scans are performed using radiation dose reduction techniques, when applicable. Technical factors are evaluated and adjusted to ensure appropriate moderation of exposure. Electronically signed by: [place] Engel MD 08/09/2023 09:50 AM EDT RP Workstation: [id number]

## 2023-08-14 NOTE — Patient Instructions (Signed)

## 2023-08-14 NOTE — Assessment & Plan Note (Signed)
 Stable hemoglobin

## 2023-08-14 NOTE — Assessment & Plan Note (Signed)
 lipid poor hyperfunctioning adenoma vs mets.  FDG avid on PET scan.  Difficult biopsy due to patient's body habitus and deep position of the mass. Resolved on subsequent PET scan after treatments, suggesting that this lesion might be a metastatic lesion. CT showed stable lesion  Continue monitor

## 2023-08-14 NOTE — Assessment & Plan Note (Signed)
 Encourage oral hydration and avoid nephrotoxins.  monitor

## 2023-08-14 NOTE — Assessment & Plan Note (Signed)
 Recommend oral Keflex  500mg  QID for 5 days

## 2023-08-14 NOTE — Assessment & Plan Note (Signed)
 Treatment plan as listed above.

## 2023-08-14 NOTE — Assessment & Plan Note (Signed)
Continue gabapentin 100mg  daily.

## 2023-08-16 ENCOUNTER — Other Ambulatory Visit: Payer: Self-pay

## 2023-09-02 ENCOUNTER — Other Ambulatory Visit: Payer: Self-pay

## 2023-09-04 ENCOUNTER — Inpatient Hospital Stay: Attending: Obstetrics and Gynecology

## 2023-09-04 ENCOUNTER — Inpatient Hospital Stay

## 2023-09-04 ENCOUNTER — Encounter: Payer: Self-pay | Admitting: Oncology

## 2023-09-04 ENCOUNTER — Inpatient Hospital Stay: Attending: Obstetrics and Gynecology | Admitting: Oncology

## 2023-09-04 VITALS — BP 96/59 | HR 105 | Temp 98.3°F | Resp 18 | Wt 278.1 lb

## 2023-09-04 VITALS — BP 122/62 | HR 84 | Resp 18

## 2023-09-04 DIAGNOSIS — N183 Chronic kidney disease, stage 3 unspecified: Secondary | ICD-10-CM | POA: Diagnosis not present

## 2023-09-04 DIAGNOSIS — M2550 Pain in unspecified joint: Secondary | ICD-10-CM | POA: Insufficient documentation

## 2023-09-04 DIAGNOSIS — Z886 Allergy status to analgesic agent status: Secondary | ICD-10-CM | POA: Insufficient documentation

## 2023-09-04 DIAGNOSIS — Z801 Family history of malignant neoplasm of trachea, bronchus and lung: Secondary | ICD-10-CM | POA: Insufficient documentation

## 2023-09-04 DIAGNOSIS — N179 Acute kidney failure, unspecified: Secondary | ICD-10-CM | POA: Diagnosis not present

## 2023-09-04 DIAGNOSIS — Z9049 Acquired absence of other specified parts of digestive tract: Secondary | ICD-10-CM | POA: Insufficient documentation

## 2023-09-04 DIAGNOSIS — E1122 Type 2 diabetes mellitus with diabetic chronic kidney disease: Secondary | ICD-10-CM | POA: Insufficient documentation

## 2023-09-04 DIAGNOSIS — R5383 Other fatigue: Secondary | ICD-10-CM | POA: Insufficient documentation

## 2023-09-04 DIAGNOSIS — L0292 Furuncle, unspecified: Secondary | ICD-10-CM

## 2023-09-04 DIAGNOSIS — J45909 Unspecified asthma, uncomplicated: Secondary | ICD-10-CM | POA: Diagnosis not present

## 2023-09-04 DIAGNOSIS — Z803 Family history of malignant neoplasm of breast: Secondary | ICD-10-CM | POA: Diagnosis not present

## 2023-09-04 DIAGNOSIS — E860 Dehydration: Secondary | ICD-10-CM

## 2023-09-04 DIAGNOSIS — R7989 Other specified abnormal findings of blood chemistry: Secondary | ICD-10-CM | POA: Insufficient documentation

## 2023-09-04 DIAGNOSIS — I700 Atherosclerosis of aorta: Secondary | ICD-10-CM | POA: Diagnosis not present

## 2023-09-04 DIAGNOSIS — I2510 Atherosclerotic heart disease of native coronary artery without angina pectoris: Secondary | ICD-10-CM | POA: Diagnosis not present

## 2023-09-04 DIAGNOSIS — D631 Anemia in chronic kidney disease: Secondary | ICD-10-CM | POA: Insufficient documentation

## 2023-09-04 DIAGNOSIS — K5730 Diverticulosis of large intestine without perforation or abscess without bleeding: Secondary | ICD-10-CM | POA: Insufficient documentation

## 2023-09-04 DIAGNOSIS — C541 Malignant neoplasm of endometrium: Secondary | ICD-10-CM | POA: Insufficient documentation

## 2023-09-04 DIAGNOSIS — Z9089 Acquired absence of other organs: Secondary | ICD-10-CM | POA: Insufficient documentation

## 2023-09-04 DIAGNOSIS — Z79899 Other long term (current) drug therapy: Secondary | ICD-10-CM | POA: Insufficient documentation

## 2023-09-04 DIAGNOSIS — K760 Fatty (change of) liver, not elsewhere classified: Secondary | ICD-10-CM | POA: Diagnosis not present

## 2023-09-04 DIAGNOSIS — I129 Hypertensive chronic kidney disease with stage 1 through stage 4 chronic kidney disease, or unspecified chronic kidney disease: Secondary | ICD-10-CM | POA: Diagnosis not present

## 2023-09-04 DIAGNOSIS — Z923 Personal history of irradiation: Secondary | ICD-10-CM | POA: Diagnosis not present

## 2023-09-04 DIAGNOSIS — N1831 Chronic kidney disease, stage 3a: Secondary | ICD-10-CM

## 2023-09-04 DIAGNOSIS — Z881 Allergy status to other antibiotic agents status: Secondary | ICD-10-CM | POA: Insufficient documentation

## 2023-09-04 DIAGNOSIS — Z86018 Personal history of other benign neoplasm: Secondary | ICD-10-CM | POA: Insufficient documentation

## 2023-09-04 DIAGNOSIS — Z95828 Presence of other vascular implants and grafts: Secondary | ICD-10-CM

## 2023-09-04 DIAGNOSIS — Z8719 Personal history of other diseases of the digestive system: Secondary | ICD-10-CM | POA: Diagnosis not present

## 2023-09-04 DIAGNOSIS — R160 Hepatomegaly, not elsewhere classified: Secondary | ICD-10-CM | POA: Insufficient documentation

## 2023-09-04 LAB — CBC WITH DIFFERENTIAL/PLATELET
Abs Immature Granulocytes: 0.06 10*3/uL (ref 0.00–0.07)
Basophils Absolute: 0 10*3/uL (ref 0.0–0.1)
Basophils Relative: 1 %
Eosinophils Absolute: 0.2 10*3/uL (ref 0.0–0.5)
Eosinophils Relative: 3 %
HCT: 37.1 % (ref 36.0–46.0)
Hemoglobin: 12 g/dL (ref 12.0–15.0)
Immature Granulocytes: 1 %
Lymphocytes Relative: 22 %
Lymphs Abs: 1.5 10*3/uL (ref 0.7–4.0)
MCH: 30.8 pg (ref 26.0–34.0)
MCHC: 32.3 g/dL (ref 30.0–36.0)
MCV: 95.1 fL (ref 80.0–100.0)
Monocytes Absolute: 0.7 10*3/uL (ref 0.1–1.0)
Monocytes Relative: 10 %
Neutro Abs: 4.5 10*3/uL (ref 1.7–7.7)
Neutrophils Relative %: 63 %
Platelets: 281 10*3/uL (ref 150–400)
RBC: 3.9 MIL/uL (ref 3.87–5.11)
RDW: 13.2 % (ref 11.5–15.5)
WBC: 7.1 10*3/uL (ref 4.0–10.5)
nRBC: 0 % (ref 0.0–0.2)

## 2023-09-04 LAB — COMPREHENSIVE METABOLIC PANEL WITH GFR
ALT: 18 U/L (ref 0–44)
AST: 24 U/L (ref 15–41)
Albumin: 4 g/dL (ref 3.5–5.0)
Alkaline Phosphatase: 55 U/L (ref 38–126)
Anion gap: 13 (ref 5–15)
BUN: 39 mg/dL — ABNORMAL HIGH (ref 8–23)
CO2: 18 mmol/L — ABNORMAL LOW (ref 22–32)
Calcium: 10.7 mg/dL — ABNORMAL HIGH (ref 8.9–10.3)
Chloride: 105 mmol/L (ref 98–111)
Creatinine, Ser: 1.25 mg/dL — ABNORMAL HIGH (ref 0.44–1.00)
GFR, Estimated: 46 mL/min — ABNORMAL LOW (ref >60)
Glucose, Bld: 126 mg/dL — ABNORMAL HIGH (ref 70–99)
Potassium: 4.8 mmol/L (ref 3.5–5.1)
Sodium: 136 mmol/L (ref 135–145)
Total Bilirubin: 0.5 mg/dL (ref 0.0–1.2)
Total Protein: 7.6 g/dL (ref 6.5–8.1)

## 2023-09-04 LAB — TSH: TSH: 1.542 u[IU]/mL (ref 0.350–4.500)

## 2023-09-04 MED ORDER — SODIUM CHLORIDE 0.9 % IV SOLN
Freq: Once | INTRAVENOUS | Status: AC
  Filled 2023-09-04: qty 250

## 2023-09-04 MED ORDER — HEPARIN SOD (PORK) LOCK FLUSH 100 UNIT/ML IV SOLN
500.0000 [IU] | Freq: Once | INTRAVENOUS | Status: AC
  Administered 2023-09-04: 500 [IU] via INTRAVENOUS
  Filled 2023-09-04: qty 5

## 2023-09-04 NOTE — Assessment & Plan Note (Signed)
 Encourage oral hydration and avoid nephrotoxins.  Acute increase of creatinine, likely due to dehydration.  Patient will get IV fluid 1L of NS today.

## 2023-09-04 NOTE — Assessment & Plan Note (Signed)
 Finish course of Keflex  500mg  QID for 5 days. Improved.

## 2023-09-04 NOTE — Assessment & Plan Note (Addendum)
 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 s/p carboplatin  and Taxol  for 5 cycles, on Keytruda  maintenance.  Labs are reviewed and discussed with patient.  hold Keytruda  due to acute kidney failure. CT showed NED/stable disease,stable adrenal nodule

## 2023-09-04 NOTE — Progress Notes (Signed)
 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 s/p carboplatin  and Taxol  for 5 cycles, on Keytruda  maintenance.  Labs are reviewed and discussed with patient.  hold Keytruda  due to acute kidney failure. CT showed NED/stable disease,stable adrenal nodule  Furunculosis Finish course of Keflex  500mg  QID for 5 days. Improved.  CKD (chronic kidney disease) stage 3, GFR 30-59 ml/min (HCC) Encourage oral hydration and avoid nephrotoxins.  Acute increase of creatinine, likely due to dehydration.  Patient will get IV fluid 1L of NS today.     Orders Placed This Encounter  Procedures   CBC with Differential    Standing Status:   Future    Expected Date:   09/11/2023    Expiration Date:   09/10/2024   Comprehensive metabolic panel    Standing Status:   Future    Expected Date:   09/11/2023    Expiration Date:   09/10/2024   TSH    Standing Status:   Future    Expected Date:   09/11/2023    Expiration Date:   09/10/2024    Return of visit:  1 week Lab MD Keytruda   Timmy Forbes, MD, [name] [facility] Hematology Oncology 09/04/2023     HISTORY OF PRESENTING ILLNESS:   Valerie Case is a  73 y.o.  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 Oncology History  Endometrial adenocarcinoma (HCC)  12/12/2020 Imaging   12/12/2020, CT abdomen pelvis with contrast showed no radiographic evidence of urinary tract neoplasm, calculi, hydronephrosis.  2.6 cm  nonspecific left adrenal mass. 02/12/2021 CT hematuria work-up showed small uterine fibroids, no findings to explain hematuria.Hepatomegaly, diverticulosis without evidence of diverticulitis, Coronary artery disease   04/01/2021 -  Hospital Admission   04/01/2021 - 04/03/2021, patient was hospitalized due to symptomatic anemia, hemoglobin 6.9, heavy postmenopausal bleeding with passing large clots.  She also presented with increased creatinine level to 2.58 compared to her baseline level of 0.9 in November 2022.  Patient received IV iron  infusion and 2 units of PRBC during the hospital stay..  04/02/2021, iron  panel showed iron  saturation 37, ferritin 37, TIBC 333-the studies were done after patient received blood transfusion.  At discharge, hemoglobin was 7.7.   04/01/2021 Initial Diagnosis   04/01/2020, endometrial biopsy showed poorly differentiated endometrial adenocarcinoma. Omniseq NGS showed TMB 48.4 mt/mb [high], MSI High, PD-L1-TPS <1%, PIK3CA H1047Q, Negative for BRAF, HER2, NTRK1 RET.     04/10/2021 Cancer Staging   Staging form: Corpus Uteri - Carcinoma and Carcinosarcoma, AJCC 8th Edition - Clinical stage from 04/10/2021: FIGO Stage III, calculated as Stage Unknown (cT3, cNX, cM0) - Signed by Timmy Forbes, MD on 08/24/2021 Stage prefix: Initial diagnosis   04/15/2021 Imaging   PET showed 1. Large hypermetabolic endometrial mass consistent with known endometrial cancer. Suspect direct invasion/involvement of the right adnexa. Right pelvic sidewall hypermetabolic adenopathy.2. 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 3. No findings for metastatic disease involving the chest or bonystructures.  04/24/2021 - 05/29/2021 Radiation Therapy   status post radiation to pelvis   08/05/2021 Imaging   MRI abdomen w wo contrast 1. Stable solid enhancing 2.7 cm left adrenal lesion, which was hypermetabolic on  prior PET/CTs but is unchanged in size dating back to December 12, 2020. 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 However, unfortunately metastatic disease can not be entirely excluded and remains a pertinent differential consideration. Comparison with more remote prior imaging would be the most valuable tool in the assessment of this lesion, as demonstrating long-term stability would indicate this to be a benign lesion. However, if no prior imaging can be made available, would consider follow-up adrenal protocol CT with and without intravenous contrast material in 3 months as this would allow for further assessment of stability as well as enhancement and washout characteristics of the lesion potentially allowing for better characterization versus direct tissue sampling.2. Hepatomegaly and hepatic steatosis.3. Colonic diverticulosis without findings of acute diverticulitis    08/16/2021 - 09/27/2021 Chemotherapy   UTERINE Carboplatin  AUC 5 / Paclitaxel  q21d x 3      10/03/2021 Imaging   PET 1. No residual hypermetabolism in the uterus suggesting an excellent response to treatment. No findings for metastatic disease. 2. Resolution of hypermetabolism in the left adrenal gland lesion suggesting this was metastatic disease. 3. Diffuse marrow hypermetabolism likely due to rebound from chemotherapy or marrow stimulating drugs   10/18/2021 - 11/08/2021 Chemotherapy   AUC 5 / Paclitaxel / Keytruda  Q21d  x 2 cycles   11/29/2021 -  Chemotherapy   Patient is on Treatment Plan : UTERINE Pembrolizumab  (200) q21d     04/30/2022 Imaging   CT chest abdomen pelvis w contrast  1. Similar to minimal decrease in size of a left adrenal nodule which was felt suspicious for metastatic disease on 10/03/2021 PET. 2. No evidence of new or progressive  disease. 3. Coronary artery atherosclerosis. Aortic Atherosclerosis (ICD10-I70.0). 4. Incidental findings, including: Hepatomegaly. Possible constipation.   08/28/2022 Imaging   CT chest abdomen pelvis w contrast showed 1. Similar appearance of indeterminate left adrenal nodule. 2. Otherwise, no evidence of metastatic disease. 3. Hepatomegaly 4.  Possible constipation. 5. Coronary artery atherosclerosis. Aortic Atherosclerosis (ICD10-I70.0).   12/22/2022 Imaging   CT chest abdomen pelvis w contrast showed 1. Stable indeterminate left adrenal nodule. 2. Otherwise, no noncontrast enhanced CT evidence of metastatic disease. 3. Colonic diverticulosis without findings of acute diverticulitis. 4.  Aortic Atherosclerosis (ICD10-I70.0).     04/17/2023 Imaging   CT chest abdomen pelvis wo contrast  1. Similar size of the indeterminate left adrenal nodule which is unchanged back to 06/10/2021. Felt unlikely to represent metastatic disease. Recommend attention on follow-up. 2. Otherwise, no evidence of metastatic disease. 3. Aortic valvular calcifications. Consider echocardiography to evaluate for valvular dysfunction. 4.  Aortic Atherosclerosis (ICD10-I70.0).       INTERVAL HISTORY Valerie Case is a 73 y.o. female who has above history reviewed by me today presents for follow up visit for anemia and endometrial cancer Overall she tolerates treatment Today Patient denies SOB,  nausea vomiting diarrhea. Boil on her upper back, she finishes antibiotics.  Tenderness has resolved.  Review of Systems  Constitutional:  Positive for fatigue. Negative for chills and fever.  HENT:   Negative for hearing loss and voice change.   Eyes:  Negative for eye problems.  Respiratory:  Negative for chest tightness and cough.   Cardiovascular:  Negative for chest pain.  Gastrointestinal:  Negative for abdominal distention, abdominal pain and blood in stool.  Endocrine: Negative for hot flashes.   Genitourinary:  Negative for difficulty urinating, frequency and vaginal bleeding.   Musculoskeletal:  Positive for arthralgias.  Skin:  Negative for itching and rash.       Boil   Neurological:  Negative for extremity weakness and headaches.  Hematological:  Negative for adenopathy.  Psychiatric/Behavioral:  Negative for confusion.     MEDICAL HISTORY:  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   Mixed hyperlipidemia    Peripheral polyneuropathy    Sleep apnea     SURGICAL HISTORY: Past Surgical History:  Procedure Laterality Date   APPENDECTOMY     BIOPSY  03/17/2023   Procedure: BIOPSY;  Surgeon: Quintin Buckle, DO;  Location: [facility] ENDOSCOPY;  Service: Gastroenterology;;   CESAREAN SECTION  01/23/1979   COLONOSCOPY     COLONOSCOPY N/A 03/17/2023   Procedure: COLONOSCOPY;  Surgeon: Quintin Buckle, DO;  Location: [facility] ENDOSCOPY;  Service: Gastroenterology;  Laterality: N/A;   COLONOSCOPY WITH PROPOFOL  N/A 03/13/2023   Procedure: COLONOSCOPY WITH PROPOFOL ;  Surgeon: Quintin Buckle, DO;  Location: [facility] ENDOSCOPY;  Service: Gastroenterology;  Laterality: N/A;   ESOPHAGOGASTRODUODENOSCOPY     ESOPHAGOGASTRODUODENOSCOPY N/A 03/17/2023   Procedure: ESOPHAGOGASTRODUODENOSCOPY (EGD);  Surgeon: Quintin Buckle, DO;  Location: [facility] ENDOSCOPY;  Service: Gastroenterology;  Laterality: N/A;   ESOPHAGOGASTRODUODENOSCOPY (EGD) WITH PROPOFOL  N/A 03/13/2023   Procedure: ESOPHAGOGASTRODUODENOSCOPY (EGD) WITH PROPOFOL ;   Surgeon: Quintin Buckle, DO;  Location: [facility] ENDOSCOPY;  Service: Gastroenterology;  Laterality: N/A;   IR IMAGING GUIDED PORT INSERTION  08/09/2021   TONSILLECTOMY  03/17/1955    SOCIAL HISTORY: Social History   Socioeconomic History   Marital status: Divorced    Spouse name: Not on file   Number of children: Not on file   Years of education: Not on file   Highest education level: Not on file  Occupational History   Not on file  Tobacco Use   Smoking status: Never   Smokeless tobacco: Never  Vaping Use   Vaping status: Never Used  Substance and Sexual Activity   Alcohol use: Not Currently   Drug use: Never   Sexual activity: Not Currently  Other Topics Concern   Not on file  Social History Narrative      Social Drivers of Health   Financial Resource Strain: Low Risk  (05/19/2023)   Received from [facility]   Overall Financial Resource Strain (CARDIA)    Difficulty of Paying Living Expenses: Not hard at all  Food Insecurity: No Food Insecurity (05/19/2023)   Received from [facility]   Hunger Vital Sign    Within the past 12 months, you worried that your food would run out before you got the money to buy more.: Never true  Within the past 12 months, the food you bought just didn't last and you didn't have money to get more.: Never true  Transportation Needs: No Transportation Needs (05/19/2023)   Received from [facility] - Transportation    In the past 12 months, has lack of transportation kept you from medical appointments or from getting medications?: No    Lack of Transportation (Non-Medical): No  Physical Activity: Inactive (09/27/2021)   Exercise Vital Sign    Days of Exercise per Week: 0 days    Minutes of Exercise per Session: 0 min  Stress: Stress Concern Present (09/27/2021)   [employer] of Occupational Health - Occupational Stress Questionnaire    Feeling of Stress : Rather much   Social Connections: Moderately Integrated (09/27/2021)   Social Connection and Isolation Panel    Frequency of Communication with Friends and Family: Three times a week    Frequency of Social Gatherings with Friends and Family: Three times a week    Attends Religious Services: 1 to 4 times per year    Active Member of Clubs or Organizations: No    Attends Banker Meetings: 1 to 4 times per year    Marital Status: Divorced  Catering manager Violence: Not At Risk (09/27/2021)   Humiliation, Afraid, Rape, and Kick questionnaire    Fear of Current or Ex-Partner: No    Emotionally Abused: No    Physically Abused: No    Sexually Abused: No    FAMILY HISTORY: Family History  Problem Relation Age of Onset   Breast cancer Mother 34   Cancer Mother    Cancer Father    Lung cancer Father    Breast cancer Cousin        dx 23s maternal    ALLERGIES:  is allergic to doxycycline , omeprazole, and aspirin.  MEDICATIONS:  Current Outpatient Medications  Medication Sig Dispense Refill   acetaminophen  (TYLENOL ) 650 MG CR tablet Take 1,300 mg by mouth every 8 (eight) hours as needed for pain.     albuterol  (VENTOLIN  HFA) 108 (90 Base) MCG/ACT inhaler Inhale 2 puffs into the lungs every 6 (six) hours as needed for wheezing or shortness of breath.     azelastine  (ASTELIN ) 0.1 % nasal spray Place 2 sprays into both nostrils daily. Use in each nostril as directed 30 mL 0   Cranberry-Vitamin C-Probiotic (AZO CRANBERRY PO) Take by mouth.     docusate sodium  (COLACE) 100 MG capsule Take 1 capsule (100 mg total) by mouth 2 (two) times daily. 60 capsule 1   empagliflozin (JARDIANCE) 25 MG TABS tablet Take 25 mg by mouth daily.     ferrous sulfate 325 (65 FE) MG EC tablet Take 325 mg by mouth daily with breakfast.     FLUoxetine  (PROZAC ) 10 MG capsule Take 10 mg by mouth daily.     fluticasone  (FLONASE ) 50 MCG/ACT nasal spray Place 1 spray into both nostrils daily as needed for allergies or  rhinitis.     furosemide (LASIX) 20 MG tablet Take 20 mg by mouth daily.     gabapentin  (NEURONTIN ) 100 MG capsule Take 1 capsule (100 mg total) by mouth daily. 90 capsule 1   hydrochlorothiazide  (HYDRODIURIL ) 12.5 MG tablet Take 12.5 mg by mouth daily.     hydrocortisone  2.5 % cream Apply topically 3 (three) times daily. 30 g 2   Insulin  Asp Prot & Asp FlexPen (NOVOLOG  70/30 MIX) (70-30) 100 UNIT/ML FlexPen Inject into the skin.  lidocaine -prilocaine  (EMLA ) cream Apply small amount to port and cover with saran wrap 1-2 hours prior to port access -daily PRN 30 g 3   lisinopril  (ZESTRIL ) 40 MG tablet Take 40 mg by mouth at bedtime.     loratadine (CLARITIN) 10 MG tablet Take by mouth.     LORazepam  (ATIVAN ) 0.5 MG tablet Take 1 tablet (0.5 mg total) by mouth every 8 (eight) hours as needed for anxiety (nausea vomiting). 30 tablet 0   metFORMIN  (GLUCOPHAGE -XR) 500 MG 24 hr tablet Take 500 mg by mouth at bedtime.     metoprolol  succinate (TOPROL -XL) 25 MG 24 hr tablet Take 25 mg by mouth daily.     mupirocin  ointment (BACTROBAN ) 2 % Apply 1 Application topically 3 (three) times daily. 22 g 0   NOVOLIN 70/30 FLEXPEN (70-30) 100 UNIT/ML KwikPen 50-110 Units See admin instructions. 50 units in the morning, 110 units at bedtime     OZEMPIC , 1 MG/DOSE, 4 MG/3ML SOPN Inject 1 mg into the skin every Tuesday.     rosuvastatin  (CRESTOR ) 10 MG tablet Take 10 mg by mouth daily.     traMADol  (ULTRAM ) 50 MG tablet Take 2 tablets (100 mg total) by mouth every 6 (six) hours as needed. 90 tablet 0   cephALEXin  (KEFLEX ) 500 MG capsule Take 1 capsule (500 mg total) by mouth 4 (four) times daily. (Patient not taking: Reported on 09/04/2023) 20 capsule 0   fluconazole  (DIFLUCAN ) 150 MG tablet Take 1 tablet (150 mg total) by mouth daily. Take 1 tablet day one and repeat 2 days later. (Patient not taking: Reported on 09/04/2023) 2 tablet 0   omeprazole (PRILOSEC) 20 MG capsule Take 20 mg by mouth daily. (Patient not  taking: Reported on 09/04/2023)     No current facility-administered medications for this visit.   Facility-Administered Medications Ordered in Other Visits  Medication Dose Route Frequency Provider Last Rate Last Admin   heparin  lock flush 100 UNIT/ML injection              PHYSICAL EXAMINATION: ECOG PERFORMANCE STATUS: 1 - Symptomatic but completely ambulatory  Physical Exam Constitutional:      General: She is not in acute distress.    Appearance: She is obese.  HENT:     Head: Normocephalic and atraumatic.   Eyes:     General: No scleral icterus.   Cardiovascular:     Rate and Rhythm: Normal rate and regular rhythm.     Heart sounds: Murmur heard.  Pulmonary:     Effort: Pulmonary effort is normal. No respiratory distress.  Abdominal:     General: Bowel sounds are normal. There is no distension.     Palpations: Abdomen is soft.   Musculoskeletal:        General: No deformity. Normal range of motion.     Cervical back: Normal range of motion.     Right lower leg: Edema present.     Left lower leg: Edema present.     Comments:     Skin:    General: Skin is warm and dry.     Findings: No rash.     Comments: Chronic venous sufficiency skin changes Upper back 4cm  tender, red lump with surrounding erythema   Neurological:     Mental Status: She is alert and oriented to person, place, and time. Mental status is at baseline.     Coordination: Coordination abnormal.   Psychiatric:        Mood and Affect: Mood  normal.    LABORATORY DATA:  I have reviewed the data as listed    Latest Ref Rng & Units 09/04/2023    8:46 AM 08/14/2023    8:13 AM 07/24/2023    8:42 AM  CBC  WBC 4.0 - 10.5 K/uL 7.1  6.8  6.1   Hemoglobin 12.0 - 15.0 g/dL 16.1  09.6  04.5   Hematocrit 36.0 - 46.0 % 37.1  35.0  35.5   Platelets 150 - 400 K/uL 281  293  275       Latest Ref Rng & Units 09/04/2023    8:46 AM 08/14/2023    8:13 AM 07/24/2023    8:42 AM  CMP  Glucose 70 - 99 mg/dL 409   811  914   BUN 8 - 23 mg/dL 39  36  31   Creatinine 0.44 - 1.00 mg/dL 7.82  9.56  2.13   Sodium 135 - 145 mmol/L 136  138  140   Potassium 3.5 - 5.1 mmol/L 4.8  4.0  4.4   Chloride 98 - 111 mmol/L 105  109  107   CO2 22 - 32 mmol/L 18  20  22    Calcium  8.9 - 10.3 mg/dL 08.6  9.7  9.9   Total Protein 6.5 - 8.1 g/dL 7.6  7.4  7.1   Total Bilirubin 0.0 - 1.2 mg/dL 0.5  0.3  0.5   Alkaline Phos 38 - 126 U/L 55  59  57   AST 15 - 41 U/L 24  20  19    ALT 0 - 44 U/L 18  19  18        RADIOGRAPHIC STUDIES: I have personally reviewed the radiological images as listed and agreed with the findings in the report. CT CHEST ABDOMEN PELVIS WO CONTRAST Result Date: 08/09/2023 EXAMINATION: CT CHEST ABDOMEN PELVIS WO CONTRAST CLINICAL INDICATION: Female, 73 years old. hx of endometrial cancer TECHNIQUE: Helical CT scan examination of the chest, abdomen, and pelvis is performed from the domes of the diaphragm to the pubic symphysis. Unless otherwise specified, incidental thyroid , adrenal, renal lesions do not require dedicated imaging follow up. Additionally, any mentioned pulmonary nodules do not require dedicated imaging follow-up based on the Fleischner guidelines unless otherwise specified. Coronary calcifications are not identified unless otherwise specified. COMPARISON: 04/17/2023 FINDINGS: CHEST: Right chest wall Mediport catheter tip terminates in the right atrium. The thyroid  is normal. The thoracic aorta is normal in caliber. The main pulmonary artery is normal in caliber. The heart is normal in size. There are coronary calcifications with calcifications of the aortic valve. There is no free fluid or pathologic adenopathy by size criteria. The trachea and mainstem bronchi are patent. Subsegmental atelectatic changes are noted within the lung bases. The lungs are otherwise clear. ABDOMEN/PELVIS: The liver contains a subcentimeter probable cyst. The gallbladder is normal. The spleen is normal. The pancreas is  normal. The right adrenal is normal. Stable left adrenal nodule compatible with a benign nodule most likely indicative of a lipid poor adenoma. The kidneys are normal. The abdominal aorta is normal in caliber. Scattered calcified atherosclerotic changes are present. The bladder is normal. The uterus is normal. There is colonic diverticulosis. Large and small bowel loops are otherwise within normal limits. No free fluid or lymphadenopathy. BONES: There are degenerative changes of the spine and bony pelvis. No concerning osseous lesions. IMPRESSION: No evidence for metastatic disease within the chest, abdomen, or pelvis. DOSE REDUCTION: All CT scans are performed using radiation  dose reduction techniques, when applicable. Technical factors are evaluated and adjusted to ensure appropriate moderation of exposure. Electronically signed by: [place] Engel MD 08/09/2023 09:50 AM EDT RP Workstation: [id number]

## 2023-09-07 ENCOUNTER — Other Ambulatory Visit: Payer: Self-pay

## 2023-09-11 ENCOUNTER — Inpatient Hospital Stay

## 2023-09-11 ENCOUNTER — Encounter: Payer: Self-pay | Admitting: Oncology

## 2023-09-11 ENCOUNTER — Inpatient Hospital Stay: Admitting: Oncology

## 2023-09-11 VITALS — BP 111/69 | HR 102 | Temp 97.9°F | Resp 18 | Wt 279.2 lb

## 2023-09-11 DIAGNOSIS — C541 Malignant neoplasm of endometrium: Secondary | ICD-10-CM

## 2023-09-11 DIAGNOSIS — N189 Chronic kidney disease, unspecified: Secondary | ICD-10-CM | POA: Diagnosis not present

## 2023-09-11 DIAGNOSIS — N1831 Chronic kidney disease, stage 3a: Secondary | ICD-10-CM

## 2023-09-11 DIAGNOSIS — D631 Anemia in chronic kidney disease: Secondary | ICD-10-CM

## 2023-09-11 LAB — CBC WITH DIFFERENTIAL/PLATELET
Abs Immature Granulocytes: 0.04 10*3/uL (ref 0.00–0.07)
Basophils Absolute: 0 10*3/uL (ref 0.0–0.1)
Basophils Relative: 1 %
Eosinophils Absolute: 0.2 10*3/uL (ref 0.0–0.5)
Eosinophils Relative: 3 %
HCT: 36.1 % (ref 36.0–46.0)
Hemoglobin: 11.6 g/dL — ABNORMAL LOW (ref 12.0–15.0)
Immature Granulocytes: 1 %
Lymphocytes Relative: 18 %
Lymphs Abs: 1.3 10*3/uL (ref 0.7–4.0)
MCH: 30.6 pg (ref 26.0–34.0)
MCHC: 32.1 g/dL (ref 30.0–36.0)
MCV: 95.3 fL (ref 80.0–100.0)
Monocytes Absolute: 0.6 10*3/uL (ref 0.1–1.0)
Monocytes Relative: 9 %
Neutro Abs: 5 10*3/uL (ref 1.7–7.7)
Neutrophils Relative %: 68 %
Platelets: 282 10*3/uL (ref 150–400)
RBC: 3.79 MIL/uL — ABNORMAL LOW (ref 3.87–5.11)
RDW: 13.3 % (ref 11.5–15.5)
WBC: 7.2 10*3/uL (ref 4.0–10.5)
nRBC: 0 % (ref 0.0–0.2)

## 2023-09-11 LAB — COMPREHENSIVE METABOLIC PANEL WITH GFR
ALT: 18 U/L (ref 0–44)
AST: 22 U/L (ref 15–41)
Albumin: 3.9 g/dL (ref 3.5–5.0)
Alkaline Phosphatase: 50 U/L (ref 38–126)
Anion gap: 10 (ref 5–15)
BUN: 49 mg/dL — ABNORMAL HIGH (ref 8–23)
CO2: 21 mmol/L — ABNORMAL LOW (ref 22–32)
Calcium: 10 mg/dL (ref 8.9–10.3)
Chloride: 105 mmol/L (ref 98–111)
Creatinine, Ser: 1.39 mg/dL — ABNORMAL HIGH (ref 0.44–1.00)
GFR, Estimated: 40 mL/min — ABNORMAL LOW (ref >60)
Glucose, Bld: 151 mg/dL — ABNORMAL HIGH (ref 70–99)
Potassium: 4.6 mmol/L (ref 3.5–5.1)
Sodium: 136 mmol/L (ref 135–145)
Total Bilirubin: 0.6 mg/dL (ref 0.0–1.2)
Total Protein: 7.3 g/dL (ref 6.5–8.1)

## 2023-09-11 MED ORDER — SODIUM CHLORIDE 0.9 % IV SOLN
Freq: Once | INTRAVENOUS | Status: AC
  Filled 2023-09-11: qty 250

## 2023-09-11 MED ORDER — HEPARIN SOD (PORK) LOCK FLUSH 100 UNIT/ML IV SOLN
500.0000 [IU] | Freq: Once | INTRAVENOUS | Status: AC
  Administered 2023-09-11: 500 [IU] via INTRAVENOUS
  Filled 2023-09-11: qty 5

## 2023-09-11 NOTE — Progress Notes (Addendum)
 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 s/p carboplatin  and Taxol  for 5 cycles, on Keytruda  maintenance.  Labs are reviewed and discussed with patient.  hold Keytruda  due to acute kidney failure. CT showed NED/stable disease,stable adrenal nodule  CKD (chronic kidney disease) stage 3, GFR 30-59 ml/min (HCC) Encourage oral hydration and avoid nephrotoxins.  Acute increase of creatinine, likely due to dehydration.  Patient will get IV fluid 1L of NS today.  Recommend patient to hold off Lasix.  Encouraged her to discuss with primary care provider regarding her kidney function and Lasix use.  Anemia in chronic kidney disease (CKD) Stable hemoglobin    Orders Placed This Encounter  Procedures   CBC with Differential    Standing Status:   Future    Expected Date:   10/05/2023    Expiration Date:   10/04/2024   Comprehensive metabolic panel    Standing Status:   Future    Expected Date:   10/05/2023    Expiration Date:   10/04/2024    Return of visit:  3 weeks lab MD Keytruda   Call Babara, MD, [name] [facility] Hematology Oncology 09/11/2023     HISTORY OF PRESENTING ILLNESS:   Valerie Case is a  73 y.o.  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 Oncology History  Endometrial adenocarcinoma (HCC)  12/12/2020 Imaging   12/12/2020, CT abdomen pelvis with contrast showed no radiographic evidence of urinary tract neoplasm, calculi,  hydronephrosis.  2.6 cm nonspecific left adrenal mass. 02/12/2021 CT hematuria work-up showed small uterine fibroids, no findings to explain hematuria.Hepatomegaly, diverticulosis without evidence of diverticulitis, Coronary artery disease   04/01/2021 -  Hospital Admission   04/01/2021 - 04/03/2021, patient was hospitalized due to symptomatic anemia, hemoglobin 6.9, heavy postmenopausal bleeding with passing large clots.  She also presented with increased creatinine level to 2.58 compared to her baseline level of 0.9 in November 2022.  Patient received IV iron  infusion and 2 units of PRBC during the hospital stay..  04/02/2021, iron  panel showed iron  saturation 37, ferritin 37, TIBC 333-the studies were done after patient received blood transfusion.  At discharge, hemoglobin was 7.7.   04/01/2021 Initial Diagnosis   04/01/2020, endometrial biopsy showed poorly differentiated endometrial adenocarcinoma. Omniseq NGS showed TMB 48.4 mt/mb [high], MSI High, PD-L1-TPS <1%, PIK3CA H1047Q, Negative for BRAF, HER2, NTRK1 RET.     04/10/2021 Cancer Staging   Staging form: Corpus Uteri - Carcinoma and Carcinosarcoma, AJCC 8th Edition - Clinical stage from 04/10/2021: FIGO Stage III, calculated as Stage Unknown (cT3, cNX, cM0) - Signed by Babara Call, MD on 08/24/2021 Stage prefix: Initial diagnosis   04/15/2021 Imaging   PET showed 1. Large hypermetabolic endometrial mass consistent with known endometrial cancer. Suspect direct invasion/involvement of the right adnexa. Right pelvic sidewall hypermetabolic adenopathy.2. 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 3. No findings for metastatic disease involving the chest or bonystructures.     04/24/2021 - 05/29/2021 Radiation Therapy   status  post radiation to pelvis   08/05/2021 Imaging   MRI abdomen w wo contrast 1. Stable solid enhancing 2.7 cm left adrenal lesion, which  was hypermetabolic on prior PET/CTs but is unchanged in size dating back to December 12, 2020. 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 However, unfortunately metastatic disease can not be entirely excluded and remains a pertinent differential consideration. Comparison with more remote prior imaging would be the most valuable tool in the assessment of this lesion, as demonstrating long-term stability would indicate this to be a benign lesion. However, if no prior imaging can be made available, would consider follow-up adrenal protocol CT with and without intravenous contrast material in 3 months as this would allow for further assessment of stability as well as enhancement and washout characteristics of the lesion potentially allowing for better characterization versus direct tissue sampling.2. Hepatomegaly and hepatic steatosis.3. Colonic diverticulosis without findings of acute diverticulitis    08/16/2021 - 09/27/2021 Chemotherapy   UTERINE Carboplatin  AUC 5 / Paclitaxel  q21d x 3      10/03/2021 Imaging   PET 1. No residual hypermetabolism in the uterus suggesting an excellent response to treatment. No findings for metastatic disease. 2. Resolution of hypermetabolism in the left adrenal gland lesion suggesting this was metastatic disease. 3. Diffuse marrow hypermetabolism likely due to rebound from chemotherapy or marrow stimulating drugs   10/18/2021 - 11/08/2021 Chemotherapy   AUC 5 / Paclitaxel / Keytruda  Q21d  x 2 cycles   11/29/2021 -  Chemotherapy   Patient is on Treatment Plan : UTERINE Pembrolizumab  (200) q21d     04/30/2022 Imaging   CT chest abdomen pelvis w contrast  1. Similar to minimal decrease in size of a left adrenal nodule which was felt suspicious for metastatic disease on 10/03/2021 PET. 2. No evidence of new  or progressive disease. 3. Coronary artery atherosclerosis. Aortic Atherosclerosis (ICD10-I70.0). 4. Incidental findings, including: Hepatomegaly. Possible constipation.   08/28/2022 Imaging   CT chest abdomen pelvis w contrast showed 1. Similar appearance of indeterminate left adrenal nodule. 2. Otherwise, no evidence of metastatic disease. 3. Hepatomegaly 4.  Possible constipation. 5. Coronary artery atherosclerosis. Aortic Atherosclerosis (ICD10-I70.0).   12/22/2022 Imaging   CT chest abdomen pelvis w contrast showed 1. Stable indeterminate left adrenal nodule. 2. Otherwise, no noncontrast enhanced CT evidence of metastatic disease. 3. Colonic diverticulosis without findings of acute diverticulitis. 4.  Aortic Atherosclerosis (ICD10-I70.0).     04/17/2023 Imaging   CT chest abdomen pelvis wo contrast  1. Similar size of the indeterminate left adrenal nodule which is unchanged back to 06/10/2021. Felt unlikely to represent metastatic disease. Recommend attention on follow-up. 2. Otherwise, no evidence of metastatic disease. 3. Aortic valvular calcifications. Consider echocardiography to evaluate for valvular dysfunction. 4.  Aortic Atherosclerosis (ICD10-I70.0).       INTERVAL HISTORY Valerie Case is a 73 y.o. female who has above history reviewed by me today presents for follow up visit for anemia and endometrial cancer Overall she tolerates treatment Today Patient denies SOB,  nausea vomiting diarrhea. Boil on her upper back, she finishes antibiotics.  Tenderness has resolved.  Review of Systems  Constitutional:  Positive for fatigue. Negative for chills and fever.  HENT:   Negative for hearing loss and voice change.   Eyes:  Negative for eye problems.  Respiratory:  Negative for chest tightness and cough.  Cardiovascular:  Negative for chest pain.  Gastrointestinal:  Negative for abdominal distention, abdominal pain and blood in stool.  Endocrine: Negative for  hot flashes.  Genitourinary:  Negative for difficulty urinating, frequency and vaginal bleeding.   Musculoskeletal:  Positive for arthralgias.  Skin:  Negative for itching and rash.  Neurological:  Negative for extremity weakness and headaches.  Hematological:  Negative for adenopathy.  Psychiatric/Behavioral:  Negative for confusion.     MEDICAL HISTORY:  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   Mixed hyperlipidemia    Peripheral polyneuropathy    Sleep apnea     SURGICAL HISTORY: Past Surgical History:  Procedure Laterality Date   APPENDECTOMY     BIOPSY  03/17/2023   Procedure: BIOPSY;  Surgeon: Onita Elspeth Sharper, DO;  Location: [facility] ENDOSCOPY;  Service: Gastroenterology;;   CESAREAN SECTION  01/23/1979   COLONOSCOPY     COLONOSCOPY N/A 03/17/2023   Procedure: COLONOSCOPY;  Surgeon: Onita Elspeth Sharper, DO;  Location: [facility] ENDOSCOPY;  Service: Gastroenterology;  Laterality: N/A;   COLONOSCOPY WITH PROPOFOL  N/A 03/13/2023   Procedure: COLONOSCOPY WITH PROPOFOL ;  Surgeon: Onita Elspeth Sharper, DO;  Location: [facility] ENDOSCOPY;  Service: Gastroenterology;  Laterality: N/A;   ESOPHAGOGASTRODUODENOSCOPY     ESOPHAGOGASTRODUODENOSCOPY N/A 03/17/2023   Procedure: ESOPHAGOGASTRODUODENOSCOPY (EGD);  Surgeon: Onita Elspeth Sharper, DO;  Location: [facility] ENDOSCOPY;  Service: Gastroenterology;  Laterality: N/A;   ESOPHAGOGASTRODUODENOSCOPY (EGD) WITH PROPOFOL  N/A 03/13/2023   Procedure: ESOPHAGOGASTRODUODENOSCOPY (EGD) WITH PROPOFOL ;   Surgeon: Onita Elspeth Sharper, DO;  Location: [facility] ENDOSCOPY;  Service: Gastroenterology;  Laterality: N/A;   IR IMAGING GUIDED PORT INSERTION  08/09/2021   TONSILLECTOMY  03/17/1955    SOCIAL HISTORY: Social History   Socioeconomic History   Marital status: Divorced    Spouse name: Not on file   Number of children: Not on file   Years of education: Not on file   Highest education level: Not on file  Occupational History   Not on file  Tobacco Use   Smoking status: Never   Smokeless tobacco: Never  Vaping Use   Vaping status: Never Used  Substance and Sexual Activity   Alcohol use: Not Currently   Drug use: Never   Sexual activity: Not Currently  Other Topics Concern   Not on file  Social History Narrative      Social Drivers of Health   Financial Resource Strain: Low Risk  (05/19/2023)   Received from [facility]   Overall Financial Resource Strain (CARDIA)    Difficulty of Paying Living Expenses: Not hard at all  Food Insecurity: No Food Insecurity (05/19/2023)   Received from [facility]   Hunger Vital Sign    Within the past 12 months, you worried that your food would run out before you got the money to buy more.: Never true    Within the past 12 months, the food you bought just didn't last and you  didn't have money to get more.: Never true  Transportation Needs: No Transportation Needs (05/19/2023)   Received from [facility] - Transportation    In the past 12 months, has lack of transportation kept you from medical appointments or from getting medications?: No    Lack of Transportation (Non-Medical): No  Physical Activity: Inactive (09/27/2021)   Exercise Vital Sign    Days of Exercise per Week: 0 days    Minutes of Exercise per Session: 0 min  Stress: Stress Concern Present (09/27/2021)   [employer] of Occupational Health - Occupational Stress Questionnaire    Feeling of Stress : Rather much   Social Connections: Moderately Integrated (09/27/2021)   Social Connection and Isolation Panel    Frequency of Communication with Friends and Family: Three times a week    Frequency of Social Gatherings with Friends and Family: Three times a week    Attends Religious Services: 1 to 4 times per year    Active Member of Clubs or Organizations: No    Attends Banker Meetings: 1 to 4 times per year    Marital Status: Divorced  Catering manager Violence: Not At Risk (09/27/2021)   Humiliation, Afraid, Rape, and Kick questionnaire    Fear of Current or Ex-Partner: No    Emotionally Abused: No    Physically Abused: No    Sexually Abused: No    FAMILY HISTORY: Family History  Problem Relation Age of Onset   Breast cancer Mother 33   Cancer Mother    Cancer Father    Lung cancer Father    Breast cancer Cousin        dx 84s maternal    ALLERGIES:  is allergic to doxycycline , omeprazole, and aspirin.  MEDICATIONS:  Current Outpatient Medications  Medication Sig Dispense Refill   acetaminophen  (TYLENOL ) 650 MG CR tablet Take 1,300 mg by mouth every 8 (eight) hours as needed for pain.     albuterol  (VENTOLIN  HFA) 108 (90 Base) MCG/ACT inhaler Inhale 2 puffs into the lungs every 6 (six) hours as needed for wheezing or shortness of breath.     azelastine  (ASTELIN ) 0.1 % nasal spray Place 2 sprays into both nostrils daily. Use in each nostril as directed 30 mL 0   Cranberry-Vitamin C-Probiotic (AZO CRANBERRY PO) Take by mouth.     docusate sodium  (COLACE) 100 MG capsule Take 1 capsule (100 mg total) by mouth 2 (two) times daily. 60 capsule 1   empagliflozin (JARDIANCE) 25 MG TABS tablet Take 25 mg by mouth daily.     ferrous sulfate 325 (65 FE) MG EC tablet Take 325 mg by mouth daily with breakfast.     FLUoxetine  (PROZAC ) 10 MG capsule Take 10 mg by mouth daily.     fluticasone  (FLONASE ) 50 MCG/ACT nasal spray Place 1 spray into both nostrils daily as needed for allergies or  rhinitis.     furosemide (LASIX) 20 MG tablet Take 20 mg by mouth daily.     gabapentin  (NEURONTIN ) 100 MG capsule Take 1 capsule (100 mg total) by mouth daily. 90 capsule 1   hydrochlorothiazide  (HYDRODIURIL ) 12.5 MG tablet Take 12.5 mg by mouth daily.     hydrocortisone  2.5 % cream Apply topically 3 (three) times daily. 30 g 2   Insulin  Asp Prot & Asp FlexPen (NOVOLOG  70/30 MIX) (70-30) 100 UNIT/ML FlexPen Inject into the skin.     lidocaine -prilocaine  (EMLA ) cream Apply small amount to port and cover with  saran wrap 1-2 hours prior to port access -daily PRN 30 g 3   lisinopril  (ZESTRIL ) 40 MG tablet Take 40 mg by mouth at bedtime.     loratadine (CLARITIN) 10 MG tablet Take by mouth.     LORazepam  (ATIVAN ) 0.5 MG tablet Take 1 tablet (0.5 mg total) by mouth every 8 (eight) hours as needed for anxiety (nausea vomiting). 30 tablet 0   metFORMIN  (GLUCOPHAGE -XR) 500 MG 24 hr tablet Take 500 mg by mouth at bedtime.     metoprolol  succinate (TOPROL -XL) 25 MG 24 hr tablet Take 25 mg by mouth daily.     mupirocin  ointment (BACTROBAN ) 2 % Apply 1 Application topically 3 (three) times daily. 22 g 0   NOVOLIN 70/30 FLEXPEN (70-30) 100 UNIT/ML KwikPen 50-110 Units See admin instructions. 50 units in the morning, 110 units at bedtime     OZEMPIC , 1 MG/DOSE, 4 MG/3ML SOPN Inject 1 mg into the skin every Tuesday.     rosuvastatin  (CRESTOR ) 10 MG tablet Take 10 mg by mouth daily.     traMADol  (ULTRAM ) 50 MG tablet Take 2 tablets (100 mg total) by mouth every 6 (six) hours as needed. 90 tablet 0   omeprazole (PRILOSEC) 20 MG capsule Take 20 mg by mouth daily. (Patient not taking: Reported on 09/11/2023)     No current facility-administered medications for this visit.   Facility-Administered Medications Ordered in Other Visits  Medication Dose Route Frequency Provider Last Rate Last Admin   heparin  lock flush 100 UNIT/ML injection              PHYSICAL EXAMINATION: ECOG PERFORMANCE STATUS: 1 -  Symptomatic but completely ambulatory  Physical Exam Constitutional:      General: She is not in acute distress.    Appearance: She is obese.  HENT:     Head: Normocephalic and atraumatic.   Eyes:     General: No scleral icterus.   Cardiovascular:     Rate and Rhythm: Normal rate and regular rhythm.     Heart sounds: Murmur heard.  Pulmonary:     Effort: Pulmonary effort is normal. No respiratory distress.  Abdominal:     General: Bowel sounds are normal. There is no distension.     Palpations: Abdomen is soft.   Musculoskeletal:        General: No deformity. Normal range of motion.     Cervical back: Normal range of motion.     Right lower leg: Edema present.     Left lower leg: Edema present.     Comments:     Skin:    General: Skin is warm and dry.     Findings: No rash.     Comments: Chronic venous sufficiency skin changes    Neurological:     Mental Status: She is alert and oriented to person, place, and time. Mental status is at baseline.   Psychiatric:        Mood and Affect: Mood normal.    LABORATORY DATA:  I have reviewed the data as listed    Latest Ref Rng & Units 09/11/2023   10:30 AM 09/04/2023    8:46 AM 08/14/2023    8:13 AM  CBC  WBC 4.0 - 10.5 K/uL 7.2  7.1  6.8   Hemoglobin 12.0 - 15.0 g/dL 88.3  87.9  88.5   Hematocrit 36.0 - 46.0 % 36.1  37.1  35.0   Platelets 150 - 400 K/uL 282  281  293  Latest Ref Rng & Units 09/11/2023   10:30 AM 09/04/2023    8:46 AM 08/14/2023    8:13 AM  CMP  Glucose 70 - 99 mg/dL 848  873  865   BUN 8 - 23 mg/dL 49  39  36   Creatinine 0.44 - 1.00 mg/dL 8.60  8.74  8.99   Sodium 135 - 145 mmol/L 136  136  138   Potassium 3.5 - 5.1 mmol/L 4.6  4.8  4.0   Chloride 98 - 111 mmol/L 105  105  109   CO2 22 - 32 mmol/L 21  18  20    Calcium  8.9 - 10.3 mg/dL 89.9  89.2  9.7   Total Protein 6.5 - 8.1 g/dL 7.3  7.6  7.4   Total Bilirubin 0.0 - 1.2 mg/dL 0.6  0.5  0.3   Alkaline Phos 38 - 126 U/L 50  55  59    AST 15 - 41 U/L 22  24  20    ALT 0 - 44 U/L 18  18  19        RADIOGRAPHIC STUDIES: I have personally reviewed the radiological images as listed and agreed with the findings in the report. CT CHEST ABDOMEN PELVIS WO CONTRAST Result Date: 08/09/2023 EXAMINATION: CT CHEST ABDOMEN PELVIS WO CONTRAST CLINICAL INDICATION: Female, 73 years old. hx of endometrial cancer TECHNIQUE: Helical CT scan examination of the chest, abdomen, and pelvis is performed from the domes of the diaphragm to the pubic symphysis. Unless otherwise specified, incidental thyroid , adrenal, renal lesions do not require dedicated imaging follow up. Additionally, any mentioned pulmonary nodules do not require dedicated imaging follow-up based on the Fleischner guidelines unless otherwise specified. Coronary calcifications are not identified unless otherwise specified. COMPARISON: 04/17/2023 FINDINGS: CHEST: Right chest wall Mediport catheter tip terminates in the right atrium. The thyroid  is normal. The thoracic aorta is normal in caliber. The main pulmonary artery is normal in caliber. The heart is normal in size. There are coronary calcifications with calcifications of the aortic valve. There is no free fluid or pathologic adenopathy by size criteria. The trachea and mainstem bronchi are patent. Subsegmental atelectatic changes are noted within the lung bases. The lungs are otherwise clear. ABDOMEN/PELVIS: The liver contains a subcentimeter probable cyst. The gallbladder is normal. The spleen is normal. The pancreas is normal. The right adrenal is normal. Stable left adrenal nodule compatible with a benign nodule most likely indicative of a lipid poor adenoma. The kidneys are normal. The abdominal aorta is normal in caliber. Scattered calcified atherosclerotic changes are present. The bladder is normal. The uterus is normal. There is colonic diverticulosis. Large and small bowel loops are otherwise within normal limits. No free fluid or  lymphadenopathy. BONES: There are degenerative changes of the spine and bony pelvis. No concerning osseous lesions. IMPRESSION: No evidence for metastatic disease within the chest, abdomen, or pelvis. DOSE REDUCTION: All CT scans are performed using radiation dose reduction techniques, when applicable. Technical factors are evaluated and adjusted to ensure appropriate moderation of exposure. Electronically signed by: [place] Engel MD 08/09/2023 09:50 AM EDT RP Workstation: [id number]

## 2023-09-11 NOTE — Assessment & Plan Note (Signed)
 Encourage oral hydration and avoid nephrotoxins.  Acute increase of creatinine, likely due to dehydration.  Patient will get IV fluid 1L of NS today.  Recommend patient to hold off Lasix.  Encouraged her to discuss with primary care provider regarding her kidney function and Lasix use.

## 2023-09-11 NOTE — Assessment & Plan Note (Signed)
 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 s/p carboplatin  and Taxol  for 5 cycles, on Keytruda  maintenance.  Labs are reviewed and discussed with patient.  hold Keytruda  due to acute kidney failure. CT showed NED/stable disease,stable adrenal nodule

## 2023-09-11 NOTE — Assessment & Plan Note (Signed)
 Stable hemoglobin

## 2023-09-12 ENCOUNTER — Other Ambulatory Visit: Payer: Self-pay

## 2023-09-25 ENCOUNTER — Ambulatory Visit

## 2023-09-25 ENCOUNTER — Ambulatory Visit: Admitting: Oncology

## 2023-09-25 ENCOUNTER — Other Ambulatory Visit

## 2023-09-28 ENCOUNTER — Other Ambulatory Visit: Payer: Self-pay

## 2023-10-05 ENCOUNTER — Inpatient Hospital Stay: Attending: Obstetrics and Gynecology | Admitting: Oncology

## 2023-10-05 ENCOUNTER — Inpatient Hospital Stay: Attending: Obstetrics and Gynecology

## 2023-10-05 ENCOUNTER — Inpatient Hospital Stay

## 2023-10-05 ENCOUNTER — Encounter: Payer: Self-pay | Admitting: Oncology

## 2023-10-05 VITALS — BP 111/63 | HR 99 | Temp 98.6°F | Resp 19 | Wt 280.1 lb

## 2023-10-05 DIAGNOSIS — N1831 Chronic kidney disease, stage 3a: Secondary | ICD-10-CM | POA: Diagnosis not present

## 2023-10-05 DIAGNOSIS — N189 Chronic kidney disease, unspecified: Secondary | ICD-10-CM | POA: Diagnosis not present

## 2023-10-05 DIAGNOSIS — N183 Chronic kidney disease, stage 3 unspecified: Secondary | ICD-10-CM | POA: Diagnosis not present

## 2023-10-05 DIAGNOSIS — Z9049 Acquired absence of other specified parts of digestive tract: Secondary | ICD-10-CM | POA: Diagnosis not present

## 2023-10-05 DIAGNOSIS — Z5112 Encounter for antineoplastic immunotherapy: Secondary | ICD-10-CM | POA: Diagnosis present

## 2023-10-05 DIAGNOSIS — D631 Anemia in chronic kidney disease: Secondary | ICD-10-CM

## 2023-10-05 DIAGNOSIS — I700 Atherosclerosis of aorta: Secondary | ICD-10-CM | POA: Insufficient documentation

## 2023-10-05 DIAGNOSIS — Z809 Family history of malignant neoplasm, unspecified: Secondary | ICD-10-CM | POA: Insufficient documentation

## 2023-10-05 DIAGNOSIS — Z79620 Long term (current) use of immunosuppressive biologic: Secondary | ICD-10-CM | POA: Diagnosis not present

## 2023-10-05 DIAGNOSIS — G629 Polyneuropathy, unspecified: Secondary | ICD-10-CM | POA: Diagnosis not present

## 2023-10-05 DIAGNOSIS — K760 Fatty (change of) liver, not elsewhere classified: Secondary | ICD-10-CM | POA: Diagnosis not present

## 2023-10-05 DIAGNOSIS — R160 Hepatomegaly, not elsewhere classified: Secondary | ICD-10-CM | POA: Insufficient documentation

## 2023-10-05 DIAGNOSIS — Z923 Personal history of irradiation: Secondary | ICD-10-CM | POA: Insufficient documentation

## 2023-10-05 DIAGNOSIS — Z881 Allergy status to other antibiotic agents status: Secondary | ICD-10-CM | POA: Diagnosis not present

## 2023-10-05 DIAGNOSIS — Z801 Family history of malignant neoplasm of trachea, bronchus and lung: Secondary | ICD-10-CM | POA: Insufficient documentation

## 2023-10-05 DIAGNOSIS — E860 Dehydration: Secondary | ICD-10-CM | POA: Insufficient documentation

## 2023-10-05 DIAGNOSIS — K5730 Diverticulosis of large intestine without perforation or abscess without bleeding: Secondary | ICD-10-CM | POA: Diagnosis not present

## 2023-10-05 DIAGNOSIS — Z79899 Other long term (current) drug therapy: Secondary | ICD-10-CM | POA: Diagnosis not present

## 2023-10-05 DIAGNOSIS — M2550 Pain in unspecified joint: Secondary | ICD-10-CM | POA: Insufficient documentation

## 2023-10-05 DIAGNOSIS — I2510 Atherosclerotic heart disease of native coronary artery without angina pectoris: Secondary | ICD-10-CM | POA: Insufficient documentation

## 2023-10-05 DIAGNOSIS — Z9089 Acquired absence of other organs: Secondary | ICD-10-CM | POA: Diagnosis not present

## 2023-10-05 DIAGNOSIS — C541 Malignant neoplasm of endometrium: Secondary | ICD-10-CM

## 2023-10-05 DIAGNOSIS — R5383 Other fatigue: Secondary | ICD-10-CM | POA: Diagnosis not present

## 2023-10-05 DIAGNOSIS — Z8719 Personal history of other diseases of the digestive system: Secondary | ICD-10-CM | POA: Insufficient documentation

## 2023-10-05 DIAGNOSIS — Z803 Family history of malignant neoplasm of breast: Secondary | ICD-10-CM | POA: Insufficient documentation

## 2023-10-05 DIAGNOSIS — R7989 Other specified abnormal findings of blood chemistry: Secondary | ICD-10-CM | POA: Diagnosis not present

## 2023-10-05 DIAGNOSIS — Z95828 Presence of other vascular implants and grafts: Secondary | ICD-10-CM

## 2023-10-05 DIAGNOSIS — Z86018 Personal history of other benign neoplasm: Secondary | ICD-10-CM | POA: Insufficient documentation

## 2023-10-05 DIAGNOSIS — Z886 Allergy status to analgesic agent status: Secondary | ICD-10-CM | POA: Insufficient documentation

## 2023-10-05 DIAGNOSIS — I129 Hypertensive chronic kidney disease with stage 1 through stage 4 chronic kidney disease, or unspecified chronic kidney disease: Secondary | ICD-10-CM | POA: Diagnosis not present

## 2023-10-05 LAB — COMPREHENSIVE METABOLIC PANEL WITH GFR
ALT: 16 U/L (ref 0–44)
AST: 21 U/L (ref 15–41)
Albumin: 4.1 g/dL (ref 3.5–5.0)
Alkaline Phosphatase: 48 U/L (ref 38–126)
Anion gap: 11 (ref 5–15)
BUN: 34 mg/dL — ABNORMAL HIGH (ref 8–23)
CO2: 20 mmol/L — ABNORMAL LOW (ref 22–32)
Calcium: 9.9 mg/dL (ref 8.9–10.3)
Chloride: 107 mmol/L (ref 98–111)
Creatinine, Ser: 1.27 mg/dL — ABNORMAL HIGH (ref 0.44–1.00)
GFR, Estimated: 45 mL/min — ABNORMAL LOW (ref >60)
Glucose, Bld: 169 mg/dL — ABNORMAL HIGH (ref 70–99)
Potassium: 4.3 mmol/L (ref 3.5–5.1)
Sodium: 138 mmol/L (ref 135–145)
Total Bilirubin: 0.4 mg/dL (ref 0.0–1.2)
Total Protein: 7.3 g/dL (ref 6.5–8.1)

## 2023-10-05 LAB — CBC WITH DIFFERENTIAL/PLATELET
Abs Immature Granulocytes: 0.06 K/uL (ref 0.00–0.07)
Basophils Absolute: 0 K/uL (ref 0.0–0.1)
Basophils Relative: 0 %
Eosinophils Absolute: 0.2 K/uL (ref 0.0–0.5)
Eosinophils Relative: 3 %
HCT: 35.8 % — ABNORMAL LOW (ref 36.0–46.0)
Hemoglobin: 11.7 g/dL — ABNORMAL LOW (ref 12.0–15.0)
Immature Granulocytes: 1 %
Lymphocytes Relative: 17 %
Lymphs Abs: 1.4 K/uL (ref 0.7–4.0)
MCH: 31 pg (ref 26.0–34.0)
MCHC: 32.7 g/dL (ref 30.0–36.0)
MCV: 95 fL (ref 80.0–100.0)
Monocytes Absolute: 0.7 K/uL (ref 0.1–1.0)
Monocytes Relative: 8 %
Neutro Abs: 5.6 K/uL (ref 1.7–7.7)
Neutrophils Relative %: 71 %
Platelets: 293 K/uL (ref 150–400)
RBC: 3.77 MIL/uL — ABNORMAL LOW (ref 3.87–5.11)
RDW: 13.2 % (ref 11.5–15.5)
WBC: 7.9 K/uL (ref 4.0–10.5)
nRBC: 0 % (ref 0.0–0.2)

## 2023-10-05 MED ORDER — HEPARIN SOD (PORK) LOCK FLUSH 100 UNIT/ML IV SOLN
500.0000 [IU] | Freq: Once | INTRAVENOUS | Status: DC | PRN
Start: 2023-10-05 — End: 2023-10-05
  Filled 2023-10-05: qty 5

## 2023-10-05 MED ORDER — SODIUM CHLORIDE 0.9 % IV SOLN
200.0000 mg | Freq: Once | INTRAVENOUS | Status: AC
  Administered 2023-10-05: 200 mg via INTRAVENOUS
  Filled 2023-10-05: qty 200

## 2023-10-05 MED ORDER — SODIUM CHLORIDE 0.9% FLUSH
10.0000 mL | Freq: Once | INTRAVENOUS | Status: AC
  Administered 2023-10-05: 10 mL via INTRAVENOUS
  Filled 2023-10-05: qty 10

## 2023-10-05 MED ORDER — SODIUM CHLORIDE 0.9 % IV SOLN
Freq: Once | INTRAVENOUS | Status: AC
Start: 2023-10-05 — End: 2023-10-05
  Filled 2023-10-05: qty 250

## 2023-10-05 NOTE — Assessment & Plan Note (Signed)
 Treatment plan as listed above.

## 2023-10-05 NOTE — Progress Notes (Addendum)
 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 s/p carboplatin  and Taxol  for 5 cycles, on Keytruda  maintenance.  Labs are reviewed and discussed with patient.  Proceed with Keytruda  . CT showed NED/stable disease,stable adrenal nodule  Anemia in chronic kidney disease (CKD) Stable hemoglobin  CKD (chronic kidney disease) stage 3, GFR 30-59 ml/min (HCC) Encourage oral hydration and avoid nephrotoxins.  Acute increase of creatinine, likely due to dehydration.  Patient will get IV fluid 1L of NS today.  Recommend patient to hold off Lasix.  Encouraged her to discuss with primary care provider regarding her kidney function and Lasix use.  Encounter for antineoplastic immunotherapy Treatment plan as listed above.   Neuropathy Continue  gabapentin  100mg  daily    Orders Placed This Encounter  Procedures   CBC with Differential    Standing Status:   Future    Expected Date:   11/16/2023    Expiration Date:   11/15/2024   Comprehensive metabolic panel    Standing Status:   Future    Expected Date:   11/16/2023    Expiration Date:   11/15/2024   TSH    Standing Status:   Future    Expected Date:   11/16/2023    Expiration Date:   11/15/2024    Return of visit:  3 weeks lab MD Keytruda   Call Babara, MD, [name] [facility] Hematology Oncology 10/05/2023     HISTORY OF PRESENTING ILLNESS:   Valerie Case is a  73 y.o.  female presents for follow up of  FIGO grade 3 poorly differentiated endometrial  adenocarcinoma. Oncology History  Endometrial adenocarcinoma (HCC)  12/12/2020 Imaging   12/12/2020, CT abdomen pelvis with contrast showed no radiographic evidence of urinary tract neoplasm, calculi, hydronephrosis.  2.6 cm nonspecific left adrenal mass. 02/12/2021 CT hematuria work-up showed small uterine fibroids, no findings to explain hematuria.Hepatomegaly, diverticulosis without evidence of diverticulitis, Coronary artery disease   04/01/2021 -  Hospital Admission   04/01/2021 - 04/03/2021, patient was hospitalized due to symptomatic anemia, hemoglobin 6.9, heavy postmenopausal bleeding with passing large clots.  She also presented with increased creatinine level to 2.58 compared to her baseline level of 0.9 in November 2022.  Patient received IV iron  infusion and 2 units of PRBC during the hospital stay..  04/02/2021, iron  panel showed iron  saturation 37, ferritin 37, TIBC 333-the studies were done after patient received blood transfusion.  At discharge, hemoglobin was 7.7.   04/01/2021 Initial Diagnosis   04/01/2020, endometrial biopsy showed poorly differentiated endometrial adenocarcinoma. Omniseq NGS showed TMB 48.4 mt/mb [high], MSI High, PD-L1-TPS <1%, PIK3CA H1047Q, Negative for BRAF, HER2, NTRK1 RET.     04/10/2021 Cancer Staging   Staging form: Corpus Uteri - Carcinoma and Carcinosarcoma, AJCC 8th Edition - Clinical stage from 04/10/2021: FIGO Stage III, calculated as Stage Unknown (cT3, cNX, cM0) - Signed by Babara Call, MD on 08/24/2021 Stage prefix: Initial diagnosis   04/15/2021 Imaging   PET showed 1. Large hypermetabolic endometrial mass consistent with known endometrial cancer. Suspect direct invasion/involvement of the right adnexa. Right pelvic sidewall hypermetabolic adenopathy.2.  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 3. No findings for metastatic disease involving the chest or  bonystructures.     04/24/2021 - 05/29/2021 Radiation Therapy   status post radiation to pelvis   08/05/2021 Imaging   MRI abdomen w wo contrast 1. Stable solid enhancing 2.7 cm left adrenal lesion, which was hypermetabolic on prior PET/CTs but is unchanged in size dating back to December 12, 2020. 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 However, unfortunately metastatic disease can not be entirely excluded and remains a pertinent differential consideration. Comparison with more remote prior imaging would be the most valuable tool in the assessment of this lesion, as demonstrating long-term stability would indicate this to be a benign lesion. However, if no prior imaging can be made available, would consider follow-up adrenal protocol CT with and without intravenous contrast material in 3 months as this would allow for further assessment of stability as well as enhancement and washout characteristics of the lesion potentially allowing for better characterization versus direct tissue sampling.2. Hepatomegaly and hepatic steatosis.3. Colonic diverticulosis without findings of acute diverticulitis    08/16/2021 - 09/27/2021 Chemotherapy   UTERINE Carboplatin  AUC 5 / Paclitaxel  q21d x 3      10/03/2021 Imaging   PET 1. No residual hypermetabolism in the uterus suggesting an excellent response to treatment. No findings for metastatic disease. 2. Resolution of hypermetabolism in the left adrenal gland lesion suggesting this was metastatic disease. 3. Diffuse marrow hypermetabolism likely due to rebound from chemotherapy or marrow stimulating drugs   10/18/2021 - 11/08/2021 Chemotherapy   AUC 5 / Paclitaxel / Keytruda  Q21d  x 2 cycles   11/29/2021 -  Chemotherapy   Patient is on Treatment Plan : UTERINE Pembrolizumab  (200) q21d      04/30/2022 Imaging   CT chest abdomen pelvis w contrast  1. Similar to minimal decrease in size of a left adrenal nodule which was felt suspicious for metastatic disease on 10/03/2021 PET. 2. No evidence of new or progressive disease. 3. Coronary artery atherosclerosis. Aortic Atherosclerosis (ICD10-I70.0). 4. Incidental findings, including: Hepatomegaly. Possible constipation.   08/28/2022 Imaging   CT chest abdomen pelvis w contrast showed 1. Similar appearance of indeterminate left adrenal nodule. 2. Otherwise, no evidence of metastatic disease. 3. Hepatomegaly 4.  Possible constipation. 5. Coronary artery atherosclerosis. Aortic Atherosclerosis (ICD10-I70.0).   12/22/2022 Imaging   CT chest abdomen pelvis w contrast showed 1. Stable indeterminate left adrenal nodule. 2. Otherwise, no noncontrast enhanced CT evidence of metastatic disease. 3. Colonic diverticulosis without findings of acute diverticulitis. 4.  Aortic Atherosclerosis (ICD10-I70.0).     04/17/2023 Imaging   CT chest abdomen pelvis wo contrast  1. Similar size of the indeterminate left adrenal nodule which is unchanged back to 06/10/2021. Felt unlikely to represent metastatic disease. Recommend attention on follow-up. 2. Otherwise, no evidence of metastatic disease. 3. Aortic valvular calcifications. Consider echocardiography to evaluate for valvular dysfunction. 4.  Aortic Atherosclerosis (ICD10-I70.0).       INTERVAL HISTORY Valerie Case is a 73 y.o. female who has above history reviewed by me today presents for follow up visit for anemia and endometrial cancer Overall she tolerates treatment Today Patient denies SOB,  nausea vomiting diarrhea.   Review of Systems  Constitutional:  Positive for fatigue. Negative for chills  and fever.  HENT:   Negative for hearing loss and voice change.   Eyes:  Negative for eye problems.  Respiratory:  Negative for chest tightness and cough.   Cardiovascular:   Negative for chest pain.  Gastrointestinal:  Negative for abdominal distention, abdominal pain and blood in stool.  Endocrine: Negative for hot flashes.  Genitourinary:  Negative for difficulty urinating, frequency and vaginal bleeding.   Musculoskeletal:  Positive for arthralgias.  Skin:  Negative for itching and rash.  Neurological:  Negative for extremity weakness and headaches.  Hematological:  Negative for adenopathy.  Psychiatric/Behavioral:  Negative for confusion.     MEDICAL HISTORY:  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   Mixed hyperlipidemia    Peripheral polyneuropathy    Sleep apnea     SURGICAL HISTORY: Past Surgical History:  Procedure Laterality Date   APPENDECTOMY     BIOPSY  03/17/2023   Procedure: BIOPSY;  Surgeon: Onita Elspeth Sharper, DO;  Location: [facility] ENDOSCOPY;  Service: Gastroenterology;;   CESAREAN SECTION  01/23/1979   COLONOSCOPY     COLONOSCOPY N/A 03/17/2023   Procedure: COLONOSCOPY;  Surgeon: Onita Elspeth Sharper, DO;  Location: [facility] ENDOSCOPY;  Service: Gastroenterology;  Laterality: N/A;   COLONOSCOPY WITH PROPOFOL  N/A 03/13/2023   Procedure: COLONOSCOPY WITH PROPOFOL ;  Surgeon: Onita Elspeth Sharper, DO;  Location: [facility] ENDOSCOPY;  Service: Gastroenterology;  Laterality: N/A;   ESOPHAGOGASTRODUODENOSCOPY     ESOPHAGOGASTRODUODENOSCOPY N/A 03/17/2023   Procedure: ESOPHAGOGASTRODUODENOSCOPY (EGD);  Surgeon: Onita Elspeth Sharper, DO;  Location: [facility] ENDOSCOPY;  Service: Gastroenterology;   Laterality: N/A;   ESOPHAGOGASTRODUODENOSCOPY (EGD) WITH PROPOFOL  N/A 03/13/2023   Procedure: ESOPHAGOGASTRODUODENOSCOPY (EGD) WITH PROPOFOL ;  Surgeon: Onita Elspeth Sharper, DO;  Location: [facility] ENDOSCOPY;  Service: Gastroenterology;  Laterality: N/A;   IR IMAGING GUIDED PORT INSERTION  08/09/2021   TONSILLECTOMY  03/17/1955    SOCIAL HISTORY: Social History   Socioeconomic History   Marital status: Divorced    Spouse name: Not on file   Number of children: Not on file   Years of education: Not on file   Highest education level: Not on file  Occupational History   Not on file  Tobacco Use   Smoking status: Never   Smokeless tobacco: Never  Vaping Use   Vaping status: Never Used  Substance and Sexual Activity   Alcohol use: Not Currently   Drug use: Never   Sexual activity: Not Currently  Other Topics Concern   Not on file  Social History Narrative      Social Drivers of Health   Financial Resource Strain: Low Risk  (05/19/2023)   Received from [facility]   Overall Financial Resource Strain (CARDIA)    Difficulty of Paying Living Expenses: Not hard at all  Food Insecurity: No Food Insecurity (05/19/2023)   Received from [facility]   Hunger Vital Sign    Within the past 12 months, you worried that  your food would run out before you got the money to buy more.: Never true    Within the past 12 months, the food you bought just didn't last and you didn't have money to get more.: Never true  Transportation Needs: No Transportation Needs (05/19/2023)   Received from [facility] - Transportation    In the past 12 months, has lack of transportation kept you from medical appointments or from getting medications?: No    Lack of Transportation (Non-Medical): No  Physical Activity: Inactive (09/27/2021)   Exercise Vital Sign    Days of Exercise per Week: 0 days    Minutes of Exercise per Session: 0 min  Stress: Stress  Concern Present (09/27/2021)   [employer] of Occupational Health - Occupational Stress Questionnaire    Feeling of Stress : Rather much  Social Connections: Moderately Integrated (09/27/2021)   Social Connection and Isolation Panel    Frequency of Communication with Friends and Family: Three times a week    Frequency of Social Gatherings with Friends and Family: Three times a week    Attends Religious Services: 1 to 4 times per year    Active Member of Clubs or Organizations: No    Attends Banker Meetings: 1 to 4 times per year    Marital Status: Divorced  Catering manager Violence: Not At Risk (09/27/2021)   Humiliation, Afraid, Rape, and Kick questionnaire    Fear of Current or Ex-Partner: No    Emotionally Abused: No    Physically Abused: No    Sexually Abused: No    FAMILY HISTORY: Family History  Problem Relation Age of Onset   Breast cancer Mother 68   Cancer Mother    Cancer Father    Lung cancer Father    Breast cancer Cousin        dx 31s maternal    ALLERGIES:  is allergic to doxycycline , omeprazole, and aspirin.  MEDICATIONS:  Current Outpatient Medications  Medication Sig Dispense Refill   acetaminophen  (TYLENOL ) 650 MG CR tablet Take 1,300 mg by mouth every 8 (eight) hours as needed for pain.     albuterol  (VENTOLIN  HFA) 108 (90 Base) MCG/ACT inhaler Inhale 2 puffs into the lungs every 6 (six) hours as needed for wheezing or shortness of breath.     azelastine  (ASTELIN ) 0.1 % nasal spray Place 2 sprays into both nostrils daily. Use in each nostril as directed 30 mL 0   Cranberry-Vitamin C-Probiotic (AZO CRANBERRY PO) Take by mouth.     docusate sodium  (COLACE) 100 MG capsule Take 1 capsule (100 mg total) by mouth 2 (two) times daily. 60 capsule 1   empagliflozin (JARDIANCE) 25 MG TABS tablet Take 25 mg by mouth daily.     ferrous sulfate 325 (65 FE) MG EC tablet Take 325 mg by mouth daily with breakfast.     FLUoxetine  (PROZAC ) 10 MG capsule  Take 10 mg by mouth daily.     fluticasone  (FLONASE ) 50 MCG/ACT nasal spray Place 1 spray into both nostrils daily as needed for allergies or rhinitis.     furosemide (LASIX) 20 MG tablet Take 20 mg by mouth daily.     gabapentin  (NEURONTIN ) 100 MG capsule Take 1 capsule (100 mg total) by mouth daily. 90 capsule 1   hydrochlorothiazide  (HYDRODIURIL ) 12.5 MG tablet Take 12.5 mg by mouth daily.     hydrocortisone  2.5 % cream Apply topically 3 (three) times daily. 30 g 2  Insulin  Asp Prot & Asp FlexPen (NOVOLOG  70/30 MIX) (70-30) 100 UNIT/ML FlexPen Inject into the skin.     lidocaine -prilocaine  (EMLA ) cream Apply small amount to port and cover with saran wrap 1-2 hours prior to port access -daily PRN 30 g 3   lisinopril  (ZESTRIL ) 40 MG tablet Take 40 mg by mouth at bedtime.     loratadine (CLARITIN) 10 MG tablet Take by mouth.     LORazepam  (ATIVAN ) 0.5 MG tablet Take 1 tablet (0.5 mg total) by mouth every 8 (eight) hours as needed for anxiety (nausea vomiting). 30 tablet 0   metFORMIN  (GLUCOPHAGE -XR) 500 MG 24 hr tablet Take 500 mg by mouth at bedtime.     metoprolol  succinate (TOPROL -XL) 25 MG 24 hr tablet Take 25 mg by mouth daily.     mupirocin  ointment (BACTROBAN ) 2 % Apply 1 Application topically 3 (three) times daily. 22 g 0   NOVOLIN 70/30 FLEXPEN (70-30) 100 UNIT/ML KwikPen 50-110 Units See admin instructions. 50 units in the morning, 110 units at bedtime     Omega-3 Fatty Acids (FISH OIL) 300 MG CAPS Take by mouth.     OZEMPIC , 1 MG/DOSE, 4 MG/3ML SOPN Inject 1 mg into the skin every Tuesday.     rosuvastatin  (CRESTOR ) 10 MG tablet Take 10 mg by mouth daily.     traMADol  (ULTRAM ) 50 MG tablet Take 2 tablets (100 mg total) by mouth every 6 (six) hours as needed. 90 tablet 0   omeprazole (PRILOSEC) 20 MG capsule Take 20 mg by mouth daily. (Patient not taking: Reported on 10/05/2023)     No current facility-administered medications for this visit.   Facility-Administered Medications  Ordered in Other Visits  Medication Dose Route Frequency Provider Last Rate Last Admin   heparin  lock flush 100 UNIT/ML injection            heparin  lock flush 100 unit/mL  500 Units Intracatheter Once PRN Babara Call, MD       pembrolizumab  (KEYTRUDA ) 200 mg in sodium chloride  0.9 % 50 mL chemo infusion  200 mg Intravenous Once Babara Call, MD 116 mL/hr at 10/05/23 0950 200 mg at 10/05/23 0950     PHYSICAL EXAMINATION: ECOG PERFORMANCE STATUS: 1 - Symptomatic but completely ambulatory  Physical Exam Constitutional:      General: She is not in acute distress.    Appearance: She is obese.  HENT:     Head: Normocephalic and atraumatic.  Eyes:     General: No scleral icterus. Cardiovascular:     Rate and Rhythm: Normal rate and regular rhythm.     Heart sounds: Murmur heard.  Pulmonary:     Effort: Pulmonary effort is normal. No respiratory distress.  Abdominal:     General: Bowel sounds are normal. There is no distension.     Palpations: Abdomen is soft.  Musculoskeletal:        General: No deformity. Normal range of motion.     Cervical back: Normal range of motion.     Right lower leg: Edema present.     Left lower leg: Edema present.     Comments:    Skin:    General: Skin is warm and dry.     Findings: No rash.     Comments: Chronic venous sufficiency skin changes   Neurological:     Mental Status: She is alert and oriented to person, place, and time. Mental status is at baseline.  Psychiatric:        Mood  and Affect: Mood normal.    LABORATORY DATA:  I have reviewed the data as listed    Latest Ref Rng & Units 10/05/2023    8:48 AM 09/11/2023   10:30 AM 09/04/2023    8:46 AM  CBC  WBC 4.0 - 10.5 K/uL 7.9  7.2  7.1   Hemoglobin 12.0 - 15.0 g/dL 88.2  88.3  87.9   Hematocrit 36.0 - 46.0 % 35.8  36.1  37.1   Platelets 150 - 400 K/uL 293  282  281       Latest Ref Rng & Units 10/05/2023    8:48 AM 09/11/2023   10:30 AM 09/04/2023    8:46 AM  CMP  Glucose 70 - 99  mg/dL 830  848  873   BUN 8 - 23 mg/dL 34  49  39   Creatinine 0.44 - 1.00 mg/dL 8.72  8.60  8.74   Sodium 135 - 145 mmol/L 138  136  136   Potassium 3.5 - 5.1 mmol/L 4.3  4.6  4.8   Chloride 98 - 111 mmol/L 107  105  105   CO2 22 - 32 mmol/L 20  21  18    Calcium  8.9 - 10.3 mg/dL 9.9  89.9  89.2   Total Protein 6.5 - 8.1 g/dL 7.3  7.3  7.6   Total Bilirubin 0.0 - 1.2 mg/dL 0.4  0.6  0.5   Alkaline Phos 38 - 126 U/L 48  50  55   AST 15 - 41 U/L 21  22  24    ALT 0 - 44 U/L 16  18  18        RADIOGRAPHIC STUDIES: I have personally reviewed the radiological images as listed and agreed with the findings in the report. CT CHEST ABDOMEN PELVIS WO CONTRAST Result Date: 08/09/2023 EXAMINATION: CT CHEST ABDOMEN PELVIS WO CONTRAST CLINICAL INDICATION: Female, 73 years old. hx of endometrial cancer TECHNIQUE: Helical CT scan examination of the chest, abdomen, and pelvis is performed from the domes of the diaphragm to the pubic symphysis. Unless otherwise specified, incidental thyroid , adrenal, renal lesions do not require dedicated imaging follow up. Additionally, any mentioned pulmonary nodules do not require dedicated imaging follow-up based on the Fleischner guidelines unless otherwise specified. Coronary calcifications are not identified unless otherwise specified. COMPARISON: 04/17/2023 FINDINGS: CHEST: Right chest wall Mediport catheter tip terminates in the right atrium. The thyroid  is normal. The thoracic aorta is normal in caliber. The main pulmonary artery is normal in caliber. The heart is normal in size. There are coronary calcifications with calcifications of the aortic valve. There is no free fluid or pathologic adenopathy by size criteria. The trachea and mainstem bronchi are patent. Subsegmental atelectatic changes are noted within the lung bases. The lungs are otherwise clear. ABDOMEN/PELVIS: The liver contains a subcentimeter probable cyst. The gallbladder is normal. The spleen is normal. The  pancreas is normal. The right adrenal is normal. Stable left adrenal nodule compatible with a benign nodule most likely indicative of a lipid poor adenoma. The kidneys are normal. The abdominal aorta is normal in caliber. Scattered calcified atherosclerotic changes are present. The bladder is normal. The uterus is normal. There is colonic diverticulosis. Large and small bowel loops are otherwise within normal limits. No free fluid or lymphadenopathy. BONES: There are degenerative changes of the spine and bony pelvis. No concerning osseous lesions. IMPRESSION: No evidence for metastatic disease within the chest, abdomen, or pelvis. DOSE REDUCTION: All CT scans are performed using  radiation dose reduction techniques, when applicable. Technical factors are evaluated and adjusted to ensure appropriate moderation of exposure. Electronically signed by: [place] Engel MD 08/09/2023 09:50 AM EDT RP Workstation: [id number]

## 2023-10-05 NOTE — Assessment & Plan Note (Addendum)
 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 s/p carboplatin and Taxol for 5 cycles, on Keytruda maintenance.  Labs are reviewed and discussed with patient.  Proceed with Keytruda CT showed NED/stable disease,stable adrenal nodule

## 2023-10-05 NOTE — Assessment & Plan Note (Signed)
 Stable hemoglobin

## 2023-10-05 NOTE — Assessment & Plan Note (Signed)
 Encourage oral hydration and avoid nephrotoxins.  Acute increase of creatinine, likely due to dehydration.  Patient will get IV fluid 1L of NS today.  Recommend patient to hold off Lasix.  Encouraged her to discuss with primary care provider regarding her kidney function and Lasix use.

## 2023-10-05 NOTE — Patient Instructions (Signed)
 [facility] - A DEPT OF [facility]  Discharge Instructions: Thank you for choosing [facility] to provide your oncology and hematology care.  If you have a lab appointment with the Cancer Center, please go directly to the Cancer Center and check in at the registration area.  Wear comfortable clothing and clothing appropriate for easy access to any Portacath or PICC line.   We strive to give you quality time with your provider. You may need to reschedule your appointment if you arrive late (15 or more minutes).  Arriving late affects you and other patients whose appointments are after yours.  Also, if you miss three or more appointments without notifying the office, you may be dismissed from the clinic at the provider's discretion.      For prescription refill requests, have your pharmacy contact our office and allow 72 hours for refills to be completed.    Today you received the following chemotherapy and/or immunotherapy agents Keytruda      To help prevent nausea and vomiting after your treatment, we encourage you to take your nausea medication as directed.  BELOW ARE SYMPTOMS THAT SHOULD BE REPORTED IMMEDIATELY: *FEVER GREATER THAN 100.4 F (38 C) OR HIGHER *CHILLS OR SWEATING *NAUSEA AND VOMITING THAT IS NOT CONTROLLED WITH YOUR NAUSEA MEDICATION *UNUSUAL SHORTNESS OF BREATH *UNUSUAL BRUISING OR BLEEDING *URINARY PROBLEMS (pain or burning when urinating, or frequent urination) *BOWEL PROBLEMS (unusual diarrhea, constipation, pain near the anus) TENDERNESS IN MOUTH AND THROAT WITH OR WITHOUT PRESENCE OF ULCERS (sore throat, sores in mouth, or a toothache) UNUSUAL RASH, SWELLING OR PAIN  UNUSUAL VAGINAL DISCHARGE OR ITCHING   Items with * indicate a potential emergency and should be followed up as soon as possible or go to the Emergency Department if any problems should occur.  Please show the CHEMOTHERAPY ALERT CARD or IMMUNOTHERAPY  ALERT CARD at check-in to the Emergency Department and triage nurse.  Should you have questions after your visit or need to cancel or reschedule your appointment, please contact [facility] - A DEPT OF [facility]  [phone] and follow the prompts.  Office hours are 8:00 a.m. to 4:30 p.m. Monday - Friday. Please note that voicemails left after 4:00 p.m. may not be returned until the following business day.  We are closed weekends and major holidays. You have access to a nurse at all times for urgent questions. Please call the main number to the clinic [phone] and follow the prompts.  For any non-urgent questions, you may also contact your provider using MyChart. We now offer e-Visits for anyone 58 and older to request care online for non-urgent symptoms. For details visit [email or web address].   Also download the MyChart app! Go to the app store, search "MyChart", open the app, select [place], and log in with your MyChart username and password.

## 2023-10-05 NOTE — Assessment & Plan Note (Signed)
Continue gabapentin 100mg  daily.

## 2023-10-06 ENCOUNTER — Other Ambulatory Visit: Payer: Self-pay

## 2023-10-16 ENCOUNTER — Other Ambulatory Visit: Payer: Self-pay

## 2023-10-26 ENCOUNTER — Inpatient Hospital Stay: Attending: Obstetrics and Gynecology

## 2023-10-26 ENCOUNTER — Inpatient Hospital Stay

## 2023-10-26 ENCOUNTER — Encounter: Payer: Self-pay | Admitting: Oncology

## 2023-10-26 ENCOUNTER — Inpatient Hospital Stay (HOSPITAL_BASED_OUTPATIENT_CLINIC_OR_DEPARTMENT_OTHER): Admitting: Oncology

## 2023-10-26 VITALS — BP 134/62 | HR 99 | Temp 96.9°F | Wt 278.7 lb

## 2023-10-26 DIAGNOSIS — N189 Chronic kidney disease, unspecified: Secondary | ICD-10-CM

## 2023-10-26 DIAGNOSIS — Z9089 Acquired absence of other organs: Secondary | ICD-10-CM | POA: Insufficient documentation

## 2023-10-26 DIAGNOSIS — Z5112 Encounter for antineoplastic immunotherapy: Secondary | ICD-10-CM | POA: Diagnosis not present

## 2023-10-26 DIAGNOSIS — Z923 Personal history of irradiation: Secondary | ICD-10-CM | POA: Diagnosis not present

## 2023-10-26 DIAGNOSIS — Z801 Family history of malignant neoplasm of trachea, bronchus and lung: Secondary | ICD-10-CM | POA: Diagnosis not present

## 2023-10-26 DIAGNOSIS — Z86018 Personal history of other benign neoplasm: Secondary | ICD-10-CM | POA: Diagnosis not present

## 2023-10-26 DIAGNOSIS — D631 Anemia in chronic kidney disease: Secondary | ICD-10-CM

## 2023-10-26 DIAGNOSIS — Z803 Family history of malignant neoplasm of breast: Secondary | ICD-10-CM | POA: Insufficient documentation

## 2023-10-26 DIAGNOSIS — C541 Malignant neoplasm of endometrium: Secondary | ICD-10-CM

## 2023-10-26 DIAGNOSIS — N1831 Chronic kidney disease, stage 3a: Secondary | ICD-10-CM | POA: Diagnosis not present

## 2023-10-26 DIAGNOSIS — K760 Fatty (change of) liver, not elsewhere classified: Secondary | ICD-10-CM | POA: Diagnosis not present

## 2023-10-26 DIAGNOSIS — Z881 Allergy status to other antibiotic agents status: Secondary | ICD-10-CM | POA: Insufficient documentation

## 2023-10-26 DIAGNOSIS — R5383 Other fatigue: Secondary | ICD-10-CM | POA: Insufficient documentation

## 2023-10-26 DIAGNOSIS — K5730 Diverticulosis of large intestine without perforation or abscess without bleeding: Secondary | ICD-10-CM | POA: Diagnosis not present

## 2023-10-26 DIAGNOSIS — I129 Hypertensive chronic kidney disease with stage 1 through stage 4 chronic kidney disease, or unspecified chronic kidney disease: Secondary | ICD-10-CM | POA: Diagnosis not present

## 2023-10-26 DIAGNOSIS — E1122 Type 2 diabetes mellitus with diabetic chronic kidney disease: Secondary | ICD-10-CM | POA: Insufficient documentation

## 2023-10-26 DIAGNOSIS — G629 Polyneuropathy, unspecified: Secondary | ICD-10-CM

## 2023-10-26 DIAGNOSIS — Z809 Family history of malignant neoplasm, unspecified: Secondary | ICD-10-CM | POA: Insufficient documentation

## 2023-10-26 DIAGNOSIS — M2550 Pain in unspecified joint: Secondary | ICD-10-CM | POA: Insufficient documentation

## 2023-10-26 DIAGNOSIS — E278 Other specified disorders of adrenal gland: Secondary | ICD-10-CM | POA: Diagnosis not present

## 2023-10-26 DIAGNOSIS — N183 Chronic kidney disease, stage 3 unspecified: Secondary | ICD-10-CM | POA: Insufficient documentation

## 2023-10-26 DIAGNOSIS — Z886 Allergy status to analgesic agent status: Secondary | ICD-10-CM | POA: Insufficient documentation

## 2023-10-26 DIAGNOSIS — Z79899 Other long term (current) drug therapy: Secondary | ICD-10-CM | POA: Diagnosis not present

## 2023-10-26 DIAGNOSIS — Z8719 Personal history of other diseases of the digestive system: Secondary | ICD-10-CM | POA: Insufficient documentation

## 2023-10-26 DIAGNOSIS — I700 Atherosclerosis of aorta: Secondary | ICD-10-CM | POA: Insufficient documentation

## 2023-10-26 DIAGNOSIS — Z9049 Acquired absence of other specified parts of digestive tract: Secondary | ICD-10-CM | POA: Diagnosis not present

## 2023-10-26 LAB — CBC WITH DIFFERENTIAL/PLATELET
Abs Immature Granulocytes: 0.06 K/uL (ref 0.00–0.07)
Basophils Absolute: 0 K/uL (ref 0.0–0.1)
Basophils Relative: 0 %
Eosinophils Absolute: 0.3 K/uL (ref 0.0–0.5)
Eosinophils Relative: 4 %
HCT: 36.4 % (ref 36.0–46.0)
Hemoglobin: 11.6 g/dL — ABNORMAL LOW (ref 12.0–15.0)
Immature Granulocytes: 1 %
Lymphocytes Relative: 14 %
Lymphs Abs: 1.1 K/uL (ref 0.7–4.0)
MCH: 30.6 pg (ref 26.0–34.0)
MCHC: 31.9 g/dL (ref 30.0–36.0)
MCV: 96 fL (ref 80.0–100.0)
Monocytes Absolute: 0.7 K/uL (ref 0.1–1.0)
Monocytes Relative: 9 %
Neutro Abs: 5.4 K/uL (ref 1.7–7.7)
Neutrophils Relative %: 72 %
Platelets: 252 K/uL (ref 150–400)
RBC: 3.79 MIL/uL — ABNORMAL LOW (ref 3.87–5.11)
RDW: 13.2 % (ref 11.5–15.5)
WBC: 7.5 K/uL (ref 4.0–10.5)
nRBC: 0 % (ref 0.0–0.2)

## 2023-10-26 LAB — COMPREHENSIVE METABOLIC PANEL WITH GFR
ALT: 15 U/L (ref 0–44)
AST: 26 U/L (ref 15–41)
Albumin: 3.7 g/dL (ref 3.5–5.0)
Alkaline Phosphatase: 50 U/L (ref 38–126)
Anion gap: 14 (ref 5–15)
BUN: 32 mg/dL — ABNORMAL HIGH (ref 8–23)
CO2: 16 mmol/L — ABNORMAL LOW (ref 22–32)
Calcium: 10.1 mg/dL (ref 8.9–10.3)
Chloride: 108 mmol/L (ref 98–111)
Creatinine, Ser: 1.1 mg/dL — ABNORMAL HIGH (ref 0.44–1.00)
GFR, Estimated: 53 mL/min — ABNORMAL LOW (ref >60)
Glucose, Bld: 210 mg/dL — ABNORMAL HIGH (ref 70–99)
Potassium: 4.3 mmol/L (ref 3.5–5.1)
Sodium: 138 mmol/L (ref 135–145)
Total Bilirubin: 0.3 mg/dL (ref 0.0–1.2)
Total Protein: 7 g/dL (ref 6.5–8.1)

## 2023-10-26 MED ORDER — SODIUM CHLORIDE 0.9 % IV SOLN
200.0000 mg | Freq: Once | INTRAVENOUS | Status: AC
  Administered 2023-10-26 (×2): 200 mg via INTRAVENOUS
  Filled 2023-10-26: qty 8

## 2023-10-26 MED ORDER — SODIUM CHLORIDE 0.9 % IV SOLN
Freq: Once | INTRAVENOUS | Status: AC
Start: 2023-10-26 — End: 2023-10-26
  Filled 2023-10-26: qty 250

## 2023-10-26 MED ORDER — HEPARIN SOD (PORK) LOCK FLUSH 100 UNIT/ML IV SOLN
500.0000 [IU] | Freq: Once | INTRAVENOUS | Status: DC | PRN
Start: 2023-10-26 — End: 2023-10-26
  Filled 2023-10-26: qty 5

## 2023-10-26 NOTE — Assessment & Plan Note (Signed)
 Encourage oral hydration and avoid nephrotoxins.  Stable creatinine

## 2023-10-26 NOTE — Assessment & Plan Note (Signed)
 Stable hemoglobin

## 2023-10-26 NOTE — Assessment & Plan Note (Signed)
 Treatment plan as listed above.

## 2023-10-26 NOTE — Assessment & Plan Note (Addendum)
 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 s/p carboplatin  and Taxol  for 5 cycles, on Keytruda  maintenance.  Labs are reviewed and discussed with patient.  Proceed with Keytruda   May 2025 CT showed NED/stable disease,stable adrenal nodule

## 2023-10-26 NOTE — Progress Notes (Signed)
 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 s/p carboplatin  and Taxol  for 5 cycles, on Keytruda  maintenance.  Labs are reviewed and discussed with patient.  Proceed with Keytruda   May 2025 CT showed NED/stable disease,stable adrenal nodule  Anemia in chronic kidney disease (CKD) Stable hemoglobin  Adrenal mass (HCC) lipid poor hyperfunctioning adenoma vs mets.  FDG avid on PET scan.  Difficult biopsy due to patient's body habitus and deep position of the mass. Resolved on subsequent PET scan after treatments, suggesting that this lesion might be a metastatic lesion. CT showed stable lesion  Continue monitor   Encounter for antineoplastic immunotherapy Treatment plan as listed above.   Neuropathy Continue  gabapentin  100mg  daily  CKD (chronic kidney disease) stage 3, GFR 30-59 ml/min (HCC) Encourage oral hydration and avoid nephrotoxins.  Stable creatinine   No orders of the defined types were placed in this encounter.   Return of visit:  3 weeks lab MD Keytruda   Call Babara, MD, [name] [facility] Hematology Oncology 10/26/2023     HISTORY OF PRESENTING ILLNESS:   Valerie Case is a  73 y.o.  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 Oncology History  Endometrial adenocarcinoma (HCC)  12/12/2020 Imaging   12/12/2020, CT abdomen pelvis with contrast showed no radiographic evidence of urinary tract neoplasm, calculi,  hydronephrosis.  2.6 cm nonspecific left adrenal mass. 02/12/2021 CT hematuria work-up showed small uterine fibroids, no findings to explain hematuria.Hepatomegaly, diverticulosis without evidence of diverticulitis, Coronary artery disease   04/01/2021 -  Hospital Admission   04/01/2021 - 04/03/2021, patient was hospitalized due to symptomatic anemia, hemoglobin 6.9, heavy postmenopausal bleeding with passing large clots.  She also presented with increased creatinine level to 2.58 compared to her baseline level of 0.9 in November 2022.  Patient received IV iron  infusion and 2 units of PRBC during the hospital stay..  04/02/2021, iron  panel showed iron  saturation 37, ferritin 37, TIBC 333-the studies were done after patient received blood transfusion.  At discharge, hemoglobin was 7.7.   04/01/2021 Initial Diagnosis   04/01/2020, endometrial biopsy showed poorly differentiated endometrial adenocarcinoma. Omniseq NGS showed TMB 48.4 mt/mb [high], MSI High, PD-L1-TPS <1%, PIK3CA H1047Q, Negative for BRAF, HER2, NTRK1 RET.     04/10/2021 Cancer Staging   Staging form: Corpus Uteri - Carcinoma and Carcinosarcoma, AJCC 8th Edition - Clinical stage from 04/10/2021: FIGO Stage III, calculated as Stage Unknown (cT3, cNX, cM0) - Signed by Babara Call, MD on 08/24/2021 Stage prefix: Initial diagnosis   04/15/2021 Imaging   PET showed 1. Large hypermetabolic endometrial mass consistent with known endometrial cancer. Suspect direct invasion/involvement of the right adnexa. Right pelvic sidewall hypermetabolic adenopathy.2. 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 3. No findings for metastatic disease involving the chest or bonystructures.     04/24/2021 - 05/29/2021 Radiation Therapy   status post radiation to pelvis   08/05/2021 Imaging   MRI abdomen w wo contrast 1. Stable solid enhancing 2.7 cm left adrenal  lesion, which  was hypermetabolic on prior PET/CTs but is unchanged in size dating back to December 12, 2020. 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 However, unfortunately metastatic disease can not be entirely excluded and remains a pertinent differential consideration. Comparison with more remote prior imaging would be the most valuable tool in the assessment of this lesion, as demonstrating long-term stability would indicate this to be a benign lesion. However, if no prior imaging can be made available, would consider follow-up adrenal protocol CT with and without intravenous contrast material in 3 months as this would allow for further assessment of stability as well as enhancement and washout characteristics of the lesion potentially allowing for better characterization versus direct tissue sampling.2. Hepatomegaly and hepatic steatosis.3. Colonic diverticulosis without findings of acute diverticulitis    08/16/2021 - 09/27/2021 Chemotherapy   UTERINE Carboplatin  AUC 5 / Paclitaxel  q21d x 3      10/03/2021 Imaging   PET 1. No residual hypermetabolism in the uterus suggesting an excellent response to treatment. No findings for metastatic disease. 2. Resolution of hypermetabolism in the left adrenal gland lesion suggesting this was metastatic disease. 3. Diffuse marrow hypermetabolism likely due to rebound from chemotherapy or marrow stimulating drugs   10/18/2021 - 11/08/2021 Chemotherapy   AUC 5 / Paclitaxel / Keytruda  Q21d  x 2 cycles   11/29/2021 -  Chemotherapy   Patient is on Treatment Plan : UTERINE Pembrolizumab  (200) q21d     04/30/2022 Imaging   CT chest abdomen pelvis w contrast  1. Similar to minimal decrease in size of a left adrenal nodule which was felt suspicious for metastatic disease on 10/03/2021 PET. 2. No evidence of new  or progressive disease. 3. Coronary artery atherosclerosis. Aortic Atherosclerosis (ICD10-I70.0). 4. Incidental findings, including: Hepatomegaly. Possible constipation.   08/28/2022 Imaging   CT chest abdomen pelvis w contrast showed 1. Similar appearance of indeterminate left adrenal nodule. 2. Otherwise, no evidence of metastatic disease. 3. Hepatomegaly 4.  Possible constipation. 5. Coronary artery atherosclerosis. Aortic Atherosclerosis (ICD10-I70.0).   12/22/2022 Imaging   CT chest abdomen pelvis w contrast showed 1. Stable indeterminate left adrenal nodule. 2. Otherwise, no noncontrast enhanced CT evidence of metastatic disease. 3. Colonic diverticulosis without findings of acute diverticulitis. 4.  Aortic Atherosclerosis (ICD10-I70.0).     04/17/2023 Imaging   CT chest abdomen pelvis wo contrast  1. Similar size of the indeterminate left adrenal nodule which is unchanged back to 06/10/2021. Felt unlikely to represent metastatic disease. Recommend attention on follow-up. 2. Otherwise, no evidence of metastatic disease. 3. Aortic valvular calcifications. Consider echocardiography to evaluate for valvular dysfunction. 4.  Aortic Atherosclerosis (ICD10-I70.0).       INTERVAL HISTORY Valerie Case is a 73 y.o. female who has above history reviewed by me today presents for follow up visit for anemia and endometrial cancer Overall she tolerates treatment Today Patient denies SOB,  nausea vomiting diarrhea. Off lasix currently.    Review of Systems  Constitutional:  Positive for fatigue. Negative for chills and fever.  HENT:   Negative for hearing loss and voice change.   Eyes:  Negative for eye problems.  Respiratory:  Negative for chest tightness and cough.   Cardiovascular:  Negative for chest pain.  Gastrointestinal:  Negative for abdominal distention, abdominal pain and blood in stool.  Endocrine: Negative for hot flashes.  Genitourinary:  Negative  for  difficulty urinating, frequency and vaginal bleeding.   Musculoskeletal:  Positive for arthralgias.  Skin:  Negative for itching and rash.  Neurological:  Negative for extremity weakness and headaches.  Hematological:  Negative for adenopathy.  Psychiatric/Behavioral:  Negative for confusion.     MEDICAL HISTORY:  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   Mixed hyperlipidemia    Peripheral polyneuropathy    Sleep apnea     SURGICAL HISTORY: Past Surgical History:  Procedure Laterality Date   APPENDECTOMY     BIOPSY  03/17/2023   Procedure: BIOPSY;  Surgeon: Onita Elspeth Sharper, DO;  Location: [facility] ENDOSCOPY;  Service: Gastroenterology;;   CESAREAN SECTION  01/23/1979   COLONOSCOPY     COLONOSCOPY N/A 03/17/2023   Procedure: COLONOSCOPY;  Surgeon: Onita Elspeth Sharper, DO;  Location: [facility] ENDOSCOPY;  Service: Gastroenterology;  Laterality: N/A;   COLONOSCOPY WITH PROPOFOL  N/A 03/13/2023   Procedure: COLONOSCOPY WITH PROPOFOL ;  Surgeon: Onita Elspeth Sharper, DO;  Location: [facility] ENDOSCOPY;  Service: Gastroenterology;  Laterality: N/A;   ESOPHAGOGASTRODUODENOSCOPY     ESOPHAGOGASTRODUODENOSCOPY N/A 03/17/2023   Procedure: ESOPHAGOGASTRODUODENOSCOPY (EGD);  Surgeon: Onita Elspeth Sharper, DO;  Location: [facility] ENDOSCOPY;  Service: Gastroenterology;  Laterality: N/A;   ESOPHAGOGASTRODUODENOSCOPY (EGD) WITH PROPOFOL  N/A 03/13/2023   Procedure: ESOPHAGOGASTRODUODENOSCOPY (EGD) WITH PROPOFOL ;  Surgeon: Onita Elspeth Sharper, DO;   Location: [facility] ENDOSCOPY;  Service: Gastroenterology;  Laterality: N/A;   IR IMAGING GUIDED PORT INSERTION  08/09/2021   TONSILLECTOMY  03/17/1955    SOCIAL HISTORY: Social History   Socioeconomic History   Marital status: Divorced    Spouse name: Not on file   Number of children: Not on file   Years of education: Not on file   Highest education level: Not on file  Occupational History   Not on file  Tobacco Use   Smoking status: Never   Smokeless tobacco: Never  Vaping Use   Vaping status: Never Used  Substance and Sexual Activity   Alcohol use: Not Currently   Drug use: Never   Sexual activity: Not Currently  Other Topics Concern   Not on file  Social History Narrative      Social Drivers of Health   Financial Resource Strain: Low Risk  (05/19/2023)   Received from [facility]   Overall Financial Resource Strain (CARDIA)    Difficulty of Paying Living Expenses: Not hard at all  Food Insecurity: No Food Insecurity (05/19/2023)   Received from [facility]   Hunger Vital Sign    Within the past 12 months, you worried that your food would run out before you got the money to buy more.: Never true    Within the past 12 months, the food you bought just didn't last and you didn't have money to get more.: Never true  Transportation Needs: No Transportation Needs (05/19/2023)   Received from [facility]   [facility] - Transportation  In the past 12 months, has lack of transportation kept you from medical appointments or from getting medications?: No    Lack of Transportation (Non-Medical): No  Physical Activity: Inactive (09/27/2021)   Exercise Vital Sign    Days of Exercise per Week: 0 days    Minutes of Exercise per Session: 0 min  Stress: Stress Concern Present (09/27/2021)   [employer] of Occupational Health - Occupational Stress Questionnaire    Feeling of Stress : Rather much  Social Connections: Moderately  Integrated (09/27/2021)   Social Connection and Isolation Panel    Frequency of Communication with Friends and Family: Three times a week    Frequency of Social Gatherings with Friends and Family: Three times a week    Attends Religious Services: 1 to 4 times per year    Active Member of Clubs or Organizations: No    Attends Banker Meetings: 1 to 4 times per year    Marital Status: Divorced  Catering manager Violence: Not At Risk (09/27/2021)   Humiliation, Afraid, Rape, and Kick questionnaire    Fear of Current or Ex-Partner: No    Emotionally Abused: No    Physically Abused: No    Sexually Abused: No    FAMILY HISTORY: Family History  Problem Relation Age of Onset   Breast cancer Mother 41   Cancer Mother    Cancer Father    Lung cancer Father    Breast cancer Cousin        dx 47s maternal    ALLERGIES:  is allergic to doxycycline , omeprazole, and aspirin.  MEDICATIONS:  Current Outpatient Medications  Medication Sig Dispense Refill   acetaminophen  (TYLENOL ) 650 MG CR tablet Take 1,300 mg by mouth every 8 (eight) hours as needed for pain.     albuterol  (VENTOLIN  HFA) 108 (90 Base) MCG/ACT inhaler Inhale 2 puffs into the lungs every 6 (six) hours as needed for wheezing or shortness of breath.     azelastine  (ASTELIN ) 0.1 % nasal spray Place 2 sprays into both nostrils daily. Use in each nostril as directed 30 mL 0   Cranberry-Vitamin C-Probiotic (AZO CRANBERRY PO) Take by mouth.     docusate sodium  (COLACE) 100 MG capsule Take 1 capsule (100 mg total) by mouth 2 (two) times daily. 60 capsule 1   empagliflozin (JARDIANCE) 25 MG TABS tablet Take 25 mg by mouth daily.     ferrous sulfate 325 (65 FE) MG EC tablet Take 325 mg by mouth daily with breakfast.     FLUoxetine  (PROZAC ) 10 MG capsule Take 10 mg by mouth daily.     fluticasone  (FLONASE ) 50 MCG/ACT nasal spray Place 1 spray into both nostrils daily as needed for allergies or rhinitis.     furosemide (LASIX) 20  MG tablet Take 20 mg by mouth daily.     gabapentin  (NEURONTIN ) 100 MG capsule Take 1 capsule (100 mg total) by mouth daily. 90 capsule 1   hydrochlorothiazide  (HYDRODIURIL ) 12.5 MG tablet Take 12.5 mg by mouth daily.     hydrocortisone  2.5 % cream Apply topically 3 (three) times daily. 30 g 2   Insulin  Asp Prot & Asp FlexPen (NOVOLOG  70/30 MIX) (70-30) 100 UNIT/ML FlexPen Inject into the skin.     lidocaine -prilocaine  (EMLA ) cream Apply small amount to port and cover with saran wrap 1-2 hours prior to port access -daily PRN 30 g 3   lisinopril  (ZESTRIL ) 40 MG tablet Take 40 mg by mouth at bedtime.  loratadine (CLARITIN) 10 MG tablet Take by mouth.     LORazepam  (ATIVAN ) 0.5 MG tablet Take 1 tablet (0.5 mg total) by mouth every 8 (eight) hours as needed for anxiety (nausea vomiting). 30 tablet 0   metFORMIN  (GLUCOPHAGE -XR) 500 MG 24 hr tablet Take 500 mg by mouth at bedtime.     metoprolol  succinate (TOPROL -XL) 25 MG 24 hr tablet Take 25 mg by mouth daily.     mupirocin  ointment (BACTROBAN ) 2 % Apply 1 Application topically 3 (three) times daily. 22 g 0   NOVOLIN 70/30 FLEXPEN (70-30) 100 UNIT/ML KwikPen 50-110 Units See admin instructions. 50 units in the morning, 110 units at bedtime     Omega-3 Fatty Acids (FISH OIL) 300 MG CAPS Take by mouth.     OZEMPIC , 1 MG/DOSE, 4 MG/3ML SOPN Inject 1 mg into the skin every Tuesday.     rosuvastatin  (CRESTOR ) 10 MG tablet Take 10 mg by mouth daily.     traMADol  (ULTRAM ) 50 MG tablet Take 2 tablets (100 mg total) by mouth every 6 (six) hours as needed. 90 tablet 0   omeprazole (PRILOSEC) 20 MG capsule Take 20 mg by mouth daily. (Patient not taking: Reported on 10/26/2023)     No current facility-administered medications for this visit.   Facility-Administered Medications Ordered in Other Visits  Medication Dose Route Frequency Provider Last Rate Last Admin   heparin  lock flush 100 UNIT/ML injection            heparin  lock flush 100 unit/mL  500 Units  Intracatheter Once PRN Babara Call, MD       pembrolizumab  (KEYTRUDA ) 200 mg in sodium chloride  0.9 % 50 mL chemo infusion  200 mg Intravenous Once Babara Call, MD         PHYSICAL EXAMINATION: ECOG PERFORMANCE STATUS: 1 - Symptomatic but completely ambulatory  Physical Exam Constitutional:      General: She is not in acute distress.    Appearance: She is obese.  HENT:     Head: Normocephalic and atraumatic.  Eyes:     General: No scleral icterus. Cardiovascular:     Rate and Rhythm: Normal rate and regular rhythm.     Heart sounds: Murmur heard.  Pulmonary:     Effort: Pulmonary effort is normal. No respiratory distress.  Abdominal:     General: Bowel sounds are normal. There is no distension.     Palpations: Abdomen is soft.  Musculoskeletal:        General: No deformity. Normal range of motion.     Cervical back: Normal range of motion.     Right lower leg: Edema present.     Left lower leg: Edema present.     Comments:    Skin:    General: Skin is warm and dry.     Findings: No rash.     Comments: Chronic venous sufficiency skin changes   Neurological:     Mental Status: She is alert and oriented to person, place, and time. Mental status is at baseline.  Psychiatric:        Mood and Affect: Mood normal.    LABORATORY DATA:  I have reviewed the data as listed    Latest Ref Rng & Units 10/26/2023    8:10 AM 10/05/2023    8:48 AM 09/11/2023   10:30 AM  CBC  WBC 4.0 - 10.5 K/uL 7.5  7.9  7.2   Hemoglobin 12.0 - 15.0 g/dL 88.3  88.2  11.6  Hematocrit 36.0 - 46.0 % 36.4  35.8  36.1   Platelets 150 - 400 K/uL 252  293  282       Latest Ref Rng & Units 10/26/2023    8:10 AM 10/05/2023    8:48 AM 09/11/2023   10:30 AM  CMP  Glucose 70 - 99 mg/dL 789  830  848   BUN 8 - 23 mg/dL 32  34  49   Creatinine 0.44 - 1.00 mg/dL 8.89  8.72  8.60   Sodium 135 - 145 mmol/L 138  138  136   Potassium 3.5 - 5.1 mmol/L 4.3  4.3  4.6   Chloride 98 - 111 mmol/L 108  107  105   CO2 22  - 32 mmol/L 16  20  21    Calcium  8.9 - 10.3 mg/dL 89.8  9.9  89.9   Total Protein 6.5 - 8.1 g/dL 7.0  7.3  7.3   Total Bilirubin 0.0 - 1.2 mg/dL 0.3  0.4  0.6   Alkaline Phos 38 - 126 U/L 50  48  50   AST 15 - 41 U/L 26  21  22    ALT 0 - 44 U/L 15  16  18        RADIOGRAPHIC STUDIES: I have personally reviewed the radiological images as listed and agreed with the findings in the report. CT CHEST ABDOMEN PELVIS WO CONTRAST Result Date: 08/09/2023 EXAMINATION: CT CHEST ABDOMEN PELVIS WO CONTRAST CLINICAL INDICATION: Female, 73 years old. hx of endometrial cancer TECHNIQUE: Helical CT scan examination of the chest, abdomen, and pelvis is performed from the domes of the diaphragm to the pubic symphysis. Unless otherwise specified, incidental thyroid , adrenal, renal lesions do not require dedicated imaging follow up. Additionally, any mentioned pulmonary nodules do not require dedicated imaging follow-up based on the Fleischner guidelines unless otherwise specified. Coronary calcifications are not identified unless otherwise specified. COMPARISON: 04/17/2023 FINDINGS: CHEST: Right chest wall Mediport catheter tip terminates in the right atrium. The thyroid  is normal. The thoracic aorta is normal in caliber. The main pulmonary artery is normal in caliber. The heart is normal in size. There are coronary calcifications with calcifications of the aortic valve. There is no free fluid or pathologic adenopathy by size criteria. The trachea and mainstem bronchi are patent. Subsegmental atelectatic changes are noted within the lung bases. The lungs are otherwise clear. ABDOMEN/PELVIS: The liver contains a subcentimeter probable cyst. The gallbladder is normal. The spleen is normal. The pancreas is normal. The right adrenal is normal. Stable left adrenal nodule compatible with a benign nodule most likely indicative of a lipid poor adenoma. The kidneys are normal. The abdominal aorta is normal in caliber. Scattered  calcified atherosclerotic changes are present. The bladder is normal. The uterus is normal. There is colonic diverticulosis. Large and small bowel loops are otherwise within normal limits. No free fluid or lymphadenopathy. BONES: There are degenerative changes of the spine and bony pelvis. No concerning osseous lesions. IMPRESSION: No evidence for metastatic disease within the chest, abdomen, or pelvis. DOSE REDUCTION: All CT scans are performed using radiation dose reduction techniques, when applicable. Technical factors are evaluated and adjusted to ensure appropriate moderation of exposure. Electronically signed by: [place] Engel MD 08/09/2023 09:50 AM EDT RP Workstation: [id number]

## 2023-10-26 NOTE — Assessment & Plan Note (Signed)
Continue gabapentin 100mg  daily.

## 2023-10-26 NOTE — Assessment & Plan Note (Signed)
 lipid poor hyperfunctioning adenoma vs mets.  FDG avid on PET scan.  Difficult biopsy due to patient's body habitus and deep position of the mass. Resolved on subsequent PET scan after treatments, suggesting that this lesion might be a metastatic lesion. CT showed stable lesion  Continue monitor

## 2023-11-17 ENCOUNTER — Inpatient Hospital Stay: Attending: Obstetrics and Gynecology

## 2023-11-17 ENCOUNTER — Encounter: Payer: Self-pay | Admitting: Oncology

## 2023-11-17 ENCOUNTER — Inpatient Hospital Stay

## 2023-11-17 ENCOUNTER — Inpatient Hospital Stay: Admitting: Oncology

## 2023-11-17 VITALS — BP 118/66 | HR 81 | Resp 18

## 2023-11-17 VITALS — BP 124/75 | HR 101 | Temp 99.5°F | Resp 18 | Wt 279.9 lb

## 2023-11-17 DIAGNOSIS — Z886 Allergy status to analgesic agent status: Secondary | ICD-10-CM | POA: Insufficient documentation

## 2023-11-17 DIAGNOSIS — N1831 Chronic kidney disease, stage 3a: Secondary | ICD-10-CM

## 2023-11-17 DIAGNOSIS — N189 Chronic kidney disease, unspecified: Secondary | ICD-10-CM

## 2023-11-17 DIAGNOSIS — G629 Polyneuropathy, unspecified: Secondary | ICD-10-CM

## 2023-11-17 DIAGNOSIS — I2510 Atherosclerotic heart disease of native coronary artery without angina pectoris: Secondary | ICD-10-CM | POA: Diagnosis not present

## 2023-11-17 DIAGNOSIS — D121 Benign neoplasm of appendix: Secondary | ICD-10-CM | POA: Insufficient documentation

## 2023-11-17 DIAGNOSIS — Z5112 Encounter for antineoplastic immunotherapy: Secondary | ICD-10-CM | POA: Diagnosis present

## 2023-11-17 DIAGNOSIS — E278 Other specified disorders of adrenal gland: Secondary | ICD-10-CM

## 2023-11-17 DIAGNOSIS — D631 Anemia in chronic kidney disease: Secondary | ICD-10-CM

## 2023-11-17 DIAGNOSIS — E1122 Type 2 diabetes mellitus with diabetic chronic kidney disease: Secondary | ICD-10-CM | POA: Diagnosis not present

## 2023-11-17 DIAGNOSIS — D259 Leiomyoma of uterus, unspecified: Secondary | ICD-10-CM | POA: Insufficient documentation

## 2023-11-17 DIAGNOSIS — Z9221 Personal history of antineoplastic chemotherapy: Secondary | ICD-10-CM | POA: Insufficient documentation

## 2023-11-17 DIAGNOSIS — K8020 Calculus of gallbladder without cholecystitis without obstruction: Secondary | ICD-10-CM | POA: Diagnosis not present

## 2023-11-17 DIAGNOSIS — Z8719 Personal history of other diseases of the digestive system: Secondary | ICD-10-CM | POA: Insufficient documentation

## 2023-11-17 DIAGNOSIS — C541 Malignant neoplasm of endometrium: Secondary | ICD-10-CM | POA: Insufficient documentation

## 2023-11-17 DIAGNOSIS — K760 Fatty (change of) liver, not elsewhere classified: Secondary | ICD-10-CM | POA: Diagnosis not present

## 2023-11-17 DIAGNOSIS — N183 Chronic kidney disease, stage 3 unspecified: Secondary | ICD-10-CM | POA: Insufficient documentation

## 2023-11-17 DIAGNOSIS — C7970 Secondary malignant neoplasm of unspecified adrenal gland: Secondary | ICD-10-CM | POA: Insufficient documentation

## 2023-11-17 DIAGNOSIS — Z9049 Acquired absence of other specified parts of digestive tract: Secondary | ICD-10-CM | POA: Insufficient documentation

## 2023-11-17 DIAGNOSIS — M6208 Separation of muscle (nontraumatic), other site: Secondary | ICD-10-CM | POA: Diagnosis not present

## 2023-11-17 DIAGNOSIS — Z7985 Long-term (current) use of injectable non-insulin antidiabetic drugs: Secondary | ICD-10-CM | POA: Insufficient documentation

## 2023-11-17 DIAGNOSIS — Z923 Personal history of irradiation: Secondary | ICD-10-CM | POA: Diagnosis not present

## 2023-11-17 DIAGNOSIS — Z79899 Other long term (current) drug therapy: Secondary | ICD-10-CM | POA: Insufficient documentation

## 2023-11-17 DIAGNOSIS — Z801 Family history of malignant neoplasm of trachea, bronchus and lung: Secondary | ICD-10-CM | POA: Insufficient documentation

## 2023-11-17 DIAGNOSIS — I129 Hypertensive chronic kidney disease with stage 1 through stage 4 chronic kidney disease, or unspecified chronic kidney disease: Secondary | ICD-10-CM | POA: Insufficient documentation

## 2023-11-17 DIAGNOSIS — Z881 Allergy status to other antibiotic agents status: Secondary | ICD-10-CM | POA: Insufficient documentation

## 2023-11-17 DIAGNOSIS — Z803 Family history of malignant neoplasm of breast: Secondary | ICD-10-CM | POA: Insufficient documentation

## 2023-11-17 DIAGNOSIS — I700 Atherosclerosis of aorta: Secondary | ICD-10-CM | POA: Diagnosis not present

## 2023-11-17 DIAGNOSIS — K5750 Diverticulosis of both small and large intestine without perforation or abscess without bleeding: Secondary | ICD-10-CM | POA: Insufficient documentation

## 2023-11-17 DIAGNOSIS — Z79620 Long term (current) use of immunosuppressive biologic: Secondary | ICD-10-CM | POA: Diagnosis not present

## 2023-11-17 DIAGNOSIS — K439 Ventral hernia without obstruction or gangrene: Secondary | ICD-10-CM | POA: Diagnosis not present

## 2023-11-17 DIAGNOSIS — Z9089 Acquired absence of other organs: Secondary | ICD-10-CM | POA: Insufficient documentation

## 2023-11-17 DIAGNOSIS — Z86018 Personal history of other benign neoplasm: Secondary | ICD-10-CM | POA: Diagnosis not present

## 2023-11-17 DIAGNOSIS — Z809 Family history of malignant neoplasm, unspecified: Secondary | ICD-10-CM | POA: Insufficient documentation

## 2023-11-17 LAB — COMPREHENSIVE METABOLIC PANEL WITH GFR
ALT: 15 U/L (ref 0–44)
AST: 24 U/L (ref 15–41)
Albumin: 4 g/dL (ref 3.5–5.0)
Alkaline Phosphatase: 50 U/L (ref 38–126)
Anion gap: 13 (ref 5–15)
BUN: 35 mg/dL — ABNORMAL HIGH (ref 8–23)
CO2: 18 mmol/L — ABNORMAL LOW (ref 22–32)
Calcium: 9.7 mg/dL (ref 8.9–10.3)
Chloride: 108 mmol/L (ref 98–111)
Creatinine, Ser: 0.96 mg/dL (ref 0.44–1.00)
GFR, Estimated: >60 mL/min (ref >60)
Glucose, Bld: 157 mg/dL — ABNORMAL HIGH (ref 70–99)
Potassium: 4 mmol/L (ref 3.5–5.1)
Sodium: 139 mmol/L (ref 135–145)
Total Bilirubin: 0.5 mg/dL (ref 0.0–1.2)
Total Protein: 7.5 g/dL (ref 6.5–8.1)

## 2023-11-17 LAB — CBC WITH DIFFERENTIAL/PLATELET
Abs Immature Granulocytes: 0.06 K/uL (ref 0.00–0.07)
Basophils Absolute: 0 K/uL (ref 0.0–0.1)
Basophils Relative: 0 %
Eosinophils Absolute: 0.3 K/uL (ref 0.0–0.5)
Eosinophils Relative: 3 %
HCT: 36.8 % (ref 36.0–46.0)
Hemoglobin: 11.7 g/dL — ABNORMAL LOW (ref 12.0–15.0)
Immature Granulocytes: 1 %
Lymphocytes Relative: 24 %
Lymphs Abs: 1.9 K/uL (ref 0.7–4.0)
MCH: 30.6 pg (ref 26.0–34.0)
MCHC: 31.8 g/dL (ref 30.0–36.0)
MCV: 96.3 fL (ref 80.0–100.0)
Monocytes Absolute: 0.7 K/uL (ref 0.1–1.0)
Monocytes Relative: 9 %
Neutro Abs: 5 K/uL (ref 1.7–7.7)
Neutrophils Relative %: 63 %
Platelets: 251 K/uL (ref 150–400)
RBC: 3.82 MIL/uL — ABNORMAL LOW (ref 3.87–5.11)
RDW: 13.6 % (ref 11.5–15.5)
WBC: 7.9 K/uL (ref 4.0–10.5)
nRBC: 0 % (ref 0.0–0.2)

## 2023-11-17 LAB — TSH: TSH: 1.153 u[IU]/mL (ref 0.350–4.500)

## 2023-11-17 MED ORDER — SODIUM CHLORIDE 0.9 % IV SOLN
200.0000 mg | Freq: Once | INTRAVENOUS | Status: AC
  Administered 2023-11-17: 200 mg via INTRAVENOUS
  Filled 2023-11-17: qty 8

## 2023-11-17 MED ORDER — SODIUM CHLORIDE 0.9 % IV SOLN
Freq: Once | INTRAVENOUS | Status: AC
  Filled 2023-11-17: qty 250

## 2023-11-17 MED ORDER — GABAPENTIN 100 MG PO CAPS: 100.0000 mg | ORAL_CAPSULE | Freq: Every day | ORAL | 1 refills | Status: AC

## 2023-11-17 NOTE — Assessment & Plan Note (Signed)
 Continue  gabapentin  100mg  daily. Refilled

## 2023-11-17 NOTE — Assessment & Plan Note (Signed)
 Treatment plan as listed above.

## 2023-11-17 NOTE — Patient Instructions (Signed)
 [facility] - A DEPT OF [facility]  Discharge Instructions: Thank you for choosing [facility] to provide your oncology and hematology care.  If you have a lab appointment with the Cancer Center, please go directly to the Cancer Center and check in at the registration area.  Wear comfortable clothing and clothing appropriate for easy access to any Portacath or PICC line.   We strive to give you quality time with your provider. You may need to reschedule your appointment if you arrive late (15 or more minutes).  Arriving late affects you and other patients whose appointments are after yours.  Also, if you miss three or more appointments without notifying the office, you may be dismissed from the clinic at the provider's discretion.      For prescription refill requests, have your pharmacy contact our office and allow 72 hours for refills to be completed.    Today you received the following chemotherapy and/or immunotherapy agents Rande Lawman      To help prevent nausea and vomiting after your treatment, we encourage you to take your nausea medication as directed.  BELOW ARE SYMPTOMS THAT SHOULD BE REPORTED IMMEDIATELY: *FEVER GREATER THAN 100.4 F (38 C) OR HIGHER *CHILLS OR SWEATING *NAUSEA AND VOMITING THAT IS NOT CONTROLLED WITH YOUR NAUSEA MEDICATION *UNUSUAL SHORTNESS OF BREATH *UNUSUAL BRUISING OR BLEEDING *URINARY PROBLEMS (pain or burning when urinating, or frequent urination) *BOWEL PROBLEMS (unusual diarrhea, constipation, pain near the anus) TENDERNESS IN MOUTH AND THROAT WITH OR WITHOUT PRESENCE OF ULCERS (sore throat, sores in mouth, or a toothache) UNUSUAL RASH, SWELLING OR PAIN  UNUSUAL VAGINAL DISCHARGE OR ITCHING   Items with * indicate a potential emergency and should be followed up as soon as possible or go to the Emergency Department if any problems should occur.  Please show the CHEMOTHERAPY ALERT CARD or IMMUNOTHERAPY  ALERT CARD at check-in to the Emergency Department and triage nurse.  Should you have questions after your visit or need to cancel or reschedule your appointment, please contact [facility] - A DEPT OF [facility]  [phone] and follow the prompts.  Office hours are 8:00 a.m. to 4:30 p.m. Monday - Friday. Please note that voicemails left after 4:00 p.m. may not be returned until the following business day.  We are closed weekends and major holidays. You have access to a nurse at all times for urgent questions. Please call the main number to the clinic [phone] and follow the prompts.  For any non-urgent questions, you may also contact your provider using MyChart. We now offer e-Visits for anyone 48 and older to request care online for non-urgent symptoms. For details visit [email or web address].   Also download the MyChart app! Go to the app store, search "MyChart", open the app, select [facility] [place], and log in with your MyChart username and password.

## 2023-11-17 NOTE — Assessment & Plan Note (Signed)
 Encourage oral hydration and avoid nephrotoxins.  Stable creatinine

## 2023-11-17 NOTE — Assessment & Plan Note (Addendum)
 Stable hemoglobin

## 2023-11-17 NOTE — Assessment & Plan Note (Signed)
 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 s/p carboplatin  and Taxol  for 5 cycles, on Keytruda  maintenance.  Labs are reviewed and discussed with patient.  Proceed with Keytruda   May 2025 CT showed NED/stable disease,stable adrenal nodule

## 2023-11-17 NOTE — Progress Notes (Signed)
 Hematology/Oncology Progress note [phone] [phone]      CHIEF COMPLAINTS/REASON FOR VISIT:  Follow up for endometrial cancer  ASSESSMENT & PLAN:   Cancer Staging  Endometrial adenocarcinoma [facility]) Staging form: Corpus Uteri - Carcinoma and Carcinosarcoma, AJCC 8th Edition - Clinical stage from 04/10/2021: FIGO Stage III, calculated as Stage Unknown (cT3, cNX, cM0) - Signed by Babara Call, MD on 08/24/2021   Endometrial adenocarcinoma (HCC) FIGO grade 3 poorly differentiated endometrial adenocarcinoma. s/p carboplatin  and Taxol  for 5 cycles, on Keytruda  maintenance.  Labs are reviewed and discussed with patient.  Proceed with Keytruda   May 2025 CT showed NED/stable disease,stable adrenal nodule  CKD (chronic kidney disease) stage 3, GFR 30-59 ml/min (HCC) Encourage oral hydration and avoid nephrotoxins.  Stable creatinine  Anemia in chronic kidney disease (CKD) Stable hemoglobin  Adrenal mass (HCC) lipid poor hyperfunctioning adenoma vs mets.  FDG avid on PET scan.  Difficult biopsy due to patient's body habitus and deep position of the mass. Resolved on subsequent PET scan after treatments, suggesting that this lesion might be a metastatic lesion. CT showed stable lesion  Continue monitor   Encounter for antineoplastic immunotherapy Treatment plan as listed above.   Neuropathy Continue  gabapentin  100mg  daily. Refilled    Orders Placed This Encounter  Procedures   CT CHEST ABDOMEN PELVIS WO CONTRAST    Standing Status:   Future    Expected Date:   12/01/2023    Expiration Date:   11/16/2024    Preferred imaging location?:   [facility]    If indicated for the ordered procedure, I authorize the administration of oral contrast media per Radiology protocol:   Yes    Does the patient have a contrast media/X-ray dye allergy?:   No   CBC with Differential    Standing Status:   Future    Expected Date:   12/07/2023    Expiration Date:    12/06/2024   Comprehensive metabolic panel    Standing Status:   Future    Expected Date:   12/07/2023    Expiration Date:   12/06/2024   TSH    Standing Status:   Future    Expected Date:   12/07/2023    Expiration Date:   12/06/2024    Return of visit:  3 weeks lab MD Keytruda   Call Babara, MD, [name] [facility] Hematology Oncology 11/17/2023     HISTORY OF PRESENTING ILLNESS:   Valerie Case is a  73 y.o.  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 Oncology History  Endometrial adenocarcinoma (HCC)  12/12/2020 Imaging   12/12/2020, CT abdomen pelvis with contrast showed no radiographic evidence of urinary tract neoplasm, calculi, hydronephrosis.  2.6 cm nonspecific left adrenal mass. 02/12/2021 CT hematuria work-up showed small uterine fibroids, no findings to explain hematuria.Hepatomegaly, diverticulosis without evidence of diverticulitis, Coronary artery disease   04/01/2021 -  Hospital Admission   04/01/2021 - 04/03/2021, patient was hospitalized due to symptomatic anemia, hemoglobin 6.9, heavy postmenopausal bleeding with passing large clots.  She also presented with increased creatinine level to 2.58 compared to her baseline level of 0.9 in November 2022.  Patient received IV iron  infusion and 2 units of PRBC during the hospital stay..  04/02/2021, iron  panel showed iron  saturation 37, ferritin 37, TIBC 333-the studies were done after patient received blood transfusion.  At discharge, hemoglobin was 7.7.   04/01/2021 Initial Diagnosis   04/01/2020, endometrial biopsy showed poorly differentiated  endometrial adenocarcinoma. Omniseq NGS showed TMB 48.4 mt/mb [high], MSI High, PD-L1-TPS <1%, PIK3CA H1047Q, Negative for BRAF, HER2, NTRK1 RET.     04/10/2021 Cancer Staging   Staging form: Corpus Uteri - Carcinoma and Carcinosarcoma, AJCC 8th Edition - Clinical stage from 04/10/2021: FIGO Stage III, calculated as Stage Unknown (cT3, cNX,  cM0) - Signed by Babara Call, MD on 08/24/2021 Stage prefix: Initial diagnosis   04/15/2021 Imaging   PET showed 1. Large hypermetabolic endometrial mass consistent with known endometrial cancer. Suspect direct invasion/involvement of the right adnexa. Right pelvic sidewall hypermetabolic adenopathy.2. 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 3. No findings for metastatic disease involving the chest or bonystructures.     04/24/2021 - 05/29/2021 Radiation Therapy   status post radiation to pelvis   08/05/2021 Imaging   MRI abdomen w wo contrast 1. Stable solid enhancing 2.7 cm left adrenal lesion, which was hypermetabolic on prior PET/CTs but is unchanged in size dating back to December 12, 2020. 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 However, unfortunately metastatic disease can not be entirely excluded and remains a pertinent differential consideration. Comparison with more remote prior imaging would be the most valuable tool in the assessment of this lesion, as demonstrating long-term stability would indicate this to be a benign lesion. However, if no prior imaging can be made available, would consider follow-up adrenal protocol CT with and without intravenous contrast material in 3 months as this would allow for further assessment of stability as well as enhancement and washout characteristics of the lesion potentially allowing for better characterization versus direct tissue sampling.2. Hepatomegaly and hepatic steatosis.3. Colonic diverticulosis without findings of acute diverticulitis    08/16/2021 - 09/27/2021 Chemotherapy   UTERINE Carboplatin  AUC 5 / Paclitaxel  q21d x 3      10/03/2021 Imaging   PET 1. No residual hypermetabolism  in the uterus suggesting an excellent response to treatment. No findings for metastatic disease. 2. Resolution of hypermetabolism in the left adrenal gland lesion suggesting this was metastatic disease. 3. Diffuse marrow hypermetabolism likely due to rebound from chemotherapy or marrow stimulating drugs   10/18/2021 - 11/08/2021 Chemotherapy   AUC 5 / Paclitaxel / Keytruda  Q21d  x 2 cycles   11/29/2021 -  Chemotherapy   Patient is on Treatment Plan : UTERINE Pembrolizumab  (200) q21d     04/30/2022 Imaging   CT chest abdomen pelvis w contrast  1. Similar to minimal decrease in size of a left adrenal nodule which was felt suspicious for metastatic disease on 10/03/2021 PET. 2. No evidence of new or progressive disease. 3. Coronary artery atherosclerosis. Aortic Atherosclerosis (ICD10-I70.0). 4. Incidental findings, including: Hepatomegaly. Possible constipation.   08/28/2022 Imaging   CT chest abdomen pelvis w contrast showed 1. Similar appearance of indeterminate left adrenal nodule. 2. Otherwise, no evidence of metastatic disease. 3. Hepatomegaly 4.  Possible constipation. 5. Coronary artery atherosclerosis. Aortic Atherosclerosis (ICD10-I70.0).   12/22/2022 Imaging   CT chest abdomen pelvis w contrast showed 1. Stable indeterminate left adrenal nodule. 2. Otherwise, no noncontrast enhanced CT evidence of metastatic disease. 3. Colonic diverticulosis without findings of acute diverticulitis. 4.  Aortic Atherosclerosis (ICD10-I70.0).     04/17/2023 Imaging   CT chest abdomen pelvis wo contrast  1. Similar size of the indeterminate left adrenal nodule which  is unchanged back to 06/10/2021. Felt unlikely to represent metastatic disease. Recommend attention on follow-up. 2. Otherwise, no evidence of metastatic disease. 3. Aortic valvular calcifications. Consider echocardiography to evaluate for valvular dysfunction. 4.  Aortic Atherosclerosis (ICD10-I70.0).       INTERVAL  HISTORY Valerie Case is a 73 y.o. female who has above history reviewed by me today presents for follow up visit for anemia and endometrial cancer Overall she tolerates treatment Today Patient denies SOB,  nausea vomiting diarrhea. Off lasix currently.    Review of Systems  Constitutional:  Positive for fatigue. Negative for chills and fever.  HENT:   Negative for hearing loss and voice change.   Eyes:  Negative for eye problems.  Respiratory:  Negative for chest tightness and cough.   Cardiovascular:  Negative for chest pain.  Gastrointestinal:  Negative for abdominal distention, abdominal pain and blood in stool.  Endocrine: Negative for hot flashes.  Genitourinary:  Negative for difficulty urinating, frequency and vaginal bleeding.   Musculoskeletal:  Positive for arthralgias.  Skin:  Negative for itching and rash.  Neurological:  Negative for extremity weakness and headaches.  Hematological:  Negative for adenopathy.  Psychiatric/Behavioral:  Negative for confusion.     MEDICAL HISTORY:  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   Mixed hyperlipidemia    Peripheral polyneuropathy    Sleep apnea     SURGICAL HISTORY: Past Surgical History:  Procedure Laterality Date   APPENDECTOMY     BIOPSY  03/17/2023   Procedure: BIOPSY;  Surgeon: Onita Elspeth Sharper, DO;  Location: [facility] ENDOSCOPY;  Service: Gastroenterology;;   CESAREAN SECTION  01/23/1979   COLONOSCOPY     COLONOSCOPY N/A 03/17/2023   Procedure:  COLONOSCOPY;  Surgeon: Onita Elspeth Sharper, DO;  Location: [facility] ENDOSCOPY;  Service: Gastroenterology;  Laterality: N/A;   COLONOSCOPY WITH PROPOFOL  N/A 03/13/2023   Procedure: COLONOSCOPY WITH PROPOFOL ;  Surgeon: Onita Elspeth Sharper, DO;  Location: [facility] ENDOSCOPY;  Service: Gastroenterology;  Laterality: N/A;   ESOPHAGOGASTRODUODENOSCOPY     ESOPHAGOGASTRODUODENOSCOPY N/A 03/17/2023   Procedure: ESOPHAGOGASTRODUODENOSCOPY (EGD);  Surgeon: Onita Elspeth Sharper, DO;  Location: [facility] ENDOSCOPY;  Service: Gastroenterology;  Laterality: N/A;   ESOPHAGOGASTRODUODENOSCOPY (EGD) WITH PROPOFOL  N/A 03/13/2023   Procedure: ESOPHAGOGASTRODUODENOSCOPY (EGD) WITH PROPOFOL ;  Surgeon: Onita Elspeth Sharper, DO;  Location: [facility] ENDOSCOPY;  Service: Gastroenterology;  Laterality: N/A;   IR IMAGING GUIDED PORT INSERTION  08/09/2021   TONSILLECTOMY  03/17/1955    SOCIAL HISTORY: Social History   Socioeconomic History   Marital status: Divorced    Spouse name: Not on file   Number of children: Not on file   Years of education: Not on file   Highest education level: Not on file  Occupational History   Not on file  Tobacco Use   Smoking status: Never   Smokeless tobacco: Never  Vaping Use   Vaping status: Never Used  Substance and Sexual Activity   Alcohol use:  Not Currently   Drug use: Never   Sexual activity: Not Currently  Other Topics Concern   Not on file  Social History Narrative      Social Drivers of Health   Financial Resource Strain: Low Risk  (05/19/2023)   Received from [facility]   Overall Financial Resource Strain (CARDIA)    Difficulty of Paying Living Expenses: Not hard at all  Food Insecurity: No Food Insecurity (05/19/2023)   Received from [facility]   Hunger Vital Sign    Within the past 12 months, you worried that your food would run out before you got the money to buy more.: Never true    Within the past 12 months, the food you  bought just didn't last and you didn't have money to get more.: Never true  Transportation Needs: No Transportation Needs (05/19/2023)   Received from [facility] - Transportation    In the past 12 months, has lack of transportation kept you from medical appointments or from getting medications?: No    Lack of Transportation (Non-Medical): No  Physical Activity: Inactive (09/27/2021)   Exercise Vital Sign    Days of Exercise per Week: 0 days    Minutes of Exercise per Session: 0 min  Stress: Stress Concern Present (09/27/2021)   [employer] of Occupational Health - Occupational Stress Questionnaire    Feeling of Stress : Rather much  Social Connections: Moderately Integrated (09/27/2021)   Social Connection and Isolation Panel    Frequency of Communication with Friends and Family: Three times a week    Frequency of Social Gatherings with Friends and Family: Three times a week    Attends Religious Services: 1 to 4 times per year    Active Member of Clubs or Organizations: No    Attends Banker Meetings: 1 to 4 times per year    Marital Status: Divorced  Catering manager Violence: Not At Risk (09/27/2021)   Humiliation, Afraid, Rape, and Kick questionnaire    Fear of Current or Ex-Partner: No    Emotionally Abused: No    Physically Abused: No    Sexually Abused: No    FAMILY HISTORY: Family History  Problem Relation Age of Onset   Breast cancer Mother 28   Cancer Mother    Cancer Father    Lung cancer Father    Breast cancer Cousin        dx 53s maternal    ALLERGIES:  is allergic to doxycycline , omeprazole, and aspirin.  MEDICATIONS:  Current Outpatient Medications  Medication Sig Dispense Refill   acetaminophen  (TYLENOL ) 650 MG CR tablet Take 1,300 mg by mouth every 8 (eight) hours as needed for pain.     albuterol  (VENTOLIN  HFA) 108 (90 Base) MCG/ACT inhaler Inhale 2 puffs into the lungs every 6 (six) hours as needed for wheezing  or shortness of breath.     azelastine  (ASTELIN ) 0.1 % nasal spray Place 2 sprays into both nostrils daily. Use in each nostril as directed 30 mL 0   Cranberry-Vitamin C-Probiotic (AZO CRANBERRY PO) Take by mouth.     docusate sodium  (COLACE) 100 MG capsule Take 1 capsule (100 mg total) by mouth 2 (two) times daily. 60 capsule 1   empagliflozin (JARDIANCE) 25 MG TABS tablet Take 25 mg by mouth daily.     ferrous sulfate 325 (65 FE) MG EC tablet Take 325 mg by mouth daily with breakfast.  FLUoxetine  (PROZAC ) 10 MG capsule Take 10 mg by mouth daily.     fluticasone  (FLONASE ) 50 MCG/ACT nasal spray Place 1 spray into both nostrils daily as needed for allergies or rhinitis.     furosemide (LASIX) 20 MG tablet Take 20 mg by mouth daily.     hydrochlorothiazide  (HYDRODIURIL ) 12.5 MG tablet Take 12.5 mg by mouth daily.     Insulin  Asp Prot & Asp FlexPen (NOVOLOG  70/30 MIX) (70-30) 100 UNIT/ML FlexPen Inject into the skin.     lidocaine -prilocaine  (EMLA ) cream Apply small amount to port and cover with saran wrap 1-2 hours prior to port access -daily PRN 30 g 3   lisinopril  (ZESTRIL ) 40 MG tablet Take 40 mg by mouth at bedtime.     loratadine (CLARITIN) 10 MG tablet Take by mouth.     metFORMIN  (GLUCOPHAGE -XR) 500 MG 24 hr tablet Take 500 mg by mouth at bedtime.     metoprolol  succinate (TOPROL -XL) 25 MG 24 hr tablet Take 25 mg by mouth daily.     NOVOLIN 70/30 FLEXPEN (70-30) 100 UNIT/ML KwikPen 50-110 Units See admin instructions. 50 units in the morning, 110 units at bedtime     Omega-3 Fatty Acids (FISH OIL) 300 MG CAPS Take by mouth.     omeprazole (PRILOSEC) 20 MG capsule Take 20 mg by mouth daily.     OZEMPIC , 1 MG/DOSE, 4 MG/3ML SOPN Inject 1 mg into the skin every Tuesday.     rosuvastatin  (CRESTOR ) 10 MG tablet Take 10 mg by mouth daily.     traMADol  (ULTRAM ) 50 MG tablet Take 2 tablets (100 mg total) by mouth every 6 (six) hours as needed. 90 tablet 0   gabapentin  (NEURONTIN ) 100 MG  capsule Take 1 capsule (100 mg total) by mouth daily. 90 capsule 1   hydrocortisone  2.5 % cream Apply topically 3 (three) times daily. 30 g 2   LORazepam  (ATIVAN ) 0.5 MG tablet Take 1 tablet (0.5 mg total) by mouth every 8 (eight) hours as needed for anxiety (nausea vomiting). 30 tablet 0   No current facility-administered medications for this visit.   Facility-Administered Medications Ordered in Other Visits  Medication Dose Route Frequency Provider Last Rate Last Admin   heparin  lock flush 100 UNIT/ML injection              PHYSICAL EXAMINATION: ECOG PERFORMANCE STATUS: 1 - Symptomatic but completely ambulatory  Physical Exam Constitutional:      General: She is not in acute distress.    Appearance: She is obese.  HENT:     Head: Normocephalic and atraumatic.  Eyes:     General: No scleral icterus. Cardiovascular:     Rate and Rhythm: Normal rate and regular rhythm.     Heart sounds: Murmur heard.  Pulmonary:     Effort: Pulmonary effort is normal. No respiratory distress.  Abdominal:     General: Bowel sounds are normal. There is no distension.     Palpations: Abdomen is soft.  Musculoskeletal:        General: No deformity. Normal range of motion.     Cervical back: Normal range of motion.     Right lower leg: Edema present.     Left lower leg: Edema present.     Comments:    Skin:    General: Skin is warm and dry.     Findings: No rash.     Comments: Chronic venous sufficiency skin changes   Neurological:     Mental Status:  She is alert and oriented to person, place, and time. Mental status is at baseline.  Psychiatric:        Mood and Affect: Mood normal.    LABORATORY DATA:  I have reviewed the data as listed    Latest Ref Rng & Units 11/17/2023    8:22 AM 10/26/2023    8:10 AM 10/05/2023    8:48 AM  CBC  WBC 4.0 - 10.5 K/uL 7.9  7.5  7.9   Hemoglobin 12.0 - 15.0 g/dL 88.2  88.3  88.2   Hematocrit 36.0 - 46.0 % 36.8  36.4  35.8   Platelets 150 - 400 K/uL  251  252  293       Latest Ref Rng & Units 11/17/2023    8:22 AM 10/26/2023    8:10 AM 10/05/2023    8:48 AM  CMP  Glucose 70 - 99 mg/dL 842  789  830   BUN 8 - 23 mg/dL 35  32  34   Creatinine 0.44 - 1.00 mg/dL 9.03  8.89  8.72   Sodium 135 - 145 mmol/L 139  138  138   Potassium 3.5 - 5.1 mmol/L 4.0  4.3  4.3   Chloride 98 - 111 mmol/L 108  108  107   CO2 22 - 32 mmol/L 18  16  20    Calcium  8.9 - 10.3 mg/dL 9.7  89.8  9.9   Total Protein 6.5 - 8.1 g/dL 7.5  7.0  7.3   Total Bilirubin 0.0 - 1.2 mg/dL 0.5  0.3  0.4   Alkaline Phos 38 - 126 U/L 50  50  48   AST 15 - 41 U/L 24  26  21    ALT 0 - 44 U/L 15  15  16        RADIOGRAPHIC STUDIES: I have personally reviewed the radiological images as listed and agreed with the findings in the report. No results found.

## 2023-11-17 NOTE — Progress Notes (Signed)
 Patient here for follow up. Pt requesting Gabapentin  refill.

## 2023-11-17 NOTE — Assessment & Plan Note (Signed)
 lipid poor hyperfunctioning adenoma vs mets.  FDG avid on PET scan.  Difficult biopsy due to patient's body habitus and deep position of the mass. Resolved on subsequent PET scan after treatments, suggesting that this lesion might be a metastatic lesion. CT showed stable lesion  Continue monitor

## 2023-11-18 ENCOUNTER — Other Ambulatory Visit: Payer: Self-pay

## 2023-11-23 ENCOUNTER — Ambulatory Visit
Admission: RE | Admit: 2023-11-23 | Discharge: 2023-11-23 | Disposition: A | Discharge status: Home / Self Care | Source: Ambulatory Visit | Attending: Oncology | Admitting: Oncology

## 2023-11-23 DIAGNOSIS — C541 Malignant neoplasm of endometrium: Secondary | ICD-10-CM | POA: Insufficient documentation

## 2023-12-03 ENCOUNTER — Encounter: Payer: Self-pay | Admitting: Oncology

## 2023-12-07 ENCOUNTER — Inpatient Hospital Stay

## 2023-12-07 ENCOUNTER — Encounter: Payer: Self-pay | Admitting: Oncology

## 2023-12-07 ENCOUNTER — Inpatient Hospital Stay (HOSPITAL_BASED_OUTPATIENT_CLINIC_OR_DEPARTMENT_OTHER): Admitting: Oncology

## 2023-12-07 VITALS — BP 121/81 | HR 100 | Temp 98.6°F | Resp 17 | Wt 280.1 lb

## 2023-12-07 DIAGNOSIS — C541 Malignant neoplasm of endometrium: Secondary | ICD-10-CM

## 2023-12-07 DIAGNOSIS — D631 Anemia in chronic kidney disease: Secondary | ICD-10-CM

## 2023-12-07 DIAGNOSIS — G629 Polyneuropathy, unspecified: Secondary | ICD-10-CM

## 2023-12-07 DIAGNOSIS — E278 Other specified disorders of adrenal gland: Secondary | ICD-10-CM

## 2023-12-07 DIAGNOSIS — E041 Nontoxic single thyroid nodule: Secondary | ICD-10-CM

## 2023-12-07 DIAGNOSIS — N1831 Chronic kidney disease, stage 3a: Secondary | ICD-10-CM

## 2023-12-07 DIAGNOSIS — N189 Chronic kidney disease, unspecified: Secondary | ICD-10-CM | POA: Diagnosis not present

## 2023-12-07 DIAGNOSIS — Z95828 Presence of other vascular implants and grafts: Secondary | ICD-10-CM

## 2023-12-07 DIAGNOSIS — Z5112 Encounter for antineoplastic immunotherapy: Secondary | ICD-10-CM

## 2023-12-07 LAB — CBC WITH DIFFERENTIAL/PLATELET
Abs Immature Granulocytes: 0.05 K/uL (ref 0.00–0.07)
Basophils Absolute: 0.1 K/uL (ref 0.0–0.1)
Basophils Relative: 1 %
Eosinophils Absolute: 0.2 K/uL (ref 0.0–0.5)
Eosinophils Relative: 3 %
HCT: 36.5 % (ref 36.0–46.0)
Hemoglobin: 11.8 g/dL — ABNORMAL LOW (ref 12.0–15.0)
Immature Granulocytes: 1 %
Lymphocytes Relative: 23 %
Lymphs Abs: 1.8 K/uL (ref 0.7–4.0)
MCH: 30.8 pg (ref 26.0–34.0)
MCHC: 32.3 g/dL (ref 30.0–36.0)
MCV: 95.3 fL (ref 80.0–100.0)
Monocytes Absolute: 0.8 K/uL (ref 0.1–1.0)
Monocytes Relative: 11 %
Neutro Abs: 4.7 K/uL (ref 1.7–7.7)
Neutrophils Relative %: 61 %
Platelets: 246 K/uL (ref 150–400)
RBC: 3.83 MIL/uL — ABNORMAL LOW (ref 3.87–5.11)
RDW: 13.2 % (ref 11.5–15.5)
WBC: 7.6 K/uL (ref 4.0–10.5)
nRBC: 0 % (ref 0.0–0.2)

## 2023-12-07 LAB — COMPREHENSIVE METABOLIC PANEL WITH GFR
ALT: 14 U/L (ref 0–44)
AST: 22 U/L (ref 15–41)
Albumin: 3.9 g/dL (ref 3.5–5.0)
Alkaline Phosphatase: 52 U/L (ref 38–126)
Anion gap: 11 (ref 5–15)
BUN: 32 mg/dL — ABNORMAL HIGH (ref 8–23)
CO2: 21 mmol/L — ABNORMAL LOW (ref 22–32)
Calcium: 9.6 mg/dL (ref 8.9–10.3)
Chloride: 107 mmol/L (ref 98–111)
Creatinine, Ser: 0.9 mg/dL (ref 0.44–1.00)
GFR, Estimated: >60 mL/min (ref >60)
Glucose, Bld: 145 mg/dL — ABNORMAL HIGH (ref 70–99)
Potassium: 4 mmol/L (ref 3.5–5.1)
Sodium: 139 mmol/L (ref 135–145)
Total Bilirubin: 0.3 mg/dL (ref 0.0–1.2)
Total Protein: 7.1 g/dL (ref 6.5–8.1)

## 2023-12-07 LAB — TSH: TSH: 1.262 u[IU]/mL (ref 0.350–4.500)

## 2023-12-07 MED ORDER — SODIUM CHLORIDE 0.9 % IV SOLN
Freq: Once | INTRAVENOUS | Status: AC
  Filled 2023-12-07: qty 250

## 2023-12-07 MED ORDER — SODIUM CHLORIDE 0.9 % IV SOLN
200.0000 mg | Freq: Once | INTRAVENOUS | Status: AC
  Administered 2023-12-07: 200 mg via INTRAVENOUS
  Filled 2023-12-07: qty 8

## 2023-12-07 NOTE — Assessment & Plan Note (Signed)
 Treatment plan as listed above.

## 2023-12-07 NOTE — Assessment & Plan Note (Signed)
 Stable hemoglobin

## 2023-12-07 NOTE — Assessment & Plan Note (Signed)
 Continue  gabapentin  100mg  daily. Refilled

## 2023-12-07 NOTE — Assessment & Plan Note (Signed)
Port flush Q6-8 weeks

## 2023-12-07 NOTE — Assessment & Plan Note (Addendum)
 Encourage oral hydration and avoid nephrotoxins.  improved creatinine

## 2023-12-07 NOTE — Assessment & Plan Note (Addendum)
 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 s/p carboplatin  and Taxol  for 5 cycles, keytruda  added at cycle 4 (10/18/21), on Keytruda  maintenance.  Labs are reviewed and discussed with patient.  Proceed with Keytruda   Sept 2025 CT showed NED/stable disease,stable adrenal nodule She is finishing 2 years of immunotherapy maintenance.  Repeat CT in 6 months. She is overdue follow up with Gynonc, recommend pt to make appt.

## 2023-12-07 NOTE — Progress Notes (Signed)
 Hematology/Oncology Progress note [phone] [phone]      CHIEF COMPLAINTS/REASON FOR VISIT:  Follow up for endometrial cancer  ASSESSMENT & PLAN:   Cancer Staging  Endometrial adenocarcinoma [facility]) Staging form: Corpus Uteri - Carcinoma and Carcinosarcoma, AJCC 8th Edition - Clinical stage from 04/10/2021: FIGO Stage III, calculated as Stage Unknown (cT3, cNX, cM0) - Signed by Babara Call, MD on 08/24/2021   Endometrial adenocarcinoma (HCC) FIGO grade 3 poorly differentiated endometrial adenocarcinoma. s/p carboplatin  and Taxol  for 5 cycles, keytruda  added at cycle 4 (10/18/21), on Keytruda  maintenance.  Labs are reviewed and discussed with patient.  Proceed with Keytruda   Sept 2025 CT showed NED/stable disease,stable adrenal nodule She is finishing 2 years of immunotherapy maintenance.  Repeat CT in 6 months. She is overdue follow up with Gynonc, recommend pt to make appt.   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  FDG avid on PET scan.  Difficult biopsy due to patient's body habitus and deep position of the mass. Resolved on subsequent PET scan after treatments, suggesting that this lesion might be a metastatic lesion. CT showed stable lesion  Continue monitor   Anemia in chronic kidney disease (CKD) Stable hemoglobin  CKD (chronic kidney disease) stage 3, GFR 30-59 ml/min (HCC) Encourage oral hydration and avoid nephrotoxins.  improved creatinine  Encounter for antineoplastic immunotherapy Treatment plan as listed above.   Neuropathy Continue  gabapentin  100mg  daily. Refilled   Port-A-Cath in place Port flush Q6-8 weeks   Orders Placed This Encounter  Procedures   CT CHEST ABDOMEN PELVIS WO CONTRAST    Standing Status:   Future    Expected Date:   06/05/2024    Expiration Date:   12/06/2024    Preferred imaging location?:   [facility]    If indicated for the ordered procedure, I authorize the administration  of oral contrast media per Radiology protocol:   Yes    Does the patient have a contrast media/X-ray dye allergy?:   No   CBC with Differential (Cancer Center Only)    Standing Status:   Future    Expected Date:   06/05/2024    Expiration Date:   09/03/2024   CMP (Cancer Center only)    Standing Status:   Future    Expected Date:   06/05/2024    Expiration Date:   09/03/2024   TSH    Standing Status:   Future    Expected Date:   06/05/2024    Expiration Date:   09/03/2024   Follow up in  6 months  Call Babara, MD, [name] [facility] Hematology Oncology 12/07/2023     HISTORY OF PRESENTING ILLNESS:   Valerie Case is a  73 y.o.  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 Oncology History  Endometrial adenocarcinoma (HCC)  12/12/2020 Imaging   12/12/2020, CT abdomen pelvis with contrast showed no radiographic evidence of urinary tract neoplasm, calculi, hydronephrosis.  2.6 cm nonspecific left adrenal mass. 02/12/2021 CT hematuria work-up showed small uterine fibroids, no findings to explain hematuria.Hepatomegaly, diverticulosis without evidence of diverticulitis, Coronary artery disease   04/01/2021 -  Hospital Admission   04/01/2021 - 04/03/2021, patient was hospitalized due to symptomatic anemia, hemoglobin 6.9, heavy postmenopausal bleeding with passing large clots.  She also presented with increased creatinine level to 2.58 compared to her baseline level of 0.9 in November 2022.  Patient received IV iron  infusion and 2 units of PRBC during the  hospital stay..  04/02/2021, iron  panel showed iron  saturation 37, ferritin 37, TIBC 333-the studies were done after patient received blood transfusion.  At discharge, hemoglobin was 7.7.   04/01/2021 Initial Diagnosis   04/01/2020, endometrial biopsy showed poorly differentiated endometrial adenocarcinoma. Omniseq NGS showed TMB 48.4 mt/mb [high], MSI High, PD-L1-TPS <1%, PIK3CA H1047Q, Negative  for BRAF, HER2, NTRK1 RET.     04/10/2021 Cancer Staging   Staging form: Corpus Uteri - Carcinoma and Carcinosarcoma, AJCC 8th Edition - Clinical stage from 04/10/2021: FIGO Stage III, calculated as Stage Unknown (cT3, cNX, cM0) - Signed by Babara Call, MD on 08/24/2021 Stage prefix: Initial diagnosis   04/15/2021 Imaging   PET showed 1. Large hypermetabolic endometrial mass consistent with known endometrial cancer. Suspect direct invasion/involvement of the right adnexa. Right pelvic sidewall hypermetabolic adenopathy.2. 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 3. No findings for metastatic disease involving the chest or bonystructures.     04/24/2021 - 05/29/2021 Radiation Therapy   status post radiation to pelvis   08/05/2021 Imaging   MRI abdomen w wo contrast 1. Stable solid enhancing 2.7 cm left adrenal lesion, which was hypermetabolic on prior PET/CTs but is unchanged in size dating back to December 12, 2020. 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 However, unfortunately metastatic disease can not be entirely excluded and remains a pertinent differential consideration. Comparison with more remote prior imaging would be the most valuable tool in the assessment of this lesion, as demonstrating long-term stability would indicate this to be a benign lesion. However, if no prior imaging can be made available, would consider follow-up adrenal protocol CT with and without intravenous contrast material in 3 months as this would allow for further assessment of stability as well as enhancement and washout characteristics of the lesion potentially allowing for better characterization versus direct tissue sampling.2. Hepatomegaly and hepatic  steatosis.3. Colonic diverticulosis without findings of acute diverticulitis    08/16/2021 - 09/27/2021 Chemotherapy   UTERINE Carboplatin  AUC 5 / Paclitaxel  q21d x 3      10/03/2021 Imaging   PET 1. No residual hypermetabolism in the uterus suggesting an excellent response to treatment. No findings for metastatic disease. 2. Resolution of hypermetabolism in the left adrenal gland lesion suggesting this was metastatic disease. 3. Diffuse marrow hypermetabolism likely due to rebound from chemotherapy or marrow stimulating drugs   10/18/2021 - 11/08/2021 Chemotherapy   AUC 5 / Paclitaxel / Keytruda  Q21d  x 2 cycles   11/29/2021 -  Chemotherapy   Patient is on Treatment Plan : UTERINE Pembrolizumab  (200) q21d     04/30/2022 Imaging   CT chest abdomen pelvis w contrast  1. Similar to minimal decrease in size of a left adrenal nodule which was felt suspicious for metastatic disease on 10/03/2021 PET. 2. No evidence of new or progressive disease. 3. Coronary artery atherosclerosis. Aortic Atherosclerosis (ICD10-I70.0). 4. Incidental findings, including: Hepatomegaly. Possible constipation.   08/28/2022 Imaging   CT chest abdomen pelvis w contrast showed 1. Similar appearance of indeterminate left adrenal nodule. 2. Otherwise, no evidence of metastatic disease. 3. Hepatomegaly 4.  Possible constipation. 5. Coronary artery atherosclerosis. Aortic Atherosclerosis (ICD10-I70.0).   12/22/2022 Imaging   CT chest abdomen pelvis w contrast showed 1. Stable indeterminate left adrenal nodule. 2. Otherwise, no noncontrast enhanced CT evidence  of metastatic disease. 3. Colonic diverticulosis without findings of acute diverticulitis. 4.  Aortic Atherosclerosis (ICD10-I70.0).     04/17/2023 Imaging   CT chest abdomen pelvis wo contrast  1. Similar size of the indeterminate left adrenal nodule which is unchanged back to 06/10/2021. Felt unlikely to represent metastatic disease. Recommend attention on  follow-up. 2. Otherwise, no evidence of metastatic disease. 3. Aortic valvular calcifications. Consider echocardiography to evaluate for valvular dysfunction. 4.  Aortic Atherosclerosis (ICD10-I70.0).       INTERVAL HISTORY ANMOL PASCHEN is a 73 y.o. female who has above history reviewed by me today presents for follow up visit for anemia and endometrial cancer Overall she tolerates treatment Today Patient denies SOB,  nausea vomiting diarrhea. Off lasix currently.    Review of Systems  Constitutional:  Positive for fatigue. Negative for chills and fever.  HENT:   Negative for hearing loss and voice change.   Eyes:  Negative for eye problems.  Respiratory:  Negative for chest tightness and cough.   Cardiovascular:  Negative for chest pain.  Gastrointestinal:  Negative for abdominal distention, abdominal pain and blood in stool.  Endocrine: Negative for hot flashes.  Genitourinary:  Negative for difficulty urinating, frequency and vaginal bleeding.   Musculoskeletal:  Positive for arthralgias.  Skin:  Negative for itching and rash.  Neurological:  Negative for extremity weakness and headaches.  Hematological:  Negative for adenopathy.  Psychiatric/Behavioral:  Negative for confusion.     MEDICAL HISTORY:  Past Medical History:  Diagnosis Date   Anemia    Aortic atherosclerosis    Arthritis    Asthma    B12 deficiency    Cancer (HCC)    Cancer of endometrium (HCC)    Coronary artery disease    Depression 04/01/2021   Diabetes mellitus without complication (HCC)    Diverticulosis    Essential hypertension 04/01/2021   Hemorrhoids    History of rectal bleeding    Hx of dysplastic nevus 07/16/2010   RLQA   Hypertension    Insulin  dependent type 2 diabetes mellitus (HCC) 04/01/2021   Mixed hyperlipidemia    Peripheral polyneuropathy    Sleep apnea     SURGICAL HISTORY: Past Surgical History:  Procedure Laterality Date   APPENDECTOMY     BIOPSY  03/17/2023    Procedure: BIOPSY;  Surgeon: Onita Elspeth Sharper, DO;  Location: [facility] ENDOSCOPY;  Service: Gastroenterology;;   CESAREAN SECTION  01/23/1979   COLONOSCOPY     COLONOSCOPY N/A 03/17/2023   Procedure: COLONOSCOPY;  Surgeon: Onita Elspeth Sharper, DO;  Location: [facility] ENDOSCOPY;  Service: Gastroenterology;  Laterality: N/A;   COLONOSCOPY WITH PROPOFOL  N/A 03/13/2023   Procedure: COLONOSCOPY WITH PROPOFOL ;  Surgeon: Onita Elspeth Sharper, DO;  Location: [facility] ENDOSCOPY;  Service: Gastroenterology;  Laterality: N/A;   ESOPHAGOGASTRODUODENOSCOPY     ESOPHAGOGASTRODUODENOSCOPY N/A 03/17/2023   Procedure: ESOPHAGOGASTRODUODENOSCOPY (EGD);  Surgeon: Onita Elspeth Sharper, DO;  Location: [facility] ENDOSCOPY;  Service: Gastroenterology;  Laterality: N/A;   ESOPHAGOGASTRODUODENOSCOPY (EGD) WITH PROPOFOL  N/A 03/13/2023   Procedure: ESOPHAGOGASTRODUODENOSCOPY (EGD) WITH PROPOFOL ;  Surgeon: Onita Elspeth Sharper, DO;  Location: [facility] ENDOSCOPY;  Service: Gastroenterology;  Laterality: N/A;   IR IMAGING GUIDED PORT INSERTION  08/09/2021   TONSILLECTOMY  03/17/1955    SOCIAL HISTORY: Social History   Socioeconomic History   Marital status: Divorced    Spouse name: Not on file   Number of children: Not on file   Years of education: Not on file   Highest education level: Not on  file  Occupational History   Not on file  Tobacco Use   Smoking status: Never   Smokeless tobacco: Never  Vaping Use   Vaping status: Never Used  Substance and Sexual Activity   Alcohol use: Not Currently   Drug use: Never   Sexual activity: Not Currently  Other Topics Concern   Not on file  Social History Narrative      Social Drivers of Health   Financial Resource Strain: Low Risk  (05/19/2023)   Received [facility]   Overall Financial Resource Strain (CARDIA)    Difficulty of Paying Living Expenses: Not hard at all  Food Insecurity: No Food Insecurity (05/19/2023)   Received from [facility]   Hunger Vital Sign    Within the past 12 months, you worried that your food would run out before you got the money to buy more.: Never true    Within the past 12 months, the food you bought just didn't last and you didn't have money to get more.: Never true  Transportation Needs: No Transportation Needs (05/19/2023)   Received from [facility] - Transportation    In the past 12 months, has lack of transportation kept you from medical appointments or from getting medications?: No    Lack of Transportation (Non-Medical): No  Physical Activity: Inactive (09/27/2021)   Exercise Vital Sign    Days of Exercise per Week: 0 days    Minutes of Exercise per Session: 0 min  Stress: Stress Concern Present (09/27/2021)   [employer] of Occupational Health - Occupational Stress Questionnaire    Feeling of Stress : Rather much  Social Connections: Moderately Integrated (09/27/2021)   Social Connection and Isolation Panel    Frequency of Communication with Friends and Family: Three times a week    Frequency of Social Gatherings with Friends and Family: Three times a week    Attends Religious Services: 1 to 4 times per year    Active Member of Clubs or Organizations: No    Attends Banker Meetings: 1 to 4 times per year    Marital Status: Divorced  Catering manager Violence: Not At Risk (09/27/2021)   Humiliation, Afraid, Rape, and Kick questionnaire    Fear of Current or Ex-Partner: No    Emotionally Abused: No    Physically Abused: No    Sexually Abused: No    FAMILY HISTORY: Family History  Problem Relation Age of Onset   Breast cancer Mother 10   Cancer Mother    Cancer Father    Lung cancer Father    Breast cancer Cousin        dx 28s maternal    ALLERGIES:  is allergic to doxycycline , omeprazole, and aspirin.  MEDICATIONS:  Current Outpatient Medications  Medication Sig Dispense Refill   acetaminophen  (TYLENOL )  650 MG CR tablet Take 1,300 mg by mouth every 8 (eight) hours as needed for pain.     albuterol  (VENTOLIN  HFA) 108 (90 Base) MCG/ACT inhaler Inhale 2 puffs into the lungs every 6 (six) hours as needed for wheezing or shortness of breath.     azelastine  (ASTELIN ) 0.1 % nasal spray Place 2 sprays into both nostrils daily. Use in each nostril as directed 30 mL 0   Cranberry-Vitamin C-Probiotic (AZO CRANBERRY PO) Take by mouth.     docusate sodium  (COLACE) 100 MG capsule Take 1 capsule (100 mg total) by mouth 2 (two) times daily. 60  capsule 1   empagliflozin (JARDIANCE) 25 MG TABS tablet Take 25 mg by mouth daily.     ferrous sulfate 325 (65 FE) MG EC tablet Take 325 mg by mouth daily with breakfast.     FLUoxetine  (PROZAC ) 10 MG capsule Take 10 mg by mouth daily.     fluticasone  (FLONASE ) 50 MCG/ACT nasal spray Place 1 spray into both nostrils daily as needed for allergies or rhinitis.     furosemide (LASIX) 20 MG tablet Take 20 mg by mouth daily.     gabapentin  (NEURONTIN ) 100 MG capsule Take 1 capsule (100 mg total) by mouth daily. 90 capsule 1   hydrochlorothiazide  (HYDRODIURIL ) 12.5 MG tablet Take 12.5 mg by mouth daily.     Insulin  Asp Prot & Asp FlexPen (NOVOLOG  70/30 MIX) (70-30) 100 UNIT/ML FlexPen Inject into the skin.     lidocaine -prilocaine  (EMLA ) cream Apply small amount to port and cover with saran wrap 1-2 hours prior to port access -daily PRN 30 g 3   lisinopril  (ZESTRIL ) 40 MG tablet Take 40 mg by mouth at bedtime.     loratadine (CLARITIN) 10 MG tablet Take by mouth.     metFORMIN  (GLUCOPHAGE -XR) 500 MG 24 hr tablet Take 500 mg by mouth at bedtime.     metoprolol  succinate (TOPROL -XL) 25 MG 24 hr tablet Take 25 mg by mouth daily.     NOVOLIN 70/30 FLEXPEN (70-30) 100 UNIT/ML KwikPen 50-110 Units See admin instructions. 50 units in the morning, 110 units at bedtime     Omega-3 Fatty Acids (FISH OIL) 300 MG CAPS Take by mouth.     omeprazole (PRILOSEC) 20 MG capsule Take 20 mg by  mouth daily.     OZEMPIC , 1 MG/DOSE, 4 MG/3ML SOPN Inject 1 mg into the skin every Tuesday.     rosuvastatin  (CRESTOR ) 10 MG tablet Take 10 mg by mouth daily.     traMADol  (ULTRAM ) 50 MG tablet Take 2 tablets (100 mg total) by mouth every 6 (six) hours as needed. 90 tablet 0   LORazepam  (ATIVAN ) 0.5 MG tablet Take 1 tablet (0.5 mg total) by mouth every 8 (eight) hours as needed for anxiety (nausea vomiting). 30 tablet 0   No current facility-administered medications for this visit.   Facility-Administered Medications Ordered in Other Visits  Medication Dose Route Frequency Provider Last Rate Last Admin   heparin  lock flush 100 UNIT/ML injection              PHYSICAL EXAMINATION: ECOG PERFORMANCE STATUS: 1 - Symptomatic but completely ambulatory  Physical Exam Constitutional:      General: She is not in acute distress.    Appearance: She is obese.  HENT:     Head: Normocephalic and atraumatic.  Eyes:     General: No scleral icterus. Cardiovascular:     Rate and Rhythm: Normal rate and regular rhythm.     Heart sounds: Murmur heard.  Pulmonary:     Effort: Pulmonary effort is normal. No respiratory distress.  Abdominal:     General: Bowel sounds are normal. There is no distension.     Palpations: Abdomen is soft.  Musculoskeletal:        General: No deformity. Normal range of motion.     Cervical back: Normal range of motion.     Right lower leg: Edema present.     Left lower leg: Edema present.     Comments:    Skin:    General: Skin is warm and dry.  Findings: No rash.     Comments: Chronic venous sufficiency skin changes   Neurological:     Mental Status: She is alert and oriented to person, place, and time. Mental status is at baseline.  Psychiatric:        Mood and Affect: Mood normal.    LABORATORY DATA:  I have reviewed the data as listed    Latest Ref Rng & Units 12/07/2023    8:34 AM 11/17/2023    8:22 AM 10/26/2023    8:10 AM  CBC  WBC 4.0 - 10.5 K/uL  7.6  7.9  7.5   Hemoglobin 12.0 - 15.0 g/dL 88.1  88.2  88.3   Hematocrit 36.0 - 46.0 % 36.5  36.8  36.4   Platelets 150 - 400 K/uL 246  251  252       Latest Ref Rng & Units 12/07/2023    8:34 AM 11/17/2023    8:22 AM 10/26/2023    8:10 AM  CMP  Glucose 70 - 99 mg/dL 854  842  789   BUN 8 - 23 mg/dL 32  35  32   Creatinine 0.44 - 1.00 mg/dL 9.09  9.03  8.89   Sodium 135 - 145 mmol/L 139  139  138   Potassium 3.5 - 5.1 mmol/L 4.0  4.0  4.3   Chloride 98 - 111 mmol/L 107  108  108   CO2 22 - 32 mmol/L 21  18  16    Calcium  8.9 - 10.3 mg/dL 9.6  9.7  89.8   Total Protein 6.5 - 8.1 g/dL 7.1  7.5  7.0   Total Bilirubin 0.0 - 1.2 mg/dL 0.3  0.5  0.3   Alkaline Phos 38 - 126 U/L 52  50  50   AST 15 - 41 U/L 22  24  26    ALT 0 - 44 U/L 14  15  15        RADIOGRAPHIC STUDIES: I have personally reviewed the radiological images as listed and agreed with the findings in the report. CT CHEST ABDOMEN PELVIS WO CONTRAST Result Date: 11/29/2023 CLINICAL DATA:  History of endometrial cancer, status post chemotherapy and radiation * Tracking Code: BO * EXAM: CT CHEST, ABDOMEN AND PELVIS WITHOUT CONTRAST TECHNIQUE: Multidetector CT imaging of the chest, abdomen and pelvis was performed following the standard protocol without IV contrast. RADIATION DOSE REDUCTION: This exam was performed according to the departmental dose-optimization program which includes automated exposure control, adjustment of the mA and/or kV according to patient size and/or use of iterative reconstruction technique. COMPARISON:  08/07/2023 FINDINGS: CT CHEST FINDINGS Cardiovascular: Right chest port catheter. Aortic atherosclerosis. Normal heart size. Three-vessel coronary artery calcifications. No pericardial effusion. Mediastinum/Nodes: No enlarged mediastinal, hilar, or axillary lymph nodes. Thyroid  gland, trachea, and esophagus demonstrate no significant findings. Lungs/Pleura: Unchanged 0.3 cm nodule of the dependent left lower  lobe (series 3, image 65). No pleural effusion or pneumothorax. Musculoskeletal: No chest wall abnormality. No acute osseous findings. CT ABDOMEN PELVIS FINDINGS Hepatobiliary: No solid liver abnormality is seen. Hepatomegaly, maximum coronal span 22.9 cm. Tiny gallstones. No gallbladder wall thickening, or biliary dilatation. Pancreas: Unremarkable. No pancreatic ductal dilatation or surrounding inflammatory changes. Spleen: Normal in size without significant abnormality. Adrenals/Urinary Tract: Unchanged soft tissue attenuation left adrenal nodule measuring 2.7 x 2.3 cm (series 2, image 57). Kidneys are normal, without renal calculi, solid lesion, or hydronephrosis. Bladder is unremarkable. Stomach/Bowel: Stomach is within normal limits. Ascending duodenal diverticulum. Appendix not clearly visualized.  No evidence of bowel wall thickening, distention, or inflammatory changes. Descending and sigmoid diverticulosis. Vascular/Lymphatic: Aortic atherosclerosis. No enlarged abdominal or pelvic lymph nodes. Reproductive: No mass or other abnormality. Other: Diastasis recti with broad-based ventral hernia.  No ascites. Musculoskeletal: No acute osseous findings. IMPRESSION: 1. No noncontrast CT evidence of recurrent or metastatic disease in the chest, abdomen, or pelvis. 2. Unchanged soft tissue attenuation left adrenal nodule measuring 2.7 x 2.3 cm, presumably a benign adenoma. Continued attention on follow-up. 3. Unchanged 0.3 cm nodule of the dependent left lower lobe, almost certainly benign and incidental sequelae of prior infection or inflammation. Continued attention on follow-up. 4. Hepatomegaly. 5. Cholelithiasis. 6. Coronary artery disease. Aortic Atherosclerosis (ICD10-I70.0). Electronically Signed   By: Marolyn JONETTA Jaksch M.D.   On: 11/29/2023 21:34

## 2023-12-07 NOTE — Assessment & Plan Note (Signed)
 lipid poor hyperfunctioning adenoma vs mets.  FDG avid on PET scan.  Difficult biopsy due to patient's body habitus and deep position of the mass. Resolved on subsequent PET scan after treatments, suggesting that this lesion might be a metastatic lesion. CT showed stable lesion  Continue monitor

## 2023-12-08 ENCOUNTER — Other Ambulatory Visit: Payer: Self-pay

## 2023-12-21 ENCOUNTER — Encounter: Payer: Self-pay | Admitting: Oncology

## 2023-12-23 ENCOUNTER — Ambulatory Visit

## 2023-12-30 ENCOUNTER — Telehealth: Payer: Self-pay | Admitting: *Deleted

## 2023-12-30 NOTE — Telephone Encounter (Signed)
 Patient wanted to make sureIf she is still coming over tomorrow at 8 AM for a port flush and then after that a CT scan.  I told the patient that it was not that day at all you are going to be first being seen with the GYN person and that does not start until 10/22 at 9: 30.  He will be getting her port flush on 11/17.  I told her that we are going to print it and send it to her in the mail and she is okay with that

## 2024-01-06 ENCOUNTER — Inpatient Hospital Stay

## 2024-01-06 ENCOUNTER — Inpatient Hospital Stay: Attending: Obstetrics and Gynecology | Admitting: Obstetrics and Gynecology

## 2024-01-06 ENCOUNTER — Other Ambulatory Visit: Payer: Self-pay

## 2024-01-06 ENCOUNTER — Other Ambulatory Visit: Payer: Self-pay | Admitting: *Deleted

## 2024-01-06 ENCOUNTER — Inpatient Hospital Stay: Admitting: Hospice and Palliative Medicine

## 2024-01-06 ENCOUNTER — Encounter: Payer: Self-pay | Admitting: Hospice and Palliative Medicine

## 2024-01-06 VITALS — BP 106/71 | HR 133 | Temp 98.8°F | Resp 18

## 2024-01-06 DIAGNOSIS — Z9221 Personal history of antineoplastic chemotherapy: Secondary | ICD-10-CM | POA: Insufficient documentation

## 2024-01-06 DIAGNOSIS — I951 Orthostatic hypotension: Secondary | ICD-10-CM

## 2024-01-06 DIAGNOSIS — R000 Tachycardia, unspecified: Secondary | ICD-10-CM | POA: Insufficient documentation

## 2024-01-06 DIAGNOSIS — Z79899 Other long term (current) drug therapy: Secondary | ICD-10-CM | POA: Insufficient documentation

## 2024-01-06 DIAGNOSIS — E278 Other specified disorders of adrenal gland: Secondary | ICD-10-CM | POA: Diagnosis not present

## 2024-01-06 DIAGNOSIS — E041 Nontoxic single thyroid nodule: Secondary | ICD-10-CM | POA: Diagnosis not present

## 2024-01-06 DIAGNOSIS — Z923 Personal history of irradiation: Secondary | ICD-10-CM | POA: Diagnosis not present

## 2024-01-06 DIAGNOSIS — E860 Dehydration: Secondary | ICD-10-CM | POA: Insufficient documentation

## 2024-01-06 DIAGNOSIS — C541 Malignant neoplasm of endometrium: Secondary | ICD-10-CM

## 2024-01-06 DIAGNOSIS — Z8542 Personal history of malignant neoplasm of other parts of uterus: Secondary | ICD-10-CM | POA: Insufficient documentation

## 2024-01-06 DIAGNOSIS — R7989 Other specified abnormal findings of blood chemistry: Secondary | ICD-10-CM | POA: Diagnosis not present

## 2024-01-06 DIAGNOSIS — Z08 Encounter for follow-up examination after completed treatment for malignant neoplasm: Secondary | ICD-10-CM | POA: Diagnosis present

## 2024-01-06 LAB — CBC WITH DIFFERENTIAL (CANCER CENTER ONLY)
Abs Immature Granulocytes: 0.06 K/uL (ref 0.00–0.07)
Basophils Absolute: 0 K/uL (ref 0.0–0.1)
Basophils Relative: 1 %
Eosinophils Absolute: 0.2 K/uL (ref 0.0–0.5)
Eosinophils Relative: 3 %
HCT: 35.3 % — ABNORMAL LOW (ref 36.0–46.0)
Hemoglobin: 11.4 g/dL — ABNORMAL LOW (ref 12.0–15.0)
Immature Granulocytes: 1 %
Lymphocytes Relative: 14 %
Lymphs Abs: 0.9 K/uL (ref 0.7–4.0)
MCH: 29.9 pg (ref 26.0–34.0)
MCHC: 32.3 g/dL (ref 30.0–36.0)
MCV: 92.7 fL (ref 80.0–100.0)
Monocytes Absolute: 0.6 K/uL (ref 0.1–1.0)
Monocytes Relative: 9 %
Neutro Abs: 4.8 K/uL (ref 1.7–7.7)
Neutrophils Relative %: 72 %
Platelet Count: 246 K/uL (ref 150–400)
RBC: 3.81 MIL/uL — ABNORMAL LOW (ref 3.87–5.11)
RDW: 13.4 % (ref 11.5–15.5)
WBC Count: 6.6 K/uL (ref 4.0–10.5)
nRBC: 0 % (ref 0.0–0.2)

## 2024-01-06 LAB — CMP (CANCER CENTER ONLY)
ALT: 15 U/L (ref 0–44)
AST: 20 U/L (ref 15–41)
Albumin: 3.9 g/dL (ref 3.5–5.0)
Alkaline Phosphatase: 47 U/L (ref 38–126)
Anion gap: 10 (ref 5–15)
BUN: 51 mg/dL — ABNORMAL HIGH (ref 8–23)
CO2: 21 mmol/L — ABNORMAL LOW (ref 22–32)
Calcium: 10 mg/dL (ref 8.9–10.3)
Chloride: 105 mmol/L (ref 98–111)
Creatinine: 1.46 mg/dL — ABNORMAL HIGH (ref 0.44–1.00)
GFR, Estimated: 38 mL/min — ABNORMAL LOW (ref >60)
Glucose, Bld: 181 mg/dL — ABNORMAL HIGH (ref 70–99)
Potassium: 4.2 mmol/L (ref 3.5–5.1)
Sodium: 136 mmol/L (ref 135–145)
Total Bilirubin: 0.5 mg/dL (ref 0.0–1.2)
Total Protein: 7 g/dL (ref 6.5–8.1)

## 2024-01-06 LAB — TSH: TSH: 1.299 u[IU]/mL (ref 0.350–4.500)

## 2024-01-06 LAB — CORTISOL: Cortisol, Plasma: 13.2 ug/dL

## 2024-01-06 MED ORDER — SODIUM CHLORIDE 0.9 % IV SOLN
INTRAVENOUS | Status: DC
  Filled 2024-01-06 (×2): qty 250

## 2024-01-06 NOTE — Progress Notes (Signed)
 Symptom Management Clinic [facility] [phone] [phone]  Patient Care Team: Sadie Manna, MD as PCP - General (Internal Medicine) Babara Call, MD as Consulting Physician (Oncology)   NAME OF PATIENT: Valerie Case  [id number]  09-Oct-1950   DATE OF VISIT: 01/06/24  REASON FOR CONSULT: Valerie Case is a 73 y.o. 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  Patient's status post XRT due to bleeding.  She has been on maintenance pembrolizumab .  INTERVAL HISTORY: Patient was an add-on to [facility] today for evaluation of orthostasis.  Patient was seeing Dr. Elby with GYN oncology and noted to be hypotensive and orthostatic.  Patient reports occasional dizziness.  She may have had reduced fluid intake over the past couple days.  Denies any fever or chills.  No cough, congestion.  She has had recent shortness of breath but attributes this to asthma and seasonal changes.  No nausea, vomiting, diarrhea.  Denies any neurologic complaints. Denies urinary complaints. Patient offers no further specific complaints today.   PAST MEDICAL HISTORY: Past Medical History:  Diagnosis Date   Anemia    Aortic atherosclerosis    Arthritis    Asthma    B12 deficiency    Cancer (HCC)    Cancer of endometrium (HCC)    Coronary artery disease    Depression 04/01/2021   Diabetes mellitus without complication (HCC)    Diverticulosis    Essential hypertension 04/01/2021   Hemorrhoids    History of rectal bleeding    Hx of dysplastic nevus 07/16/2010   RLQA   Hypertension    Insulin  dependent type 2 diabetes mellitus (HCC) 04/01/2021   Mixed hyperlipidemia    Peripheral polyneuropathy    Sleep apnea     PAST SURGICAL HISTORY:  Past Surgical History:  Procedure Laterality Date   APPENDECTOMY     BIOPSY  03/17/2023   Procedure: BIOPSY;  Surgeon: Onita Elspeth Sharper, DO;  Location: [facility] ENDOSCOPY;  Service: Gastroenterology;;   CESAREAN SECTION  01/23/1979   COLONOSCOPY     COLONOSCOPY N/A 03/17/2023   Procedure: COLONOSCOPY;  Surgeon: Onita Elspeth Sharper, DO;  Location: [facility] ENDOSCOPY;  Service: Gastroenterology;  Laterality: N/A;   COLONOSCOPY WITH PROPOFOL  N/A 03/13/2023   Procedure: COLONOSCOPY WITH PROPOFOL ;  Surgeon: Onita Elspeth Sharper, DO;  Location[facility] ENDOSCOPY;  Service: Gastroenterology;  Laterality: N/A;   ESOPHAGOGASTRODUODENOSCOPY     ESOPHAGOGASTRODUODENOSCOPY N/A 03/17/2023   Procedure: ESOPHAGOGASTRODUODENOSCOPY (EGD);  Surgeon: Onita Elspeth Sharper, DO;  Location: [facility] ENDOSCOPY;  Service: Gastroenterology;  Laterality: N/A;   ESOPHAGOGASTRODUODENOSCOPY (EGD) WITH PROPOFOL  N/A 03/13/2023   Procedure: ESOPHAGOGASTRODUODENOSCOPY (EGD) WITH PROPOFOL ;  Surgeon: Onita Elspeth Sharper, DO;  Location: [facility] ENDOSCOPY;  Service: Gastroenterology;  Laterality: N/A;   IR IMAGING GUIDED PORT INSERTION  08/09/2021   TONSILLECTOMY  03/17/1956    HEMATOLOGY/ONCOLOGY HISTORY:  Oncology History  Endometrial adenocarcinoma (HCC)  12/12/2020 Imaging   12/12/2020, CT abdomen pelvis with contrast showed no radiographic evidence of urinary tract neoplasm, calculi, hydronephrosis.  2.6 cm nonspecific left adrenal mass. 02/12/2021 CT hematuria work-up showed small uterine fibroids, no findings to explain hematuria.Hepatomegaly, diverticulosis without evidence of diverticulitis, Coronary artery disease   04/01/2021 -  Hospital Admission   04/01/2021 - 04/03/2021, patient was hospitalized due to symptomatic anemia, hemoglobin 6.9, heavy postmenopausal bleeding with passing large clots.  She also presented with increased creatinine level to 2.58 compared to her baseline level of 0.9 in November 2022.  Patient received IV iron  infusion and 2 units of PRBC during the hospital stay..  04/02/2021, iron  panel showed iron  saturation 37, ferritin 37,  TIBC 333-the studies were done after patient received blood transfusion.  At discharge, hemoglobin was 7.7.   04/01/2021 Initial Diagnosis   04/01/2020, endometrial biopsy showed poorly differentiated endometrial adenocarcinoma. Omniseq NGS showed TMB 48.4 mt/mb [high], MSI High, PD-L1-TPS <1%, PIK3CA H1047Q, Negative for BRAF, HER2, NTRK1 RET.     04/10/2021 Cancer Staging   Staging form: Corpus Uteri - Carcinoma and Carcinosarcoma, AJCC 8th Edition - Clinical stage from 04/10/2021: FIGO Stage III, calculated as Stage Unknown (cT3, cNX, cM0) - Signed by Babara Call, MD on 08/24/2021 Stage prefix: Initial diagnosis   04/15/2021 Imaging   PET showed 1. Large hypermetabolic endometrial mass consistent with known endometrial cancer. Suspect direct invasion/involvement of the right adnexa. Right pelvic sidewall hypermetabolic adenopathy.2. 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 3. No findings for metastatic disease involving the chest or bonystructures.     04/24/2021 - 05/29/2021 Radiation Therapy   status post radiation to pelvis   08/05/2021 Imaging   MRI abdomen w wo contrast 1. Stable solid enhancing 2.7 cm left adrenal lesion, which was hypermetabolic on prior PET/CTs but is unchanged in size dating back to December 12, 2020. 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 However, unfortunately metastatic disease can not be entirely excluded and remains a pertinent differential consideration. Comparison with more remote prior imaging would be the most valuable tool in the assessment of this lesion, as demonstrating long-term stability would indicate this to be a benign lesion. However, if no prior imaging can be made available, would consider  follow-up adrenal protocol CT with and without intravenous contrast material in 3 months as this would allow for further assessment of stability as well as enhancement and washout characteristics of the lesion potentially allowing for better characterization versus direct tissue sampling.2. Hepatomegaly and hepatic steatosis.3. Colonic diverticulosis without findings of acute diverticulitis    08/16/2021 - 09/27/2021 Chemotherapy   UTERINE Carboplatin  AUC 5 / Paclitaxel  q21d x 3      10/03/2021 Imaging   PET 1. No residual hypermetabolism in the uterus suggesting an excellent response to treatment. No findings for metastatic disease. 2. Resolution of hypermetabolism in the left adrenal gland lesion suggesting this was metastatic disease. 3. Diffuse marrow hypermetabolism likely due to rebound from chemotherapy or marrow stimulating drugs   10/18/2021 - 11/08/2021 Chemotherapy   AUC 5 / Paclitaxel / Keytruda  Q21d  x 2 cycles   11/29/2021 -  Chemotherapy   Patient is on Treatment Plan : UTERINE Pembrolizumab  (200) q21d     04/30/2022 Imaging   CT chest abdomen pelvis w contrast  1. Similar to minimal decrease in size of a left adrenal nodule which was felt suspicious for metastatic disease on 10/03/2021 PET. 2. No evidence of new or progressive disease. 3. Coronary artery atherosclerosis. Aortic Atherosclerosis (ICD10-I70.0). 4. Incidental findings, including: Hepatomegaly. Possible constipation.   08/28/2022 Imaging   CT chest abdomen pelvis w contrast showed 1. Similar appearance of indeterminate left adrenal nodule. 2. Otherwise, no evidence of metastatic disease. 3. Hepatomegaly 4.  Possible constipation. 5. Coronary artery atherosclerosis. Aortic Atherosclerosis (ICD10-I70.0).   12/22/2022 Imaging   CT chest abdomen pelvis w contrast showed 1.  Stable indeterminate left adrenal nodule. 2. Otherwise, no noncontrast enhanced CT evidence of metastatic disease. 3. Colonic diverticulosis  without findings of acute diverticulitis. 4.  Aortic Atherosclerosis (ICD10-I70.0).     04/17/2023 Imaging   CT chest abdomen pelvis wo contrast  1. Similar size of the indeterminate left adrenal nodule which is unchanged back to 06/10/2021. Felt unlikely to represent metastatic disease. Recommend attention on follow-up. 2. Otherwise, no evidence of metastatic disease. 3. Aortic valvular calcifications. Consider echocardiography to evaluate for valvular dysfunction. 4.  Aortic Atherosclerosis (ICD10-I70.0).     ALLERGIES:  is allergic to doxycycline , omeprazole, and aspirin.  MEDICATIONS:  Current Outpatient Medications  Medication Sig Dispense Refill   acetaminophen  (TYLENOL ) 650 MG CR tablet Take 1,300 mg by mouth every 8 (eight) hours as needed for pain.     albuterol  (VENTOLIN  HFA) 108 (90 Base) MCG/ACT inhaler Inhale 2 puffs into the lungs every 6 (six) hours as needed for wheezing or shortness of breath.     azelastine  (ASTELIN ) 0.1 % nasal spray Place 2 sprays into both nostrils daily. Use in each nostril as directed 30 mL 0   Cranberry-Vitamin C-Probiotic (AZO CRANBERRY PO) Take by mouth.     docusate sodium  (COLACE) 100 MG capsule Take 1 capsule (100 mg total) by mouth 2 (two) times daily. 60 capsule 1   empagliflozin (JARDIANCE) 25 MG TABS tablet Take 25 mg by mouth daily.     ferrous sulfate 325 (65 FE) MG EC tablet Take 325 mg by mouth daily with breakfast.     FLUoxetine  (PROZAC ) 10 MG capsule Take 10 mg by mouth daily.     fluticasone  (FLONASE ) 50 MCG/ACT nasal spray Place 1 spray into both nostrils daily as needed for allergies or rhinitis.     furosemide (LASIX) 20 MG tablet Take 20 mg by mouth daily.     gabapentin  (NEURONTIN ) 100 MG capsule Take 1 capsule (100 mg total) by mouth daily. 90 capsule 1   hydrochlorothiazide  (HYDRODIURIL ) 12.5 MG tablet Take 12.5 mg by mouth daily.     Insulin  Asp Prot & Asp FlexPen (NOVOLOG  70/30 MIX) (70-30) 100 UNIT/ML FlexPen Inject  into the skin.     lidocaine -prilocaine  (EMLA ) cream Apply small amount to port and cover with saran wrap 1-2 hours prior to port access -daily PRN 30 g 3   lisinopril  (ZESTRIL ) 40 MG tablet Take 40 mg by mouth at bedtime.     loratadine (CLARITIN) 10 MG tablet Take by mouth.     metFORMIN  (GLUCOPHAGE -XR) 500 MG 24 hr tablet Take 500 mg by mouth at bedtime.     metoprolol  succinate (TOPROL -XL) 25 MG 24 hr tablet Take 25 mg by mouth daily.     NOVOLIN 70/30 FLEXPEN (70-30) 100 UNIT/ML KwikPen 50-110 Units See admin instructions. 50 units in the morning, 110 units at bedtime     Omega-3 Fatty Acids (FISH OIL) 300 MG CAPS Take by mouth.     omeprazole (PRILOSEC) 20 MG capsule Take 20 mg by mouth daily.     OZEMPIC , 1 MG/DOSE, 4 MG/3ML SOPN Inject 1 mg into the skin every Tuesday.     rosuvastatin  (CRESTOR ) 10 MG tablet Take 10 mg by mouth daily.     traMADol  (ULTRAM ) 50 MG tablet Take 2 tablets (100 mg total) by mouth every 6 (six) hours as needed. 90 tablet 0   No current facility-administered medications for this visit.   Facility-Administered Medications Ordered in Other Visits  Medication Dose Route Frequency Provider Last Rate  Last Admin   heparin  lock flush 100 UNIT/ML injection             VITAL SIGNS: BP 106/71 Comment: standing-orthostatic  Pulse (!) 133   Temp 98.8 F (37.1 C) (Tympanic)   Resp 18   SpO2 97%  There were no vitals filed for this visit.  Estimated body mass index is 42.44 kg/m as calculated from the following:   Height as of 03/17/23: 5' 8 (1.727 m).   Weight as of an earlier encounter on 01/06/24: 279 lb 1.6 oz (126.6 kg).  LABS: CBC:    Component Value Date/Time   WBC 6.6 01/06/2024 0958   WBC 7.6 12/07/2023 0834   HGB 11.4 (L) 01/06/2024 0958   HGB 12.7 06/08/2012 1128   HCT 35.3 (L) 01/06/2024 0958   HCT 37.7 06/08/2012 1128   PLT 246 01/06/2024 0958   PLT 310 06/08/2012 1128   MCV 92.7 01/06/2024 0958   MCV 88 06/08/2012 1128   NEUTROABS 4.8  01/06/2024 0958   NEUTROABS 5.9 06/08/2012 1128   LYMPHSABS 0.9 01/06/2024 0958   LYMPHSABS 3.2 06/08/2012 1128   MONOABS 0.6 01/06/2024 0958   MONOABS 0.7 06/08/2012 1128   EOSABS 0.2 01/06/2024 0958   EOSABS 0.4 06/08/2012 1128   BASOSABS 0.0 01/06/2024 0958   BASOSABS 0.1 06/08/2012 1128   Comprehensive Metabolic Panel:    Component Value Date/Time   NA 139 12/07/2023 0834   K 4.0 12/07/2023 0834   CL 107 12/07/2023 0834   CO2 21 (L) 12/07/2023 0834   BUN 32 (H) 12/07/2023 0834   CREATININE 0.90 12/07/2023 0834   CREATININE 1.03 (H) 07/02/2022 1259   GLUCOSE 145 (H) 12/07/2023 0834   CALCIUM  9.6 12/07/2023 0834   AST 22 12/07/2023 0834   AST 24 07/02/2022 1259   ALT 14 12/07/2023 0834   ALT 17 07/02/2022 1259   ALKPHOS 52 12/07/2023 0834   BILITOT 0.3 12/07/2023 0834   BILITOT 0.3 07/02/2022 1259   PROT 7.1 12/07/2023 0834   ALBUMIN 3.9 12/07/2023 0834    RADIOGRAPHIC STUDIES: No results found.  PERFORMANCE STATUS (ECOG) : 1 - Symptomatic but completely ambulatory  Review of Systems Unless otherwise noted, a complete review of systems is negative.  Physical Exam General: NAD Cardiovascular: regular rate and rhythm Pulmonary: clear anterior/posterior fields Abdomen: soft, nontender, + bowel sounds GU: no suprapubic tenderness Extremities: no edema, no joint deformities Skin: no rashes Neurological: Weakness but otherwise nonfocal  IMPRESSION/PLAN: Endometrial cancer -on surveillance.  Patient reportedly cycle 34 of Keytruda  on 12/07/2023.  Orthostasis -initial pressure 146/71, which dropped to 106/71 with standing.  Patient with elevated serum creatinine.  Reduced oral intake suggestive of dehydration.  Will proceed with IV fluids today.  Patient is on several antihypertensive including hydrochlorothiazide .  She does not think that she is regularly taking furosemide, although this has been prescribed in the past.  Suggested that she increase oral fluids,  monitor home BP, and can consider holding hydrochlorothiazide  if needed.  She has follow-up with her PCP in the next 1-2 weeks to discuss further management.   Patient expressed understanding and was in agreement with this plan. She also understands that She can call clinic at any time with any questions, concerns, or complaints.   Thank you for allowing me to participate in the care of this very pleasant patient.   Time Total: 20 minutes  Visit consisted of counseling and education dealing with the complex and emotionally intense issues of symptom management in  the setting of serious illness.Greater than 50%  of this time was spent counseling and coordinating care related to the above assessment and plan.  Signed by: Fonda Mower, [name], NP-C

## 2024-01-06 NOTE — Progress Notes (Signed)
 Gynecologic Oncology Interval Visit   Referring Provider: Dr. Leonce  Chief Complaint: FIGO grade III endometrial adenocarcinoma  Subjective:  Valerie Case is a 73 y.o. female who is seen in consultation from Dr. Leonce for poorly differentiated endometrial adenocarcinoma, FIGO grade III, s/p radiation x 5 weeks to control bleeding, felt to be poor surgical candidate, received chemotherapy who presents to clinic for follow up.   She recently maintenance pembrolizumab . She saw Dr. Babara on 12/07/2023. Her last imaging study was a CT scan on 11/23/2023 as noted below.   IMPRESSION: 1. No noncontrast CT evidence of recurrent or metastatic disease in the chest, abdomen, or pelvis. 2. Unchanged soft tissue attenuation left adrenal nodule measuring 2.7 x 2.3 cm, presumably a benign adenoma. Continued attention on follow-up. 3. Unchanged 0.3 cm nodule of the dependent left lower lobe, almost certainly benign and incidental sequelae of prior infection or inflammation. Continued attention on follow-up. 4. Hepatomegaly. 5. Cholelithiasis. 6. Coronary artery disease.  Thyroid  US  FNA I     - BENIGN (BETHESDA II).       - CONSISTENT WITH BENIGN FOLLICULAR NODULE WITH REPARATIVE CHANGES.    She presents for a pelvic exam.   Gynecologic Oncology History Valerie Case is a pleasant female who is seen in consultation from Dr. Leonce for poorly differentiated endometrial adenocarcinoma, figo grade III. Please see prior notes for complete detail  04/01/21 She was admitted  to [facility] with vaginal bleeding and received 2 units pRBCs and iron  infusion. She underwent endometrial biopsy on which revealed poorly differentiated endometrial adenocarcinoma FIGO III  04/02/2021 BILATERAL LOWER EXTREMITY VENOUS DOPPLER ULTRASOUND No evidence of deep venous thrombosis in either lower extremity.  04/16/2021 PET IMPRESSION: 1. Large hypermetabolic endometrial mass consistent with known endometrial  cancer. Suspect direct invasion/involvement of the right adnexa. Right pelvic sidewall hypermetabolic adenopathy. 2. 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 3. No findings for metastatic disease involving the chest or bony structures.  04/24/2021 Tumor Board Documentation Patient to undergo radiation therapy. Plan to reassess after radiation with imaging to re-evaluate possible surgical intervention.     04/24/2021 - 05/29/2021: She completed WPRT Dose/Fx (Gy): 1.8 #Fx: 25 / 25. Total Dose (Gy): 45  06/12/2021 PET IMPRESSION: 1. Substantial reduction in size and activity of the uterine mass. Substantial reduction in size and activity of the right pelvic sidewall lymph node. 2. The moderately hypermetabolic left adrenal mass is stable in size back through earliest available compare comparison of 12/12/2020, with roughly similar activity level to previous. The density characteristics are not specific for adrenal adenoma. Possibilities include benign and malignant adrenal neoplasms versus metastatic lesion. 3. Other imaging findings of potential clinical significance: Left mastoid effusion. Coronary and aortic atherosclerosis. Systemic atherosclerosis. Mild cardiomegaly. Lax anterior abdominal wall. Suspected cholelithiasis. Sigmoid colon diverticulosis. Congenital anomalies of the C1 vertebra. Anterolisthesis at L4-5.  Hypermetabolic left adrenal mass has been worked up for possible pheochromocytoma which was negative. She was referred to urology and saw Dr. Twylla who recommended CT guided biopsy but this was refused by IR.   She has been followed by Dr. Babara for iron  deficiency anemia secondary to blood loss in setting of endometrial cancer. She received IV iron  and palliative radiation. Bleeding has stopped and hemoglobin has improved to 11.4. Blood sugars have been elevated and last HmgA1c was 7.9  (07/18/21)  08/05/2021 Imaging    MRI abdomen w wo contrast 1. Stable solid  enhancing 2.7 cm left adrenal lesion, which was hypermetabolic on prior PET/CTs but is unchanged in size dating back to December 12, 2020. 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 However, unfortunately metastatic disease can not be entirely excluded and remains a pertinent differential consideration. Comparison with more remote prior imaging would be the most valuable tool in the assessment of this lesion, as demonstrating long-term stability would indicate this to be a benign lesion. However, if no prior imaging can be made available, would consider follow-up adrenal protocol CT with and without intravenous contrast material in 3 months as this would allow for further assessment of stability as well as enhancement and washout characteristics of the lesion potentially allowing for better characterization versus direct tissue sampling.2. Hepatomegaly and hepatic steatosis.3. Colonic diverticulosis without findings of acute diverticulitis      Her case was discussed at Tumor Board. Dr. Mancil felt like she would be a candidate for surgery. After further discussion she opted for non-surgical management.    08/16/2021 - 09/27/2021  completed  3 cycles of carbo-paclitaxel  chemotherapy.   PET 10/03/21  - no residual hypermetabolism of the uterus suggestive of excellent response to treatment. No findings of metastatic disease. Resolution of hypermetabolism of left adrenal lesions. Diffuse marrow hypermetabolism likely secondary to udenyca /GCSF vs chemotherapy.   10/18/2021 - 11/08/2021 completed  2 cycles of carbo-paclitaxel  chemotherapy.  11/29/2021 started on pembrolizumab  200 mg every 3 weeks.   01/22/2022 CT C/A/P IMPRESSION: 1. No findings of progressive  metastatic disease within the chest, abdomen, or pelvis. 2. Left adrenal nodule is similar in size to 10/03/2021 PET. This was felt to be suspicious for treated metastasis on prior PET. 3. Hepatomegaly 4. Coronary artery atherosclerosis. Aortic Atherosclerosis (ICD10-I70.0). 5.  Tiny hiatal hernia.   04/30/2022 Imaging  CT C/A/P    CT chest abdomen pelvis w contrast  1. Similar to minimal decrease in size of a left adrenal nodule which was felt suspicious for metastatic disease on 10/03/2021 PET. 2. No evidence of new or progressive disease. 3. Coronary artery atherosclerosis. Aortic Atherosclerosis (ICD10-I70.0). 4. Incidental findings, including: Hepatomegaly. Possible constipation.   08/28/2022 CT C/A/P IMPRESSION: 1. Similar appearance of indeterminate left adrenal nodule. 2. Otherwise, no evidence of metastatic disease. 3. Hepatomegaly 4.  Possible constipation. 5. Coronary artery atherosclerosis. Aortic Atherosclerosis  12/22/2022. CT C/A/P IMPRESSION: 1. Stable indeterminate left adrenal nodule. 2. Otherwise, no noncontrast enhanced CT evidence of metastatic disease. 3. Colonic diverticulosis without findings of acute diverticulitis. 4.  Aortic Atherosclerosis (ICD10-I70.0).  11/2023 Completed pembrolizumab .  GENETIC TESTING: Genetic counseling performed, patient declined testing  Tumor markers 08/20/21- Omn[employer] NGS showed TMB 48.4 mt/mb [high], MSI High, PD-L1-TPS <1%, PIK3CA H1047Q, Negative for BRAF, HER2, NTRK1 RET. MMR was not reported.   .        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   CKD (chronic kidney disease) stage 3, GFR 30-59 ml/min (HCC) 07/03/2023   Thyroid  nodule 02/16/2023   Neuropathy 11/21/2022   Allergy 08/08/2022   Skin rash 06/06/2022   Encounter for antineoplastic immunotherapy 12/20/2021   Cancer of endometrium (HCC) 11/28/2021   Encounter for antineoplastic chemotherapy 08/24/2021    Morbid obesity with body mass index (BMI) of 40.0 to 44.9 in adult (HCC) 08/24/2021   Anemia in chronic kidney disease (CKD) 08/24/2021   Drug-induced  constipation 08/23/2021   Port-A-Cath in place 08/15/2021   Venous insufficiency of both lower extremities 05/20/2021   Hepatomegaly 04/14/2021   Endometrial adenocarcinoma (HCC) 04/14/2021   Adrenal mass 04/14/2021   Goals of care, counseling/discussion 04/14/2021   B12 deficiency 04/11/2021   AKI (acute kidney injury) 04/01/2021   Essential hypertension 04/01/2021   Insulin  dependent type 2 diabetes mellitus (HCC) 04/01/2021   Depression 04/01/2021   Recent unintentional weight loss over several months 04/01/2021   History of asthma 04/01/2021   Bilateral lower extremity edema 04/01/2021     Past Medical History: Past Medical History:  Diagnosis Date   Anemia    Aortic atherosclerosis    Arthritis    Asthma    B12 deficiency    Cancer (HCC)    Cancer of endometrium (HCC)    Coronary artery disease    Depression 04/01/2021   Diabetes mellitus without complication (HCC)    Diverticulosis    Essential hypertension 04/01/2021   Hemorrhoids    History of rectal bleeding    Hx of dysplastic nevus 07/16/2010   RLQA   Hypertension    Insulin  dependent type 2 diabetes mellitus (HCC) 04/01/2021   Mixed hyperlipidemia    Peripheral polyneuropathy    Sleep apnea     Past Surgical History: Past Surgical History:  Procedure Laterality Date   APPENDECTOMY     BIOPSY  03/17/2023   Procedure: BIOPSY;  Surgeon: Onita Elspeth Sharper, DO;  Location: [facility] ENDOSCOPY;  Service: Gastroenterology;;   CESAREAN SECTION  01/23/1979   COLONOSCOPY     COLONOSCOPY N/A 03/17/2023   Procedure: COLONOSCOPY;  Surgeon: Onita Elspeth Sharper, DO;  Location: [facility] ENDOSCOPY;  Service: Gastroenterology;  Laterality: N/A;   COLONOSCOPY WITH PROPOFOL  N/A 03/13/2023   Procedure: COLONOSCOPY WITH PROPOFOL ;  Surgeon: Onita Elspeth Sharper, DO;   Location: [facility] ENDOSCOPY;  Service: Gastroenterology;  Laterality: N/A;   ESOPHAGOGASTRODUODENOSCOPY     ESOPHAGOGASTRODUODENOSCOPY N/A 03/17/2023   Procedure: ESOPHAGOGASTRODUODENOSCOPY (EGD);  Surgeon: Onita Elspeth Sharper, DO;  Location: [facility] ENDOSCOPY;  Service: Gastroenterology;  Laterality: N/A;   ESOPHAGOGASTRODUODENOSCOPY (EGD) WITH PROPOFOL  N/A 03/13/2023   Procedure: ESOPHAGOGASTRODUODENOSCOPY (EGD) WITH PROPOFOL ;  Surgeon: Onita Elspeth Sharper, DO;  Location: [facility] ENDOSCOPY;  Service: Gastroenterology;  Laterality: N/A;   IR IMAGING GUIDED PORT INSERTION  08/09/2021   TONSILLECTOMY  03/17/1956    Past Gynecologic History:  Post menopausal - 2006 STD: denies   OB History:  OB History  Gravida Para Term Preterm AB Living  1 1 1   1   SAB IAB Ectopic Multiple Live Births      1    # Outcome Date GA Lbr Len/2nd Weight Sex Type Anes PTL Lv  1 Term 1980     CS-Unspec   LIV    Family History: Family History  Problem Relation Age of Onset   Breast cancer Mother 26   Cancer Mother    Cancer Father    Lung cancer Father    Breast cancer Cousin        dx 81s maternal    Social History: Social History   Socioeconomic History   Marital status: Divorced    Spouse name: Not on file   Number of children: Not on file   Years of education: Not on file   Highest education level: Not on file  Occupational History   Not on file  Tobacco Use   Smoking status: Never   Smokeless tobacco: Never  Vaping Use   Vaping status: Never  Used  Substance and Sexual Activity   Alcohol use: Not Currently   Drug use: Never   Sexual activity: Not Currently  Other Topics Concern   Not on file  Social History Narrative      Social Drivers of Health   Financial Resource Strain: Low Risk  (05/19/2023)   Received from [facility]   Overall Financial Resource Strain (CARDIA)    Difficulty of Paying Living Expenses: Not hard at all  Food Insecurity: No Food  Insecurity (05/19/2023)   Received from [facility]   Hunger Vital Sign    Within the past 12 months, you worried that your food would run out before you got the money to buy more.: Never true    Within the past 12 months, the food you bought just didn't last and you didn't have money to get more.: Never true  Transportation Needs: No Transportation Needs (05/19/2023)   Received from [facility] - Transportation    In the past 12 months, has lack of transportation kept you from medical appointments or from getting medications?: No    Lack of Transportation (Non-Medical): No  Physical Activity: Inactive (09/27/2021)   Exercise Vital Sign    Days of Exercise per Week: 0 days    Minutes of Exercise per Session: 0 min  Stress: Stress Concern Present (09/27/2021)   [employer] of Occupational Health - Occupational Stress Questionnaire    Feeling of Stress : Rather much  Social Connections: Moderately Integrated (09/27/2021)   Social Connection and Isolation Panel    Frequency of Communication with Friends and Family: Three times a week    Frequency of Social Gatherings with Friends and Family: Three times a week    Attends Religious Services: 1 to 4 times per year    Active Member of Clubs or Organizations: No    Attends Banker Meetings: 1 to 4 times per year    Marital Status: Divorced  Intimate Partner Violence: Not At Risk (09/27/2021)   Humiliation, Afraid, Rape, and Kick questionnaire    Fear of Current or Ex-Partner: No    Emotionally Abused: No    Physically Abused: No    Sexually Abused: No    Allergies: Allergies  Allergen Reactions   Doxycycline  Hives    Thrush and mouth sores   Omeprazole Other (See Comments)    GI upset and diarrhea    Aspirin Rash    Childhood reaction     Current Medications: Current Outpatient Medications  Medication Sig Dispense Refill   acetaminophen  (TYLENOL ) 650 MG CR tablet Take  1,300 mg by mouth every 8 (eight) hours as needed for pain.     albuterol  (VENTOLIN  HFA) 108 (90 Base) MCG/ACT inhaler Inhale 2 puffs into the lungs every 6 (six) hours as needed for wheezing or shortness of breath.     azelastine  (ASTELIN ) 0.1 % nasal spray Place 2 sprays into both nostrils daily. Use in each nostril as directed 30 mL 0   Cranberry-Vitamin C-Probiotic (AZO CRANBERRY PO) Take by mouth.     docusate sodium  (COLACE) 100 MG capsule Take 1 capsule (100 mg total) by mouth 2 (two) times daily. 60 capsule 1   empagliflozin (JARDIANCE) 25 MG TABS tablet Take 25 mg by mouth daily.     ferrous sulfate 325 (65 FE) MG EC tablet Take 325 mg by mouth daily with breakfast.     FLUoxetine  (PROZAC ) 10 MG capsule Take 10  mg by mouth daily.     fluticasone  (FLONASE ) 50 MCG/ACT nasal spray Place 1 spray into both nostrils daily as needed for allergies or rhinitis.     furosemide (LASIX) 20 MG tablet Take 20 mg by mouth daily.     gabapentin  (NEURONTIN ) 100 MG capsule Take 1 capsule (100 mg total) by mouth daily. 90 capsule 1   hydrochlorothiazide  (HYDRODIURIL ) 12.5 MG tablet Take 12.5 mg by mouth daily.     Insulin  Asp Prot & Asp FlexPen (NOVOLOG  70/30 MIX) (70-30) 100 UNIT/ML FlexPen Inject into the skin.     lidocaine -prilocaine  (EMLA ) cream Apply small amount to port and cover with saran wrap 1-2 hours prior to port access -daily PRN 30 g 3   lisinopril  (ZESTRIL ) 40 MG tablet Take 40 mg by mouth at bedtime.     loratadine (CLARITIN) 10 MG tablet Take by mouth.     metFORMIN  (GLUCOPHAGE -XR) 500 MG 24 hr tablet Take 500 mg by mouth at bedtime.     metoprolol  succinate (TOPROL -XL) 25 MG 24 hr tablet Take 25 mg by mouth daily.     NOVOLIN 70/30 FLEXPEN (70-30) 100 UNIT/ML KwikPen 50-110 Units See admin instructions. 50 units in the morning, 110 units at bedtime     Omega-3 Fatty Acids (FISH OIL) 300 MG CAPS Take by mouth.     omeprazole (PRILOSEC) 20 MG capsule Take 20 mg by mouth daily.      OZEMPIC , 1 MG/DOSE, 4 MG/3ML SOPN Inject 1 mg into the skin every Tuesday.     rosuvastatin  (CRESTOR ) 10 MG tablet Take 10 mg by mouth daily.     traMADol  (ULTRAM ) 50 MG tablet Take 2 tablets (100 mg total) by mouth every 6 (six) hours as needed. 90 tablet 0   No current facility-administered medications for this visit.   Facility-Administered Medications Ordered in Other Visits  Medication Dose Route Frequency Provider Last Rate Last Admin   heparin  lock flush 100 UNIT/ML injection             Review of Systems General:  Fatigue and weakness; light headed HEENT: no complaints Pulmonary: shortness of breath chronic and due to asthma Cardiac: no complaints Gastrointestinal: no complaints Genitourinary/Sexual: no complaints Ob/Gyn: no complaints Musculoskeletal: no complaints Hematology: no complaints Neurologic: peripheral neuropathy    Objective:  Physical Examination:  BP (!) 105/53   Pulse (!) 105   Temp 98.8 F (37.1 C)   Resp 18   Wt 279 lb 1.6 oz (126.6 kg)   SpO2 97%   BMI 42.44 kg/m    GENERAL: Patient walked into clinic visibly short of breath and c/o of light headed and slightly ill appearing. She is in no acute distress HEENT:  Atraumatic and normocephalic. neck supple, mucous membrane slightly dry ABDOMEN:  Soft, nontender. Nondistended. No masses/ascites/hernia/or hepatomegaly.  Lymph node survey: negative NEURO:  Nonfocal. Well oriented.  Appropriate affect.  Pelvic: chaperoned by CMA EGBUS: stable erythema Cervix: palpable cervix smooth on exam with normal consistency. Challenging to see on exam and deviated inferiorly/posteriorly with speculum exam.   Vagina: no lesions, no discharge or bleeding Uterus: not grossly enlarged but limited by habitus BME: no palpable masses Rectovaginal: deferred   Lab Review CMP and CBC to be ordered by Sidra Mower, NP  Radiologic Imaging: 11/29/2023 FINDINGS: CT CHEST FINDINGS   Cardiovascular: Right chest port  catheter. Aortic atherosclerosis. Normal heart size. Three-vessel coronary artery calcifications. No pericardial effusion.   Mediastinum/Nodes: No enlarged mediastinal, hilar, or axillary lymph nodes.  Thyroid  gland, trachea, and esophagus demonstrate no significant findings.   Lungs/Pleura: Unchanged 0.3 cm nodule of the dependent left lower lobe (series 3, image 65). No pleural effusion or pneumothorax.   Musculoskeletal: No chest wall abnormality. No acute osseous findings.   CT ABDOMEN PELVIS FINDINGS   Hepatobiliary: No solid liver abnormality is seen. Hepatomegaly, maximum coronal span 22.9 cm. Tiny gallstones. No gallbladder wall thickening, or biliary dilatation.   Pancreas: Unremarkable. No pancreatic ductal dilatation or surrounding inflammatory changes.   Spleen: Normal in size without significant abnormality.   Adrenals/Urinary Tract: Unchanged soft tissue attenuation left adrenal nodule measuring 2.7 x 2.3 cm (series 2, image 57). Kidneys are normal, without renal calculi, solid lesion, or hydronephrosis. Bladder is unremarkable.   Stomach/Bowel: Stomach is within normal limits. Ascending duodenal diverticulum. Appendix not clearly visualized. No evidence of bowel wall thickening, distention, or inflammatory changes. Descending and sigmoid diverticulosis.   Vascular/Lymphatic: Aortic atherosclerosis. No enlarged abdominal or pelvic lymph nodes.   Reproductive: No mass or other abnormality.   Other: Diastasis recti with broad-based ventral hernia.  No ascites.   Musculoskeletal: No acute osseous findings.   IMPRESSION: 1. No noncontrast CT evidence of recurrent or metastatic disease in the chest, abdomen, or pelvis. 2. Unchanged soft tissue attenuation left adrenal nodule measuring 2.7 x 2.3 cm, presumably a benign adenoma. Continued attention on follow-up. 3. Unchanged 0.3 cm nodule of the dependent left lower lobe, almost certainly benign and incidental  sequelae of prior infection or inflammation. Continued attention on follow-up. 4. Hepatomegaly. 5. Cholelithiasis. 6. Coronary artery disease. 7. Aortic Atherosclerosis (ICD10-I70.0).   Assessment:  Valerie Case is a 73 y.o. female diagnosed with metastatic poorly differentiated endometrial adenocarcinoma (MSI-H) with right sidewall and pelvic lymph node involvement s/p palliative WPRT d/t bleeding, with evidence of completed response based on imaging, and doing well on maintenance pembrolizumab .   Hypotension and tachycardia, uncertain etiology  Thyroid  nodule, benign  Adrenal mass, uncertain etiology. Pheocromocytoma workup was negative. Biopsy recommended by urology but denied by IR. Radiology recommended adrenal MRI. Stable findings. DDX: lipid poor adenoma vs metastatic disease  H/o Renal insufficiency (2.6 on 04/01/2021) improved  Medical co-morbidities complicating care: HTN, AODM, depression, morbid obesity, Chronic venous insufficiency of both lower extremities with h/o cellulitis, History of asthma. Body mass index is 42.44 kg/m.  Plan:   Problem List Items Addressed This Visit       Genitourinary   Endometrial adenocarcinoma (HCC) (Chronic)     Other   Adrenal mass (Chronic)   Other Visit Diagnoses       Orthostatic hypotension         Dehydration           Hypotension and tachycardia, possible dehydration, anemia, or AI. Recommend that she see Dr. Sherleen, NP for further evaluation.   Continue to follow up with Dr. Babara. For continued follow up.   If she does have local recurrence surgery could be considered.   Germline genetic testing declined. May also consider in the future but prefers to hold off for now. She met with Dena Cary 12/04/21.   Continue to follow up with PCP to update her routine cancer screenings if repeat imaging negative.    Follow-up in 4 months for continued surveillance. She prefers female providers.   The patient's  diagnosis, an outline of the further diagnostic and laboratory studies which will be required, the recommendation, and alternatives were discussed.  All questions were answered to the patient's satisfaction.   Dona Klemann  Isidor Constable, MD

## 2024-01-07 LAB — ACTH: C206 ACTH: 41.5 pg/mL (ref 7.2–63.3)

## 2024-01-15 ENCOUNTER — Encounter: Payer: Self-pay | Admitting: Oncology

## 2024-01-16 ENCOUNTER — Other Ambulatory Visit: Payer: Self-pay

## 2024-02-01 ENCOUNTER — Inpatient Hospital Stay: Attending: Obstetrics and Gynecology

## 2024-02-01 DIAGNOSIS — Z8542 Personal history of malignant neoplasm of other parts of uterus: Secondary | ICD-10-CM | POA: Diagnosis present

## 2024-02-01 DIAGNOSIS — Z08 Encounter for follow-up examination after completed treatment for malignant neoplasm: Secondary | ICD-10-CM | POA: Diagnosis present

## 2024-02-23 ENCOUNTER — Other Ambulatory Visit: Payer: Self-pay | Admitting: Internal Medicine

## 2024-02-23 DIAGNOSIS — Z1231 Encounter for screening mammogram for malignant neoplasm of breast: Secondary | ICD-10-CM

## 2024-03-25 ENCOUNTER — Ambulatory Visit
Admission: RE | Admit: 2024-03-25 | Discharge: 2024-03-25 | Disposition: A | Source: Ambulatory Visit | Attending: Internal Medicine | Admitting: Internal Medicine

## 2024-03-25 DIAGNOSIS — Z1231 Encounter for screening mammogram for malignant neoplasm of breast: Secondary | ICD-10-CM | POA: Diagnosis present

## 2024-03-28 ENCOUNTER — Inpatient Hospital Stay: Attending: Obstetrics and Gynecology

## 2024-03-28 DIAGNOSIS — Z452 Encounter for adjustment and management of vascular access device: Secondary | ICD-10-CM | POA: Insufficient documentation

## 2024-03-28 DIAGNOSIS — Z08 Encounter for follow-up examination after completed treatment for malignant neoplasm: Secondary | ICD-10-CM | POA: Insufficient documentation

## 2024-03-28 DIAGNOSIS — Z8542 Personal history of malignant neoplasm of other parts of uterus: Secondary | ICD-10-CM | POA: Insufficient documentation

## 2024-04-12 ENCOUNTER — Telehealth

## 2024-05-11 ENCOUNTER — Inpatient Hospital Stay

## 2024-05-23 ENCOUNTER — Inpatient Hospital Stay

## 2024-05-23 ENCOUNTER — Inpatient Hospital Stay: Attending: Obstetrics and Gynecology

## 2024-05-30 ENCOUNTER — Other Ambulatory Visit

## 2024-06-06 ENCOUNTER — Other Ambulatory Visit

## 2024-06-06 ENCOUNTER — Ambulatory Visit: Admitting: Oncology

## 2024-06-13 ENCOUNTER — Ambulatory Visit: Admitting: Oncology

## 2024-06-13 ENCOUNTER — Other Ambulatory Visit
